# Patient Record
Sex: Female | Born: 1950 | Race: White | Hispanic: No | Marital: Married | Smoking: Former smoker
Health system: Southern US, Community
[De-identification: ages and names within clinical notes are randomized; demographics above are authoritative.]

## PROBLEM LIST (undated history)

## (undated) DIAGNOSIS — Z9889 Other specified postprocedural states: Secondary | ICD-10-CM

## (undated) DIAGNOSIS — M419 Scoliosis, unspecified: Secondary | ICD-10-CM

## (undated) DIAGNOSIS — C50919 Malignant neoplasm of unspecified site of unspecified female breast: Secondary | ICD-10-CM

## (undated) DIAGNOSIS — J449 Chronic obstructive pulmonary disease, unspecified: Secondary | ICD-10-CM

## (undated) DIAGNOSIS — I10 Essential (primary) hypertension: Secondary | ICD-10-CM

## (undated) DIAGNOSIS — J189 Pneumonia, unspecified organism: Secondary | ICD-10-CM

## (undated) DIAGNOSIS — J069 Acute upper respiratory infection, unspecified: Secondary | ICD-10-CM

## (undated) DIAGNOSIS — C801 Malignant (primary) neoplasm, unspecified: Secondary | ICD-10-CM

## (undated) DIAGNOSIS — R05 Cough: Secondary | ICD-10-CM

## (undated) DIAGNOSIS — R112 Nausea with vomiting, unspecified: Secondary | ICD-10-CM

## (undated) HISTORY — PX: BRONCHOSCOPY: SUR163

## (undated) HISTORY — PX: EYE SURGERY: SHX253

## (undated) HISTORY — PX: URETHRA SURGERY: SHX824

---

## 1978-09-16 HISTORY — PX: THYROID SURGERY: SHX805

## 1998-12-21 ENCOUNTER — Other Ambulatory Visit: Admission: RE | Admit: 1998-12-21 | Discharge: 1998-12-21 | Payer: Self-pay | Admitting: Unknown Physician Specialty

## 1999-12-20 ENCOUNTER — Encounter: Payer: Self-pay | Admitting: Obstetrics and Gynecology

## 1999-12-20 ENCOUNTER — Encounter: Admission: RE | Admit: 1999-12-20 | Discharge: 1999-12-20 | Payer: Self-pay | Admitting: Obstetrics and Gynecology

## 1999-12-21 ENCOUNTER — Ambulatory Visit (HOSPITAL_COMMUNITY): Admission: RE | Admit: 1999-12-21 | Discharge: 1999-12-21 | Payer: Self-pay | Admitting: Obstetrics and Gynecology

## 1999-12-21 ENCOUNTER — Encounter: Payer: Self-pay | Admitting: Obstetrics and Gynecology

## 2001-01-06 ENCOUNTER — Other Ambulatory Visit: Admission: RE | Admit: 2001-01-06 | Discharge: 2001-01-06 | Payer: Self-pay | Admitting: Obstetrics and Gynecology

## 2001-01-22 ENCOUNTER — Encounter: Admission: RE | Admit: 2001-01-22 | Discharge: 2001-01-22 | Payer: Self-pay | Admitting: Obstetrics and Gynecology

## 2001-01-22 ENCOUNTER — Encounter: Payer: Self-pay | Admitting: Obstetrics and Gynecology

## 2001-01-28 ENCOUNTER — Encounter: Payer: Self-pay | Admitting: Obstetrics and Gynecology

## 2001-01-28 ENCOUNTER — Encounter: Admission: RE | Admit: 2001-01-28 | Discharge: 2001-01-28 | Payer: Self-pay | Admitting: Obstetrics and Gynecology

## 2002-01-21 ENCOUNTER — Other Ambulatory Visit: Admission: RE | Admit: 2002-01-21 | Discharge: 2002-01-21 | Payer: Self-pay | Admitting: Obstetrics and Gynecology

## 2002-01-29 ENCOUNTER — Encounter: Admission: RE | Admit: 2002-01-29 | Discharge: 2002-01-29 | Payer: Self-pay | Admitting: Obstetrics and Gynecology

## 2002-01-29 ENCOUNTER — Encounter: Payer: Self-pay | Admitting: Obstetrics and Gynecology

## 2002-02-03 ENCOUNTER — Encounter: Payer: Self-pay | Admitting: Obstetrics and Gynecology

## 2002-02-03 ENCOUNTER — Encounter: Admission: RE | Admit: 2002-02-03 | Discharge: 2002-02-03 | Payer: Self-pay | Admitting: Obstetrics and Gynecology

## 2002-04-06 ENCOUNTER — Encounter: Admission: RE | Admit: 2002-04-06 | Discharge: 2002-04-06 | Payer: Self-pay | Admitting: Family Medicine

## 2002-04-06 ENCOUNTER — Encounter: Payer: Self-pay | Admitting: Family Medicine

## 2002-04-19 ENCOUNTER — Encounter: Payer: Self-pay | Admitting: Urology

## 2002-04-19 ENCOUNTER — Ambulatory Visit (HOSPITAL_COMMUNITY): Admission: RE | Admit: 2002-04-19 | Discharge: 2002-04-19 | Payer: Self-pay | Admitting: Urology

## 2002-04-19 ENCOUNTER — Encounter (INDEPENDENT_AMBULATORY_CARE_PROVIDER_SITE_OTHER): Payer: Self-pay | Admitting: *Deleted

## 2002-04-26 ENCOUNTER — Encounter (INDEPENDENT_AMBULATORY_CARE_PROVIDER_SITE_OTHER): Payer: Self-pay | Admitting: *Deleted

## 2002-04-26 ENCOUNTER — Ambulatory Visit (HOSPITAL_COMMUNITY): Admission: RE | Admit: 2002-04-26 | Discharge: 2002-04-26 | Payer: Self-pay | Admitting: Internal Medicine

## 2002-05-05 ENCOUNTER — Ambulatory Visit (HOSPITAL_COMMUNITY): Admission: RE | Admit: 2002-05-05 | Discharge: 2002-05-05 | Payer: Self-pay | Admitting: Urology

## 2002-05-05 ENCOUNTER — Encounter: Payer: Self-pay | Admitting: Urology

## 2002-05-24 ENCOUNTER — Encounter (INDEPENDENT_AMBULATORY_CARE_PROVIDER_SITE_OTHER): Payer: Self-pay

## 2002-05-24 ENCOUNTER — Ambulatory Visit (HOSPITAL_BASED_OUTPATIENT_CLINIC_OR_DEPARTMENT_OTHER): Admission: RE | Admit: 2002-05-24 | Discharge: 2002-05-24 | Payer: Self-pay | Admitting: Urology

## 2002-05-24 ENCOUNTER — Encounter (INDEPENDENT_AMBULATORY_CARE_PROVIDER_SITE_OTHER): Payer: Self-pay | Admitting: Specialist

## 2002-06-14 ENCOUNTER — Encounter (INDEPENDENT_AMBULATORY_CARE_PROVIDER_SITE_OTHER): Payer: Self-pay | Admitting: Specialist

## 2002-06-14 ENCOUNTER — Inpatient Hospital Stay (HOSPITAL_COMMUNITY): Admission: RE | Admit: 2002-06-14 | Discharge: 2002-06-16 | Payer: Self-pay | Admitting: Urology

## 2003-01-17 ENCOUNTER — Other Ambulatory Visit: Admission: RE | Admit: 2003-01-17 | Discharge: 2003-01-17 | Payer: Self-pay | Admitting: Obstetrics and Gynecology

## 2003-01-20 ENCOUNTER — Encounter: Payer: Self-pay | Admitting: Obstetrics and Gynecology

## 2003-01-20 ENCOUNTER — Encounter: Admission: RE | Admit: 2003-01-20 | Discharge: 2003-01-20 | Payer: Self-pay | Admitting: Obstetrics and Gynecology

## 2004-02-18 ENCOUNTER — Emergency Department (HOSPITAL_COMMUNITY): Admission: EM | Admit: 2004-02-18 | Discharge: 2004-02-18 | Payer: Self-pay | Admitting: Emergency Medicine

## 2004-08-17 ENCOUNTER — Ambulatory Visit (HOSPITAL_COMMUNITY): Admission: RE | Admit: 2004-08-17 | Discharge: 2004-08-17 | Payer: Self-pay | Admitting: Gastroenterology

## 2004-08-17 ENCOUNTER — Encounter (INDEPENDENT_AMBULATORY_CARE_PROVIDER_SITE_OTHER): Payer: Self-pay | Admitting: *Deleted

## 2005-01-23 ENCOUNTER — Encounter: Admission: RE | Admit: 2005-01-23 | Discharge: 2005-01-23 | Payer: Self-pay | Admitting: Family Medicine

## 2005-04-05 ENCOUNTER — Other Ambulatory Visit: Admission: RE | Admit: 2005-04-05 | Discharge: 2005-04-05 | Payer: Self-pay | Admitting: Obstetrics and Gynecology

## 2005-05-09 ENCOUNTER — Encounter: Admission: RE | Admit: 2005-05-09 | Discharge: 2005-05-09 | Payer: Self-pay | Admitting: Obstetrics and Gynecology

## 2006-05-12 ENCOUNTER — Encounter: Admission: RE | Admit: 2006-05-12 | Discharge: 2006-05-12 | Payer: Self-pay | Admitting: Obstetrics and Gynecology

## 2006-05-26 ENCOUNTER — Other Ambulatory Visit: Admission: RE | Admit: 2006-05-26 | Discharge: 2006-05-26 | Payer: Self-pay | Admitting: Obstetrics and Gynecology

## 2007-05-14 ENCOUNTER — Encounter: Admission: RE | Admit: 2007-05-14 | Discharge: 2007-05-14 | Payer: Self-pay | Admitting: Obstetrics and Gynecology

## 2007-06-22 ENCOUNTER — Ambulatory Visit (HOSPITAL_COMMUNITY): Admission: RE | Admit: 2007-06-22 | Discharge: 2007-06-22 | Payer: Self-pay | Admitting: Obstetrics and Gynecology

## 2008-01-22 ENCOUNTER — Encounter: Admission: RE | Admit: 2008-01-22 | Discharge: 2008-01-22 | Payer: Self-pay | Admitting: Family Medicine

## 2008-06-02 ENCOUNTER — Encounter: Admission: RE | Admit: 2008-06-02 | Discharge: 2008-06-02 | Payer: Self-pay | Admitting: Obstetrics and Gynecology

## 2009-07-13 ENCOUNTER — Encounter: Admission: RE | Admit: 2009-07-13 | Discharge: 2009-07-13 | Payer: Self-pay | Admitting: Family Medicine

## 2010-10-04 ENCOUNTER — Encounter
Admission: RE | Admit: 2010-10-04 | Discharge: 2010-10-04 | Payer: Self-pay | Source: Home / Self Care | Attending: Obstetrics and Gynecology | Admitting: Obstetrics and Gynecology

## 2010-10-16 ENCOUNTER — Ambulatory Visit: Payer: Self-pay | Admitting: Cardiovascular Disease

## 2011-02-01 NOTE — Op Note (Signed)
NAME:  Valerie Mosley, Valerie Mosley                       ACCOUNT NO.:  [id number]   MEDICAL RECORD NO.:  [id number]                   PATIENT TYPE:  INP   LOCATION:  X005                                 FACILITY:  [facility]   PHYSICIAN:  Mark C. Vernie Ammons, M.D.               DATE OF BIRTH:  March 20, 1951   DATE OF PROCEDURE:  06/14/2002  DATE OF DISCHARGE:                                 OPERATIVE REPORT   PREOPERATIVE DIAGNOSIS:  Left ureteral obstruction.   POSTOPERATIVE DIAGNOSES:  1. Left ureteral obstruction.  2. Pelvic mass.   PROCEDURES:  1. Left distal ureterectomy.  2. Biopsy of pelvic mass.  3. Left ureteral reimplantation.   SURGEON:  Mark C. Vernie Ammons, M.D.   ASSISTANT:  Crecencio Mc, MD   ANESTHESIA:  General.   ESTIMATED BLOOD LOSS:  700 cc.   INTRAVENOUS FLUIDS:  3500 cc.   INTRAOPERATIVE FINDINGS:  Left distal ureteral margin was negative for any  malignancy.  Frozen section biopsies of pelvic mass revealed benign  endometrial tissue.   COMPLICATIONS:  None.   INDICATION:  Ms. Dolliver is a 60 year old white female, who was initially  evaluated after an incidental finding of asymptomatic left-sided  hydronephrosis.  The patient subsequently had a CAT scan which revealed left  hydronephrosis and a dilated ureter down to the distal ureter.  However,  there were no masses identified that would suggest extrinsic compression.  The patient therefore underwent left ureteroscopy and multiple biopsies and  brushings demonstrated no evidence of malignancy.  In addition, a renogram  was performed to assess function of the kidney, and it revealed  approximately 20%. of relative renal function on the left.  After discussing  options, it was decided to proceed with left distal ureterectomy and  reimplantation.  Potential risks and benefits of this procedure were  explained to the patient, and she consented.   DESCRIPTION OF PROCEDURE:  The patient was taken to the operating room, and  a general anesthetic was administered.  The patient was given preoperative  antibiotics, placed in the supine position but slightly frog-legged.  Her  lower abdomen and genitalia were then prepped and draped in the usual  sterile fashion.  Next, a 24 French Foley catheter was inserted into the  bladder, and the bladder was drained.  A lower abdominal midline incision  was made from a point just below the umbilicus down to the pubic symphysis.  This was then carried down through the subcutaneous tissues with Bovie  electrocautery.  The anterior rectus fascia was identified and divided in  the midline.  The rectus muscles were then separated, and the underlying  transversalis fascia was entered thereby exposing the space of Retzius which  was developed bluntly on the left side of the bladder.  The large dilated  left ureter was then identified as it coursed over the iliac vessels.  A  right-angle was able to be used to  isolate the ureter, and a Vesi-loop was  placed underneath it.  The ureter was then freed up down to the  ureterovesical junction.  It was noted to be involved with a hard mass down  at the level of the UVJ.  The mass was palpated intraoperatively appeared  hard, irregular, and measuring approximately 4 x 3 cm.  After dissection of  the ureter using blunt and sharp dissection as necessary, it was seen that  the ureter was partly involved in this mass, although most of this mass  appeared to be posterior to the ureter.  Although the mass was firm, it did  appear to be mobile.  Previous pelvic examinations had been negative for any  masses.  However, due to this intraoperative finding, it was decided to  perform another bimanual exam.  Intravaginal exam did reveal evidence of an  irregular mass in the left fornix of the vagina just adjacent to the cervix.  This mass did appear to involve the vaginal mucosa.  A rectal exam was also  performed, and there did not appear to be any  rectal masses noted.  It was  therefore decided to perform a distal ureteral resection at this point as  well as a biopsy of this pelvic mass.  Therefore, the distal ureter was  transected at a point proximal to the aforementioned mass.  A distal  ureteral margin was sent for frozen section pathology.  An anterior  cystotomy incision was then made in the bladder, and the left ureteral  orifice was identified.  A 6 French end-hole catheter was passed up the left  ureter over a Glidewire, and a 4-0 chromic holding stitch was placed through  the ureter and the stent.  The mucosa surrounding the ureteral orifice was  then scored with Bovie electrocautery.  The ureter was then dissected free  from the bladder until it was completely freed from the bladder.  The  aforementioned mass could be seen to be involving the posterior ureter at a  point just proximal to the UVJ.  It was decided to remove the ureter as well  as the top portion of this mass which was sent for permanent section.  A  separate wedge biopsy was then taken of the underlying pelvic mass and sent  for frozen section.  The frozen section of the distal ureter returned  negative for malignancy.  In addition, the frozen section biopsy of the  pelvic mass revealed benign endometrial tissue.  At this point, it was  decided to obtain a telephone consultation from gynecology regarding the  appropriate next step.  It was decided that if this did appear to be benign  endometrial tissue, possibly suggesting endometriosis, no further surgical  resection was necessary at this time.  Therefore, it was decided to proceed  with left ureteral reimplantation.  The left ureteral orifice hiatus was  closed in two layers with a running 2-0 chromic and then a running 3-0  chromic suture.  A site superior to the native ureteral orifice was then  selected for reimplantation.  The bladder mucosa was scored, and a tonsil was passed through the detrusor  muscle.  The ureter was then brought through  this neoureteral hiatus, and ureteral reimplantation was performed.  Due to  the dilation of the ureter, it was not necessary to spatulate the distal  ureter.  A full-thickness 4-0 chromic suture was placed at the inferior  portion of the ureter to properly align it in its  new site in the bladder.  A second full-thickness 4-0 chromic suture was also placed just next to the  initial stitch.  Then 4-0 chromic interrupted sutures were then used to  perform the ureteral reimplantation intravesically.  There appeared to be  good urine effluxing from the ureter following completion of the  anastomosis.  In addition, urine was seen from the right ureteral orifice as  well.  The cystotomy incision was then closed in two layers with two running  2-0 chromic sutures.  The bladder was then irrigated, and the bladder  appeared watertight.  There also did not appear to be any clots in the  bladder.  A #10 Blake drain was then placed in the pelvis and brought out  through a separate stab incision in the left lower quadrant and secured with  a silk suture.  The fascia was then closed with a running #1 PDS suture.  Staples were used to reapproximate the skin, and a dressing was placed.  There were no complications, and the patient appeared to tolerate the  procedure well.  She was able to be extubated and transferred to the  recovery unit is satisfactory condition.  Please note that Dr. Ihor Gully  was present and participated in the entire procedure.     Crecencio Mc, M.D.                          Veverly Fells. Vernie Ammons, M.D.   LB/MEDQ  D:  06/14/2002  T:  06/14/2002  Job:  [id number]   cc:   Talmadge Coventry, M.D.

## 2011-02-01 NOTE — Discharge Summary (Signed)
NAME:  Valerie Mosley, Valerie Mosley                       ACCOUNT NO.:  [id number]   MEDICAL RECORD NO.:  [id number]                   PATIENT TYPE:  INP   LOCATION:  0358                                 FACILITY:  [facility]   PHYSICIAN:  Mark C. Vernie Ammons, M.D.               DATE OF BIRTH:  1951-08-25   DATE OF ADMISSION:  06/14/2002  DATE OF DISCHARGE:  06/16/2002                                 DISCHARGE SUMMARY   PRINCIPAL DIAGNOSIS:  Left hydronephrosis and ureteral obstruction secondary  to endometriosis.   OTHER DIAGNOSES:  1. Hypothyroidism.  2. Mild stress incontinence.   MAJOR OPERATION:  Left distal ureterectomy, biopsy of pelvic mass, and  ureteral reimplantation.   DISPOSITION:  The patient is discharged home in stable, satisfactory, and  improved condition.   FOLLOW-UP:  Her follow-up will be in my office approximately one week from  her date of surgery for skin staple and Foley catheter removal after  cystogram.   ACTIVITY:  Will be limited to no heavy lifting, straining, vigorous  activity, or up and down stairs, or driving.   DIET:  Unrestricted.   DISCHARGE MEDICATIONS:  1. Tylox for pain #36.  2. Ditropan XL 15 mg for bladder spasms q.d. as needed.  3. Colace 100 mg b.i.d. p.r.n.  4. Cipro XR 500 mg q.d.   BRIEF HISTORY:  The patient is a 60 year old white female who was seen in  office consultation after hydronephrosis was found on a CT scan that was  scanned through the tops of the kidneys checking for source of hemoptysis.  This was followed up by a CT which revealed hydronephrosis and hydroureter  down to the UVJ, but no mass could be identified in that region.  She had  retrograde pyelograms revealing what appeared to be a filling defect and  obstruction, followed by two sets of biopsies of the lesion, both which were  negative, __________ urine which was essentially negative, as were brush  biopsies.  She had dilatation of the stricture of the area and,  despite a  large 14-French stent, once it was removed she redeveloped hydronephrosis  quickly.  She, therefore, is admitted for distal left ureterectomy to help  determine the etiology of her obstruction and reimplantation.  She did have  a renal scan to assess function, which only revealed 20% of function on the  left-hand side; however, there appeared to be excellent renal parenchyma  present indicating possible further recovery once obstruction was resolved.   Her complete history and physical are previously dictated and will not be  repeated at this time.   HOSPITAL COURSE:  On June 14, 2002, she was taken to the OR and  underwent a left distal ureterectomy.  At the time of surgery a pelvic mass  was identified, and a biopsy of this mass revealed benign and endometrial  tissue.  Intraoperative consultation was obtained by telephone with Dr.  Elana Alm  since the patient's gynecologist was not known.  The feeling was  that if this represented endometriosis, which it most likely did, during  reimplantation in this postmenopausal patient should be curetted and no  further resection of the lesion indicated.  She tolerated the procedure well  with no need for transfusion and the night of surgery was found to be doing  well.  Her urine was slightly pink but had no clots.  The following day she  remained afebrile, with good urine output.  The pelvic drain was left in  place and had minimal output.  Her diet was advanced, and by her second  postoperative day, her drain output had decreased, and the drain was  removed.  Her urine was partially clear and had no clots.  Her wound was  healing well, with no signs of infection.  Her pathology revealed no  abnormality of the distal ureter.  The distal ureteral margin and the  biopsies of the pelvic mass revealed no malignancy but endometriosis only.  The segment of left distal ureter was found to contain endometriosis as the  cause of her  obstruction.  She was felt ready for discharge at that time  and, after discussing her pathology report with her, she was discharged  home.                                               Mark C. Vernie Ammons, M.D.    MCO/MEDQ  D:  06/25/2002  T:  06/25/2002  Job:  [id number]

## 2011-02-01 NOTE — Op Note (Signed)
NAME:  Valerie Mosley, Valerie Mosley NO.:  [id number]   MEDICAL RECORD NO.:  [id number]          PATIENT TYPE:  AMB   LOCATION:  ENDO                         FACILITY:  [facility]   PHYSICIAN:  Anselmo Rod, M.D.  DATE OF BIRTH:  Jan 27, 1951   DATE OF PROCEDURE:  08/17/2004  DATE OF DISCHARGE:                                 OPERATIVE REPORT   PROCEDURE:  Colonoscopy with snare polypectomy x 3 and cold biopsies x 6.   ENDOSCOPIST:  Charna Elizabeth, M.D.   INSTRUMENT USED:  Olympus video colonoscope.   INDICATIONS FOR PROCEDURE:  60 year old white female undergoing screening  colonoscopy to rule out colonic polyps, masses, etc.  The 10% missed rate  including all the risks and benefits of the procedure were discussed with  the patient.   PREPROCEDURE PREPARATION:  Informed consent was obtained from the patient.  The patient was fasted for eight hours prior to the procedure and prepped  with a bottle of magnesium citrate and a gallon of GoLYTELY the night prior  to the procedure.   PREPROCEDURE PHYSICAL:  Patient with stable vital signs.  Neck supple.  Chest clear to auscultation.  S1 and S2 regular.  Abdomen soft with normal  bowel sounds.   DESCRIPTION OF PROCEDURE:  The patient was placed in the left lateral  decubitus position, sedated with 80 mg of Demerol and 10 mg Versed in slow  incremental doses.  Once the patient was adequately sedated, maintained on  low flow oxygen and continuous cardiac monitoring, the Olympus video  colonoscope was advanced from the rectum to the cecum.  The appendiceal  orifice and ileocecal valve were clearly visualized and photographed.  There  was a significant amount of residual stool in the colon and the prep was  suboptimal.  Multiple washes were done.  The terminal ileum appeared healthy  and without lesions.  Two small sessile polyps were snared from the  rectosigmoid colon, one polyp was biopsied from the rectosigmoid colon.  A  small  flat polyp was snared from the rectum, five small sessile polyps were  biopsied from the rectum, as well.  Small internal hemorrhoids were seen on  retroflexion.  There was evidence of diverticula throughout the colon with  more prominent changes in the left colon.  Inspissated stool was seen in  several of the diverticula in the left colon.  The patient tolerated the  procedure well without complications.   IMPRESSION:  1.  Pandiverticulosis, left more than right.  2.  Multiple polyps snared and biopsied from the rectosigmoid colon (see      description above).  3.  Small internal hemorrhoids.  4.  Normal terminal ileum.   RECOMMENDATIONS:  1.  Await pathology results.  2.  Avoid nonsteroidals including aspirin for the next four weeks.  3.  Outpatient follow in the next two weeks for further recommendations.      Brochures on diverticulosis have been given to the patient for      education.  Further recommendations will be made in follow up.      Jyot   JNM/MEDQ  D:  08/17/2004  T:  08/17/2004  Job:  [id number]   cc:   Talmadge Coventry, M.D.  [address]  [place]  [place] [address]  Fax: [phone]   Huel Cote, M.D.  [address] [place], [address]  [place], [place] [address]  Fax: [phone]

## 2011-02-01 NOTE — H&P (Signed)
NAME:  Valerie Mosley, SCHNURR                       ACCOUNT NO.:  [id number]   MEDICAL RECORD NO.:  [id number]                   PATIENT TYPE:  INP   LOCATION:  NA                                   FACILITY:  [facility]   PHYSICIAN:  Mark C. Vernie Ammons, M.D.               DATE OF BIRTH:  September 02, 1951   DATE OF ADMISSION:  06/14/2002  DATE OF DISCHARGE:                                HISTORY & PHYSICAL   HISTORY OF PRESENT ILLNESS:  The patient is a 60 year old white female  patient who was seen in office consultation for further evaluation of an  abnormality of her left ureter found incidentally during workup for  hemoptysis.  A CT was performed for that, and included the kidneys which  revealed left hydronephrosis.  A followup ultrasound and CT scan confirmed a  completely asymptomatic left hydronephrosis.  The patient has not had any  hematuria.  She had one urinary tract infection in her 20's, and denies any  irritative or obstructive voiding symptoms.  She has mild stress  incontinence.  On review of her CT and ultrasound I noted dilatation of the  renal pelvis which was noted to be extra-renal, with what I thought was  thinning of the cortex initially.  In order to evaluate this, she was  brought to the OR and had a retrograde pyelogram and ureteroscopy.  I did  biopsies and washings, but the biopsies showed no evidence of urothelial  malignancy, and the washings showed some atypia with numerous papillary  groups with some cytologic atypia, suspicious, but not diagnostic for a  papillary urothelial process.  I did find an area in the distal ureter that  appeared to be heaped up, but was covered with what appeared to be normal  mucosa.  I had left an ureteral stent in place, and performed a renal  ultrasound that showed resolution of her hydronephrosis and good parenchyma  measuring 9.2 mm in width.  With the stent in place, I performed a MAG-3  renogram which revealed 20% of function on the  left, however, by ultrasound  she appeared to have a much better appearing kidney.  I therefore offered  her excision of the distal ureter and reimplantation as I thought she would  likely re-obstruct, however, she wanted to be absolutely sure that was  necessary, so I took her back to the OR, repeated biopsies, and did much  more extensive brushing and barbotraging of the ureter, but this again did  not turn up any definitely malignancy.  I dilated the distal ureter, and I  put in a 14 French endopyelotomy stent.  When I removed her endopyelotomy  stent I obtained an ultrasound which showed no hydronephrosis, and then when  she returned her hydronephrosis had recurred.  Because she continues to  obstruct due to some distal ureteral process as yet to be determined as far  as the etiology, we discussed  distal ureterectomy and reimplantation of the  ureter since I do feel that she has enough renal function worthy of salvage.  She is admitted today for that procedure.   PAST MEDICAL HISTORY:  Partial thyroidectomy, and as above.  The partial  thyroidectomy resulted in some mild hypothyroidism for which she takes  Synthroid at 0.05 mg, and she is on Estrogen replacement using Prempro 2.5  mg q.d.  She is otherwise in good health, and has some mild stress  incontinence.   ALLERGIES:  No known drug allergies.   SOCIAL HISTORY:  She smoked 1 pack of cigarettes a day for 20 years, and  continues to do so, and occasionally drinks beer.   FAMILY HISTORY:  Father died of cancer at age 41, mother of an aneurysm at  67, and there is prostate cancer in her family.   REVIEW OF SYMPTOMS:  Positive for some vaginal bleeding, incontinence,  cough, tired, and sluggish, and otherwise per health history section in her  chart which was reviewed and signed.   PHYSICAL EXAMINATION:  GENERAL:  The patient is a well-developed, well-  nourished white female in no acute distress.  VITAL SIGNS:  Temperature is  99.4, pulse 74, respirations 16, blood pressure  104/72.  HEENT:  Normocephalic, atraumatic.  Oropharynx is clear.  NECK:  Supple without JVD or adenopathy.  CHEST:  Clear to auscultation.  CARDIOVASCULAR:  Regular rate and rhythm.  ABDOMEN:  Soft and nontender without masses or hepatosplenomegaly.  She had  no inguinal hernias or adenopathy.  No flank tenderness.  GENITALIA:  Normal with normally placed urethral meatus and mild grade I  cystocele, no rectocele or masses, no introital lesions.  The bladder base  had no palpable abnormalities or tenderness.  No periurethral lesions.  Cervix is palpably normal with a normal anus and perineum.  EXTREMITIES:  Without cyanosis, clubbing, or edema.  SKIN:  Warm and dry.  NEUROLOGIC:  She is alert and oriented with appropriate mood and affect with  no gross focal neurologic deficits.   LABORATORY DATA:  Hemoglobin is 11.6, hematocrit 40.   Chest x-ray revealed atelectasis at the left lung base with some chronic  interstitial lung disease, otherwise negative.   IMPRESSION:  1. Distal left ureteral obstruction suspicious, but she has not had any     diagnosis of transitional cell carcinoma.  She is at risk because of her     history of smoking.  Regardless of the fact that I have not been able to     diagnose malignancy in causing the obstruction, she has a salvageable     left renal unit, as far as function goes, and needs reimplantation, as     she does not want to have a chronic indwelling stent requiring stent     changes.  We have discussed this at length, and she understands and would     like to proceed with this.  I have gone over the risks and complications     with her, and she understands these as well.  2. She has mild stress urinary incontinence and a mild cystocele of no real     significance at this time.   PLAN: 1. Routine deep vein thrombosis and pulmonary embolism prophylaxis.  2. Perioperative antibiotics.  3. She is  going to undergo distal left ureterectomy with excision of the     distal ureteral segment and reimplantation.  Mark C. Vernie Ammons, M.D.    MCO/MEDQ  D:  06/14/2002  T:  06/14/2002  Job:  [id number]   cc:   Talmadge Coventry, M.D.

## 2011-02-01 NOTE — Op Note (Signed)
NAME:  Valerie Mosley, Valerie Mosley                       ACCOUNT NO.:  [id number]   MEDICAL RECORD NO.:  [id number]                   PATIENT TYPE:  AMB   LOCATION:  ENDO                                 FACILITY:  [facility]   PHYSICIAN:  Clinton D. Maple Hudson, M.D.              DATE OF BIRTH:  [birth year]   DATE OF PROCEDURE:  04/26/2002  DATE OF DISCHARGE:  04/26/2002                                 OPERATIVE REPORT   PROCEDURE:  Bronchoscopy.   INDICATIONS:  A one pack per day smoker with new-onset hemoptysis.  Chest x-  ray and CT scan show hazy infiltrate left upper lobe and left base without  effusion, mass, or other significant pathology.  Possible mild atelectasis,  left base.  Medical history:  Hypertension, hypothyroidism.  Question of  partial obstruction, left kidney.  Preoperative physical examination:  BP  110/80, weight 129 pounds.  Oxygen saturation on room air 96%.  Moderate  obstructive airway disease indicated by pulmonary function tests.  There  were mild rhonchi, especially in the left midlung zone.  Heart sounds were  normal.  No adenopathy, obvious hemorrhage, or other abnormalities  recognized.  Medication:  Advil, Synthroid, Prempro.  No medication allergy.  She is considered stable for the procedure, intending to look for cause of  hemoptysis.   DESCRIPTION OF PROCEDURE:  After fully informed consent, bronchoscopy was  performed in the endoscopy suite on an outpatient basis.  Premedication was  with Demerol and atropine.  The upper airway was anesthetized topically with  Hurricaine spray and 1%  Xylocaine.  Oxygen was provided at 8 L/min. by  nasal prongs, holding saturation over 90%.  Cardiac monitor showed normal  sinus rhythm.  An Olympus fiberoptic bronchoscope was advanced via the right  nostril to the level of the vocal cords without difficulty.  The cords moved  normally.  The arytenoid cartilages were distinctly red, raising question of  reflux.  Otherwise, the  nasopharynx and larynx were unremarkable.  The  trachea and main carina were normal.  Sequentially the examination of each  lobar and segmental airway bilaterally to the fourth division level revealed  no endobronchial lesions.  There were scattered areas of retained clear  mucoid secretion but the bronchial mucosa was only  minimal erythematous,  suggesting some areas of mild bronchitis.  No blood was seen and no  potential bleeding source recognized.  The left apex and left lower lobe  were lavaged with saline and brushed for cytology.  No biopsies were  attempted.  The bronchoscope was then removed.  There were no apparent  complications, and she seemed to be doing well.  She will be held until  stable for recovery and then return home per the family to office follow-up.   FINAL IMPRESSION:  Bronchitis with hemoptysis.  Clinton D. Maple Hudson, M.D.    CDY/MEDQ  D:  04/26/2002  T:  04/28/2002  Job:  [id number]   cc:   Talmadge Coventry, M.D.

## 2011-02-01 NOTE — Op Note (Signed)
TNAMEFREDRICA, Valerie Mosley                      ACCOUNT NO.:  [id number]   MEDICAL RECORD NO.:  [id number]                   PATIENT TYPE:  AMB   LOCATION:  DAY                                  FACILITY:  [facility]   PHYSICIAN:  Mark C. Vernie Ammons, M.D.               DATE OF BIRTH:  [birth year]   DATE OF PROCEDURE:  04/19/2002  DATE OF DISCHARGE:                                 OPERATIVE REPORT   PREOPERATIVE DIAGNOSIS:  Left hydronephrosis.   POSTOPERATIVE DIAGNOSES:  1. Left hydronephrosis.  2. Left ureteral stricture.   PROCEDURE:  Cystoscopy, left retrograde pyelogram with interpretation, left  ureteroscopy with dilation of ureteral stricture and ureteral biopsy  including double J stent placement.   SURGEON:  Mark C. Vernie Ammons, M.D.   ANESTHESIA:  General.   DRAINS:  6 French 26 cm double J stent in the left ureter.   SPECIMENS:  Urine from left ureter sent for cytology and ureteral biopsy.   ESTIMATED BLOOD LOSS:  Less than 1 cc.   COMPLICATIONS:  None.   INDICATIONS FOR PROCEDURE:  The patient is a 60 year old white female who,  during her workup for hemoptysis, was found to have left hydronephrosis.  This was followed up and found to be associated with significant  hydronephrosis the presence of functioning renal parenchyma on that side  with dilation of the ureter down to the level of the UVJ. No extrinsic mass  could be identified. She was brought to the OR today for further  investigation of the cause of her obstruction and treatment. She understands  the risks, complications, alternatives and limitations.   DESCRIPTION OF PROCEDURE:  After informed consent, the patient was brought  to the major OR, placed on the table, administered general anesthesia and  then moved to the dorsal lithotomy position. Her genitalia was sterilely  prepped and draped and I initially inserted the 22 French rigid cystoscope  with the 12 degree lens and fully inspected the bladder and noted  the  bladder to be lined with normal pink mucosa that was free of any tumor,  stones or inflammatory lesions. Specifically the ureteral orifices appeared  symmetric in placement and configuration.   An 8 [place] cone-tip ureteral catheter was then passed through the  cystoscope and a retrograde pyelogram was performed under direct  fluoroscopic control. It revealed what appeared to be a segment of normal  ureter for about 2-3 cm followed by an area that appeared to almost have a  filling defect type of configuration, some slight dilatation above that  followed by marked hydronephrosis above that region. I then removed the cone-  tip ureteral catheter and passed a guidewire easily up through the area into  the area of the renal pelvis. Over the guidewire, a 10 cm length 15 French  ureteral dilating balloon was placed across the area and inflated to 14  atmospheres and remained inflated for five minutes. I then  deflated the  balloon, removed the ballon and guidewire and then inserted the 7 French  rigid short ureteroscope. What I noted was irritated mucosa as to be  expected with the dilatation and what appeared to be a nodule that  corresponded to the area of greatest stricture during balloon inflation. As  I inflated the balloon, I could see one area that opened up at the very  highest pressure and this area corresponded to what I saw ureteroscopically  as a small nodular appearing lesion that had no papillary component and  appeared to be covered with normal appearing mucosa. I therefore obtained  urine from the ureter and sent that for cytology. I then attempted to biopsy  the area with a 3 French biopsy forceps but I was unsuccessful so I removed  the 7 French ureteroscope and inserted a 9.5 French rigid ureteroscope. I  was able to easily identify the area and biopsied this. No bleeding was  noted after performing this biopsy and I then passed a guidewire up the  ureter into the pelvis  under fluoroscopic control and then reloaded the  cystoscope over the guidewire and passed the stent over the guidewire with  good curl being noted in the renal pelvis and bladder. The bladder was then  drained, the patient was awakened and taken to the recovery room in stable  satisfactory condition. She received a B&O suppository before being awakened  and tolerated the procedure well with no intraoperative complications.                                               Mark C. Vernie Ammons, M.D.    MCO/MEDQ  D:  04/19/2002  T:  04/23/2002  Job:  [id number]

## 2011-02-01 NOTE — Op Note (Signed)
Valerie Mosley, Valerie Mosley                      ACCOUNT NO.:  [id number]   MEDICAL RECORD NO.:  [id number]                   PATIENT TYPE:  AMB   LOCATION:  [facility]                                 FACILITY:  [facility]   PHYSICIAN:  Mark C. Vernie Ammons, M.D.               DATE OF BIRTH:  [birth year]   DATE OF PROCEDURE:  05/24/2002  DATE OF DISCHARGE:                                 OPERATIVE REPORT   PREOPERATIVE DIAGNOSIS:  Left ureteral obstruction with hydronephrosis and  stricture versus filling defect.   POSTOPERATIVE DIAGNOSES:  Left ureteral obstruction with hydronephrosis and  stricture versus filling defect.   PROCEDURE:  Cystoscopy, left retrograde pyelogram with interpretation, left  ureteroscopy, ureteral dilatation and biopsy of the ureteral lesion with  double J stent placement.   SURGEON:  Mark C. Vernie Ammons, M.D.   ANESTHESIA:  General.   DRAINS:  A 24 cm 7/14 endopyelotomy stent with string.   ESTIMATED BLOOD LOSS:  Approximately 1 cc.   SPECIMENS:  1. Left ureteral cold cup biopsies.  2. Left ureteral brush biopsy.  3. Left ureteral barbotage for cytology.   COMPLICATIONS:  None.   INDICATIONS FOR PROCEDURE:  The patient is a 60 year old white female found  to have incidental left hydronephrosis and evaluated previously with  ureteroscopy and biopsy of what appeared to be a filling defect in the  distal left ureter. I left the stent in after that procedure and all of the  pathologic studies revealed no malignancy. There was a question of some  urothelial atypia on cytology. She is brought back to the OR for  reevaluation, further biopsy with a larger scope now that she has had a  stent in and placement of a larger stent.   DESCRIPTION OF PROCEDURE:  After informed consent, the patient brought to  the major OR, placed on the table and administered general endotracheal  anesthesia and moved to the dorsal lithotomy position. Her genitalia was  sterilely prepped and  draped and initially the cystoscope was introduced in  the bladder, the bladder was noted to be free of any tumor, stones or  inflammatory lesions. The stent was noted to be exiting the left ureteral  orifice and was grasped with alligator forceps and brought out through the  urethral meatus. Next, a 0.038 inch floppy tip guidewire was then passed  through the stent up the left ureter under direct fluoroscopic control. Once  in the area of the renal pelvis, the stent was removed and with the  guidewire in place, the 9 French short rigid ureteroscope was then  introduced and easily passed into the left ureteral orifice but was held up  somewhat by a narrowing that would not allow the scope easy passage. I  therefore performed a retrograde pyelogram with the scope.   Retrograde pyelogram was performed in the fashion under direct fluoroscopic  control and I noted proximal hydronephrosis down to a filling defect  in the  distal ureter. This persisted throughout the studies. I therefore  photographed the area and then passed the cold cup biopsy forceps through  the ureteroscope and took four good biopsies of the papillary type lesion.  Of note, it appeared to be covered with normal appearing mucosa and did not  have a papillary transitional cell carcinoma type appearance. After several  biopsies were obtained and I felt that I had biopsied both the mucosa and  the underlying tissue, I then passed a brush biopsy through the ureteroscope  and brushed the deeper area to try to get some of those cells for  evaluation. Finally I used a 30 cc syringe attached to the ureteroscope and  barbotaged that area directing the flow of sterile saline over the lesion up  into the more proximal ureter draining and then reinjecting several times.  This urine was then sent for cytology.   I next chose an endopyelotomy stent and I had previously dilated the ureter  to 26 [place] with the dilating balloon in order to  get the 9 French  ureteroscope up to the area in question. With the ureter now dilated, I was  able to pass the endopyelotomy stent over the guidewire into the area of the  renal pelvis with the 14 French portion transversing the area of stricture  and removed the guidewire. Prior to passing the stent, I tied a 3-0 nylon  suture to the distal aspect of the stent to aid in its removal in my office.   The patient tolerated the procedure well. There were no intraoperative  complications and she was given a B&O suppository at the end of the  operation. She will be given a prescription for 30 Pyridium plus and 28  Vicodin ES and return to my office in one week for stent removal.                                                Mark C. Vernie Ammons, M.D.    MCO/MEDQ  D:  05/24/2002  T:  05/24/2002  Job:  [id number]

## 2011-07-29 ENCOUNTER — Emergency Department (HOSPITAL_COMMUNITY): Payer: BC Managed Care – PPO

## 2011-07-29 ENCOUNTER — Other Ambulatory Visit (HOSPITAL_COMMUNITY): Payer: BC Managed Care – PPO

## 2011-07-29 ENCOUNTER — Encounter: Payer: Self-pay | Admitting: *Deleted

## 2011-07-29 ENCOUNTER — Inpatient Hospital Stay (HOSPITAL_COMMUNITY)
Admission: EM | Admit: 2011-07-29 | Discharge: 2011-08-05 | DRG: 089 | Disposition: A | Discharge status: Home / Self Care | Payer: BC Managed Care – PPO | Attending: Pulmonary Disease | Admitting: Pulmonary Disease

## 2011-07-29 DIAGNOSIS — Z720 Tobacco use: Secondary | ICD-10-CM | POA: Diagnosis present

## 2011-07-29 DIAGNOSIS — J4489 Other specified chronic obstructive pulmonary disease: Secondary | ICD-10-CM | POA: Diagnosis present

## 2011-07-29 DIAGNOSIS — R0902 Hypoxemia: Secondary | ICD-10-CM

## 2011-07-29 DIAGNOSIS — J449 Chronic obstructive pulmonary disease, unspecified: Secondary | ICD-10-CM

## 2011-07-29 DIAGNOSIS — R059 Cough, unspecified: Secondary | ICD-10-CM

## 2011-07-29 DIAGNOSIS — F17200 Nicotine dependence, unspecified, uncomplicated: Secondary | ICD-10-CM | POA: Diagnosis present

## 2011-07-29 DIAGNOSIS — J984 Other disorders of lung: Secondary | ICD-10-CM | POA: Diagnosis present

## 2011-07-29 DIAGNOSIS — J189 Pneumonia, unspecified organism: Principal | ICD-10-CM | POA: Diagnosis present

## 2011-07-29 DIAGNOSIS — E876 Hypokalemia: Secondary | ICD-10-CM | POA: Diagnosis present

## 2011-07-29 DIAGNOSIS — M419 Scoliosis, unspecified: Secondary | ICD-10-CM | POA: Diagnosis present

## 2011-07-29 DIAGNOSIS — M4120 Other idiopathic scoliosis, site unspecified: Secondary | ICD-10-CM | POA: Diagnosis present

## 2011-07-29 DIAGNOSIS — R042 Hemoptysis: Secondary | ICD-10-CM

## 2011-07-29 DIAGNOSIS — E0789 Other specified disorders of thyroid: Secondary | ICD-10-CM | POA: Diagnosis present

## 2011-07-29 DIAGNOSIS — R918 Other nonspecific abnormal finding of lung field: Secondary | ICD-10-CM

## 2011-07-29 HISTORY — DX: Cough: R05

## 2011-07-29 HISTORY — DX: Cough, unspecified: R05.9

## 2011-07-29 HISTORY — DX: Essential (primary) hypertension: I10

## 2011-07-29 HISTORY — DX: Scoliosis, unspecified: M41.9

## 2011-07-29 HISTORY — DX: Chronic obstructive pulmonary disease, unspecified: J44.9

## 2011-07-29 LAB — CBC
HCT: 47.3 % — ABNORMAL HIGH (ref 36.0–46.0)
Hemoglobin: 16.5 g/dL — ABNORMAL HIGH (ref 12.0–15.0)
MCH: 31.6 pg (ref 26.0–34.0)
MCHC: 34.9 g/dL (ref 30.0–36.0)
MCV: 90.6 fL (ref 78.0–100.0)
Platelets: 329 10*3/uL (ref 150–400)
RBC: 5.22 MIL/uL — ABNORMAL HIGH (ref 3.87–5.11)
RDW: 13.1 % (ref 11.5–15.5)
WBC: 11.9 10*3/uL — ABNORMAL HIGH (ref 4.0–10.5)

## 2011-07-29 LAB — MAGNESIUM: Magnesium: 1.9 mg/dL (ref 1.5–2.5)

## 2011-07-29 LAB — COMPREHENSIVE METABOLIC PANEL
ALT: 16 U/L (ref 0–35)
AST: 20 U/L (ref 0–37)
Albumin: 4.3 g/dL (ref 3.5–5.2)
Alkaline Phosphatase: 156 U/L — ABNORMAL HIGH (ref 39–117)
BUN: 11 mg/dL (ref 6–23)
CO2: 23 mEq/L (ref 19–32)
Calcium: 9.5 mg/dL (ref 8.4–10.5)
Chloride: 105 mEq/L (ref 96–112)
Creatinine, Ser: 0.61 mg/dL (ref 0.50–1.10)
GFR calc Af Amer: >90 mL/min (ref >90)
GFR calc non Af Amer: >90 mL/min (ref >90)
Glucose, Bld: 88 mg/dL (ref 70–99)
Potassium: 3.5 mEq/L (ref 3.5–5.1)
Sodium: 141 mEq/L (ref 135–145)
Total Bilirubin: 0.3 mg/dL (ref 0.3–1.2)
Total Protein: 8.1 g/dL (ref 6.0–8.3)

## 2011-07-29 LAB — PROTIME-INR
INR: 1.02 (ref 0.00–1.49)
Prothrombin Time: 13.6 seconds (ref 11.6–15.2)

## 2011-07-29 LAB — DIFFERENTIAL
Basophils Absolute: 0 10*3/uL (ref 0.0–0.1)
Basophils Relative: 0 % (ref 0–1)
Eosinophils Absolute: 0.2 10*3/uL (ref 0.0–0.7)
Eosinophils Relative: 1 % (ref 0–5)
Lymphocytes Relative: 28 % (ref 12–46)
Lymphs Abs: 3.4 10*3/uL (ref 0.7–4.0)
Monocytes Absolute: 0.7 10*3/uL (ref 0.1–1.0)
Monocytes Relative: 6 % (ref 3–12)
Neutro Abs: 7.7 10*3/uL (ref 1.7–7.7)
Neutrophils Relative %: 64 % (ref 43–77)

## 2011-07-29 LAB — PHOSPHORUS: Phosphorus: 2.5 mg/dL (ref 2.3–4.6)

## 2011-07-29 LAB — APTT: aPTT: 37 seconds (ref 24–37)

## 2011-07-29 LAB — OCCULT BLOOD, POC DEVICE
Fecal Occult Bld: POSITIVE
Fecal Occult Bld: POSITIVE

## 2011-07-29 MED ORDER — ALBUTEROL SULFATE (5 MG/ML) 0.5% IN NEBU: 2.5000 mg | INHALATION_SOLUTION | RESPIRATORY_TRACT | Status: DC | PRN

## 2011-07-29 MED ORDER — DEXTROSE 5 % IV SOLN
1.0000 g | INTRAVENOUS | Status: DC
  Administered 2011-07-29: 1 g via INTRAVENOUS
  Filled 2011-07-29: qty 10

## 2011-07-29 MED ORDER — PANTOPRAZOLE SODIUM 40 MG PO TBEC: 40.0000 mg | DELAYED_RELEASE_TABLET | Freq: Every day | ORAL | Status: DC

## 2011-07-29 MED ORDER — DEXTROSE 5 % IV SOLN
500.0000 mg | INTRAVENOUS | Status: DC
  Filled 2011-07-29: qty 500

## 2011-07-29 MED ORDER — ACETAMINOPHEN 325 MG PO TABS: 650.0000 mg | ORAL_TABLET | Freq: Four times a day (QID) | ORAL | Status: DC | PRN

## 2011-07-29 MED ORDER — SODIUM CHLORIDE 0.9 % IV SOLN
INTRAVENOUS | Status: DC
  Administered 2011-07-29: 19:00:00 via INTRAVENOUS

## 2011-07-29 MED ORDER — SODIUM CHLORIDE 0.9 % IV SOLN: 250.0000 mL | INTRAVENOUS | Status: DC | PRN

## 2011-07-29 MED ORDER — AZITHROMYCIN 250 MG PO TABS: ORAL_TABLET | ORAL | Status: AC

## 2011-07-29 NOTE — ED Notes (Signed)
Pt states "my doctor sent me here, I was there this morning"

## 2011-07-29 NOTE — ED Notes (Signed)
Pt states "this happened at least 7-8 yrs ago when I was coughing up blood, woke up this a.m. Around 0200 and coughed up blood, was bright red blood, also had diarrhea this morning and it looked like tar"

## 2011-07-29 NOTE — ED Provider Notes (Addendum)
History     CSN: [id number] Arrival date & time: 07/29/2011  1:54 PM   First MD Initiated Contact with Patient 07/29/11 1516      Chief Complaint  Patient presents with  . Hemoptysis    (Consider location/radiation/quality/duration/timing/severity/associated sxs/prior treatment) HPI Pt woke up this am coughing up blood at 2am.  She also had a dark tarry stool.  Pt had another episode this am.  She went to see her doctor this am and was sent here for further evaluation.  Pt had an episode 7-8 years ago.  Pt denies vomiting of blood.  Pt does smoke cigarettes daily.  No pain.  Nothing makes it better or worse.  No nosebleeds.  No history of liver problems.  Pt drinks 1 or 2 beers every other day. Patient states since this morning around 9 AM she has not had any further episodes. Past Medical History  Diagnosis Date  . Hypertension     Past Surgical History  Procedure Date  . Urethra surgery   . Thyroid surgery     History reviewed. No pertinent family history.  History  Substance Use Topics  . Smoking status: Current Everyday Smoker -- 0.5 packs/day  . Smokeless tobacco: Not on file  . Alcohol Use: Yes     every other day    OB History    Grav Para Term Preterm Abortions TAB SAB Ect Mult Living                  Review of Systems  HENT: Negative for nosebleeds.   Respiratory: Negative for chest tightness and shortness of breath.   Gastrointestinal: Negative for blood in stool.  All other systems reviewed and are negative.    Allergies  Review of patient's allergies indicates no known allergies.  Home Medications   Current Outpatient Rx  Name Route Sig Dispense Refill  . AMLODIPINE BESYLATE 10 MG PO TABS Oral Take 10 mg by mouth daily.      Marland Kitchen DIAZEPAM 5 MG PO TABS Oral Take 5 mg by mouth daily as needed. Anxiety        BP 146/79  Pulse 101  Temp(Src) 98.9 F (37.2 C) (Oral)  Resp 19  Wt 131 lb (59.421 kg)  SpO2 95%  Physical Exam  Nursing note and  vitals reviewed. Constitutional: She appears well-developed and well-nourished. No distress.  HENT:  Head: Normocephalic and atraumatic.  Right Ear: External ear normal.  Left Ear: External ear normal.  Eyes: Conjunctivae are normal. Right eye exhibits no discharge. Left eye exhibits no discharge. No scleral icterus.  Neck: Neck supple. No tracheal deviation present.  Cardiovascular: Normal rate, regular rhythm and intact distal pulses.   Pulmonary/Chest: Effort normal and breath sounds normal. No stridor. No respiratory distress. She has no wheezes. She has no rales.  Abdominal: Soft. Bowel sounds are normal. She exhibits no distension. There is no tenderness. There is no rebound and no guarding.  Genitourinary: Rectum normal.  Musculoskeletal: She exhibits no edema and no tenderness.  Neurological: She is alert. She has normal strength. No sensory deficit. Cranial nerve deficit:  no gross defecits noted. She exhibits normal muscle tone. She displays no seizure activity. Coordination normal.  Skin: Skin is warm and dry. No rash noted.  Psychiatric: She has a normal mood and affect.    ED Course  Procedures (including critical care time)  Labs Reviewed  CBC - Abnormal; Notable for the following:    WBC 11.9 (*)  RBC 5.22 (*)    Hemoglobin 16.5 (*)    HCT 47.3 (*)    All other components within normal limits  COMPREHENSIVE METABOLIC PANEL - Abnormal; Notable for the following:    Alkaline Phosphatase 156 (*)    All other components within normal limits  OCCULT BLOOD, POC DEVICE  DIFFERENTIAL  PROTIME-INR  APTT  OCCULT BLOOD, POC DEVICE  POCT OCCULT BLOOD STOOL, DEVICE   Dg Chest 2 View  07/29/2011  *RADIOLOGY REPORT*  Clinical Data: Coughing up blood since this morning, slight fever  CHEST - 2 VIEW  Comparison: 01/22/2008; 06/22/2007  Findings:  Unchanged cardiac silhouette and mediastinal contours.  The lungs remain hyperinflated with persistent prominence of the pulmonary  interstitium.  No new focal airspace opacities.  There is persistent blunting of the bilateral costophrenic angles without definite pleural effusion.  Unchanged bones including accentuated kyphotic curvature of the upper/mid thoracic spine.  IMPRESSION:  Grossly stable emphysematous change without definite superimposed acute cardiopulmonary process.  Original Report Authenticated By: Waynard Reeds, M.D.   Ct Chest Wo Contrast  07/29/2011  *RADIOLOGY REPORT*  Clinical Data: Hemoptysis.  Smoker.  Previous thyroid surgery.  CT CHEST WITHOUT CONTRAST  Technique:  Multidetector CT imaging of the chest was performed following the standard protocol without IV contrast.  Comparison: Chest radiographs obtained earlier today.  Findings: The left lobe of the thyroid gland appears to be surgically absent.  There is a 1.0 cm poorly defined oval area of low density in an enlarged thyroid isthmus.  The right lobe also appears mildly enlarged and mildly heterogeneous.  Diffuse peribronchial thickening and bullous changes are demonstrated in both lungs.  There is also patchy interstitial prominence in the anterior right upper lobe.  There is minimal similar change in the medial right lower lobe.  There is also a 1.1 x 0.6 cm irregular density in the left upper lobe on image number 11.  This has some central lucency, on the axial images.  However, on the sagittal and coronal reconstructed images, this appears more homogeneously soft tissue density centrally and more mass-like.  There is also a 6 mm oval nodular density in the left lower lobe on image number 48.  No enlarged lymph nodes are seen.  The adrenal glands are unremarkable.  The included portion of the left kidney is somewhat small.  The right kidney is not included.  Multiple liver cysts are noted.  Accentuation of the dorsal kyphosis is noted as well as thoracic and cervical spine degenerative changes.  IMPRESSION:  1.  1.1 cm left upper lobe mass.  This is  concerning for the possibility of a small lung neoplasm. 2.  Patchy density in the right upper lobe and minimal patchy density in the right lower lobe, suspicious for active infection/inflammation. 3.  Centrilobular emphysema and chronic bronchitic changes. 4.  Status post left thyroidectomy with multinodular goiter involving the remainder of the thyroid gland, including a 1.0 cm probable nodule in the thyroid isthmus.  This could be better delineated with elective thyroid ultrasound. 5.  6 mm nonspecific left lower lobe nodule.  Original Report Authenticated By: Darrol Angel, M.D.     MDM  Patient presents with symptoms suggestive of hemoptysis. She did notice some dark stools however she specifically denies that she was vomiting this morning. Patient states she was coughing. Patient is hemodynamically stable. She has not had any further episodes since this morning. At this point I think she is stable for further  workup as an outpatient. I discussed the case with Dr. Cathi Roan from pulmonary and he will assist in arranging outpatient followup for this.   Diagnosis: Hemoptysis     Celene Kras, MD 07/29/11 1653  5:27 PM patient was getting ready for discharge when she started having a large amount of hemoptysis again. Patient's had several coughing episodes where she has coughed up large bright red blood as well as blood clots. He appears to be at least 40 cc of blood. I consulted Dr. Cathi Roan again. Someone from pulmonary who will come down to evaluate the patient. He did request a CT scan of the chest.  Celene Kras, MD 07/29/11 1728  Celene Kras, MD 07/29/11 [phone]

## 2011-07-29 NOTE — ED Notes (Signed)
Pt sats in low 90's and placed on 2 L O2

## 2011-07-29 NOTE — ED Notes (Signed)
Pt to xray

## 2011-07-29 NOTE — Consult Note (Addendum)
Chief Complaint  Patient presents with  . Hemoptysis    HISTORY of PRESENT ILLNESS:  Valerie Mosley is a 60 y.o. female admitted on 07/29/2011 with Hemoptysis.   Hemoptysis started at 2 AM on the day of presentation, started with clots then progressed to frank blood.  Patient attempted to clear her secretion and manage at home til 11 AM where she could not tolerate additional coughing and had a near syncopal episode and decided to come to the ED.  In the ED, symptoms improved after a 6 hours observation period and patient was going to be discharged but had another episode of approximately 75-100 ml of frank hemoptysis.  At that point, PCCM was called on consultation.  CT of the chest was ordered that showed a LUL nodule and decision was made to admit patient.  No cardiac history, no sick contact, no wt loss, no recent travel and no personal history of or contact with TB.  No fever or recent episodes of infection/bronchitis.  No activity limitation.  Past Medical History  Diagnosis Date  . Hypertension   . COPD (chronic obstructive pulmonary disease)   . Kyphoscoliosis    Past Surgical History  Procedure Date  . Urethra surgery   . Thyroid surgery    History reviewed. Father died of met melanoma at age 66 and mother from an intracranial aneurysm.  SH: Smokes 1/2 - 2 packs of cigarettes per day x40 years.  Drinks 2 beers per night after work.  No Known Allergies  Medications Prior to Admission  Medication Dose Route Frequency Provider Last Rate Last Dose  . 0.9 %  sodium chloride infusion   Intravenous Continuous Celene Kras, MD       No current outpatient prescriptions on file as of 07/29/2011.    ROS: Constitutional:   No  weight loss, night sweats,  Fevers, chills, fatigue, lassitude. HEENT:   No headaches,  Difficulty swallowing,  Tooth/dental problems,  Sore throat, no sneezing, itching, ear ache, nasal congestion, post nasal drip,   CV:  No chest pain,  Orthopnea, PND,  swelling in lower extremities, anasarca, dizziness, palpitations  GI  No heartburn, indigestion, abdominal pain, nausea, vomiting, diarrhea, change in bowel habits, loss of appetite  Resp: Pt indicates cough with frank blood as above.   Skin: no rash or lesions.  GU: no dysuria, change in color of urine, no urgency or frequency.  No flank pain.  MS:  No joint pain or swelling.  No decreased range of motion.  No back pain.  Psych:  No change in mood or affect. No depression or anxiety.  No memory loss.   PHYICAL EXAM:  Blood pressure 129/89, pulse 94, temperature 98.3 F (36.8 C), temperature source Oral, resp. rate 18, weight 131 lb (59.421 kg), SpO2 97.00%.  General:  Well appearing female in no acute distress unless is coughing. Neuro: A&O x3, moving all extremities. CV: RRR, Nl S1/S2, -M/R/G. PULM: Diffuse end exp wheezes. GI: Soft, NT, ND and +BS. Extremities: -edema and -tenderness.  Lab Results  Component Value Date   WBC 11.9* 07/29/2011   HGB 16.5* 07/29/2011   HCT 47.3* 07/29/2011   MCV 90.6 07/29/2011   PLT 329 07/29/2011  ,  Lab Results  Component Value Date   CREATININE 0.61 07/29/2011   BUN 11 07/29/2011   NA 141 07/29/2011   K 3.5 07/29/2011   CL 105 07/29/2011   CO2 23 07/29/2011  ,  Lab Results  Component Value Date  ALT 16 07/29/2011   AST 20 07/29/2011   ALKPHOS 156* 07/29/2011   BILITOT 0.3 07/29/2011    Dg Chest 2 View  07/29/2011  *RADIOLOGY REPORT*  Clinical Data: Coughing up blood since this morning, slight fever  CHEST - 2 VIEW  Comparison: 01/22/2008; 06/22/2007  Findings:  Unchanged cardiac silhouette and mediastinal contours.  The lungs remain hyperinflated with persistent prominence of the pulmonary interstitium.  No new focal airspace opacities.  There is persistent blunting of the bilateral costophrenic angles without definite pleural effusion.  Unchanged bones including accentuated kyphotic curvature of the upper/mid thoracic  spine.  IMPRESSION:  Grossly stable emphysematous change without definite superimposed acute cardiopulmonary process.  Original Report Authenticated By: Waynard Reeds, M.D.   Chest CT with 1.1 cm LUL nodules, severe emphysema.  ASSESSMENT/PLAN:  Hemoptysis-likely related to nodule and primary concern is lung cancer given smoking history, very unlikely to be TB. PLAN: - follow h/h, stable at present - NPO after breakfast for likely bronch 11/13 in the afternoon. - Check coags. - 2D echo for ?mitral valve disease. - Defer on PPD for now as the patient has negligible pre-test probability.  COPD (chronic obstructive pulmonary disease) PLAN: -BD's as PRN -O2 to keep sats > 92% -IS per RT protocol and flutter valve per RT protocol. -Pan culture -Rocephin and zithromax -Sputum cytology. -No need for steroids.  Tobacco abuse PLAN: -smoking cessation counseling   Kyphoscoliosis-noted on exam & xray PLAN: -no acute interventions needed at this time.   S/p thyroidectomy PLAN: -Check TSH and free T4.  FOB positive.  H/H stable.  Not gross, likely from swallowing coughed up blood.  This is clear hemoptysis. PLAN: -Monitor Heidi Dach, M.D.  This is an H&P not a consult note, please edit.  Patient seen and examined, agree with above note.  I dictated the care and orders written for this patient under my direction.  Koren Bound, M.D.

## 2011-07-29 NOTE — ED Notes (Signed)
Patient is resting comfortably. 

## 2011-07-29 NOTE — ED Notes (Signed)
Critical Care Dr at bedside.

## 2011-07-29 NOTE — ED Notes (Signed)
Pt states she woke up with dark, loose stools and coughing up blood this morning around 2am.

## 2011-07-30 ENCOUNTER — Inpatient Hospital Stay (HOSPITAL_COMMUNITY): Payer: BC Managed Care – PPO

## 2011-07-30 ENCOUNTER — Encounter (HOSPITAL_COMMUNITY)
Admission: EM | Disposition: A | Discharge status: Home / Self Care | Payer: Self-pay | Source: Home / Self Care | Attending: Pulmonary Disease

## 2011-07-30 ENCOUNTER — Other Ambulatory Visit: Payer: Self-pay | Admitting: Pulmonary Disease

## 2011-07-30 DIAGNOSIS — R042 Hemoptysis: Secondary | ICD-10-CM

## 2011-07-30 DIAGNOSIS — R918 Other nonspecific abnormal finding of lung field: Secondary | ICD-10-CM

## 2011-07-30 DIAGNOSIS — J449 Chronic obstructive pulmonary disease, unspecified: Secondary | ICD-10-CM

## 2011-07-30 LAB — URINE CULTURE
Culture  Setup Time: 201211130159
Special Requests: NORMAL

## 2011-07-30 LAB — BASIC METABOLIC PANEL
BUN: 12 mg/dL (ref 6–23)
CO2: 21 mEq/L (ref 19–32)
Calcium: 8.1 mg/dL — ABNORMAL LOW (ref 8.4–10.5)
Chloride: 109 mEq/L (ref 96–112)
Creatinine, Ser: 0.6 mg/dL (ref 0.50–1.10)
GFR calc Af Amer: >90 mL/min (ref >90)
GFR calc non Af Amer: >90 mL/min (ref >90)
Glucose, Bld: 94 mg/dL (ref 70–99)
Potassium: 3.3 mEq/L — ABNORMAL LOW (ref 3.5–5.1)
Sodium: 140 mEq/L (ref 135–145)

## 2011-07-30 LAB — MRSA PCR SCREENING: MRSA by PCR: NEGATIVE

## 2011-07-30 LAB — CBC
HCT: 38.9 % (ref 36.0–46.0)
Hemoglobin: 13.3 g/dL (ref 12.0–15.0)
MCH: 31.2 pg (ref 26.0–34.0)
MCHC: 34.2 g/dL (ref 30.0–36.0)
MCV: 91.3 fL (ref 78.0–100.0)
Platelets: 264 10*3/uL (ref 150–400)
RBC: 4.26 MIL/uL (ref 3.87–5.11)
RDW: 13.3 % (ref 11.5–15.5)
WBC: 9.2 10*3/uL (ref 4.0–10.5)

## 2011-07-30 LAB — MAGNESIUM: Magnesium: 2.1 mg/dL (ref 1.5–2.5)

## 2011-07-30 LAB — TSH: TSH: 2.88 u[IU]/mL (ref 0.350–4.500)

## 2011-07-30 LAB — T4, FREE: Free T4: 1.13 ng/dL (ref 0.80–1.80)

## 2011-07-30 LAB — PHOSPHORUS: Phosphorus: 3 mg/dL (ref 2.3–4.6)

## 2011-07-30 SURGERY — BRONCHOSCOPY, WITH FLUOROSCOPY
Anesthesia: Moderate Sedation | Laterality: Bilateral | Wound class: Clean Contaminated

## 2011-07-30 SURGERY — BRONCHOSCOPY, WITH FLUOROSCOPY
Anesthesia: Moderate Sedation | Laterality: Bilateral

## 2011-07-30 MED ORDER — LIDOCAINE HCL 2 % EX GEL: Freq: Once | CUTANEOUS | Status: DC

## 2011-07-30 MED ORDER — BUTAMBEN-TETRACAINE-BENZOCAINE 2-2-14 % EX AERO
1.0000 | INHALATION_SPRAY | Freq: Once | CUTANEOUS | Status: DC
  Filled 2011-07-30: qty 56

## 2011-07-30 MED ORDER — SODIUM CHLORIDE 0.9 % IV SOLN
Freq: Once | INTRAVENOUS | Status: AC
  Administered 2011-07-31: 10 mL/h via INTRAVENOUS

## 2011-07-30 MED ORDER — PHENYLEPHRINE HCL 0.25 % NA SOLN
1.0000 | Freq: Four times a day (QID) | NASAL | Status: DC | PRN
  Filled 2011-07-30: qty 15

## 2011-07-30 MED ORDER — POTASSIUM CHLORIDE 10 MEQ/100ML IV SOLN
10.0000 meq | Freq: Once | INTRAVENOUS | Status: AC
  Administered 2011-07-30: 10 meq via INTRAVENOUS

## 2011-07-30 MED ORDER — FENTANYL CITRATE 0.05 MG/ML IJ SOLN: 25.0000 ug | INTRAMUSCULAR | Status: DC | PRN

## 2011-07-30 MED ORDER — PANTOPRAZOLE SODIUM 40 MG PO TBEC
40.0000 mg | DELAYED_RELEASE_TABLET | Freq: Every day | ORAL | Status: DC
  Administered 2011-07-30 – 2011-08-05 (×7): 40 mg via ORAL
  Filled 2011-07-30 (×6): qty 1

## 2011-07-30 MED ORDER — DEXTROSE 5 % IV SOLN
500.0000 mg | INTRAVENOUS | Status: DC
  Administered 2011-07-30 – 2011-08-03 (×5): 500 mg via INTRAVENOUS
  Filled 2011-07-30 (×6): qty 500

## 2011-07-30 MED ORDER — POTASSIUM CHLORIDE 10 MEQ/100ML IV SOLN
INTRAVENOUS | Status: AC
  Administered 2011-07-30: 10 meq via INTRAVENOUS
  Filled 2011-07-30: qty 100

## 2011-07-30 MED ORDER — MIDAZOLAM HCL 5 MG/ML IJ SOLN: 1.0000 mg | INTRAMUSCULAR | Status: DC | PRN

## 2011-07-30 MED ORDER — DEXTROSE 5 % IV SOLN
1.0000 g | INTRAVENOUS | Status: DC
  Administered 2011-07-30 – 2011-08-03 (×5): 1 g via INTRAVENOUS
  Filled 2011-07-30 (×6): qty 10

## 2011-07-30 MED ORDER — MIDAZOLAM HCL 5 MG/ML IJ SOLN
INTRAMUSCULAR | Status: AC
  Administered 2011-07-30: 2 mg via INTRAVENOUS
  Filled 2011-07-30: qty 1

## 2011-07-30 MED ORDER — MIDAZOLAM HCL 5 MG/ML IJ SOLN
INTRAMUSCULAR | Status: AC
  Administered 2011-07-30: 5 mg via INTRAVENOUS
  Filled 2011-07-30: qty 1

## 2011-07-30 MED ORDER — ACETAMINOPHEN 325 MG PO TABS: 650.0000 mg | ORAL_TABLET | Freq: Four times a day (QID) | ORAL | Status: DC | PRN

## 2011-07-30 MED ORDER — FENTANYL CITRATE 0.05 MG/ML IJ SOLN
INTRAMUSCULAR | Status: AC
  Administered 2011-07-30: 14:00:00 via INTRAVENOUS
  Filled 2011-07-30: qty 2

## 2011-07-30 MED ORDER — FENTANYL CITRATE 0.05 MG/ML IJ SOLN
INTRAMUSCULAR | Status: AC
  Administered 2011-07-30: 25 ug via INTRAVENOUS
  Filled 2011-07-30: qty 2

## 2011-07-30 NOTE — Progress Notes (Signed)
.  Bronchoscopy Procedure Note  Date of Operation: 07/30/2011  Pre-op Diagnosis: hemoptysis  Post-op Diagnosis: same  Surgeon: Leslye Peer.  Assistants: none  Anesthesia: Conscious sedation: versed 7mg  and fentanyl in divided doses   Operation: Flexible fiberoptic bronchoscopy, diagnostic with endobronchial bx's and BAL  Findings: apparent clot (vs endobronchial lesion) eminating from RML and RLL airways  Specimen: 1. EBBx from RML clot/lesion 2. EBBx from RLL clot/lesion 3. BAL from apical segment RUL  Estimated Blood Loss: none  Drains: none  Complications: none  Indications and History: The patient is a 60 y.o. female with significant tobacco hx. Admitted 11/12 with hemoptysis. CT scan chest with RUL infiltrate and also a small L apical nodule (1cm).  The risks, benefits, complications, treatment options and expected outcomes were discussed with the patient.  The possibilities of reaction to medication, pulmonary aspiration, perforation of a viscus, bleeding, failure to diagnose a condition and creating a complication requiring transfusion or operation were discussed with the patient who freely signed the consent.    Description of Procedure: The patient was seen in the ICU and the site of surgery properly noted/marked.  The patient identified as Valerie Mosley and the procedure verified as Flexible Fiberoptic Bronchoscopy.  A Time Out was held and the above information confirmed.   After the induction of topical nasopharyngeal anesthesia, the patient was positioned in supine p[osition and the bronchoscope was passed through the L nare. The vocal cords were visualized and  1% plain lidocaine 10 ml was topically placed onto the cords. The cords were normal. The scope was then passed into the trachea.  1% plain lidocaine approx 15 ml was used topically on the carina and B mainstem bronchi.  Careful inspection of the tracheal lumen was accomplished. The scope was  sequentially passed into the left main and then left upper and lower bronchi and segmental bronchi. The L sided exam was normal. There was no active bleeding seen, although there may have been some old blood eminating from the RUL posterior segment. There were large, old clots in both the RML and RLL airways. These could not be fully suctioned. Endobronchial biopsies were performed on the fibrinous portions of these clots to insure that these were not actually endobronchial lesions. These will be sent for pathology. Finally a BAL was performed in the apical segment of the RUL, 60cc NS instilled and approx 16cc returned. To be sent for cytology and micro.    Trachea: Normal mucosa Carina: Normal mucosa Right main bronchus: Normal mucosa Right upper lobe bronchus: some possible old blood noted, no clots Right ML lobe bronchus: clot noted, EBBx taken Right LL lobe bronchus: clot noted, EBBx taken Left main bronchus: Normal mucosa Left upper lobe bronchus: Normal mucosa Left lower lobe bronchus: Normal mucosa  The Patient was taken to the Endoscopy Recovery area in satisfactory condition.  Attestation: I performed the procedure.  Majel Giel S.

## 2011-07-30 NOTE — Progress Notes (Signed)
Bronchoscopy procedure started.

## 2011-07-30 NOTE — Consult Note (Signed)
Chief Complaint  Patient presents with  . Hemoptysis    HISTORY of PRESENT ILLNESS:  Valerie Mosley is a 60 y.o. female admitted on 07/29/2011 with Hemoptysis which began at 2AM, started with clots then progressed to frank blood.  Patient attempted to clear her secretion and manage at home til 11 AM where she could not tolerate additional coughing and had a near syncopal episode and decided to come to the ED.  In the ED, symptoms improved after a 6 hours observation period and patient was going to be discharged but had another episode of approximately 75-100 ml of frank hemoptysis.  At that point, PCCM was called on consultation.  CT of the chest was ordered that showed a LUL nodule and decision was made to admit patient.  No cardiac history, no sick contact, no wt loss, no recent travel and no personal history of or contact with TB.  No fever or recent episodes of infection/bronchitis.  No activity limitation. Results for orders placed during the hospital encounter of 07/29/11  CULTURE, BLOOD (ROUTINE X 2)     Status: Normal (Preliminary result)   Collection Time   07/29/11  7:20 PM      Component Value Range Status Comment   Specimen Description BLOOD LEFT ARM   Final    Special Requests BOTTLES DRAWN AEROBIC AND ANAEROBIC 3CC   Final    Setup Time [id number]   Final    Culture     Final    Value:        BLOOD CULTURE RECEIVED NO GROWTH TO DATE CULTURE WILL BE HELD FOR 5 DAYS BEFORE ISSUING A FINAL NEGATIVE REPORT   Report Status PENDING   Incomplete   CULTURE, BLOOD (ROUTINE X 2)     Status: Normal (Preliminary result)   Collection Time   07/29/11  7:25 PM      Component Value Range Status Comment   Specimen Description BLOOD LEFT HAND   Final    Special Requests BOTTLES DRAWN AEROBIC AND ANAEROBIC 3CC   Final    Setup Time [id number]   Final    Culture     Final    Value:        BLOOD CULTURE RECEIVED NO GROWTH TO DATE CULTURE WILL BE HELD FOR 5 DAYS BEFORE ISSUING A FINAL  NEGATIVE REPORT   Report Status PENDING   Incomplete   MRSA PCR SCREENING     Status: Normal   Collection Time   07/30/11  2:39 AM      Component Value Range Status Comment   MRSA by PCR NEGATIVE  NEGATIVE  Final   antibiotics 11/12 roc>> 11/12 zithro>>  S: CC: Hemoptysis. Denies pain, SOB, CP, fever, chills.    PHYICAL EXAM:  Blood pressure 113/78, pulse 96, temperature 98.6 F (37 C), temperature source Oral, resp. rate 19, height 5\' 5"  (1.651 m), weight 125 lb 14.1 oz (57.1 kg), SpO2 94.00%.  General:  Well appearing female in no acute distress unless is coughing. Neuro: A&O x3, moving all extremities. CV: RRR, Nl S1/S2, -M/R/G. PULM: Decreased lung sounds RUL, otherwise mild exp wheezing noted. Bloody sputum GI: Soft, NT, ND and +BS. Extremities: -edema and -tenderness.  Lab Results  Component Value Date   WBC 9.2 07/30/2011   HGB 13.3 07/30/2011   HCT 38.9 07/30/2011   MCV 91.3 07/30/2011   PLT 264 07/30/2011  ,  Lab Results  Component Value Date   CREATININE 0.60 07/30/2011   BUN 12  07/30/2011   NA 140 07/30/2011   K 3.3* 07/30/2011   CL 109 07/30/2011   CO2 21 07/30/2011  ,  Lab Results  Component Value Date   ALT 16 07/29/2011   AST 20 07/29/2011   ALKPHOS 156* 07/29/2011   BILITOT 0.3 07/29/2011    Dg Chest 2 View  07/29/2011  *RADIOLOGY REPORT*  Clinical Data: Coughing up blood since this morning, slight fever  CHEST - 2 VIEW  Comparison: 01/22/2008; 06/22/2007  Findings:  Unchanged cardiac silhouette and mediastinal contours.  The lungs remain hyperinflated with persistent prominence of the pulmonary interstitium.  No new focal airspace opacities.  There is persistent blunting of the bilateral costophrenic angles without definite pleural effusion.  Unchanged bones including accentuated kyphotic curvature of the upper/mid thoracic spine.  IMPRESSION:  Grossly stable emphysematous change without definite superimposed acute cardiopulmonary process.   Original Report Authenticated By: Waynard Reeds, M.D.   Ct Chest Wo Contrast  07/29/2011  *RADIOLOGY REPORT*  Clinical Data: Hemoptysis.  Smoker.  Previous thyroid surgery.  CT CHEST WITHOUT CONTRAST  Technique:  Multidetector CT imaging of the chest was performed following the standard protocol without IV contrast.  Comparison: Chest radiographs obtained earlier today.  Findings: The left lobe of the thyroid gland appears to be surgically absent.  There is a 1.0 cm poorly defined oval area of low density in an enlarged thyroid isthmus.  The right lobe also appears mildly enlarged and mildly heterogeneous.  Diffuse peribronchial thickening and bullous changes are demonstrated in both lungs.  There is also patchy interstitial prominence in the anterior right upper lobe.  There is minimal similar change in the medial right lower lobe.  There is also a 1.1 x 0.6 cm irregular density in the left upper lobe on image number 11.  This has some central lucency, on the axial images.  However, on the sagittal and coronal reconstructed images, this appears more homogeneously soft tissue density centrally and more mass-like.  There is also a 6 mm oval nodular density in the left lower lobe on image number 48.  No enlarged lymph nodes are seen.  The adrenal glands are unremarkable.  The included portion of the left kidney is somewhat small.  The right kidney is not included.  Multiple liver cysts are noted.  Accentuation of the dorsal kyphosis is noted as well as thoracic and cervical spine degenerative changes.  IMPRESSION:  1.  1.1 cm left upper lobe mass.  This is concerning for the possibility of a small lung neoplasm. 2.  Patchy density in the right upper lobe and minimal patchy density in the right lower lobe, suspicious for active infection/inflammation. 3.  Centrilobular emphysema and chronic bronchitic changes. 4.  Status post left thyroidectomy with multinodular goiter involving the remainder of the thyroid  gland, including a 1.0 cm probable nodule in the thyroid isthmus.  This could be better delineated with elective thyroid ultrasound. 5.  6 mm nonspecific left lower lobe nodule.  Original Report Authenticated By: Darrol Angel, M.D.   Chest CT with 1.1 cm LUL nodules, severe emphysema.  ASSESSMENT/PLAN:  Hemoptysis-likely related to nodule and primary concern is lung cancer given smoking history, very unlikely to be TB. Hemoptysis persists.H&H/Coags stable. . 2D echo pending.  PLAN: - Defer on PPD for now as the patient has negligible pre-test probability. - Monitor  -Bronch 11/13 @ 2PM (Sydney Azure)  COPD (chronic obstructive pulmonary disease) PLAN:   -BD's as PRN -O2 to keep sats >  92% -IS per RT protocol and flutter valve per RT protocol. -Pan culture -Rocephin and zithromax -Sputum cytology. -No need for steroids.  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   S/p thyroidectomy. TSH, free T 4 stable.  PLAN - monitor  .  H/H stable.  Not gross, likely from swallowing coughed up blood.  This is clear hemoptysis. PLAN: -Monitor H/H.   Candie Chroman, ACNP.    Attending Addendum:  I have seen the patient, discussed the issues, test results and plans with S. Minor. I agree with the Assessment and Plans as outlined above.  Freya Zobrist S. 07/30/2011 3:11 PM

## 2011-07-30 NOTE — Procedures (Signed)
Bronchoscopy Procedure Note Valerie Mosley [id number] 1951/08/05  Procedure: Bronchoscopy Indications: Obtain specimens for culture and/or other diagnostic studies  Procedure Details Consent: Risks of procedure as well as the alternatives and risks of each were explained to the (patient/caregiver).  Consent for procedure obtained. Time Out: Verified patient identification, verified procedure, site/side was marked, verified correct patient position, special equipment/implants available, medications/allergies/relevent history reviewed, required imaging and test results available. Timeout:   Performed 14:35  In preparation for procedure, bronchoscope lubricated and 1% lidocaine in HHN given, 2 sprays of .25 neosynephine given up each nostril, 2% lidocaine jelly put up both nostile. Sedation: Versed 2.5mg , Fenyal 14:28, Versed2.5 mg 14:33, Fentanyl 25 mcg 14:35, Fentanyl 25 mcg 14:40, Versed 2mg , Fentanyl 14:54  Airway entered and the following bronchi were examined: RUL, RML and RLL.   Procedures performed: Brushings performed Bronchoscope removed.    Evaluation Hemodynamic Status: BP stable throughout; O2 sats: stable throughout Patient's Current Condition: stable Specimens:  RML biopsy, RLL biopsy, RUL bal washing Complications: No apparent complications Patient did tolerate procedure well.   Sharif Rendell, Antionette Char 07/30/2011

## 2011-07-30 NOTE — Progress Notes (Signed)
2D Echo Completed.  Valerie Mosley 07/30/11. 5:14pm

## 2011-07-30 NOTE — Progress Notes (Signed)
Ur review completed  

## 2011-07-31 ENCOUNTER — Inpatient Hospital Stay (HOSPITAL_COMMUNITY): Payer: BC Managed Care – PPO

## 2011-07-31 ENCOUNTER — Encounter (HOSPITAL_COMMUNITY): Payer: Self-pay

## 2011-07-31 LAB — BASIC METABOLIC PANEL
BUN: 11 mg/dL (ref 6–23)
CO2: 25 mEq/L (ref 19–32)
Calcium: 8.3 mg/dL — ABNORMAL LOW (ref 8.4–10.5)
Chloride: 106 mEq/L (ref 96–112)
Creatinine, Ser: 0.62 mg/dL (ref 0.50–1.10)
GFR calc Af Amer: >90 mL/min (ref >90)
GFR calc non Af Amer: >90 mL/min (ref >90)
Glucose, Bld: 116 mg/dL — ABNORMAL HIGH (ref 70–99)
Potassium: 3.4 mEq/L — ABNORMAL LOW (ref 3.5–5.1)
Sodium: 136 mEq/L (ref 135–145)

## 2011-07-31 LAB — CBC
HCT: 36.1 % (ref 36.0–46.0)
Hemoglobin: 11.9 g/dL — ABNORMAL LOW (ref 12.0–15.0)
MCH: 30.7 pg (ref 26.0–34.0)
MCHC: 33 g/dL (ref 30.0–36.0)
MCV: 93 fL (ref 78.0–100.0)
Platelets: 239 10*3/uL (ref 150–400)
RBC: 3.88 MIL/uL (ref 3.87–5.11)
RDW: 13.2 % (ref 11.5–15.5)
WBC: 8.2 10*3/uL (ref 4.0–10.5)

## 2011-07-31 MED ORDER — POTASSIUM CHLORIDE CRYS ER 20 MEQ PO TBCR
EXTENDED_RELEASE_TABLET | ORAL | Status: AC
  Administered 2011-07-31: 40 meq via ORAL
  Filled 2011-07-31: qty 2

## 2011-07-31 MED ORDER — POTASSIUM CHLORIDE CRYS ER 20 MEQ PO TBCR: 40.0000 meq | EXTENDED_RELEASE_TABLET | Freq: Once | ORAL | Status: AC

## 2011-07-31 NOTE — Progress Notes (Addendum)
Chief Complaint  Patient presents with  . Hemoptysis    HISTORY of PRESENT ILLNESS:  Valerie Mosley is a 60 y.o. female admitted on 07/29/2011 with Hemoptysis which began at 2AM, started with clots then progressed to frank blood.  Patient attempted to clear her secretion and manage at home til 11 AM where she could not tolerate additional coughing and had a near syncopal episode and decided to come to the ED.  In the ED, symptoms improved after a 6 hours observation period and patient was going to be discharged but had another episode of approximately 75-100 ml of frank hemoptysis.  At that point, PCCM was called on consultation.  CT of the chest was ordered that showed a LUL nodule and decision was made to admit patient.  No cardiac history, no sick contact, no wt loss, no recent travel and no personal history of or contact with TB.  No fever or recent episodes of infection/bronchitis.  No activity limitation.  Results for orders placed during the hospital encounter of 07/29/11  CULTURE, BLOOD (ROUTINE X 2)     Status: Normal (Preliminary result)   Collection Time   07/29/11  7:20 PM      Component Value Range Status Comment   Specimen Description BLOOD LEFT ARM   Final    Special Requests BOTTLES DRAWN AEROBIC AND ANAEROBIC 3CC   Final    Setup Time [id number]   Final    Culture     Final    Value:        BLOOD CULTURE RECEIVED NO GROWTH TO DATE CULTURE WILL BE HELD FOR 5 DAYS BEFORE ISSUING A FINAL NEGATIVE REPORT   Report Status PENDING   Incomplete   CULTURE, BLOOD (ROUTINE X 2)     Status: Normal (Preliminary result)   Collection Time   07/29/11  7:25 PM      Component Value Range Status Comment   Specimen Description BLOOD LEFT HAND   Final    Special Requests BOTTLES DRAWN AEROBIC AND ANAEROBIC 3CC   Final    Setup Time [id number]   Final    Culture     Final    Value:        BLOOD CULTURE RECEIVED NO GROWTH TO DATE CULTURE WILL BE HELD FOR 5 DAYS BEFORE ISSUING A FINAL  NEGATIVE REPORT   Report Status PENDING   Incomplete   URINE CULTURE     Status: Normal   Collection Time   07/29/11  8:21 PM      Component Value Range Status Comment   Specimen Description URINE, CLEAN CATCH   Final    Special Requests Normal   Final    Setup Time [id number]   Final    Colony Count 20,OOO COLONIES/ML   Final    Culture     Final    Value: Multiple bacterial morphotypes present, none predominant. Suggest appropriate recollection if clinically indicated.   Report Status 07/30/2011 FINAL   Final   MRSA PCR SCREENING     Status: Normal   Collection Time   07/30/11  2:39 AM      Component Value Range Status Comment   MRSA by PCR NEGATIVE  NEGATIVE  Final   CULTURE, RESPIRATORY     Status: Normal (Preliminary result)   Collection Time   07/30/11  8:15 AM      Component Value Range Status Comment   Specimen Description SPUTUM   Final    Special Requests Normal  Final    Gram Stain     Final    Value: RARE WBC PRESENT,BOTH PMN AND MONONUCLEAR     FEW SQUAMOUS EPITHELIAL CELLS PRESENT     RARE GRAM POSITIVE COCCI     IN PAIRS RARE GRAM POSITIVE RODS     RARE GRAM NEGATIVE RODS   Culture NORMAL OROPHARYNGEAL FLORA   Final    Report Status PENDING   Incomplete   CULTURE, BAL-QUANTITATIVE     Status: Normal (Preliminary result)   Collection Time   07/30/11  3:31 PM      Component Value Range Status Comment   Specimen Description BRONCHIAL ALVEOLAR LAVAGE   Final    Special Requests Normal   Final    Gram Stain     Final    Value: NO WBC SEEN     NO ORGANISMS SEEN   Colony Count PENDING   Incomplete    Culture NO GROWTH   Final    Report Status PENDING   Incomplete   antibiotics 11/12 roc>> 11/12 zithro>>  S: CC: Hemoptysis. Denies pain, SOB, CP, fever, chills.    PHYICAL EXAM:  Blood pressure 116/71, pulse 95, temperature 98.2 F (36.8 C), temperature source Oral, resp. rate 26, height 5\' 5"  (1.651 m), weight 129 lb 6.6 oz (58.7 kg), SpO2  94.00%.  General:  Well appearing female in no acute distress unless is coughing. Decrease in hemoptysis. Neuro: A&O x3, moving all extremities. CV: RRR, Nl S1/S2, -M/R/G. PULM: Decreased lung sounds RUL, otherwise mild exp wheezing noted. Bloody sputum decreased GI: Soft, NT, ND and +BS. Extremities: -edema and -tenderness.  Lab Results  Component Value Date   WBC 8.2 07/31/2011   HGB 11.9* 07/31/2011   HCT 36.1 07/31/2011   MCV 93.0 07/31/2011   PLT 239 07/31/2011  ,  Lab Results  Component Value Date   CREATININE 0.62 07/31/2011   BUN 11 07/31/2011   NA 136 07/31/2011   K 3.4* 07/31/2011   CL 106 07/31/2011   CO2 25 07/31/2011  ,  Lab Results  Component Value Date   ALT 16 07/29/2011   AST 20 07/29/2011   ALKPHOS 156* 07/29/2011   BILITOT 0.3 07/29/2011    Dg Chest 2 View  07/29/2011  *RADIOLOGY REPORT*  Clinical Data: Coughing up blood since this morning, slight fever  CHEST - 2 VIEW  Comparison: 01/22/2008; 06/22/2007  Findings:  Unchanged cardiac silhouette and mediastinal contours.  The lungs remain hyperinflated with persistent prominence of the pulmonary interstitium.  No new focal airspace opacities.  There is persistent blunting of the bilateral costophrenic angles without definite pleural effusion.  Unchanged bones including accentuated kyphotic curvature of the upper/mid thoracic spine.  IMPRESSION:  Grossly stable emphysematous change without definite superimposed acute cardiopulmonary process.  Original Report Authenticated By: Waynard Reeds, M.D.   Ct Chest Wo Contrast  07/29/2011  *RADIOLOGY REPORT*  Clinical Data: Hemoptysis.  Smoker.  Previous thyroid surgery.  CT CHEST WITHOUT CONTRAST  Technique:  Multidetector CT imaging of the chest was performed following the standard protocol without IV contrast.  Comparison: Chest radiographs obtained earlier today.  Findings: The left lobe of the thyroid gland appears to be surgically absent.  There is a 1.0 cm  poorly defined oval area of low density in an enlarged thyroid isthmus.  The right lobe also appears mildly enlarged and mildly heterogeneous.  Diffuse peribronchial thickening and bullous changes are demonstrated in both lungs.  There is also patchy interstitial  prominence in the anterior right upper lobe.  There is minimal similar change in the medial right lower lobe.  There is also a 1.1 x 0.6 cm irregular density in the left upper lobe on image number 11.  This has some central lucency, on the axial images.  However, on the sagittal and coronal reconstructed images, this appears more homogeneously soft tissue density centrally and more mass-like.  There is also a 6 mm oval nodular density in the left lower lobe on image number 48.  No enlarged lymph nodes are seen.  The adrenal glands are unremarkable.  The included portion of the left kidney is somewhat small.  The right kidney is not included.  Multiple liver cysts are noted.  Accentuation of the dorsal kyphosis is noted as well as thoracic and cervical spine degenerative changes.  IMPRESSION:  1.  1.1 cm left upper lobe mass.  This is concerning for the possibility of a small lung neoplasm. 2.  Patchy density in the right upper lobe and minimal patchy density in the right lower lobe, suspicious for active infection/inflammation. 3.  Centrilobular emphysema and chronic bronchitic changes. 4.  Status post left thyroidectomy with multinodular goiter involving the remainder of the thyroid gland, including a 1.0 cm probable nodule in the thyroid isthmus.  This could be better delineated with elective thyroid ultrasound. 5.  6 mm nonspecific left lower lobe nodule.  Original Report Authenticated By: Darrol Angel, M.D.   Dg Chest Port 1 View  07/31/2011  *RADIOLOGY REPORT*  Clinical Data: Hemoptysis  PORTABLE CHEST - 1 VIEW  Comparison: CT chest of 07/29/2011 and chest x-ray of the same date  Findings: The lungs are not as well aerated with basilar  atelectasis.  There may also be mild pulmonary vascular congestion present.  The heart is within upper limits of normal.  No bony abnormality is seen.  IMPRESSION: Diminished aeration with basilar atelectasis and probable mild pulmonary vascular congestion.  Original Report Authenticated By: Juline Patch, M.D.   Dg Bronchi Uni  07/30/2011  CLINICAL DATA: bronch procedure   BRONCHO UNI FLUORO  Fluoroscopy was utilized by the requesting physician.  No radiographic  interpretation.     Chest CT with 1.1 cm LUL nodules, severe emphysema.  ASSESSMENT/PLAN:  Hemoptysis-likely related to nodule and primary concern is lung cancer given smoking history, very unlikely to be TB. Hemoptysis persists.H&H/Coags stable. . 2D echo read pending.  PLAN: - Defer on PPD for now as the patient has negligible pre-test probability. - Monitor  -Bronched 11/13 @ 2PM (Valerie Mosley) see report COPD (chronic obstructive pulmonary disease) PLAN:   -BD's  PRN -O2 to keep sats > 92% -IS per RT protocol and flutter valve per RT protocol. -Pan culture -Rocephin and zithromax -Sputum cytology. -No need for steroids. -will consider repeat FOB next 1 -2 days  - LUL nodule will need separate   Hypokalemia PLAN -replenish  Tobacco abuse PLAN: -smoking cessation counseling   Kyphoscoliosis-noted on exam & xray PLAN: -no acute interventions needed at this time.   S/p thyroidectomy. TSH, free T 4 stable.  PLAN - monitor  .  H/H stable.  Not gross, likely from swallowing coughed up blood.  This is clear hemoptysis. PLAN: -Monitor H/H.   Valerie Mosley, ACNP.    Attending Addendum:  I have seen the patient, discussed the issues, test results and plans with Valerie Mosley. I agree with the Assessment and Plans as outlined above.  Valerie Cuthrell S. 07/31/2011 5:08 PM

## 2011-07-31 NOTE — Progress Notes (Signed)
eLink Physician-Brief Progress Note Patient Name: Valerie Mosley DOB: 05-14-1951 MRN: [id number]  Date of Service  07/31/2011   HPI/Events of Note  hypokalemia  eICU Interventions  Potassium replaced      Caspar Favila 07/31/2011, 5:05 AM

## 2011-08-01 ENCOUNTER — Inpatient Hospital Stay (HOSPITAL_COMMUNITY): Payer: BC Managed Care – PPO

## 2011-08-01 LAB — CULTURE, RESPIRATORY W GRAM STAIN
Culture: NORMAL
Special Requests: NORMAL

## 2011-08-01 LAB — BASIC METABOLIC PANEL
BUN: 7 mg/dL (ref 6–23)
CO2: 25 mEq/L (ref 19–32)
Calcium: 8.2 mg/dL — ABNORMAL LOW (ref 8.4–10.5)
Chloride: 106 mEq/L (ref 96–112)
Creatinine, Ser: 0.61 mg/dL (ref 0.50–1.10)
GFR calc Af Amer: >90 mL/min (ref >90)
GFR calc non Af Amer: >90 mL/min (ref >90)
Glucose, Bld: 115 mg/dL — ABNORMAL HIGH (ref 70–99)
Potassium: 3.5 mEq/L (ref 3.5–5.1)
Sodium: 138 mEq/L (ref 135–145)

## 2011-08-01 LAB — CBC
HCT: 36.2 % (ref 36.0–46.0)
Hemoglobin: 11.8 g/dL — ABNORMAL LOW (ref 12.0–15.0)
MCH: 30.4 pg (ref 26.0–34.0)
MCHC: 32.6 g/dL (ref 30.0–36.0)
MCV: 93.3 fL (ref 78.0–100.0)
Platelets: 242 10*3/uL (ref 150–400)
RBC: 3.88 MIL/uL (ref 3.87–5.11)
RDW: 13 % (ref 11.5–15.5)
WBC: 8.6 10*3/uL (ref 4.0–10.5)

## 2011-08-01 LAB — PROTIME-INR
INR: 1.09 (ref 0.00–1.49)
Prothrombin Time: 14.3 seconds (ref 11.6–15.2)

## 2011-08-01 LAB — APTT: aPTT: 35 seconds (ref 24–37)

## 2011-08-01 NOTE — Progress Notes (Signed)
HISTORY of PRESENT ILLNESS:  Valerie Mosley is a 60 y.o. female admitted on 07/29/2011 with Hemoptysis which began at 2AM, started with clots then progressed to frank blood.  Patient attempted to clear her secretion and manage at home til 11 AM where she could not tolerate additional coughing and had a near syncopal episode and decided to come to the ED.  In the ED, symptoms improved after a 6 hours observation period and patient was going to be discharged but had another episode of approximately 75-100 ml of frank hemoptysis.  At that point, PCCM was called on consultation.  CT of the chest was ordered that showed a LUL nodule and decision was made to admit patient.  No cardiac history, no sick contact, no wt loss, no recent travel and no personal history of or contact with TB.  No fever or recent episodes of infection/bronchitis.  No activity limitation.  Results for orders placed during the hospital encounter of 07/29/11  CULTURE, BLOOD (ROUTINE X 2)     Status: Normal (Preliminary result)   Collection Time   07/29/11  7:20 PM      Component Value Range Status Comment   Specimen Description BLOOD LEFT ARM   Final    Special Requests BOTTLES DRAWN AEROBIC AND ANAEROBIC 3CC   Final    Setup Time [id number]   Final    Culture     Final    Value:        BLOOD CULTURE RECEIVED NO GROWTH TO DATE CULTURE WILL BE HELD FOR 5 DAYS BEFORE ISSUING A FINAL NEGATIVE REPORT   Report Status PENDING   Incomplete   CULTURE, BLOOD (ROUTINE X 2)     Status: Normal (Preliminary result)   Collection Time   07/29/11  7:25 PM      Component Value Range Status Comment   Specimen Description BLOOD LEFT HAND   Final    Special Requests BOTTLES DRAWN AEROBIC AND ANAEROBIC 3CC   Final    Setup Time [id number]   Final    Culture     Final    Value:        BLOOD CULTURE RECEIVED NO GROWTH TO DATE CULTURE WILL BE HELD FOR 5 DAYS BEFORE ISSUING A FINAL NEGATIVE REPORT   Report Status PENDING   Incomplete     URINE CULTURE     Status: Normal   Collection Time   07/29/11  8:21 PM      Component Value Range Status Comment   Specimen Description URINE, CLEAN CATCH   Final    Special Requests Normal   Final    Setup Time [id number]   Final    Colony Count 20,OOO COLONIES/ML   Final    Culture     Final    Value: Multiple bacterial morphotypes present, none predominant. Suggest appropriate recollection if clinically indicated.   Report Status 07/30/2011 FINAL   Final   MRSA PCR SCREENING     Status: Normal   Collection Time   07/30/11  2:39 AM      Component Value Range Status Comment   MRSA by PCR NEGATIVE  NEGATIVE  Final   CULTURE, RESPIRATORY     Status: Normal   Collection Time   07/30/11  8:15 AM      Component Value Range Status Comment   Specimen Description SPUTUM   Final    Special Requests Normal   Final    Gram Stain  Final    Value: RARE WBC PRESENT,BOTH PMN AND MONONUCLEAR     FEW SQUAMOUS EPITHELIAL CELLS PRESENT     RARE GRAM POSITIVE COCCI     IN PAIRS RARE GRAM POSITIVE RODS     RARE GRAM NEGATIVE RODS   Culture NORMAL OROPHARYNGEAL FLORA   Final    Report Status 08/01/2011 FINAL   Final   AFB CULTURE WITH SMEAR     Status: Normal (Preliminary result)   Collection Time   07/30/11  3:31 PM      Component Value Range Status Comment   Specimen Description BRONCHIAL ALVEOLAR LAVAGE   Final    Special Requests Normal   Final    ACID FAST SMEAR NO ACID FAST BACILLI SEEN   Final    Culture     Final    Value: CULTURE WILL BE EXAMINED FOR 6 WEEKS BEFORE ISSUING A FINAL REPORT   Report Status PENDING   Incomplete   CULTURE, BAL-QUANTITATIVE     Status: Normal (Preliminary result)   Collection Time   07/30/11  3:31 PM      Component Value Range Status Comment   Specimen Description BRONCHIAL ALVEOLAR LAVAGE   Final    Special Requests Normal   Final    Gram Stain     Final    Value: NO WBC SEEN     NO ORGANISMS SEEN   Colony Count PENDING   Incomplete    Culture  Non-Pathogenic Oropharyngeal-type Flora Isolated.   Final    Report Status PENDING   Incomplete   FUNGUS CULTURE W SMEAR     Status: Normal (Preliminary result)   Collection Time   07/30/11  3:31 PM      Component Value Range Status Comment   Specimen Description BRONCHIAL ALVEOLAR LAVAGE   Final    Special Requests Normal   Final    Fungal Smear NO YEAST OR FUNGAL ELEMENTS SEEN   Final    Culture CULTURE IN PROGRESS FOR FOUR WEEKS   Final    Report Status PENDING   Incomplete   antibiotics 11/12 Roc>> 11/12 zithro>>  S: CC: Hemoptysis. Denies pain, SOB, CP, fever, chills.  Scant hempoysis.   PHYICAL EXAM:  Blood pressure 111/75, pulse 78, temperature 98.1 F (36.7 C), temperature source Oral, resp. rate 19, height 5\' 5"  (1.651 m), weight 128 lb 1.4 oz (58.1 kg), SpO2 95.00%.  General:  Well appearing female in no acute distress. Talkative. Decrease in hemoptysis. Neuro: A&O x3, moving all extremities. CV: RRR, Nl S1/S2, -M/R/G. PULM:  Lung sounds improved /c symmetrical excursion.  Bloody sputum decreased. GI: Soft, NT, ND and +BS. Extremities: -edema and -tenderness.  Lab Results  Component Value Date   WBC 8.6 08/01/2011   HGB 11.8* 08/01/2011   HCT 36.2 08/01/2011   MCV 93.3 08/01/2011   PLT 242 08/01/2011  ,  Lab Results  Component Value Date   CREATININE 0.61 08/01/2011   BUN 7 08/01/2011   NA 138 08/01/2011   K 3.5 08/01/2011   CL 106 08/01/2011   CO2 25 08/01/2011  ,  Lab Results  Component Value Date   ALT 16 07/29/2011   AST 20 07/29/2011   ALKPHOS 156* 07/29/2011   BILITOT 0.3 07/29/2011    Dg Chest Port 1 View  08/01/2011  *RADIOLOGY REPORT*  Clinical Data: Hemoptysis  PORTABLE CHEST - 1 VIEW  Comparison: Chest radiograph 07/31/2011  Findings: Normal mediastinum and cardiac silhouette.  There is slight increase  in the right pleural effusion. The right heart border is poorly visualized secondary to right middle lobe air space disease.  Chronic  bronchitic markings.  IMPRESSION: 1. Right middle lobe air space disease.  2. Increased right pleural effusion.  Original Report Authenticated By: Genevive Bi, M.D.   Dg Chest Port 1 View  07/31/2011  *RADIOLOGY REPORT*  Clinical Data: Hemoptysis  PORTABLE CHEST - 1 VIEW  Comparison: CT chest of 07/29/2011 and chest x-ray of the same date  Findings: The lungs are not as well aerated with basilar atelectasis.  There may also be mild pulmonary vascular congestion present.  The heart is within upper limits of normal.  No bony abnormality is seen.  IMPRESSION: Diminished aeration with basilar atelectasis and probable mild pulmonary vascular congestion.  Original Report Authenticated By: Juline Patch, M.D.   Dg Bronchi Uni  07/30/2011  CLINICAL DATA: bronch procedure   BRONCHO UNI FLUORO  Fluoroscopy was utilized by the requesting physician.  No radiographic  interpretation.     Chest CT with 1.1 cm LUL nodules, severe emphysema.  ASSESSMENT/PLAN:  1. Hemoptysis- source unclear on FOB from 11/13, all blood was on the R (small apical nodule is on the left). No endobronchial lesion noted but RML and RLL were occluded w clot - path on bx's pending.      PLAN:     - Defer on PPD for now as the patient has negligible pre-test probability.     - Bronched 11/13 @ 2PM (Malakie Balis) see report     - Plan to repeat FOB to get better look at RLL and RML on 11/15   2. COPD (chronic obstructive pulmonary disease). Maintaining Sats on RA.      PLAN:     -BD's  PRN     -O2 to keep sats > 92%     -cultures pending     -Rocephin and zithromax     -Sputum cytology.     -LUL nodule will need separate evaluation, possibly ENB  3. Hypokalemia. Resolved  4. Tobacco abuse      PLAN:      -smoking cessation counseling   5. Kyphoscoliosis-noted on exam & xray     PLAN:     -no acute interventions needed at this time.   6. S/p thyroidectomy. TSH, free T 4 stable.       PLAN      -Stable. No further  required  Will leave her on stepdown until we visualize her airways and insure no source of bleeding.   Candie Chroman, ACNP.    Attending Addendum:  I have seen the patient, discussed the issues, test results and plans with S. Minor. I agree with the Assessment and Plans as outlined above.  Khaliel Morey S. 08/01/2011 2:41 PM

## 2011-08-02 ENCOUNTER — Encounter (HOSPITAL_COMMUNITY)
Admission: EM | Disposition: A | Discharge status: Home / Self Care | Payer: Self-pay | Source: Home / Self Care | Attending: Pulmonary Disease

## 2011-08-02 ENCOUNTER — Inpatient Hospital Stay (HOSPITAL_COMMUNITY): Payer: BC Managed Care – PPO

## 2011-08-02 LAB — CULTURE, BAL-QUANTITATIVE W GRAM STAIN
Gram Stain: NONE SEEN
Special Requests: NORMAL

## 2011-08-02 SURGERY — BRONCHOSCOPY, WITH FLUOROSCOPY
Anesthesia: Moderate Sedation | Laterality: Bilateral | Wound class: Clean Contaminated

## 2011-08-02 SURGERY — BRONCHOSCOPY, WITH FLUOROSCOPY
Anesthesia: Moderate Sedation

## 2011-08-02 MED ORDER — SODIUM CHLORIDE 0.9 % IV SOLN
Freq: Once | INTRAVENOUS | Status: AC
  Administered 2011-08-02: 20 mL/h via INTRAVENOUS

## 2011-08-02 MED ORDER — BUTAMBEN-TETRACAINE-BENZOCAINE 2-2-14 % EX AERO: 1.0000 | INHALATION_SPRAY | Freq: Once | CUTANEOUS | Status: DC

## 2011-08-02 MED ORDER — LIDOCAINE HCL 1 % IJ SOLN
6.0000 mL | Freq: Once | INTRAMUSCULAR | Status: AC
  Administered 2011-08-02: 6 mL via RESPIRATORY_TRACT

## 2011-08-02 MED ORDER — MIDAZOLAM HCL 5 MG/ML IJ SOLN
1.0000 mg | INTRAMUSCULAR | Status: DC | PRN
  Administered 2011-08-02: 1 mg via INTRAVENOUS
  Administered 2011-08-02: 3 mg via INTRAVENOUS
  Administered 2011-08-02: 2 mg via INTRAVENOUS

## 2011-08-02 MED ORDER — LIDOCAINE HCL 2 % EX GEL
Freq: Once | CUTANEOUS | Status: AC
  Administered 2011-08-02: 13:00:00 via TOPICAL

## 2011-08-02 MED ORDER — MIDAZOLAM HCL 10 MG/2ML IJ SOLN
INTRAMUSCULAR | Status: AC
  Filled 2011-08-02: qty 4

## 2011-08-02 MED ORDER — ACETYLCYSTEINE 20 % IN SOLN
10.0000 mL | Freq: Once | RESPIRATORY_TRACT | Status: AC
  Administered 2011-08-02: 5 mL

## 2011-08-02 MED ORDER — FENTANYL CITRATE 0.05 MG/ML IJ SOLN
25.0000 ug | INTRAMUSCULAR | Status: DC | PRN
  Administered 2011-08-02: 50 ug via INTRAVENOUS
  Administered 2011-08-02 (×2): 25 ug via INTRAVENOUS

## 2011-08-02 MED ORDER — PHENYLEPHRINE HCL 0.25 % NA SOLN
1.0000 | Freq: Four times a day (QID) | NASAL | Status: DC | PRN
  Administered 2011-08-02: 1 via NASAL
  Filled 2011-08-02: qty 15

## 2011-08-02 MED ORDER — FENTANYL CITRATE 0.05 MG/ML IJ SOLN
INTRAMUSCULAR | Status: AC
  Filled 2011-08-02: qty 4

## 2011-08-02 NOTE — Brief Op Note (Signed)
Bronchoscopy Procedure Note BREEONA WAID [id number] 06-05-1951  Procedure: Bronchoscopy Indications: Diagnostic evaluation of the airways, Remove secretions and removal of blood clots from the RMl and RLL airways.   Procedure Details Consent: Risks of procedure as well as the alternatives and risks of each were explained to the (patient/caregiver).  Consent for procedure obtained. Time Out: Verified patient identification, verified procedure, site/side was marked, verified correct patient position, special equipment/implants available, medications/allergies/relevent history reviewed, required imaging and test results available.  Performed  In preparation for procedure, cetacaine spray, nebulized lidocaine, viscous lidocaine. Sedation: Versed 6mg , fentanyl in divided doses  Airway entered and the following bronchi were examined: RUL, RML, RLL, LUL and LLL.  The L sided airways were normal. Fluoro was briefly used to see if I could identify her LUL nodule, but none was apparent. This will have to be worked up later. The RUL had no clot or evidence of bleeding. There was blood-tinged mucous and clot in the RML and RLL. Mucomyst 5cc was diluted in NS and introduced, which enabled me to suction much of the mucous. The clots could not be dislodged. I was able to get a better inspection of the RML and RLL but it was an incomplete exam. There were no obvious endobronchial lesions seen. The scope was removed. The patient was stable throughout. There were no obvious complications.   Patient to be transferred back to room after recovered from sedation   Evaluation Hemodynamic Status: BP stable throughout; O2 sats: stable throughout Patient's Current Condition: stable Specimens:  None Complications: No apparent complications Patient did tolerate procedure well.   Brittanyann Wittner S. 08/02/2011

## 2011-08-02 NOTE — Progress Notes (Signed)
HISTORY of PRESENT ILLNESS:  Valerie Mosley is a 60 y.o. female admitted on 07/29/2011 with Hemoptysis which began at 2AM, started with clots then progressed to frank blood.  Patient attempted to clear her secretion and manage at home til 11 AM where she could not tolerate additional coughing and had a near syncopal episode and decided to come to the ED.  In the ED, symptoms improved after a 6 hours observation period and patient was going to be discharged but had another episode of approximately 75-100 ml of frank hemoptysis.  At that point, PCCM was called on consultation.  CT of the chest was ordered that showed a LUL nodule and decision was made to admit patient.  No cardiac history, no sick contact, no wt loss, no recent travel and no personal history of or contact with TB.  No fever or recent episodes of infection/bronchitis.  No activity limitation.  Results for orders placed during the hospital encounter of 07/29/11  CULTURE, BLOOD (ROUTINE X 2)     Status: Normal (Preliminary result)   Collection Time   07/29/11  7:20 PM      Component Value Range Status Comment   Specimen Description BLOOD LEFT ARM   Final    Special Requests BOTTLES DRAWN AEROBIC AND ANAEROBIC 3CC   Final    Setup Time [id number]   Final    Culture     Final    Value:        BLOOD CULTURE RECEIVED NO GROWTH TO DATE CULTURE WILL BE HELD FOR 5 DAYS BEFORE ISSUING A FINAL NEGATIVE REPORT   Report Status PENDING   Incomplete   CULTURE, BLOOD (ROUTINE X 2)     Status: Normal (Preliminary result)   Collection Time   07/29/11  7:25 PM      Component Value Range Status Comment   Specimen Description BLOOD LEFT HAND   Final    Special Requests BOTTLES DRAWN AEROBIC AND ANAEROBIC 3CC   Final    Setup Time [id number]   Final    Culture     Final    Value:        BLOOD CULTURE RECEIVED NO GROWTH TO DATE CULTURE WILL BE HELD FOR 5 DAYS BEFORE ISSUING A FINAL NEGATIVE REPORT   Report Status PENDING   Incomplete   URINE  CULTURE     Status: Normal   Collection Time   07/29/11  8:21 PM      Component Value Range Status Comment   Specimen Description URINE, CLEAN CATCH   Final    Special Requests Normal   Final    Setup Time [id number]   Final    Colony Count 20,OOO COLONIES/ML   Final    Culture     Final    Value: Multiple bacterial morphotypes present, none predominant. Suggest appropriate recollection if clinically indicated.   Report Status 07/30/2011 FINAL   Final   MRSA PCR SCREENING     Status: Normal   Collection Time   07/30/11  2:39 AM      Component Value Range Status Comment   MRSA by PCR NEGATIVE  NEGATIVE  Final   CULTURE, RESPIRATORY     Status: Normal   Collection Time   07/30/11  8:15 AM      Component Value Range Status Comment   Specimen Description SPUTUM   Final    Special Requests Normal   Final    Gram Stain     Final  Value: RARE WBC PRESENT,BOTH PMN AND MONONUCLEAR     FEW SQUAMOUS EPITHELIAL CELLS PRESENT     RARE GRAM POSITIVE COCCI     IN PAIRS RARE GRAM POSITIVE RODS     RARE GRAM NEGATIVE RODS   Culture NORMAL OROPHARYNGEAL FLORA   Final    Report Status 08/01/2011 FINAL   Final   AFB CULTURE WITH SMEAR     Status: Normal (Preliminary result)   Collection Time   07/30/11  3:31 PM      Component Value Range Status Comment   Specimen Description BRONCHIAL ALVEOLAR LAVAGE   Final    Special Requests Normal   Final    ACID FAST SMEAR NO ACID FAST BACILLI SEEN   Final    Culture     Final    Value: CULTURE WILL BE EXAMINED FOR 6 WEEKS BEFORE ISSUING A FINAL REPORT   Report Status PENDING   Incomplete   CULTURE, BAL-QUANTITATIVE     Status: Normal   Collection Time   07/30/11  3:31 PM      Component Value Range Status Comment   Specimen Description BRONCHIAL ALVEOLAR LAVAGE   Final    Special Requests Normal   Final    Gram Stain     Final    Value: NO WBC SEEN     NO ORGANISMS SEEN   Colony Count 20,OOO COLONIES/ML   Final    Culture Non-Pathogenic  Oropharyngeal-type Flora Isolated.   Final    Report Status 08/02/2011 FINAL   Final   FUNGUS CULTURE W SMEAR     Status: Normal (Preliminary result)   Collection Time   07/30/11  3:31 PM      Component Value Range Status Comment   Specimen Description BRONCHIAL ALVEOLAR LAVAGE   Final    Special Requests Normal   Final    Fungal Smear NO YEAST OR FUNGAL ELEMENTS SEEN   Final    Culture CULTURE IN PROGRESS FOR FOUR WEEKS   Final    Report Status PENDING   Incomplete   antibiotics 11/12 Roc>> 11/12 zithro>>  S:  Reports significant decrease in hemoptysis and old clots.  Path from FOB 11/13 shows fibrin clot, no malignancy Has had some burning in L arm when she gets her abx   PHYICAL EXAM:  Blood pressure 108/79, pulse 92, temperature 98.1 F (36.7 C), temperature source Oral, resp. rate 30, height 5\' 5"  (1.651 m), weight 57.6 kg (126 lb 15.8 oz), SpO2 93.00%.  General:  Well appearing female in no acute distress. Talkative. Decrease in hemoptysis. Neuro: A&O x3, moving all extremities. CV: RRR, Nl S1/S2, -M/R/G. PULM:  Lung sounds improved /c symmetrical excursion.  Bloody sputum decreased. GI: Soft, NT, ND and +BS. Extremities: -edema and -tenderness.  Lab Results  Component Value Date   WBC 8.6 08/01/2011   HGB 11.8* 08/01/2011   HCT 36.2 08/01/2011   MCV 93.3 08/01/2011   PLT 242 08/01/2011  ,  Lab Results  Component Value Date   CREATININE 0.61 08/01/2011   BUN 7 08/01/2011   NA 138 08/01/2011   K 3.5 08/01/2011   CL 106 08/01/2011   CO2 25 08/01/2011  ,  Lab Results  Component Value Date   ALT 16 07/29/2011   AST 20 07/29/2011   ALKPHOS 156* 07/29/2011   BILITOT 0.3 07/29/2011    Dg Chest Port 1 View  08/02/2011  *RADIOLOGY REPORT*  Clinical Data: Hemoptysis.  COPD.  PORTABLE CHEST - 1  VIEW  Comparison: 08/01/2011.  Findings: Right lower lobe airspace opacity remains present. Probable positional shift in the right pleural effusion. Underlying emphysema  is present.  Cardiopericardial silhouette is unchanged. Monitoring leads are projected over the chest.  IMPRESSION: No interval change and right middle lobe airspace disease and right pleural effusion.  Underlying emphysema.  Original Report Authenticated By: Andreas Newport, M.D.   Dg Chest Port 1 View  08/01/2011  *RADIOLOGY REPORT*  Clinical Data: Hemoptysis  PORTABLE CHEST - 1 VIEW  Comparison: Chest radiograph 07/31/2011  Findings: Normal mediastinum and cardiac silhouette.  There is slight increase in the right pleural effusion. The right heart border is poorly visualized secondary to right middle lobe air space disease.  Chronic bronchitic markings.  IMPRESSION: 1. Right middle lobe air space disease.  2. Increased right pleural effusion.  Original Report Authenticated By: Genevive Bi, M.D.   Chest CT with 1.1 cm LUL nodule, severe emphysema.  ASSESSMENT/PLAN:  1. Hemoptysis- source unclear on FOB from 11/13, all blood was on the R (small apical nodule is on the left). No endobronchial lesion noted but RML and RLL were occluded w clot - path, bx's confirm no malignancy.      PLAN:     - Bronched 11/13 @ 2PM (Annjeanette Sarwar) see report     - Plan to repeat FOB to get better look at RLL and RML on 11/15   2. COPD (chronic obstructive pulmonary disease). Maintaining Sats on RA.      PLAN:     -BD's  PRN     -O2 to keep sats > 92%     -cultures pending     -Rocephin and zithromax >> plan change to PO abx on 11/16 after FOB  3. LUL nodule     - will need separate evaluation, possibly ENB +/- PET scan  4. Tobacco abuse      PLAN:      -smoking cessation counseling   5. Kyphoscoliosis-noted on exam & xray     PLAN:     -no acute interventions needed at this time.   6. S/p thyroidectomy. TSH, free T 4 stable.       PLAN      -Stable. No further required  Will leave her on stepdown until we visualize her airways and insure no source of bleeding.   Tammy Wickliffe S. 08/02/2011 9:00  AM

## 2011-08-02 NOTE — Progress Notes (Signed)
08/02/11 1438  Vitals  Pulse Rate Source Monitor  ECG Heart Rate ! 110   Resp ! 26   BP ! 117/93 mmHg  SpO2 91 % (readjusted pox site, sats 94%)  Oxygen Therapy  O2 Device Nasal cannula  O2 Flow Rate (L/min) 6 L/min  Pulse Oximetry Type Continuous  SpO2 Alarm Limit Low 92   Pain Assessment  Pain Assessment No/denies pain  Pain Score Zero  LOC  Level of Consciousness Alert   start of recovery

## 2011-08-02 NOTE — Progress Notes (Addendum)
Respiratory Therapy -Bronchoscopy procedural vitals and recovery vitals are in the Shadow Chart. Vitals strip placed in progress note section of the physical chart.  No specimens were obtained during bronch procedure for lab testing.

## 2011-08-02 NOTE — Progress Notes (Signed)
08/02/11 1401  Vitals  Pulse Rate 92   Pulse Rate Source Monitor  ECG Heart Rate 92   Resp 16   BP 113/80 mmHg  SpO2 95 %  Oxygen Therapy  O2 Device Nasal cannula  O2 Flow Rate (L/min) 4 L/min  Pulse Oximetry Type Continuous  SpO2 Alarm Limit Low 92   Pain Assessment  Pain Assessment No/denies pain  LOC  Level of Consciousness Alert  Orientation Level Oriented X4   start of procdure

## 2011-08-02 NOTE — Interval H&P Note (Signed)
No changes since this am. No changes in exam. Will proceed with FOB  Anav Lammert S.

## 2011-08-02 NOTE — H&P (View-Only) (Signed)
HISTORY of PRESENT ILLNESS:  Valerie Mosley is a 60 y.o. female admitted on 07/29/2011 with Hemoptysis which began at 2AM, started with clots then progressed to frank blood.  Patient attempted to clear her secretion and manage at home til 11 AM where she could not tolerate additional coughing and had a near syncopal episode and decided to come to the ED.  In the ED, symptoms improved after a 6 hours observation period and patient was going to be discharged but had another episode of approximately 75-100 ml of frank hemoptysis.  At that point, PCCM was called on consultation.  CT of the chest was ordered that showed a LUL nodule and decision was made to admit patient.  No cardiac history, no sick contact, no wt loss, no recent travel and no personal history of or contact with TB.  No fever or recent episodes of infection/bronchitis.  No activity limitation.  Results for orders placed during the hospital encounter of 07/29/11  CULTURE, BLOOD (ROUTINE X 2)     Status: Normal (Preliminary result)   Collection Time   07/29/11  7:20 PM      Component Value Range Status Comment   Specimen Description BLOOD LEFT ARM   Final    Special Requests BOTTLES DRAWN AEROBIC AND ANAEROBIC 3CC   Final    Setup Time [id number]   Final    Culture     Final    Value:        BLOOD CULTURE RECEIVED NO GROWTH TO DATE CULTURE WILL BE HELD FOR 5 DAYS BEFORE ISSUING A FINAL NEGATIVE REPORT   Report Status PENDING   Incomplete   CULTURE, BLOOD (ROUTINE X 2)     Status: Normal (Preliminary result)   Collection Time   07/29/11  7:25 PM      Component Value Range Status Comment   Specimen Description BLOOD LEFT HAND   Final    Special Requests BOTTLES DRAWN AEROBIC AND ANAEROBIC 3CC   Final    Setup Time [id number]   Final    Culture     Final    Value:        BLOOD CULTURE RECEIVED NO GROWTH TO DATE CULTURE WILL BE HELD FOR 5 DAYS BEFORE ISSUING A FINAL NEGATIVE REPORT   Report Status PENDING   Incomplete   URINE  CULTURE     Status: Normal   Collection Time   07/29/11  8:21 PM      Component Value Range Status Comment   Specimen Description URINE, CLEAN CATCH   Final    Special Requests Normal   Final    Setup Time [id number]   Final    Colony Count 20,OOO COLONIES/ML   Final    Culture     Final    Value: Multiple bacterial morphotypes present, none predominant. Suggest appropriate recollection if clinically indicated.   Report Status 07/30/2011 FINAL   Final   MRSA PCR SCREENING     Status: Normal   Collection Time   07/30/11  2:39 AM      Component Value Range Status Comment   MRSA by PCR NEGATIVE  NEGATIVE  Final   CULTURE, RESPIRATORY     Status: Normal   Collection Time   07/30/11  8:15 AM      Component Value Range Status Comment   Specimen Description SPUTUM   Final    Special Requests Normal   Final    Gram Stain     Final      Value: RARE WBC PRESENT,BOTH PMN AND MONONUCLEAR     FEW SQUAMOUS EPITHELIAL CELLS PRESENT     RARE GRAM POSITIVE COCCI     IN PAIRS RARE GRAM POSITIVE RODS     RARE GRAM NEGATIVE RODS   Culture NORMAL OROPHARYNGEAL FLORA   Final    Report Status 08/01/2011 FINAL   Final   AFB CULTURE WITH SMEAR     Status: Normal (Preliminary result)   Collection Time   07/30/11  3:31 PM      Component Value Range Status Comment   Specimen Description BRONCHIAL ALVEOLAR LAVAGE   Final    Special Requests Normal   Final    ACID FAST SMEAR NO ACID FAST BACILLI SEEN   Final    Culture     Final    Value: CULTURE WILL BE EXAMINED FOR 6 WEEKS BEFORE ISSUING A FINAL REPORT   Report Status PENDING   Incomplete   CULTURE, BAL-QUANTITATIVE     Status: Normal   Collection Time   07/30/11  3:31 PM      Component Value Range Status Comment   Specimen Description BRONCHIAL ALVEOLAR LAVAGE   Final    Special Requests Normal   Final    Gram Stain     Final    Value: NO WBC SEEN     NO ORGANISMS SEEN   Colony Count 20,OOO COLONIES/ML   Final    Culture Non-Pathogenic  Oropharyngeal-type Flora Isolated.   Final    Report Status 08/02/2011 FINAL   Final   FUNGUS CULTURE W SMEAR     Status: Normal (Preliminary result)   Collection Time   07/30/11  3:31 PM      Component Value Range Status Comment   Specimen Description BRONCHIAL ALVEOLAR LAVAGE   Final    Special Requests Normal   Final    Fungal Smear NO YEAST OR FUNGAL ELEMENTS SEEN   Final    Culture CULTURE IN PROGRESS FOR FOUR WEEKS   Final    Report Status PENDING   Incomplete   antibiotics 11/12 Roc>> 11/12 zithro>>  S:  Reports significant decrease in hemoptysis and old clots.  Path from FOB 11/13 shows fibrin clot, no malignancy Has had some burning in L arm when she gets her abx   PHYICAL EXAM:  Blood pressure 108/79, pulse 92, temperature 98.1 F (36.7 C), temperature source Oral, resp. rate 30, height 5' 5" (1.651 m), weight 57.6 kg (126 lb 15.8 oz), SpO2 93.00%.  General:  Well appearing female in no acute distress. Talkative. Decrease in hemoptysis. Neuro: A&O x3, moving all extremities. CV: RRR, Nl S1/S2, -M/R/G. PULM:  Lung sounds improved /c symmetrical excursion.  Bloody sputum decreased. GI: Soft, NT, ND and +BS. Extremities: -edema and -tenderness.  Lab Results  Component Value Date   WBC 8.6 08/01/2011   HGB 11.8* 08/01/2011   HCT 36.2 08/01/2011   MCV 93.3 08/01/2011   PLT 242 08/01/2011  ,  Lab Results  Component Value Date   CREATININE 0.61 08/01/2011   BUN 7 08/01/2011   NA 138 08/01/2011   K 3.5 08/01/2011   CL 106 08/01/2011   CO2 25 08/01/2011  ,  Lab Results  Component Value Date   ALT 16 07/29/2011   AST 20 07/29/2011   ALKPHOS 156* 07/29/2011   BILITOT 0.3 07/29/2011    Dg Chest Port 1 View  08/02/2011  *RADIOLOGY REPORT*  Clinical Data: Hemoptysis.  COPD.  PORTABLE CHEST - 1   VIEW  Comparison: 08/01/2011.  Findings: Right lower lobe airspace opacity remains present. Probable positional shift in the right pleural effusion. Underlying emphysema  is present.  Cardiopericardial silhouette is unchanged. Monitoring leads are projected over the chest.  IMPRESSION: No interval change and right middle lobe airspace disease and right pleural effusion.  Underlying emphysema.  Original Report Authenticated By: GEOFFREY LAMKE, M.D.   Dg Chest Port 1 View  08/01/2011  *RADIOLOGY REPORT*  Clinical Data: Hemoptysis  PORTABLE CHEST - 1 VIEW  Comparison: Chest radiograph 07/31/2011  Findings: Normal mediastinum and cardiac silhouette.  There is slight increase in the right pleural effusion. The right heart border is poorly visualized secondary to right middle lobe air space disease.  Chronic bronchitic markings.  IMPRESSION: 1. Right middle lobe air space disease.  2. Increased right pleural effusion.  Original Report Authenticated By: STEWART EDMUNDS, M.D.   Chest CT with 1.1 cm LUL nodule, severe emphysema.  ASSESSMENT/PLAN:  1. Hemoptysis- source unclear on FOB from 11/13, all blood was on the R (small apical nodule is on the left). No endobronchial lesion noted but RML and RLL were occluded w clot - path, bx's confirm no malignancy.      PLAN:     - Bronched 11/13 @ 2PM (Byrum) see report     - Plan to repeat FOB to get better look at RLL and RML on 11/15   2. COPD (chronic obstructive pulmonary disease). Maintaining Sats on RA.      PLAN:     -BD's  PRN     -O2 to keep sats > 92%     -cultures pending     -Rocephin and zithromax >> plan change to PO abx on 11/16 after FOB  3. LUL nodule     - will need separate evaluation, possibly ENB +/- PET scan  4. Tobacco abuse      PLAN:      -smoking cessation counseling   5. Kyphoscoliosis-noted on exam & xray     PLAN:     -no acute interventions needed at this time.   6. S/p thyroidectomy. TSH, free T 4 stable.       PLAN      -Stable. No further required  Will leave her on stepdown until we visualize her airways and insure no source of bleeding.   BYRUM,ROBERT S. 08/02/2011 9:00  AM  

## 2011-08-03 LAB — OCCULT BLOOD X 1 CARD TO LAB, STOOL: Fecal Occult Bld: POSITIVE

## 2011-08-03 MED ORDER — AMLODIPINE BESYLATE 10 MG PO TABS
10.0000 mg | ORAL_TABLET | Freq: Every day | ORAL | Status: DC
  Administered 2011-08-03 – 2011-08-05 (×3): 10 mg via ORAL
  Filled 2011-08-03 (×4): qty 1

## 2011-08-03 MED ORDER — MOXIFLOXACIN HCL 400 MG PO TABS
400.0000 mg | ORAL_TABLET | Freq: Every day | ORAL | Status: DC
  Administered 2011-08-03 – 2011-08-04 (×2): 400 mg via ORAL
  Filled 2011-08-03 (×3): qty 1

## 2011-08-03 NOTE — Progress Notes (Signed)
HISTORY of PRESENT ILLNESS:  Valerie Mosley is a 60 y.o. female admitted on 07/29/2011 with Hemoptysis which began at 2AM, started with clots then progressed to frank blood.  Patient attempted to clear her secretion and manage at home til 11 AM where she could not tolerate additional coughing and had a near syncopal episode and decided to come to the ED.  In the ED, symptoms improved after a 6 hours observation period and patient was going to be discharged but had another episode of approximately 75-100 ml of frank hemoptysis.  At that point, PCCM was called on consultation.  CT of the chest was ordered that showed a LUL nodule and decision was made to admit patient.  No cardiac history, no sick contact, no wt loss, no recent travel and no personal history of or contact with TB.  No fever or recent episodes of infection/bronchitis.  No activity limitation.  Results for orders placed during the hospital encounter of 07/29/11  CULTURE, BLOOD (ROUTINE X 2)     Status: Normal (Preliminary result)   Collection Time   07/29/11  7:20 PM      Component Value Range Status Comment   Specimen Description BLOOD LEFT ARM   Final    Special Requests BOTTLES DRAWN AEROBIC AND ANAEROBIC 3CC   Final    Setup Time [id number]   Final    Culture     Final    Value:        BLOOD CULTURE RECEIVED NO GROWTH TO DATE CULTURE WILL BE HELD FOR 5 DAYS BEFORE ISSUING A FINAL NEGATIVE REPORT   Report Status PENDING   Incomplete   CULTURE, BLOOD (ROUTINE X 2)     Status: Normal (Preliminary result)   Collection Time   07/29/11  7:25 PM      Component Value Range Status Comment   Specimen Description BLOOD LEFT HAND   Final    Special Requests BOTTLES DRAWN AEROBIC AND ANAEROBIC 3CC   Final    Setup Time [id number]   Final    Culture     Final    Value:        BLOOD CULTURE RECEIVED NO GROWTH TO DATE CULTURE WILL BE HELD FOR 5 DAYS BEFORE ISSUING A FINAL NEGATIVE REPORT   Report Status PENDING   Incomplete   URINE  CULTURE     Status: Normal   Collection Time   07/29/11  8:21 PM      Component Value Range Status Comment   Specimen Description URINE, CLEAN CATCH   Final    Special Requests Normal   Final    Setup Time [id number]   Final    Colony Count 20,OOO COLONIES/ML   Final    Culture     Final    Value: Multiple bacterial morphotypes present, none predominant. Suggest appropriate recollection if clinically indicated.   Report Status 07/30/2011 FINAL   Final   MRSA PCR SCREENING     Status: Normal   Collection Time   07/30/11  2:39 AM      Component Value Range Status Comment   MRSA by PCR NEGATIVE  NEGATIVE  Final   CULTURE, RESPIRATORY     Status: Normal   Collection Time   07/30/11  8:15 AM      Component Value Range Status Comment   Specimen Description SPUTUM   Final    Special Requests Normal   Final    Gram Stain     Final  Value: RARE WBC PRESENT,BOTH PMN AND MONONUCLEAR     FEW SQUAMOUS EPITHELIAL CELLS PRESENT     RARE GRAM POSITIVE COCCI     IN PAIRS RARE GRAM POSITIVE RODS     RARE GRAM NEGATIVE RODS   Culture NORMAL OROPHARYNGEAL FLORA   Final    Report Status 08/01/2011 FINAL   Final   AFB CULTURE WITH SMEAR     Status: Normal (Preliminary result)   Collection Time   07/30/11  3:31 PM      Component Value Range Status Comment   Specimen Description BRONCHIAL ALVEOLAR LAVAGE   Final    Special Requests Normal   Final    ACID FAST SMEAR NO ACID FAST BACILLI SEEN   Final    Culture     Final    Value: CULTURE WILL BE EXAMINED FOR 6 WEEKS BEFORE ISSUING A FINAL REPORT   Report Status PENDING   Incomplete   CULTURE, BAL-QUANTITATIVE     Status: Normal   Collection Time   07/30/11  3:31 PM      Component Value Range Status Comment   Specimen Description BRONCHIAL ALVEOLAR LAVAGE   Final    Special Requests Normal   Final    Gram Stain     Final    Value: NO WBC SEEN     NO ORGANISMS SEEN   Colony Count 20,OOO COLONIES/ML   Final    Culture Non-Pathogenic  Oropharyngeal-type Flora Isolated.   Final    Report Status 08/02/2011 FINAL   Final   FUNGUS CULTURE W SMEAR     Status: Normal (Preliminary result)   Collection Time   07/30/11  3:31 PM      Component Value Range Status Comment   Specimen Description BRONCHIAL ALVEOLAR LAVAGE   Final    Special Requests Normal   Final    Fungal Smear NO YEAST OR FUNGAL ELEMENTS SEEN   Final    Culture CULTURE IN PROGRESS FOR FOUR WEEKS   Final    Report Status PENDING   Incomplete   antibiotics 11/12 Roc>>11/7 11/12 zithro>>11/17 11/17 avelox po>>  S:  No issues over night, Path from FOB 11/13 shows fibrin clot, no malignancy Has had some burning in L arm when she gets her abx  Fob done 11/16: blood in RML and RLL, no endobronch lesions  PHYICAL EXAM:  Blood pressure 106/72, pulse 68, temperature 98.9 F (37.2 C), temperature source Oral, resp. rate 19, height 5\' 5"  (1.651 m), weight 58.4 kg (128 lb 12 oz), SpO2 95.00%.  General:  Well appearing female in no acute distress. Talkative. Decrease in hemoptysis. Neuro: A&O x3, moving all extremities. CV: RRR, Nl S1/S2, -M/R/G. PULM:  Lung sounds improved /c symmetrical excursion.  Bloody sputum decreased. GI: Soft, NT, ND and +BS. Extremities: -edema and -tenderness.  Lab Results  Component Value Date   WBC 8.6 08/01/2011   HGB 11.8* 08/01/2011   HCT 36.2 08/01/2011   MCV 93.3 08/01/2011   PLT 242 08/01/2011  ,  Lab Results  Component Value Date   CREATININE 0.61 08/01/2011   BUN 7 08/01/2011   NA 138 08/01/2011   K 3.5 08/01/2011   CL 106 08/01/2011   CO2 25 08/01/2011  ,  Lab Results  Component Value Date   ALT 16 07/29/2011   AST 20 07/29/2011   ALKPHOS 156* 07/29/2011   BILITOT 0.3 07/29/2011    Dg Chest Port 1 View  08/02/2011  *RADIOLOGY REPORT*  Clinical  Data: Hemoptysis.  COPD.  PORTABLE CHEST - 1 VIEW  Comparison: 08/01/2011.  Findings: Right lower lobe airspace opacity remains present. Probable positional shift in  the right pleural effusion. Underlying emphysema is present.  Cardiopericardial silhouette is unchanged. Monitoring leads are projected over the chest.  IMPRESSION: No interval change and right middle lobe airspace disease and right pleural effusion.  Underlying emphysema.  Original Report Authenticated By: Andreas Newport, M.D.   Dg Bronchi Uni  08/02/2011  CLINICAL DATA: bronch with biopsy   BRONCHO UNI FLUORO  Fluoroscopy was utilized by the requesting physician.  No radiographic  interpretation.     Chest CT with 1.1 cm LUL nodule, severe emphysema.  ASSESSMENT/PLAN:  1. Hemoptysis- source unclear on FOB from 11/13, all blood was on the R (small apical nodule is on the left). No endobronchial lesion noted but RML and RLL were occluded w clot - path, bx's confirm no malignancy.      PLAN:     - Bronched 11/13 @ 2PM (Byrum) see report  -repeat fob 11/16 no endobronch lesions       2. COPD (chronic obstructive pulmonary disease). Maintaining Sats on RA.      PLAN:     -BD's  PRN     -O2 to keep sats > 92%     -cultures pending     -Rocephin and zithromax >> plan change to PO abx on 11/17 Avelox  3. LUL nodule     - will need separate evaluation, possibly ENB +/- PET scan  4. Tobacco abuse      PLAN:      -smoking cessation counseling   5. Kyphoscoliosis-noted on exam & xray     PLAN:     -no acute interventions needed at this time.   6. S/p thyroidectomy. TSH, free T 4 stable.       PLAN      -Stable. No further required  Tfr to floor. 11/17 Shan Levans 08/03/2011 8:36 AM

## 2011-08-04 ENCOUNTER — Inpatient Hospital Stay (HOSPITAL_COMMUNITY): Payer: BC Managed Care – PPO

## 2011-08-04 DIAGNOSIS — R042 Hemoptysis: Secondary | ICD-10-CM

## 2011-08-04 DIAGNOSIS — J449 Chronic obstructive pulmonary disease, unspecified: Secondary | ICD-10-CM

## 2011-08-04 DIAGNOSIS — R0902 Hypoxemia: Secondary | ICD-10-CM

## 2011-08-04 DIAGNOSIS — R918 Other nonspecific abnormal finding of lung field: Secondary | ICD-10-CM

## 2011-08-04 LAB — CULTURE, BLOOD (ROUTINE X 2)
Culture  Setup Time: 201211122337
Culture  Setup Time: 201211122337
Culture: NO GROWTH
Culture: NO GROWTH

## 2011-08-04 LAB — CBC
HCT: 35.8 % — ABNORMAL LOW (ref 36.0–46.0)
Hemoglobin: 12.1 g/dL (ref 12.0–15.0)
MCH: 30.9 pg (ref 26.0–34.0)
MCHC: 33.8 g/dL (ref 30.0–36.0)
MCV: 91.3 fL (ref 78.0–100.0)
Platelets: 270 10*3/uL (ref 150–400)
RBC: 3.92 MIL/uL (ref 3.87–5.11)
RDW: 12.8 % (ref 11.5–15.5)
WBC: 6.4 10*3/uL (ref 4.0–10.5)

## 2011-08-04 LAB — BASIC METABOLIC PANEL
BUN: 17 mg/dL (ref 6–23)
CO2: 26 mEq/L (ref 19–32)
Calcium: 8.9 mg/dL (ref 8.4–10.5)
Chloride: 106 mEq/L (ref 96–112)
Creatinine, Ser: 0.76 mg/dL (ref 0.50–1.10)
GFR calc Af Amer: >90 mL/min (ref >90)
GFR calc non Af Amer: 89 mL/min — ABNORMAL LOW (ref >90)
Glucose, Bld: 97 mg/dL (ref 70–99)
Potassium: 3.9 mEq/L (ref 3.5–5.1)
Sodium: 139 mEq/L (ref 135–145)

## 2011-08-04 MED ORDER — SODIUM CHLORIDE 0.9 % IV SOLN: 250.0000 mL | INTRAVENOUS | Status: DC

## 2011-08-04 MED ORDER — SODIUM CHLORIDE 0.9 % IJ SOLN: 3.0000 mL | INTRAMUSCULAR | Status: DC | PRN

## 2011-08-04 MED ORDER — SODIUM CHLORIDE 0.9 % IJ SOLN
3.0000 mL | Freq: Two times a day (BID) | INTRAMUSCULAR | Status: DC
  Administered 2011-08-03 – 2011-08-05 (×4): 3 mL via INTRAVENOUS

## 2011-08-04 NOTE — Plan of Care (Signed)
Problem: Phase III Progression Outcomes Goal: Activity at appropriate level-compared to baseline (UP IN CHAIR FOR HEMODIALYSIS)  Outcome: Completed/Met Date Met:  08/04/11 Pt up as tol in room and in hallway, gait steady

## 2011-08-04 NOTE — Progress Notes (Signed)
HISTORY of PRESENT ILLNESS:  Valerie Mosley is a 60 y.o. female admitted on 07/29/2011 with Hemoptysis which began at 2AM, started with clots then progressed to frank blood.  Patient attempted to clear her secretion and manage at home til 11 AM where she could not tolerate additional coughing and had a near syncopal episode and decided to come to the ED.  In the ED, symptoms improved after a 6 hours observation period and patient was going to be discharged but had another episode of approximately 75-100 ml of frank hemoptysis.  At that point, PCCM was called on consultation.  CT of the chest was ordered that showed a LUL nodule and decision was made to admit patient.  No cardiac history, no sick contact, no wt loss, no recent travel and no personal history of or contact with TB.  No fever or recent episodes of infection/bronchitis.  No activity limitation.  Results for orders placed during the hospital encounter of 07/29/11  CULTURE, BLOOD (ROUTINE X 2)     Status: Normal   Collection Time   07/29/11  7:20 PM      Component Value Range Status Comment   Specimen Description BLOOD LEFT ARM   Final    Special Requests BOTTLES DRAWN AEROBIC AND ANAEROBIC 3CC   Final    Setup Time [id number]   Final    Culture NO GROWTH 5 DAYS   Final    Report Status 08/04/2011 FINAL   Final   CULTURE, BLOOD (ROUTINE X 2)     Status: Normal   Collection Time   07/29/11  7:25 PM      Component Value Range Status Comment   Specimen Description BLOOD LEFT HAND   Final    Special Requests BOTTLES DRAWN AEROBIC AND ANAEROBIC 3CC   Final    Setup Time [id number]   Final    Culture NO GROWTH 5 DAYS   Final    Report Status 08/04/2011 FINAL   Final   URINE CULTURE     Status: Normal   Collection Time   07/29/11  8:21 PM      Component Value Range Status Comment   Specimen Description URINE, CLEAN CATCH   Final    Special Requests Normal   Final    Setup Time [id number]   Final    Colony Count 20,OOO  COLONIES/ML   Final    Culture     Final    Value: Multiple bacterial morphotypes present, none predominant. Suggest appropriate recollection if clinically indicated.   Report Status 07/30/2011 FINAL   Final   MRSA PCR SCREENING     Status: Normal   Collection Time   07/30/11  2:39 AM      Component Value Range Status Comment   MRSA by PCR NEGATIVE  NEGATIVE  Final   CULTURE, RESPIRATORY     Status: Normal   Collection Time   07/30/11  8:15 AM      Component Value Range Status Comment   Specimen Description SPUTUM   Final    Special Requests Normal   Final    Gram Stain     Final    Value: RARE WBC PRESENT,BOTH PMN AND MONONUCLEAR     FEW SQUAMOUS EPITHELIAL CELLS PRESENT     RARE GRAM POSITIVE COCCI     IN PAIRS RARE GRAM POSITIVE RODS     RARE GRAM NEGATIVE RODS   Culture NORMAL OROPHARYNGEAL FLORA   Final    Report Status  08/01/2011 FINAL   Final   AFB CULTURE WITH SMEAR     Status: Normal (Preliminary result)   Collection Time   07/30/11  3:31 PM      Component Value Range Status Comment   Specimen Description BRONCHIAL ALVEOLAR LAVAGE   Final    Special Requests Normal   Final    ACID FAST SMEAR NO ACID FAST BACILLI SEEN   Final    Culture     Final    Value: CULTURE WILL BE EXAMINED FOR 6 WEEKS BEFORE ISSUING A FINAL REPORT   Report Status PENDING   Incomplete   CULTURE, BAL-QUANTITATIVE     Status: Normal   Collection Time   07/30/11  3:31 PM      Component Value Range Status Comment   Specimen Description BRONCHIAL ALVEOLAR LAVAGE   Final    Special Requests Normal   Final    Gram Stain     Final    Value: NO WBC SEEN     NO ORGANISMS SEEN   Colony Count 20,OOO COLONIES/ML   Final    Culture Non-Pathogenic Oropharyngeal-type Flora Isolated.   Final    Report Status 08/02/2011 FINAL   Final   FUNGUS CULTURE W SMEAR     Status: Normal (Preliminary result)   Collection Time   07/30/11  3:31 PM      Component Value Range Status Comment   Specimen Description  BRONCHIAL ALVEOLAR LAVAGE   Final    Special Requests Normal   Final    Fungal Smear NO YEAST OR FUNGAL ELEMENTS SEEN   Final    Culture CULTURE IN PROGRESS FOR FOUR WEEKS   Final    Report Status PENDING   Incomplete   antibiotics 11/12 Roc>>11/7 11/12 zithro>>11/17 11/17 avelox po>>  Subjective: No issues over night, Path from FOB 11/13 shows fibrin clot, no malignancy Has had some burning in L arm when she gets her abx  Fob done 11/16: blood in RML and RLL, no endobronch lesions  PHYICAL EXAM:  Blood pressure 119/78, pulse 72, temperature 98 F (36.7 C), temperature source Oral, resp. rate 16, height 5\' 5"  (1.651 m), weight 58.4 kg (128 lb 12 oz), SpO2 92.00%.  General:  Well appearing female in no acute distress. Talkative. Decrease in hemoptysis. Neuro: A&O x3, moving all extremities. CV: RRR, Nl S1/S2, -M/R/G. PULM:  Lung sounds improved /c symmetrical excursion.  Bloody sputum decreased. GI: Soft, NT, ND and +BS. Extremities: -edema and -tenderness.  Lab Results  Component Value Date   WBC 6.4 08/04/2011   HGB 12.1 08/04/2011   HCT 35.8* 08/04/2011   MCV 91.3 08/04/2011   PLT 270 08/04/2011  ,  Lab Results  Component Value Date   CREATININE 0.76 08/04/2011   BUN 17 08/04/2011   NA 139 08/04/2011   K 3.9 08/04/2011   CL 106 08/04/2011   CO2 26 08/04/2011  ,  Lab Results  Component Value Date   ALT 16 07/29/2011   AST 20 07/29/2011   ALKPHOS 156* 07/29/2011   BILITOT 0.3 07/29/2011    Dg Chest 2 View  08/04/2011  *RADIOLOGY REPORT*  Clinical Data: Follow up pneumonia  CHEST - 2 VIEW  Comparison: 08/02/2011  Findings: Right middle lobe pneumonia, improved.  Right pleural effusion has resolved.  Underlying emphysematous changes.  No pneumothorax.  Cardiomediastinal silhouette is within normal limits.  Mild degenerative changes of the visualized thoracolumbar spine.  IMPRESSION: Right middle lobe pneumonia, improved.  Right pleural  effusion has resolved.   Original Report Authenticated By: Charline Bills, M.D.   Dg Bronchi Uni  08/02/2011  CLINICAL DATA: bronch with biopsy   BRONCHO UNI FLUORO  Fluoroscopy was utilized by the requesting physician.  No radiographic  interpretation.     Chest CT with 1.1 cm LUL nodule, severe emphysema.  ASSESSMENT/PLAN:  1. Hemoptysis- source unclear on FOB from 11/13, all blood was on the R (small apical nodule is on the left). No endobronchial lesion noted but RML and RLL were occluded w clot - path, bx's confirm no malignancy.  NOTE: Hg stable ~12     PLAN:     - Bronched 11/13 @ 2PM (Byrum) see report  -repeat fob 11/16 no endobronch lesions       2. COPD (chronic obstructive pulmonary disease). Maintaining Sats on RA.      PLAN:     -BD's  PRN     -O2 to keep sats > 92%     -cultures pending     -Rocephin and zithromax >> plan change to PO abx on 11/17 Avelox  3. LUL nodule     - will need separate evaluation, possibly ENB +/- PET scan  4. Tobacco abuse      PLAN:      -smoking cessation counseling   5. Kyphoscoliosis-noted on exam & xray     PLAN:     -no acute interventions needed at this time.   6. S/p thyroidectomy. TSH, free T 4 stable.       PLAN      -Stable. No further required  Tfr to floor. 11/17  Almarie Kurdziel M 08/04/2011 2:23 PM

## 2011-08-05 ENCOUNTER — Encounter (HOSPITAL_COMMUNITY): Payer: Self-pay

## 2011-08-05 ENCOUNTER — Inpatient Hospital Stay: Payer: BC Managed Care – PPO | Admitting: Internal Medicine

## 2011-08-05 MED ORDER — ALBUTEROL SULFATE (5 MG/ML) 0.5% IN NEBU: 2.5000 mg | INHALATION_SOLUTION | RESPIRATORY_TRACT | Status: DC | PRN

## 2011-08-05 MED ORDER — MOXIFLOXACIN HCL 400 MG PO TABS: 400.0000 mg | ORAL_TABLET | Freq: Every day | ORAL | Status: DC

## 2011-08-05 MED ORDER — ACYCLOVIR 5 % EX OINT: TOPICAL_OINTMENT | CUTANEOUS | Status: DC

## 2011-08-05 NOTE — Discharge Summary (Signed)
Physician Discharge Summary  Patient ID: Valerie Mosley MRN: [id number] DOB/AGE: 62/25/1952 62 y.o.  Admit date: 07/29/2011 Discharge date: 08/05/2011  Admission Diagnoses: Hemoptysis  Discharge Diagnoses:  Principal Problem:  *Hemoptysis Active Problems:  COPD (chronic obstructive pulmonary disease)  Tobacco abuse  Kyphoscoliosis   Valerie Mosley is a 60 y.o. female admitted on 07/29/2011 with Hemoptysis which began at 2AM, started with clots then progressed to frank blood. Patient attempted to clear her secretion and manage at home til 11 AM where she could not tolerate additional coughing and had a near syncopal episode and decided to come to the ED. In the ED, symptoms improved after a 6 hours observation period and patient was going to be discharged but had another episode of approximately 75-100 ml of frank hemoptysis. At that point, PCCM was called on consultation. CT of the chest was ordered that showed a LUL nodule and decision was made to admit patient. No cardiac history, no sick contact, no wt loss, no recent travel and no personal history of or contact with TB. No fever or recent episodes of infection/bronchitis. No activity limitation.    Hospital Course:  Principal Problem:  1) Hemoptysis in setting of COPD (chronic obstructive pulmonary disease) and probable CAP (NOS).  Hemoptysis- source unclear on FOB from 11/13, all blood was on the R (small apical nodule is on the left). No endobronchial lesion noted but RML and RLL were occluded w clot - path, bx's confirm no malignancy. NOTE: Hg stable ~12. Ultimately this was treated as a pneumonia (no organism specified). She clinically improved with empiric antibiotics. She'll be discharged to home with 5 more days of Avelox. She's already completed empirical therapy with azithromycin and Rocephin which was started on admit date 11/12. She has a known left upper lobe nodule, in the past bronchoscopy this was not felt to be  etiology of bleeding. This will need to be reevaluated in the future. She'll be seen in hospital followup with our nurse practitioner, and further followup with Dr. Delton Coombes in an outpatient setting for upper lobe nodule.  2)Tobacco abuse  3) Kyphoscoliosis   Discharge Exam:  Temp:  [98.4 F (36.9 C)-98.5 F (36.9 C)] 98.4 F (36.9 C) (11/19 0535) Pulse Rate:  [74-76] 74  (11/19 0535) Resp:  [16-18] 18  (11/19 0535) BP: (102-104)/(65-68) 102/65 mmHg (11/19 0535) SpO2:  [94 %-96 %] 96 % (11/19 0535) Weight:  [58.242 kg (128 lb 6.4 oz)] 128 lb 6.4 oz (58.242 kg) (11/19 0535) Gen.: Currently no acute distress HEENT: Normocephalic no JVD: Pulmonary: No wheezing rhonchi or rales  equal breath sounds. Cardiac: Regular rate and rhythm. Abdomen: Soft nontender extremities: No edema.  Labs at discharge Lab Results  Component Value Date   CREATININE 0.76 08/04/2011   BUN 17 08/04/2011   NA 139 08/04/2011   K 3.9 08/04/2011   CL 106 08/04/2011   CO2 26 08/04/2011   Lab Results  Component Value Date   WBC 6.4 08/04/2011   HGB 12.1 08/04/2011   HCT 35.8* 08/04/2011   MCV 91.3 08/04/2011   PLT 270 08/04/2011   Lab Results  Component Value Date   ALT 16 07/29/2011   AST 20 07/29/2011   ALKPHOS 156* 07/29/2011   BILITOT 0.3 07/29/2011   Lab Results  Component Value Date   INR 1.09 08/01/2011   INR 1.02 07/29/2011    Current radiology studies @[id number] @  Disposition:    Discharge Orders    Future Appointments: Provider: Department: Dept  Phone: Center:   08/13/2011 9:45 AM Rubye Oaks, NP [facility] [phone] None   08/30/2011 3:00 PM Leslye Peer, MD [facility] [phone] None     Future Orders Please Complete By Expires   Diet - low sodium heart healthy      Increase activity slowly      Call MD for:      Call MD for:  temperature >100.4      Call MD for:  redness, tenderness, or signs of infection (pain, swelling, redness, odor or green/yellow  discharge around incision site)      Call MD for:  difficulty breathing, headache or visual disturbances      Call MD for:  extreme fatigue        Current Discharge Medication List    START taking these medications   Details  acyclovir ointment (ZOVIRAX) 5 % Apply topically every 3 (three) hours. Apply to lips for 4 days. Qty: 15 g, Refills: 0    albuterol (PROVENTIL) (5 MG/ML) 0.5% nebulizer solution Take 0.5 mLs (2.5 mg total) by nebulization every 3 (three) hours as needed for wheezing or shortness of breath. Qty: 20 mL, Refills: 6    moxifloxacin (AVELOX) 400 MG tablet Take 1 tablet (400 mg total) by mouth daily at 6 PM. Qty: 5 tablet, Refills: 0      CONTINUE these medications which have NOT CHANGED   Details  amLODipine (NORVASC) 10 MG tablet Take 10 mg by mouth daily.      diazepam (VALIUM) 5 MG tablet Take 5 mg by mouth daily as needed. Anxiety         Follow-up Information    Follow up with PARRETT,TAMMY, NP on 08/13/2011. (0945, then with Dr Delton Coombes dec 14th at 3pm)    Contact information:   [facility] [address] [place] [address] [phone]          Discharged Condition: good  Signed: BABCOCK,PETE 08/05/2011, 4:02 PM

## 2011-08-05 NOTE — Progress Notes (Signed)
Pt discharged home with spouse in stable condition. Discharge instructions and scripts given. Pt and spouse verbalized understanding. 

## 2011-08-13 ENCOUNTER — Ambulatory Visit (INDEPENDENT_AMBULATORY_CARE_PROVIDER_SITE_OTHER)
Admission: RE | Admit: 2011-08-13 | Discharge: 2011-08-13 | Disposition: A | Discharge status: Home / Self Care | Payer: BC Managed Care – PPO | Source: Ambulatory Visit | Attending: Adult Health | Admitting: Adult Health

## 2011-08-13 ENCOUNTER — Ambulatory Visit (INDEPENDENT_AMBULATORY_CARE_PROVIDER_SITE_OTHER): Payer: BC Managed Care – PPO | Admitting: Adult Health

## 2011-08-13 ENCOUNTER — Encounter: Payer: Self-pay | Admitting: Adult Health

## 2011-08-13 ENCOUNTER — Telehealth: Payer: Self-pay | Admitting: Adult Health

## 2011-08-13 DIAGNOSIS — J984 Other disorders of lung: Secondary | ICD-10-CM

## 2011-08-13 DIAGNOSIS — J449 Chronic obstructive pulmonary disease, unspecified: Secondary | ICD-10-CM

## 2011-08-13 DIAGNOSIS — R911 Solitary pulmonary nodule: Secondary | ICD-10-CM | POA: Insufficient documentation

## 2011-08-13 DIAGNOSIS — R042 Hemoptysis: Secondary | ICD-10-CM

## 2011-08-13 NOTE — Patient Instructions (Signed)
Most important goal is to quit smoking.  I will call with xray results.  follow up Dr. Delton Coombes as planned in 2 weeks and As needed   Please contact office for sooner follow up if symptoms do not improve or worsen or seek emergency care

## 2011-08-13 NOTE — Progress Notes (Signed)
  Subjective:    Patient ID: Valerie Mosley, female    DOB: Jul 30, 1951, 60 y.o.   MRN: [id number]  HPI 60 yo WF , active smoker,  seen for initial pulmonary consult during hospital stay for hemoptysis 62/12/12 >tx for presumed CAP   08/13/2011 Post Hospital  Admitted 11/12-11/19/12 for hemoptysis with underling presumed COPD and supsected CAP. CT chest showed 1. 1.1 cm left upper lobe mass, Patchy density in the right upper lobe and minimal patchy density in the right lower lobe  She underwent  FOB from 11/13, all blood was on the R (small apical nodule is on the left). No endobronchial lesion noted but RML and RLL were occluded w clot - path, bx's confirm no malignancy. Tx for  pneumonia (no organism specified) with clinical improvement with abx, discharged on Avelox .   She has a known hx of  left upper lobe nodule with prev. FOB  that was neg for per pt.    Since discharge she is feeling much better with decreased cough and congesiton . No further hemopytsis. She is trying to cut back on smoking.     Review of Systems Constitutional:   No  weight loss, night sweats,  Fevers, chills,  +fatigue, or  lassitude.  HEENT:   No headaches,  Difficulty swallowing,  Tooth/dental problems, or  Sore throat,                No sneezing, itching, ear ache, nasal congestion, post nasal drip,   CV:  No chest pain,  Orthopnea, PND, swelling in lower extremities, anasarca, dizziness, palpitations, syncope.   GI  No heartburn, indigestion, abdominal pain, nausea, vomiting, diarrhea, change in bowel habits, loss of appetite, bloody stools.   Resp:   No coughing up of blood.    No chest wall deformity  Skin: no rash or lesions.  GU: no dysuria, change in color of urine, no urgency or frequency.  No flank pain, no hematuria   MS:  No joint pain or swelling.  No decreased range of motion.  No back pain.  Psych:  No change in mood or affect. No depression or anxiety.  No memory loss.           Objective:   Physical Exam GEN: A/Ox3; pleasant , NAD, well nourished   HEENT:  [place]/AT,  EACs-clear, TMs-wnl, NOSE-clear, THROAT-clear, no lesions, no postnasal drip or exudate noted.   NECK:  Supple w/ fair ROM; no JVD; normal carotid impulses w/o bruits; no thyromegaly or nodules palpated; no lymphadenopathy.  RESP  Clear  P & A; w/o, wheezes/ rales/ or rhonchi.no accessory muscle use, no dullness to percussion  CARD:  RRR, no m/r/g  , no peripheral edema, pulses intact, no cyanosis or clubbing.  GI:   Soft & nt; nml bowel sounds; no organomegaly or masses detected.  Musco: Warm bil, no deformities or joint swelling noted.   Neuro: alert, no focal deficits noted.    Skin: Warm, no lesions or rashes         Assessment & Plan:

## 2011-08-13 NOTE — Assessment & Plan Note (Signed)
Presumed COPD in active smoker , will need PFT in future  No change in therapy

## 2011-08-13 NOTE — Telephone Encounter (Signed)
Pt seen by TP today for HFU for follow up hemoptysis / PNA.  Called spoke with patient who is requesting to know what she can take for a HA.  Advised patient to avoid NSAIDs d/t her recent history of hemoptysis and advised that she may use tylenol as needed for HA.  Pt okay with this and verbalized her understanding.

## 2011-08-13 NOTE — Assessment & Plan Note (Signed)
Presumed Right sided CAP w/ NOS found on FOB- path was neg       Clinically improved , check xray today.  follow up Dr. Delton Coombes in 2 weeks.  Encouraged on smoking cesstation

## 2011-08-26 LAB — FUNGUS CULTURE W SMEAR
Fungal Smear: NONE SEEN
Special Requests: NORMAL

## 2011-08-30 ENCOUNTER — Ambulatory Visit (INDEPENDENT_AMBULATORY_CARE_PROVIDER_SITE_OTHER): Payer: BC Managed Care – PPO | Admitting: Emergency Medicine

## 2011-08-30 ENCOUNTER — Encounter: Payer: Self-pay | Admitting: Emergency Medicine

## 2011-08-30 DIAGNOSIS — J984 Other disorders of lung: Secondary | ICD-10-CM

## 2011-08-30 DIAGNOSIS — J449 Chronic obstructive pulmonary disease, unspecified: Secondary | ICD-10-CM

## 2011-08-30 DIAGNOSIS — R911 Solitary pulmonary nodule: Secondary | ICD-10-CM

## 2011-08-30 NOTE — Assessment & Plan Note (Signed)
SABA prn for now Full PFT ROV next available after Ct

## 2011-08-30 NOTE — Progress Notes (Signed)
  Subjective:    Patient ID: Valerie Mosley, female    DOB: 12-01-1950, 60 y.o.   MRN: [id number]  HPI 60 yo WF , active smoker,  seen for initial pulmonary consult during hospital stay for hemoptysis 07/29/11 >tx for presumed CAP   08/13/2011 Post Hospital  Admitted 11/12-11/19/12 for hemoptysis with underling presumed COPD and supsected CAP. CT chest showed 1. 1.1 cm left upper lobe mass, Patchy density in the right upper lobe and minimal patchy density in the right lower lobe  She underwent  FOB from 11/13, all blood was on the R (small apical nodule is on the left). No endobronchial lesion noted but RML and RLL were occluded w clot - path, bx's confirm no malignancy. Tx for  pneumonia (no organism specified) with clinical improvement with abx, discharged on Avelox .   She has a known hx of  left upper lobe nodule with prev. FOB  that was neg for per pt.    Since discharge she is feeling much better with decreased cough and congesiton . No further hemopytsis. She is trying to cut back on smoking.   Hosp F/U visit 08/30/11 -- 60 yo smoker with probable COPD, admitted 07/29/11 with hemoptysis and AE-COPD vs PNA. At CT scan showed a LUL 1.1 cm spiculated nodule. She was rx w abx and for AE COPD. No longer having any hemoptysis. CXR 11/29 showed improvement in RLL and RML PNA. Returns now for f/u.  She is down to 3 -4 cig a day. She has some exertional SOB, but not severe. She uses SABA 2x a week.     Objective:   Physical Exam GEN: A/Ox3; pleasant , NAD, well nourished   HEENT:  [place]/AT,  EACs-clear, TMs-wnl, NOSE-clear, THROAT-clear, no lesions, no postnasal drip or exudate noted.   NECK:  Supple w/ fair ROM; no JVD; normal carotid impulses w/o bruits; no thyromegaly or nodules palpated; no lymphadenopathy.  RESP  Clear  P & A; w/o, wheezes/ rales/ or rhonchi.no accessory muscle use, no dullness to percussion  CARD:  RRR, no m/r/g  , no peripheral edema, pulses intact, no cyanosis or  clubbing.  GI:   Soft & nt; nml bowel sounds; no organomegaly or masses detected.  Musco: Warm bil, no deformities or joint swelling noted.   Neuro: alert, no focal deficits noted.    Skin: Warm, no lesions or rashes     Assessment & Plan:  Lung nodule Will repeat Ct scan now, get superD cuts. Will move to bx vs refer for primary resection by Dr Edwyna Shell  COPD (chronic obstructive pulmonary disease) SABA prn for now Full PFT ROV next available after Ct

## 2011-08-30 NOTE — Assessment & Plan Note (Signed)
Will repeat Ct scan now, get superD cuts. Will move to bx vs refer for primary resection by Dr Edwyna Shell

## 2011-08-30 NOTE — Patient Instructions (Signed)
We will perform a repeat CT scan of your chest  Continue to use your albuterol inhaler 2 puffs if needed for shortness of breath We will perform full pulmonary function testing at your next office visit Follow up next available opening after th CT scan is completed

## 2011-08-30 NOTE — Progress Notes (Signed)
Addended by: Michel Bickers A on: 08/30/2011 03:48 PM   Modules accepted: Orders

## 2011-09-02 ENCOUNTER — Ambulatory Visit (INDEPENDENT_AMBULATORY_CARE_PROVIDER_SITE_OTHER)
Admission: RE | Admit: 2011-09-02 | Discharge: 2011-09-02 | Disposition: A | Discharge status: Home / Self Care | Payer: BC Managed Care – PPO | Source: Ambulatory Visit | Attending: Emergency Medicine | Admitting: Emergency Medicine

## 2011-09-02 DIAGNOSIS — J984 Other disorders of lung: Secondary | ICD-10-CM

## 2011-09-02 DIAGNOSIS — R911 Solitary pulmonary nodule: Secondary | ICD-10-CM

## 2011-09-06 ENCOUNTER — Telehealth: Payer: Self-pay | Admitting: Emergency Medicine

## 2011-09-06 NOTE — Telephone Encounter (Signed)
Per Dr. Delton Coombes chest CT showed pulmonary nodules are stable and he will discuss results in detail at her follow-up on 09/19/11. Pt is aware and PFT is 09/19/11 @ 12 noon and OV at 2pm.

## 2011-09-11 LAB — AFB CULTURE WITH SMEAR (NOT AT ARMC)
Acid Fast Smear: NONE SEEN
Special Requests: NORMAL

## 2011-09-19 ENCOUNTER — Ambulatory Visit (INDEPENDENT_AMBULATORY_CARE_PROVIDER_SITE_OTHER): Payer: BC Managed Care – PPO | Admitting: Emergency Medicine

## 2011-09-19 ENCOUNTER — Encounter: Payer: Self-pay | Admitting: Emergency Medicine

## 2011-09-19 DIAGNOSIS — J449 Chronic obstructive pulmonary disease, unspecified: Secondary | ICD-10-CM

## 2011-09-19 DIAGNOSIS — R911 Solitary pulmonary nodule: Secondary | ICD-10-CM

## 2011-09-19 DIAGNOSIS — J984 Other disorders of lung: Secondary | ICD-10-CM

## 2011-09-19 LAB — PULMONARY FUNCTION TEST

## 2011-09-19 NOTE — Progress Notes (Signed)
PFT done today. 

## 2011-09-19 NOTE — Assessment & Plan Note (Signed)
Will schedule either ENB or surgical resection depending on how easily we can navigate to the lesion - will check this and then call her to schedule

## 2011-09-19 NOTE — Patient Instructions (Signed)
We will start Spiriva 1 inhalation daily Use your Ventolin 2 puffs as needed for shortness of breath We will schedule a repeat bronchoscopy in the OR to biopsy your pulmonary nodule. We will contact you to schedule.  Follow with Dr Delton Coombes in 1 month

## 2011-09-19 NOTE — Assessment & Plan Note (Signed)
-   start spiriva - ventolin prn

## 2011-09-19 NOTE — Progress Notes (Signed)
Subjective:    Patient ID: Valerie Mosley, female    DOB: Dec 20, 1950, 61 y.o.   MRN: [id number]  HPI 61 y.o. WF , active smoker,  seen for initial pulmonary consult during hospital stay for hemoptysis 07/29/11 >tx for presumed CAP   08/13/2011 Post Hospital  Admitted 11/12-11/19/12 for hemoptysis with underling presumed COPD and supsected CAP. CT chest showed 1. 1.1 cm left upper lobe mass, Patchy density in the right upper lobe and minimal patchy density in the right lower lobe  She underwent  FOB from 11/13, all blood was on the R (small apical nodule is on the left). No endobronchial lesion noted but RML and RLL were occluded w clot - path, bx's confirm no malignancy. Tx for  pneumonia (no organism specified) with clinical improvement with abx, discharged on Avelox .   She has a known hx of  left upper lobe nodule with prev. FOB  that was neg for per pt.    Since discharge she is feeling much better with decreased cough and congesiton . No further hemopytsis. She is trying to cut back on smoking.   Hosp F/U visit 08/30/11 -- 61 yo smoker with probable COPD, admitted 07/29/11 with hemoptysis and AE-COPD vs PNA. At CT scan showed a LUL 1.1 cm spiculated nodule. She was rx w abx and for AE COPD. No longer having any hemoptysis. CXR 11/29 showed improvement in RLL and RML PNA. Returns now for f/u.  She is down to 3 -4 cig a day. She has some exertional SOB, but not severe. She uses SABA 2x a week.   ROV 09/19/11 -- COPD/tobacco use, recent hosp for hemoptysis as above, LUL nodule on CT scan. Returns today after PFT, had repeat CT on 12/17 that shows stable spiculated LUL nodule (? Smaller). Cigarettes at 4 cig a day. Rare SABA use.   PULMONARY FUNCTON TEST 09/19/2011  Peak Flow 130  FVC 2.87  FEV1 1.94  FEV1/FVC 67.6  FVC  % Predicted 88  FEV % Predicted 81  FeF 25-75 1.04  FeF 25-75 % Predicted 2.65    Objective:   Physical Exam GEN: A/Ox3; pleasant , NAD, well nourished   HEENT:  [place]/AT,   EACs-clear, TMs-wnl, NOSE-clear, THROAT-clear, no lesions, no postnasal drip or exudate noted.   NECK:  Supple w/ fair ROM; no JVD; normal carotid impulses w/o bruits; no thyromegaly or nodules palpated; no lymphadenopathy.  RESP  Clear  P & A; w/o, wheezes/ rales/ or rhonchi.no accessory muscle use, no dullness to percussion  CARD:  RRR, no m/r/g  , no peripheral edema, pulses intact, no cyanosis or clubbing.  GI:   Soft & nt; nml bowel sounds; no organomegaly or masses detected.  Musco: Warm bil, no deformities or joint swelling noted.   Neuro: alert, no focal deficits noted.    Skin: Warm, no lesions or rashes   CT scan 09/02/11:  IMPRESSION:  1. Interval clearing of right upper lobe pneumonia.  2. Left upper lobe nodule is unchanged in the short interval from  07/29/2011. Follow-up could be performed in 3 months to ensure  continued stability. This recommendation follows the consensus  statement: Guidelines for Management of Small Pulmonary Nodules  Detected on CT Scans: A Statement from the Fleischner Society as  published in Radiology 2005; 237:395-400. Available online at:  DietDisorder.cz.  3. Additional scattered pulmonary nodules can be reevaluated on  future imaging as well.      Assessment & Plan:   Lung nodule  Will schedule either ENB or surgical resection depending on how easily we can navigate to the lesion - will check this and then call her to schedule  COPD (chronic obstructive pulmonary disease) - start spiriva - ventolin prn

## 2011-10-29 ENCOUNTER — Ambulatory Visit (INDEPENDENT_AMBULATORY_CARE_PROVIDER_SITE_OTHER): Payer: BC Managed Care – PPO | Admitting: Emergency Medicine

## 2011-10-29 ENCOUNTER — Other Ambulatory Visit: Payer: Self-pay | Admitting: *Deleted

## 2011-10-29 ENCOUNTER — Encounter: Payer: Self-pay | Admitting: Emergency Medicine

## 2011-10-29 DIAGNOSIS — J449 Chronic obstructive pulmonary disease, unspecified: Secondary | ICD-10-CM

## 2011-10-29 DIAGNOSIS — R911 Solitary pulmonary nodule: Secondary | ICD-10-CM

## 2011-10-29 MED ORDER — TIOTROPIUM BROMIDE MONOHYDRATE 18 MCG IN CAPS: 18.0000 ug | ORAL_CAPSULE | Freq: Every day | RESPIRATORY_TRACT | Status: DC

## 2011-10-29 NOTE — Assessment & Plan Note (Signed)
She has 1.1cm nodule LUL, appears to be isolated disease. Discussed the pros and cons of following w serial CTs vs getting PET and considering referral to [facility] for possible primary resection. I don't think it would be easily reached by ENB.  - she wants to get PET and then discuss w [facility] posible resection. I will order today and then discuss w them at thoracic meeting

## 2011-10-29 NOTE — Assessment & Plan Note (Signed)
Started Spiriva last time. She seems to be better.

## 2011-10-29 NOTE — Patient Instructions (Signed)
Continue your Spiriva We will perform a PET scan and then consider referral to Thoracic Surgery based on the results.  Follow with Dr Delton Coombes in 2 weeks.

## 2011-10-29 NOTE — Progress Notes (Signed)
Subjective:    Patient ID: Valerie Mosley, female    DOB: 1950-12-10, 61 y.o.   MRN: [id number]  HPI 61 yo WF , active smoker,  seen for initial pulmonary consult during hospital stay for hemoptysis 07/29/11 >tx for presumed CAP   08/13/2011 Post Hospital  Admitted 11/12-11/19/12 for hemoptysis with underling presumed COPD and supsected CAP. CT chest showed 1. 1.1 cm left upper lobe mass, Patchy density in the right upper lobe and minimal patchy density in the right lower lobe  She underwent  FOB from 11/13, all blood was on the R (small apical nodule is on the left). No endobronchial lesion noted but RML and RLL were occluded w clot - path, bx's confirm no malignancy. Tx for  pneumonia (no organism specified) with clinical improvement with abx, discharged on Avelox .   She has a known hx of  left upper lobe nodule with prev. FOB  that was neg for per pt.    Since discharge she is feeling much better with decreased cough and congesiton . No further hemopytsis. She is trying to cut back on smoking.   Hosp F/U visit 08/30/11 -- 61 yo smoker with probable COPD, admitted 07/29/11 with hemoptysis and AE-COPD vs PNA. At CT scan showed a LUL 1.1 cm spiculated nodule. She was rx w abx and for AE COPD. No longer having any hemoptysis. CXR 11/29 showed improvement in RLL and RML PNA. Returns now for f/u.  She is down to 3 -4 cig a day. She has some exertional SOB, but not severe. She uses SABA 2x a week.   ROV 09/19/11 -- COPD/tobacco use, recent hosp for hemoptysis as above, LUL nodule on CT scan. Returns today after PFT, had repeat CT on 12/17 that shows stable spiculated LUL nodule (? Smaller). Cigarettes at 4 cig a day. Rare SABA use.   ROV 10/29/11 -- COPD, continued tobacco, LUL nodule (last CT 09/02/11). Breathing better since Spiriva started last time. Still smoking. Need to discuss CT and options for f/u - serial scans vs possible primary resection.   PULMONARY FUNCTON TEST 09/19/2011  Peak Flow 130   FVC 2.87  FEV1 1.94  FEV1/FVC 67.6  FVC  % Predicted 88  FEV % Predicted 81  FeF 25-75 1.04  FeF 25-75 % Predicted 2.65    Objective:   Physical Exam GEN: A/Ox3; pleasant , NAD, well nourished   HEENT:  [place]/AT,  EACs-clear, TMs-wnl, NOSE-clear, THROAT-clear, no lesions, no postnasal drip or exudate noted.   NECK:  Supple w/ fair ROM; no JVD; normal carotid impulses w/o bruits; no thyromegaly or nodules palpated; no lymphadenopathy.  RESP  Clear  P & A; w/o, wheezes/ rales/ or rhonchi.no accessory muscle use, no dullness to percussion  CARD:  RRR, no m/r/g  , no peripheral edema, pulses intact, no cyanosis or clubbing.  GI:   Soft & nt; nml bowel sounds; no organomegaly or masses detected.  Musco: Warm bil, no deformities or joint swelling noted.   Neuro: alert, no focal deficits noted.    Skin: Warm, no lesions or rashes   CT scan 09/02/11:  IMPRESSION:  1. Interval clearing of right upper lobe pneumonia.  2. Left upper lobe nodule is unchanged in the short interval from  07/29/2011. Follow-up could be performed in 3 months to ensure  continued stability. This recommendation follows the consensus  statement: Guidelines for Management of Small Pulmonary Nodules  Detected on CT Scans: A Statement from the Fleischner Society as  published in Radiology 2005; 237:395-400. Available online at:  DietDisorder.cz.  3. Additional scattered pulmonary nodules can be reevaluated on  future imaging as well.      Assessment & Plan:   COPD (chronic obstructive pulmonary disease) Started Spiriva last time. She seems to be better.   Lung nodule She has 1.1cm nodule LUL, appears to be isolated disease. Discussed the pros and cons of following w serial CTs vs getting PET and considering referral to [facility] for possible primary resection. I don't think it would be easily reached by ENB.  - she wants to get PET and then discuss w [facility] posible  resection. I will order today and then discuss w them at thoracic meeting

## 2011-11-06 ENCOUNTER — Encounter (HOSPITAL_COMMUNITY)
Admission: RE | Admit: 2011-11-06 | Discharge: 2011-11-06 | Disposition: A | Discharge status: Home / Self Care | Payer: BC Managed Care – PPO | Source: Ambulatory Visit | Attending: Emergency Medicine | Admitting: Emergency Medicine

## 2011-11-06 DIAGNOSIS — N269 Renal sclerosis, unspecified: Secondary | ICD-10-CM | POA: Insufficient documentation

## 2011-11-06 DIAGNOSIS — K7689 Other specified diseases of liver: Secondary | ICD-10-CM | POA: Insufficient documentation

## 2011-11-06 DIAGNOSIS — R911 Solitary pulmonary nodule: Secondary | ICD-10-CM | POA: Insufficient documentation

## 2011-11-06 DIAGNOSIS — N200 Calculus of kidney: Secondary | ICD-10-CM | POA: Insufficient documentation

## 2011-11-06 LAB — GLUCOSE, CAPILLARY: Glucose-Capillary: 100 mg/dL — ABNORMAL HIGH (ref 70–99)

## 2011-11-06 MED ORDER — FLUDEOXYGLUCOSE F - 18 (FDG) INJECTION
18.6000 | Freq: Once | INTRAVENOUS | Status: AC | PRN
  Administered 2011-11-06: 18.6 via INTRAVENOUS

## 2011-11-11 ENCOUNTER — Telehealth: Payer: Self-pay | Admitting: Emergency Medicine

## 2011-11-11 NOTE — Telephone Encounter (Signed)
Please advise on PET scan results. Carron Curie, CMA

## 2011-11-11 NOTE — Telephone Encounter (Signed)
Pt can be reached at [phone].  Pt would at least like to know if RB has rec'd the results.  And, of course, if we have she would like those results.  Please advise.  Antionette Fairy

## 2011-11-12 NOTE — Telephone Encounter (Signed)
Reviewed results with the patient. Please get her on the list for her films and case to be reviewed at the Thoracic conference on next Thursday 11/21/11. Thanks.

## 2011-11-13 NOTE — Telephone Encounter (Signed)
Will put pt on thoracic conference 11/21/11

## 2011-11-15 ENCOUNTER — Telehealth: Payer: Self-pay | Admitting: Emergency Medicine

## 2011-11-15 DIAGNOSIS — R222 Localized swelling, mass and lump, trunk: Secondary | ICD-10-CM

## 2011-11-15 NOTE — Telephone Encounter (Signed)
Spoke with pt. She cancelled her ov with RB for Monday 3/4 b/c she states would like to see him after he meets with the thoracic conference on 3/7. RB's first opening after this is not until 4/7 and she does not want to wait this long. Please advise if okay to overbook you for sooner appt. Thanks!

## 2011-11-18 ENCOUNTER — Ambulatory Visit: Payer: BC Managed Care – PPO | Admitting: Emergency Medicine

## 2011-11-20 NOTE — Telephone Encounter (Signed)
Yes thank you - ok to overbook

## 2011-11-20 NOTE — Telephone Encounter (Signed)
Pt is scheduled to follow-up with RB on Tues., 11/26/11 @ 1:30pm. Will forward to RB so he is aware and as reminder to discuss pt at thoracic meeting.

## 2011-11-21 NOTE — Telephone Encounter (Signed)
Called the patient and [facility] regarding thoracic conference. Will attempt to call her again 3/8

## 2011-11-22 NOTE — Telephone Encounter (Signed)
Addended by: Leslye Peer on: 11/22/2011 01:04 PM   Modules accepted: Orders

## 2011-11-22 NOTE — Telephone Encounter (Signed)
Discussed options w patient - Thoracic group has recommended wedge resection. She understands and agrees to go to meet surgeons, I will refer to [facility]. She can cancel her appointment here for next Tuesday.

## 2011-11-26 ENCOUNTER — Ambulatory Visit: Payer: BC Managed Care – PPO | Admitting: Emergency Medicine

## 2011-11-28 ENCOUNTER — Institutional Professional Consult (permissible substitution) (INDEPENDENT_AMBULATORY_CARE_PROVIDER_SITE_OTHER): Payer: BC Managed Care – PPO | Admitting: Thoracic Surgery (Cardiothoracic Vascular Surgery)

## 2011-11-28 ENCOUNTER — Encounter: Payer: Self-pay | Admitting: Thoracic Surgery (Cardiothoracic Vascular Surgery)

## 2011-11-28 VITALS — BP 104/76 | HR 100 | Resp 20 | Ht 65.5 in | Wt 132.0 lb

## 2011-11-28 DIAGNOSIS — R911 Solitary pulmonary nodule: Secondary | ICD-10-CM

## 2011-11-28 NOTE — Progress Notes (Signed)
PCP is Allean Found, MD, MD Referring Provider is Leslye Peer, MD  Chief Complaint  Patient presents with  . Lung Mass    Referral from Dr Delton Coombes for Lung  Mass, PET Scan 11/06/11    HPI: Valerie Mosley is a 61 year old woman sent for consultation by Dr. Delton Coombes regarding a left upper lobe nodule. Her chief complaint is a lung nodule. She is a smoker, with an 80 pack year history. She was in her usual state of good health until November of 2012 when she developed hemoptysis. She was diagnosed with pneumonia and was hospitalized and in the ICU for several days. CT scan at that time showed a right upper lobe pneumonia, but also a left upper lobe nodule. She had a repeat CT in December which showed clearing of the right lung process but persistence of the lung nodule. A PET CT was done in February which showed possible slight interval enlargement of the nodule, there was mild hypermetabolic activity noted in the vicinity of the nodule.   Past Medical History  Diagnosis Date  . Hypertension   . Kyphoscoliosis   . COPD (chronic obstructive pulmonary disease)   . Cough 07/29/11    started coughing up blood this am    Past Surgical History  Procedure Date  . Urethra surgery   . Thyroid surgery 1980    1/2 thyroid removed - benign  . Bronchoscopy     No family history on file.negative for CAD, lung cancer  Social History History  Substance Use Topics  . Smoking status: Current Everyday Smoker -- 0.5 packs/day for 40 years    Types: Cigarettes  . Smokeless tobacco: Not on file   Comment: per pt, smoking 4 cigarettes daily  . Alcohol Use: Yes     1-2 beers daily    Current Outpatient Prescriptions  Medication Sig Dispense Refill  . acetaminophen (TYLENOL) 500 MG tablet Take 500 mg by mouth every 6 (six) hours as needed.      Marland Kitchen albuterol (VENTOLIN HFA) 108 (90 BASE) MCG/ACT inhaler Inhale 2 puffs into the lungs every 6 (six) hours as needed.        Marland Kitchen amLODipine (NORVASC) 10  MG tablet Take 10 mg by mouth daily.        Marland Kitchen HYDROcodone-acetaminophen (VICODIN) 5-500 MG per tablet Take 1 tablet by mouth every 6 (six) hours as needed. PRN/ Dental      . loratadine (CLARITIN) 10 MG tablet Take 10 mg by mouth daily as needed.        . penicillin v potassium (VEETID) 500 MG tablet Take as directed when needed for dental infection      . tiotropium (SPIRIVA HANDIHALER) 18 MCG inhalation capsule Place 1 capsule (18 mcg total) into inhaler and inhale daily.  30 capsule  11    No Known Allergies  Review of Systems  Constitutional: Negative for fever, appetite change, fatigue and unexpected weight change.  Eyes: Negative.   Respiratory: Positive for cough (hemoptysis in november with pneumonia) and wheezing.        Pneumonia 11/12  Genitourinary: Negative.   Musculoskeletal: Positive for back pain and joint swelling.  Neurological: Negative.   Hematological: Negative.  Does not bruise/bleed easily.  All other systems reviewed and are negative.    BP 104/76  Pulse 100  Resp 20  Ht 5' 5.5" (1.664 m)  Wt 132 lb (59.875 kg)  BMI 21.63 kg/m2  SpO2 97% Physical Exam  Constitutional: She is oriented  to person, place, and time. She appears well-developed and well-nourished. No distress.  HENT:  Head: Normocephalic and atraumatic.  Eyes: EOM are normal.  Neck: Neck supple. No tracheal deviation present. No thyromegaly present.  Cardiovascular: Normal rate, regular rhythm, normal heart sounds and intact distal pulses.   Pulmonary/Chest: Effort normal. No respiratory distress. She has no wheezes. She has no rales.  Abdominal: Soft. There is no tenderness.  Musculoskeletal: She exhibits no edema.  Lymphadenopathy:    She has no cervical adenopathy.  Neurological: She is alert and oriented to person, place, and time.  Skin: Skin is warm and dry.     Diagnostic Tests: CT is from November and December 2012 reviewed, findings as previously noted PET/CT from 11/06/2011  reviewed findings as previously noted  Impression: 61 year old woman with a history of tobacco abuse who presents with a left upper lobe nodule which is mildly the metabolic by PET, this lesion is highly suspicious for primary bronchogenic carcinoma. She did have bronchoscopic biopsy which was unrevealing however this is relatively small and peripheral nodule very susceptible to a false-negative find by bronchoscopic biopsy.  I recommended to Valerie Mosley that she have a left VATS, wedge resection for definitive diagnostic purposes. I also recommended was that if frozen section examination this nodule revealed this to be a lung cancer that we would proceed with definitive treatment in the form of a left upper lobectomy and lymph node dissection. She had any questions regarding the need for biopsy, as well as basing of lobectomy on the frozen section results.  I discussed with her the general nature of the procedure, need for general anesthesia, incision is to be used, an intraoperative decision making. I discussed with her the indications, risks, benefits, and alternative approaches. In this case the alternative would be to continue to observe this with serial CT scans, the obvious downside to grower spread in the interim if it is in fact malignant. She understands the risk of surgery include but are not limited to death, MI, DVT, PE, infection, bleeding, possible need for transfusion, prolonged air leak.  She is leaning towards proceeding with a wedge resection and definitive lobectomy if necessary based on frozen section, however she wishes to discuss these issues with her husband before making a commitment.  Plan: Left VATS, wedge resection, possible lobectomy Will schedule as the patient had a chance to discuss with her husband and decide when she wants to proceed.

## 2011-12-03 ENCOUNTER — Other Ambulatory Visit: Payer: Self-pay

## 2011-12-03 DIAGNOSIS — D381 Neoplasm of uncertain behavior of trachea, bronchus and lung: Secondary | ICD-10-CM

## 2011-12-12 ENCOUNTER — Encounter (HOSPITAL_COMMUNITY): Payer: Self-pay | Admitting: Pharmacy Technician

## 2011-12-12 NOTE — Pre-Procedure Instructions (Signed)
[address] LIN H[address]MANN  12/12/2011   Your procedure is scheduled on:  Tuesday December 17, 2011  Report to [facility] at 830 am  Call this number if you have problems the morning of surgery: [phone]   Remember:   Do not eat food:After Midnight.  May have clear liquids: up to 4 Hours before arrival.   Clear liquids include soda, tea, black coffee, apple or grape juice, broth.  Take these medicines the morning of surgery with A SIP OF WATER: albuterol, amlodipine, vicodine, claritin   Do not wear jewelry, make-up or nail polish.  Do not wear lotions, powders, or perfumes. You may wear deodorant.  Do not shave 48 hours prior to surgery.  Do not bring valuables to the hospital.  Contacts, dentures or bridgework may not be worn into surgery.  Leave suitcase in the car. After surgery it may be brought to your room.  For patients admitted to the hospital, checkout time is 11:00 AM the day of discharge.   Patients discharged the day of surgery will not be allowed to drive home.  Name and phone number of your driver: family  Special Instructions: CHG Shower Use Special Wash: 1/2 bottle night before surgery and 1/2 bottle morning of surgery.   Please read over the following fact sheets that you were given: Pain Booklet, Coughing and Deep Breathing, Blood Transfusion Information, MRSA Information and Surgical Site Infection Prevention

## 2011-12-16 ENCOUNTER — Encounter (HOSPITAL_COMMUNITY)
Admission: RE | Admit: 2011-12-16 | Discharge: 2011-12-16 | Disposition: A | Discharge status: Home / Self Care | Payer: BC Managed Care – PPO | Source: Ambulatory Visit | Attending: Thoracic Surgery (Cardiothoracic Vascular Surgery) | Admitting: Thoracic Surgery (Cardiothoracic Vascular Surgery)

## 2011-12-16 ENCOUNTER — Other Ambulatory Visit: Payer: Self-pay

## 2011-12-16 ENCOUNTER — Encounter (HOSPITAL_COMMUNITY): Payer: Self-pay

## 2011-12-16 VITALS — BP 121/56 | HR 95 | Temp 97.0°F | Resp 20 | Ht 65.0 in | Wt 128.2 lb

## 2011-12-16 DIAGNOSIS — D381 Neoplasm of uncertain behavior of trachea, bronchus and lung: Secondary | ICD-10-CM

## 2011-12-16 HISTORY — DX: Acute upper respiratory infection, unspecified: J06.9

## 2011-12-16 HISTORY — DX: Other specified postprocedural states: R11.2

## 2011-12-16 HISTORY — DX: Malignant (primary) neoplasm, unspecified: C80.1

## 2011-12-16 HISTORY — DX: Other specified postprocedural states: Z98.890

## 2011-12-16 LAB — ABO/RH: ABO/RH(D): O POS

## 2011-12-16 LAB — URINE MICROSCOPIC-ADD ON

## 2011-12-16 LAB — PROTIME-INR
INR: 1.01 (ref 0.00–1.49)
Prothrombin Time: 13.5 seconds (ref 11.6–15.2)

## 2011-12-16 LAB — TYPE AND SCREEN
ABO/RH(D): O POS
Antibody Screen: NEGATIVE

## 2011-12-16 LAB — COMPREHENSIVE METABOLIC PANEL
ALT: 16 U/L (ref 0–35)
AST: 17 U/L (ref 0–37)
Albumin: 3.9 g/dL (ref 3.5–5.2)
Alkaline Phosphatase: 120 U/L — ABNORMAL HIGH (ref 39–117)
BUN: 11 mg/dL (ref 6–23)
CO2: 21 mEq/L (ref 19–32)
Calcium: 8.9 mg/dL (ref 8.4–10.5)
Chloride: 105 mEq/L (ref 96–112)
Creatinine, Ser: 0.56 mg/dL (ref 0.50–1.10)
GFR calc Af Amer: >90 mL/min (ref >90)
GFR calc non Af Amer: >90 mL/min (ref >90)
Glucose, Bld: 92 mg/dL (ref 70–99)
Potassium: 3.7 mEq/L (ref 3.5–5.1)
Sodium: 137 mEq/L (ref 135–145)
Total Bilirubin: 0.4 mg/dL (ref 0.3–1.2)
Total Protein: 7.1 g/dL (ref 6.0–8.3)

## 2011-12-16 LAB — URINALYSIS, ROUTINE W REFLEX MICROSCOPIC
Bilirubin Urine: NEGATIVE
Glucose, UA: NEGATIVE mg/dL
Ketones, ur: NEGATIVE mg/dL
Nitrite: NEGATIVE
Protein, ur: NEGATIVE mg/dL
Specific Gravity, Urine: 1.01 (ref 1.005–1.030)
Urobilinogen, UA: 0.2 mg/dL (ref 0.0–1.0)
pH: 6 (ref 5.0–8.0)

## 2011-12-16 LAB — SURGICAL PCR SCREEN
MRSA, PCR: NEGATIVE
Staphylococcus aureus: NEGATIVE

## 2011-12-16 LAB — BLOOD GAS, ARTERIAL
Acid-base deficit: 1.8 mmol/L (ref 0.0–2.0)
Bicarbonate: 21.9 mEq/L (ref 20.0–24.0)
Drawn by: 206361
FIO2: 0.21 %
O2 Saturation: 96.2 %
Patient temperature: 98.6
TCO2: 22.9 mmol/L (ref 0–100)
pCO2 arterial: 33.6 mmHg — ABNORMAL LOW (ref 35.0–45.0)
pH, Arterial: 7.429 — ABNORMAL HIGH (ref 7.350–7.400)
pO2, Arterial: 76 mmHg — ABNORMAL LOW (ref 80.0–100.0)

## 2011-12-16 LAB — CBC
HCT: 44.4 % (ref 36.0–46.0)
Hemoglobin: 15.8 g/dL — ABNORMAL HIGH (ref 12.0–15.0)
MCH: 31.4 pg (ref 26.0–34.0)
MCHC: 35.6 g/dL (ref 30.0–36.0)
MCV: 88.3 fL (ref 78.0–100.0)
Platelets: 285 10*3/uL (ref 150–400)
RBC: 5.03 MIL/uL (ref 3.87–5.11)
RDW: 13.5 % (ref 11.5–15.5)
WBC: 8.6 10*3/uL (ref 4.0–10.5)

## 2011-12-16 LAB — APTT: aPTT: 38 seconds — ABNORMAL HIGH (ref 24–37)

## 2011-12-16 MED ORDER — DEXTROSE 5 % IV SOLN
1.5000 g | INTRAVENOUS | Status: AC
  Administered 2011-12-17: 1.5 g via INTRAVENOUS
  Filled 2011-12-16: qty 1.5

## 2011-12-16 NOTE — Consult Note (Signed)
Anesthesia:  Patient is a 61 year old female scheduled for a left VATS, LUL wedge resection versus lobectomy on 12/17/11.  Her PAT appointment was on 4/1/3.  History includes smoking, post-operative N/V, COPD, RUL PNA 08/2011, HTN, kyphoscoliosis, previous partial thyroidectomy for "benign" lesion.  PCP is Dr. Merri Brunette.  Labs noted.  CXR on 12/16/11 showed: Mild hyperinflation again noted. Poorly visualized nodule in the left upper lobe measures about 7 mm. No acute infiltrate or pulmonary edema. Mild degenerative changes mid thoracic spine.  Her Pulmonologist is Dr. Delton Coombes.  PULMONARY FUNCTON TEST on 09/19/2011 showed:  Peak Flow 130  FVC 2.87  FEV1 1.94  FEV1/FVC 67.6  FVC % Predicted 88  FEV % Predicted 81  FeF 25-75 1.04  FeF 25-75 % Predicted 2.65   EKG today showed NSR, possible LAE, incomplete right BBB, septal infarct (age undetermined). 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  Overall, her EKG was not felt significantly changed, however, by the interpreting Cardiologist.  (She is not followed by a Cardiologist.)  No chest pain symptoms were reported at her PAT visit.  Echo on 07/30/11 showed: - Left ventricle: The cavity size was normal. There was mild concentric hypertrophy. Systolic function was normal. The estimated ejection fraction was in the range of 60% to 65%. Although no diagnostic regional wall motion abnormality was identified, this possibility cannot be completely excluded on the basis of this study. Doppler parameters are consistent with abnormal left ventricular relaxation (grade 1 diastolic dysfunction). - Right atrium: The atrium was mildly dilated.  Anticipate she can proceed if no acute changes in her status.

## 2011-12-17 ENCOUNTER — Encounter (HOSPITAL_COMMUNITY): Payer: Self-pay | Admitting: *Deleted

## 2011-12-17 ENCOUNTER — Encounter (HOSPITAL_COMMUNITY): Payer: Self-pay | Admitting: Vascular Surgery

## 2011-12-17 ENCOUNTER — Ambulatory Visit (HOSPITAL_COMMUNITY): Payer: BC Managed Care – PPO

## 2011-12-17 ENCOUNTER — Ambulatory Visit (HOSPITAL_COMMUNITY): Payer: BC Managed Care – PPO | Admitting: Vascular Surgery

## 2011-12-17 ENCOUNTER — Inpatient Hospital Stay (HOSPITAL_COMMUNITY)
Admission: RE | Admit: 2011-12-17 | Discharge: 2011-12-25 | DRG: 538 | Disposition: A | Discharge status: Home / Self Care | Payer: BC Managed Care – PPO | Source: Ambulatory Visit | Attending: Thoracic Surgery (Cardiothoracic Vascular Surgery) | Admitting: Thoracic Surgery (Cardiothoracic Vascular Surgery)

## 2011-12-17 ENCOUNTER — Encounter (HOSPITAL_COMMUNITY)
Admission: RE | Disposition: A | Discharge status: Home / Self Care | Payer: Self-pay | Source: Ambulatory Visit | Attending: Thoracic Surgery (Cardiothoracic Vascular Surgery)

## 2011-12-17 DIAGNOSIS — F17200 Nicotine dependence, unspecified, uncomplicated: Secondary | ICD-10-CM | POA: Diagnosis present

## 2011-12-17 DIAGNOSIS — M4120 Other idiopathic scoliosis, site unspecified: Secondary | ICD-10-CM | POA: Diagnosis present

## 2011-12-17 DIAGNOSIS — Z01812 Encounter for preprocedural laboratory examination: Secondary | ICD-10-CM

## 2011-12-17 DIAGNOSIS — Z01818 Encounter for other preprocedural examination: Secondary | ICD-10-CM

## 2011-12-17 DIAGNOSIS — J9383 Other pneumothorax: Secondary | ICD-10-CM | POA: Diagnosis present

## 2011-12-17 DIAGNOSIS — E876 Hypokalemia: Secondary | ICD-10-CM | POA: Diagnosis not present

## 2011-12-17 DIAGNOSIS — D381 Neoplasm of uncertain behavior of trachea, bronchus and lung: Secondary | ICD-10-CM

## 2011-12-17 DIAGNOSIS — C3410 Malignant neoplasm of upper lobe, unspecified bronchus or lung: Principal | ICD-10-CM | POA: Diagnosis present

## 2011-12-17 DIAGNOSIS — J449 Chronic obstructive pulmonary disease, unspecified: Secondary | ICD-10-CM | POA: Diagnosis present

## 2011-12-17 DIAGNOSIS — I10 Essential (primary) hypertension: Secondary | ICD-10-CM | POA: Diagnosis present

## 2011-12-17 DIAGNOSIS — J4489 Other specified chronic obstructive pulmonary disease: Secondary | ICD-10-CM | POA: Diagnosis present

## 2011-12-17 HISTORY — PX: LOBECTOMY: SHX5089

## 2011-12-17 LAB — BASIC METABOLIC PANEL
BUN: 8 mg/dL (ref 6–23)
BUN: 11 mg/dL (ref 6–23)
CO2: 15 mEq/L — ABNORMAL LOW (ref 19–32)
CO2: 21 mEq/L (ref 19–32)
Calcium: 4.9 mg/dL — CL (ref 8.4–10.5)
Calcium: 7.9 mg/dL — ABNORMAL LOW (ref 8.4–10.5)
Chloride: 120 mEq/L — ABNORMAL HIGH (ref 96–112)
Chloride: 106 mEq/L (ref 96–112)
Creatinine, Ser: 0.35 mg/dL — ABNORMAL LOW (ref 0.50–1.10)
Creatinine, Ser: 0.53 mg/dL (ref 0.50–1.10)
GFR calc Af Amer: >90 mL/min (ref >90)
GFR calc Af Amer: >90 mL/min (ref >90)
GFR calc non Af Amer: >90 mL/min (ref >90)
GFR calc non Af Amer: >90 mL/min (ref >90)
Glucose, Bld: 99 mg/dL (ref 70–99)
Glucose, Bld: 118 mg/dL — ABNORMAL HIGH (ref 70–99)
Potassium: 2.2 mEq/L — CL (ref 3.5–5.1)
Potassium: 3.8 mEq/L (ref 3.5–5.1)
Sodium: 144 mEq/L (ref 135–145)
Sodium: 139 mEq/L (ref 135–145)

## 2011-12-17 LAB — CBC
HCT: 25.6 % — ABNORMAL LOW (ref 36.0–46.0)
Hemoglobin: 8.6 g/dL — ABNORMAL LOW (ref 12.0–15.0)
MCH: 30.9 pg (ref 26.0–34.0)
MCHC: 33.6 g/dL (ref 30.0–36.0)
MCV: 92.1 fL (ref 78.0–100.0)
Platelets: 166 10*3/uL (ref 150–400)
RBC: 2.78 MIL/uL — ABNORMAL LOW (ref 3.87–5.11)
RDW: 13.7 % (ref 11.5–15.5)
WBC: 12.4 10*3/uL — ABNORMAL HIGH (ref 4.0–10.5)

## 2011-12-17 SURGERY — VIDEO ASSISTED THORACOSCOPY (VATS)/WEDGE RESECTION
Anesthesia: General | Site: Chest | Laterality: Left | Wound class: Clean Contaminated

## 2011-12-17 MED ORDER — TIOTROPIUM BROMIDE MONOHYDRATE 18 MCG IN CAPS
18.0000 ug | ORAL_CAPSULE | Freq: Every day | RESPIRATORY_TRACT | Status: DC
  Administered 2011-12-18 – 2011-12-25 (×8): 18 ug via RESPIRATORY_TRACT
  Filled 2011-12-17 (×2): qty 5

## 2011-12-17 MED ORDER — AMLODIPINE BESYLATE 10 MG PO TABS
10.0000 mg | ORAL_TABLET | Freq: Every day | ORAL | Status: DC
  Administered 2011-12-22 – 2011-12-24 (×3): 10 mg via ORAL
  Filled 2011-12-17 (×8): qty 1

## 2011-12-17 MED ORDER — ALBUMIN HUMAN 5 % IV SOLN
12.5000 g | Freq: Once | INTRAVENOUS | Status: AC
  Administered 2011-12-17: 12.5 g via INTRAVENOUS

## 2011-12-17 MED ORDER — PHENYLEPHRINE HCL 10 MG/ML IJ SOLN
30.0000 ug/min | INTRAVENOUS | Status: DC
  Administered 2011-12-17: 13.333 ug/min via INTRAVENOUS
  Filled 2011-12-17: qty 1

## 2011-12-17 MED ORDER — BUPIVACAINE 0.5 % ON-Q PUMP SINGLE CATH 400 ML
400.0000 mL | INJECTION | Status: DC
  Filled 2011-12-17: qty 400

## 2011-12-17 MED ORDER — BUPIVACAINE ON-Q PAIN PUMP (FOR ORDER SET NO CHG): INJECTION | Status: DC

## 2011-12-17 MED ORDER — ALBUTEROL SULFATE HFA 108 (90 BASE) MCG/ACT IN AERS
2.0000 | INHALATION_SPRAY | Freq: Four times a day (QID) | RESPIRATORY_TRACT | Status: DC | PRN
  Administered 2011-12-22: 2 via RESPIRATORY_TRACT
  Filled 2011-12-17: qty 6.7

## 2011-12-17 MED ORDER — TRAMADOL HCL 50 MG PO TABS
50.0000 mg | ORAL_TABLET | Freq: Four times a day (QID) | ORAL | Status: DC | PRN
  Administered 2011-12-24 – 2011-12-25 (×2): 50 mg via ORAL
  Filled 2011-12-17 (×2): qty 1

## 2011-12-17 MED ORDER — ONDANSETRON HCL 4 MG/2ML IJ SOLN: 4.0000 mg | Freq: Four times a day (QID) | INTRAMUSCULAR | Status: DC | PRN

## 2011-12-17 MED ORDER — OXYCODONE-ACETAMINOPHEN 5-325 MG PO TABS
1.0000 | ORAL_TABLET | ORAL | Status: DC | PRN
  Administered 2011-12-19 (×2): 1 via ORAL
  Administered 2011-12-19: 2 via ORAL
  Administered 2011-12-20 (×2): 1 via ORAL
  Administered 2011-12-20: 2 via ORAL
  Administered 2011-12-20: 1 via ORAL
  Administered 2011-12-21: 2 via ORAL
  Administered 2011-12-21: 1 via ORAL
  Administered 2011-12-21: 2 via ORAL
  Administered 2011-12-21: 1 via ORAL
  Administered 2011-12-21 – 2011-12-22 (×2): 2 via ORAL
  Administered 2011-12-22: 1 via ORAL
  Administered 2011-12-22 – 2011-12-23 (×2): 2 via ORAL
  Administered 2011-12-23 (×3): 1 via ORAL
  Administered 2011-12-24: 2 via ORAL
  Administered 2011-12-24 – 2011-12-25 (×5): 1 via ORAL
  Filled 2011-12-17: qty 2
  Filled 2011-12-17 (×2): qty 1
  Filled 2011-12-17: qty 2
  Filled 2011-12-17 (×2): qty 1
  Filled 2011-12-17: qty 2
  Filled 2011-12-17 (×4): qty 1
  Filled 2011-12-17 (×4): qty 2
  Filled 2011-12-17: qty 1
  Filled 2011-12-17: qty 2
  Filled 2011-12-17 (×2): qty 1
  Filled 2011-12-17 (×2): qty 2
  Filled 2011-12-17 (×4): qty 1

## 2011-12-17 MED ORDER — FENTANYL 10 MCG/ML IV SOLN
INTRAVENOUS | Status: DC
  Administered 2011-12-17: 20:00:00 via INTRAVENOUS
  Administered 2011-12-18: 176 ug via INTRAVENOUS
  Administered 2011-12-18: 135 ug via INTRAVENOUS
  Administered 2011-12-18: via INTRAVENOUS
  Administered 2011-12-18: 120 ug via INTRAVENOUS
  Administered 2011-12-18: 10 ug/h via INTRAVENOUS
  Administered 2011-12-18: 150 ug via INTRAVENOUS
  Administered 2011-12-18: 165 ug via INTRAVENOUS
  Administered 2011-12-18: 75 ug via INTRAVENOUS
  Administered 2011-12-19: 210 ug via INTRAVENOUS
  Administered 2011-12-19: 60 ug via INTRAVENOUS
  Administered 2011-12-19: 330 ug via INTRAVENOUS
  Administered 2011-12-19: 180 ug via INTRAVENOUS
  Administered 2011-12-19: 195 ug via INTRAVENOUS
  Administered 2011-12-19: 11:00:00 via INTRAVENOUS
  Administered 2011-12-19: 150 ug via INTRAVENOUS
  Administered 2011-12-20: 60 ug via INTRAVENOUS
  Administered 2011-12-20 – 2011-12-21 (×2): 30 ug via INTRAVENOUS
  Administered 2011-12-21 (×2): 15 ug via INTRAVENOUS
  Filled 2011-12-17 (×7): qty 50

## 2011-12-17 MED ORDER — PROPOFOL 10 MG/ML IV EMUL
INTRAVENOUS | Status: DC | PRN
  Administered 2011-12-17: 90 mg via INTRAVENOUS

## 2011-12-17 MED ORDER — DIPHENHYDRAMINE HCL 50 MG/ML IJ SOLN: 12.5000 mg | Freq: Four times a day (QID) | INTRAMUSCULAR | Status: DC | PRN

## 2011-12-17 MED ORDER — OXYCODONE-ACETAMINOPHEN 5-325 MG PO TABS: 1.0000 | ORAL_TABLET | ORAL | Status: DC | PRN

## 2011-12-17 MED ORDER — LORATADINE 10 MG PO TABS: 10.0000 mg | ORAL_TABLET | Freq: Every day | ORAL | Status: DC | PRN

## 2011-12-17 MED ORDER — VECURONIUM BROMIDE 10 MG IV SOLR
INTRAVENOUS | Status: DC | PRN
  Administered 2011-12-17 (×2): 2 mg via INTRAVENOUS

## 2011-12-17 MED ORDER — OXYCODONE HCL 5 MG PO TABS: 5.0000 mg | ORAL_TABLET | ORAL | Status: AC | PRN

## 2011-12-17 MED ORDER — LACTATED RINGERS IV SOLN
INTRAVENOUS | Status: DC | PRN
  Administered 2011-12-17 (×2): via INTRAVENOUS

## 2011-12-17 MED ORDER — ROCURONIUM BROMIDE 100 MG/10ML IV SOLN
INTRAVENOUS | Status: DC | PRN
  Administered 2011-12-17: 20 mg via INTRAVENOUS
  Administered 2011-12-17: 30 mg via INTRAVENOUS

## 2011-12-17 MED ORDER — BUPIVACAINE 0.5 % ON-Q PUMP SINGLE CATH 400 ML
INJECTION | Status: DC | PRN
  Administered 2011-12-17: 400 mL

## 2011-12-17 MED ORDER — ACETAMINOPHEN 10 MG/ML IV SOLN
1000.0000 mg | Freq: Four times a day (QID) | INTRAVENOUS | Status: AC
  Administered 2011-12-17 – 2011-12-18 (×4): 1000 mg via INTRAVENOUS
  Filled 2011-12-17 (×6): qty 100

## 2011-12-17 MED ORDER — ONDANSETRON HCL 4 MG/2ML IJ SOLN: 4.0000 mg | Freq: Once | INTRAMUSCULAR | Status: DC | PRN

## 2011-12-17 MED ORDER — SODIUM CHLORIDE 0.9 % IV SOLN
0.4000 ug/kg/h | INTRAVENOUS | Status: DC
  Filled 2011-12-17: qty 2

## 2011-12-17 MED ORDER — POTASSIUM CHLORIDE 10 MEQ/50ML IV SOLN
10.0000 meq | Freq: Every day | INTRAVENOUS | Status: DC | PRN
  Administered 2011-12-19: 10 meq via INTRAVENOUS
  Filled 2011-12-17: qty 50

## 2011-12-17 MED ORDER — DEXTROSE-NACL 5-0.45 % IV SOLN
INTRAVENOUS | Status: DC
  Administered 2011-12-17: 20:00:00 via INTRAVENOUS
  Administered 2011-12-19: 1000 mL via INTRAVENOUS

## 2011-12-17 MED ORDER — BISACODYL 5 MG PO TBEC
10.0000 mg | DELAYED_RELEASE_TABLET | Freq: Every day | ORAL | Status: DC
  Administered 2011-12-18 – 2011-12-24 (×7): 10 mg via ORAL
  Filled 2011-12-17 (×7): qty 2

## 2011-12-17 MED ORDER — ONDANSETRON HCL 4 MG/2ML IJ SOLN
INTRAMUSCULAR | Status: DC | PRN
  Administered 2011-12-17: 4 mg via INTRAVENOUS

## 2011-12-17 MED ORDER — DEXTROSE 5 % IV SOLN
1.5000 g | Freq: Two times a day (BID) | INTRAVENOUS | Status: AC
  Administered 2011-12-18 (×2): 1.5 g via INTRAVENOUS
  Filled 2011-12-17 (×3): qty 1.5

## 2011-12-17 MED ORDER — SENNOSIDES-DOCUSATE SODIUM 8.6-50 MG PO TABS: 1.0000 | ORAL_TABLET | Freq: Every evening | ORAL | Status: DC | PRN

## 2011-12-17 MED ORDER — GLYCOPYRROLATE 0.2 MG/ML IJ SOLN
INTRAMUSCULAR | Status: DC | PRN
  Administered 2011-12-17: .6 mg via INTRAVENOUS

## 2011-12-17 MED ORDER — HEMOSTATIC AGENTS (NO CHARGE) OPTIME
TOPICAL | Status: DC | PRN
  Administered 2011-12-17: 1

## 2011-12-17 MED ORDER — HYDROMORPHONE HCL PF 1 MG/ML IJ SOLN
0.2500 mg | INTRAMUSCULAR | Status: DC | PRN
  Administered 2011-12-17: 0.5 mg via INTRAVENOUS

## 2011-12-17 MED ORDER — LIDOCAINE HCL (CARDIAC) 20 MG/ML IV SOLN
INTRAVENOUS | Status: DC | PRN
  Administered 2011-12-17: 40 mg via INTRAVENOUS

## 2011-12-17 MED ORDER — NALOXONE HCL 0.4 MG/ML IJ SOLN: 0.4000 mg | INTRAMUSCULAR | Status: DC | PRN

## 2011-12-17 MED ORDER — FENTANYL CITRATE 0.05 MG/ML IJ SOLN
INTRAMUSCULAR | Status: DC | PRN
  Administered 2011-12-17 (×2): 100 ug via INTRAVENOUS
  Administered 2011-12-17: 50 ug via INTRAVENOUS
  Administered 2011-12-17: 100 ug via INTRAVENOUS
  Administered 2011-12-17: 50 ug via INTRAVENOUS

## 2011-12-17 MED ORDER — MIDAZOLAM HCL 5 MG/5ML IJ SOLN
INTRAMUSCULAR | Status: DC | PRN
  Administered 2011-12-17: 2 mg via INTRAVENOUS

## 2011-12-17 MED ORDER — PHENYLEPHRINE HCL 10 MG/ML IJ SOLN
INTRAMUSCULAR | Status: DC | PRN
  Administered 2011-12-17: 40 ug via INTRAVENOUS
  Administered 2011-12-17 (×2): 80 ug via INTRAVENOUS
  Administered 2011-12-17: 40 ug via INTRAVENOUS
  Administered 2011-12-17: 80 ug via INTRAVENOUS

## 2011-12-17 MED ORDER — 0.9 % SODIUM CHLORIDE (POUR BTL) OPTIME
TOPICAL | Status: DC | PRN
  Administered 2011-12-17: 2000 mL

## 2011-12-17 MED ORDER — DIPHENHYDRAMINE HCL 12.5 MG/5ML PO ELIX
12.5000 mg | ORAL_SOLUTION | Freq: Four times a day (QID) | ORAL | Status: DC | PRN
  Filled 2011-12-17: qty 5

## 2011-12-17 MED ORDER — SODIUM CHLORIDE 0.9 % IJ SOLN: 9.0000 mL | INTRAMUSCULAR | Status: DC | PRN

## 2011-12-17 MED ORDER — NEOSTIGMINE METHYLSULFATE 1 MG/ML IJ SOLN
INTRAMUSCULAR | Status: DC | PRN
  Administered 2011-12-17: 4 mg via INTRAVENOUS

## 2011-12-17 MED ORDER — ONDANSETRON HCL 4 MG/2ML IJ SOLN
4.0000 mg | Freq: Four times a day (QID) | INTRAMUSCULAR | Status: DC | PRN
  Administered 2011-12-21: 4 mg via INTRAVENOUS
  Filled 2011-12-17: qty 2

## 2011-12-17 SURGICAL SUPPLY — 89 items
APPLIER CLIP 5 13 M/L LIGAMAX5 (MISCELLANEOUS) ×2
APR CLP MED LRG 5 ANG JAW (MISCELLANEOUS) ×1
CANISTER SUCTION 2500CC (MISCELLANEOUS) ×4 IMPLANT
CATH KIT ON Q 5IN SLV (PAIN MANAGEMENT) ×1 IMPLANT
CATH THORACIC 28FR (CATHETERS) ×1 IMPLANT
CATH THORACIC 36FR (CATHETERS) IMPLANT
CATH THORACIC 36FR RT ANG (CATHETERS) ×1 IMPLANT
CLIP APPLIE 5 13 M/L LIGAMAX5 (MISCELLANEOUS) IMPLANT
CLIP TI MEDIUM 24 (CLIP) ×1 IMPLANT
CLIP TI MEDIUM 6 (CLIP) ×2 IMPLANT
CLOTH BEACON ORANGE TIMEOUT ST (SAFETY) ×2 IMPLANT
CONN ST 1/4X3/8  BEN (MISCELLANEOUS) ×1
CONN ST 1/4X3/8 BEN (MISCELLANEOUS) IMPLANT
CONN Y 3/8X3/8X3/8  BEN (MISCELLANEOUS) ×1
CONN Y 3/8X3/8X3/8 BEN (MISCELLANEOUS) IMPLANT
CONT SPEC 4OZ CLIKSEAL STRL BL (MISCELLANEOUS) ×10 IMPLANT
COVER SURGICAL LIGHT HANDLE (MISCELLANEOUS) ×2 IMPLANT
DRAIN CHANNEL 28F RND 3/8 FF (WOUND CARE) IMPLANT
DRAIN CHANNEL 32F RND 10.7 FF (WOUND CARE) IMPLANT
DRAPE LAPAROSCOPIC ABDOMINAL (DRAPES) ×2 IMPLANT
DRAPE PROXIMA HALF (DRAPES) ×1 IMPLANT
DRAPE SLUSH MACHINE 52X66 (DRAPES) IMPLANT
DRAPE SLUSH/WARMER DISC (DRAPES) ×1 IMPLANT
DRESSING OPSITE X SMALL 2X3 (GAUZE/BANDAGES/DRESSINGS) ×1 IMPLANT
ELECT BLADE 6.5 EXT (BLADE) ×1 IMPLANT
ELECT REM PT RETURN 9FT ADLT (ELECTROSURGICAL) ×2
ELECTRODE REM PT RTRN 9FT ADLT (ELECTROSURGICAL) ×1 IMPLANT
GLOVE BIO SURGEON STRL SZ 6 (GLOVE) ×1 IMPLANT
GLOVE BIOGEL PI IND STRL 6 (GLOVE) IMPLANT
GLOVE BIOGEL PI IND STRL 6.5 (GLOVE) IMPLANT
GLOVE BIOGEL PI IND STRL 7.0 (GLOVE) IMPLANT
GLOVE BIOGEL PI INDICATOR 6 (GLOVE) ×1
GLOVE BIOGEL PI INDICATOR 6.5 (GLOVE) ×1
GLOVE BIOGEL PI INDICATOR 7.0 (GLOVE) ×1
GLOVE EUDERMIC 7 POWDERFREE (GLOVE) ×4 IMPLANT
GOWN PREVENTION PLUS XLARGE (GOWN DISPOSABLE) ×3 IMPLANT
GOWN STRL NON-REIN LRG LVL3 (GOWN DISPOSABLE) ×5 IMPLANT
HANDLE STAPLE ENDO GIA SHORT (STAPLE) ×1
KIT BASIN OR (CUSTOM PROCEDURE TRAY) ×2 IMPLANT
KIT ROOM TURNOVER OR (KITS) ×2 IMPLANT
KIT SUCTION CATH 14FR (SUCTIONS) ×2 IMPLANT
NS IRRIG 1000ML POUR BTL (IV SOLUTION) ×4 IMPLANT
PACK CHEST (CUSTOM PROCEDURE TRAY) ×2 IMPLANT
PAD ARMBOARD 7.5X6 YLW CONV (MISCELLANEOUS) ×4 IMPLANT
PENCIL BUTTON HOLSTER BLD 10FT (ELECTRODE) ×1 IMPLANT
POUCH ENDO CATCH II 15MM (MISCELLANEOUS) IMPLANT
RELOAD EGIA 45 MED/THCK PURPLE (STAPLE) ×1 IMPLANT
RELOAD EGIA 45 TAN VASC (STAPLE) ×2 IMPLANT
RELOAD EGIA 60 MED/THCK PURPLE (STAPLE) ×8 IMPLANT
RELOAD GIA 30 2.0 ROTI (ENDOMECHANICALS) ×3 IMPLANT
RELOAD STAPLE 60 MED/THCK ART (STAPLE) IMPLANT
SEALANT PROGEL (MISCELLANEOUS) ×1 IMPLANT
SEALANT SURG COSEAL 4ML (VASCULAR PRODUCTS) IMPLANT
SEALANT SURG COSEAL 8ML (VASCULAR PRODUCTS) IMPLANT
SOLUTION ANTI FOG 6CC (MISCELLANEOUS) ×4 IMPLANT
SPECIMEN JAR MEDIUM (MISCELLANEOUS) ×2 IMPLANT
SPONGE GAUZE 4X4 12PLY (GAUZE/BANDAGES/DRESSINGS) ×3 IMPLANT
STAPLER ENDO GIA 12 SHRT THIN (STAPLE) IMPLANT
STAPLER ENDO GIA 12MM SHORT (STAPLE) ×1 IMPLANT
SUT PROLENE 4 0 RB 1 (SUTURE) ×2
SUT PROLENE 4-0 RB1 .5 CRCL 36 (SUTURE) IMPLANT
SUT SILK  1 MH (SUTURE) ×2
SUT SILK 1 MH (SUTURE) ×1 IMPLANT
SUT SILK 2 0 SH CR/8 (SUTURE) ×1 IMPLANT
SUT SILK 2 0SH CR/8 30 (SUTURE) IMPLANT
SUT SILK 3 0SH CR/8 30 (SUTURE) IMPLANT
SUT VIC AB 0 CTX 27 (SUTURE) IMPLANT
SUT VIC AB 1 CTX 18 (SUTURE) ×1 IMPLANT
SUT VIC AB 1 CTX 27 (SUTURE) ×1 IMPLANT
SUT VIC AB 2-0 CT1 27 (SUTURE)
SUT VIC AB 2-0 CT1 TAPERPNT 27 (SUTURE) IMPLANT
SUT VIC AB 2-0 CTX 36 (SUTURE) ×1 IMPLANT
SUT VIC AB 3-0 MH 27 (SUTURE) IMPLANT
SUT VIC AB 3-0 SH 27 (SUTURE)
SUT VIC AB 3-0 SH 27X BRD (SUTURE) IMPLANT
SUT VIC AB 3-0 X1 27 (SUTURE) ×4 IMPLANT
SUT VICRYL 0 UR6 27IN ABS (SUTURE) ×4 IMPLANT
SUT VICRYL 2 TP 1 (SUTURE) ×1 IMPLANT
SWAB COLLECTION DEVICE MRSA (MISCELLANEOUS) IMPLANT
SYSTEM SAHARA CHEST DRAIN ATS (WOUND CARE) ×2 IMPLANT
TAPE CLOTH SURG 4X10 WHT LF (GAUZE/BANDAGES/DRESSINGS) ×2 IMPLANT
TIP APPLICATOR SPRAY EXTEND 16 (VASCULAR PRODUCTS) ×1 IMPLANT
TOWEL OR 17X24 6PK STRL BLUE (TOWEL DISPOSABLE) ×2 IMPLANT
TOWEL OR 17X26 10 PK STRL BLUE (TOWEL DISPOSABLE) ×4 IMPLANT
TRAP SPECIMEN MUCOUS 40CC (MISCELLANEOUS) ×1 IMPLANT
TRAY FOLEY CATH 14FR (SET/KITS/TRAYS/PACK) ×2 IMPLANT
TUBE ANAEROBIC SPECIMEN COL (MISCELLANEOUS) IMPLANT
TUNNELER SHEATH ON-Q 11GX8 (MISCELLANEOUS) ×1 IMPLANT
WATER STERILE IRR 1000ML POUR (IV SOLUTION) ×4 IMPLANT

## 2011-12-17 NOTE — Progress Notes (Signed)
SBP in 70-80's. Dr Noreene Larsson here. IV albumin X 2 given as ord.  Dr Noreene Larsson giving some IV NEO bolus and drip begun at 20cc per his order. Will titrate to keep SBP > 90.

## 2011-12-17 NOTE — Progress Notes (Signed)
Dr Dorris Fetch here to see pt. Aware recheck potassium 3.8 and CA 7.9  No new orders. OK to tx to [id number]

## 2011-12-17 NOTE — Progress Notes (Signed)
Dr Dorris Fetch here to see pt.  Weaning NEO drip. Recheck bmet has been sent.

## 2011-12-17 NOTE — H&P (Signed)
Related encounter: Surgical Consult from 11/28/2011 in [facility]  PCP is Allean Found, MD, MD Referring Provider is Leslye Peer, MD    Chief Complaint   Patient presents with   .  Lung Mass       Referral from Dr Delton Coombes for Lung  Mass, PET Scan 11/06/11      HPI: Valerie Mosley is a 61 year old woman sent for consultation by Dr. Delton Coombes regarding a left upper lobe nodule. Her chief complaint is a lung nodule. She is a smoker, with an 80 pack year history. She was in her usual state of good health until November of 2012 when she developed hemoptysis. She was diagnosed with pneumonia and was hospitalized and in the ICU for several days. CT scan at that time showed a right upper lobe pneumonia, but also a left upper lobe nodule. She had a repeat CT in December which showed clearing of the right lung process but persistence of the lung nodule. A PET CT was done in February which showed possible slight interval enlargement of the nodule, there was mild hypermetabolic activity noted in the vicinity of the nodule.      Past Medical History   Diagnosis  Date   .  Hypertension     .  Kyphoscoliosis     .  COPD (chronic obstructive pulmonary disease)     .  Cough  07/29/11       started coughing up blood this am       Past Surgical History   Procedure  Date   .  Urethra surgery     .  Thyroid surgery  1980       1/2 thyroid removed - benign   .  Bronchoscopy        No family history on file.negative for CAD, lung cancer   Social History History   Substance Use Topics   .  Smoking status:  Current Everyday Smoker -- 0.5 packs/day for 40 years       Types:  Cigarettes   .  Smokeless tobacco:  Not on file     Comment: per pt, smoking 4 cigarettes daily   .  Alcohol Use:  Yes         1-2 beers daily       Current Outpatient Prescriptions   Medication  Sig  Dispense  Refill   .  acetaminophen (TYLENOL) 500 MG tablet  Take 500 mg by  mouth every 6 (six) hours as needed.         Marland Kitchen  albuterol (VENTOLIN HFA) 108 (90 BASE) MCG/ACT inhaler  Inhale 2 puffs into the lungs every 6 (six) hours as needed.           Marland Kitchen  amLODipine (NORVASC) 10 MG tablet  Take 10 mg by mouth daily.           Marland Kitchen  HYDROcodone-acetaminophen (VICODIN) 5-500 MG per tablet  Take 1 tablet by mouth every 6 (six) hours as needed. PRN/ Dental         .  loratadine (CLARITIN) 10 MG tablet  Take 10 mg by mouth daily as needed.           .  penicillin v potassium (VEETID) 500 MG tablet  Take as directed when needed for dental infection         .  tiotropium (SPIRIVA HANDIHALER) 18 MCG inhalation capsule  Place 1 capsule (18 mcg total) into inhaler and  inhale daily.   30 capsule   11      No Known Allergies   Review of Systems  Constitutional: Negative for fever, appetite change, fatigue and unexpected weight change.  Eyes: Negative.   Respiratory: Positive for cough (hemoptysis in november with pneumonia) and wheezing.         Pneumonia 11/12  Genitourinary: Negative.   Musculoskeletal: Positive for back pain and joint swelling.  Neurological: Negative.   Hematological: Negative.  Does not bruise/bleed easily.  All other systems reviewed and are negative.   BP 104/76  Pulse 100  Resp 20  Ht 5' 5.5" (1.664 m)  Wt 132 lb (59.875 kg)  BMI 21.63 kg/m2  SpO2 97% Physical Exam  Constitutional: She is oriented to person, place, and time. She appears well-developed and well-nourished. No distress.  HENT:   Head: Normocephalic and atraumatic.  Eyes: EOM are normal.  Neck: Neck supple. No tracheal deviation present. No thyromegaly present.  Cardiovascular: Normal rate, regular rhythm, normal heart sounds and intact distal pulses.   Pulmonary/Chest: Effort normal. No respiratory distress. She has no wheezes. She has no rales.  Abdominal: Soft. There is no tenderness.  Musculoskeletal: She exhibits no edema.  Lymphadenopathy:    She has no cervical  adenopathy.  Neurological: She is alert and oriented to person, place, and time.  Skin: Skin is warm and dry.      Diagnostic Tests: CT is from November and December 2012 reviewed, findings as previously noted PET/CT from 11/06/2011 reviewed findings as previously noted   Impression: 61 year old woman with a history of tobacco abuse who presents with a left upper lobe nodule which is mildly the metabolic by PET, this lesion is highly suspicious for primary bronchogenic carcinoma. She did have bronchoscopic biopsy which was unrevealing however this is relatively small and peripheral nodule very susceptible to a false-negative find by bronchoscopic biopsy.   I recommended to Mrs. Coplen that she have a left VATS, wedge resection for definitive diagnostic purposes. I also recommended was that if frozen section examination this nodule revealed this to be a lung cancer that we would proceed with definitive treatment in the form of a left upper lobectomy and lymph node dissection. She had any questions regarding the need for biopsy, as well as basing of lobectomy on the frozen section results.   I discussed with her the general nature of the procedure, need for general anesthesia, incision is to be used, an intraoperative decision making. I discussed with her the indications, risks, benefits, and alternative approaches. In this case the alternative would be to continue to observe this with serial CT scans, the obvious downside to grower spread in the interim if it is in fact malignant. She understands the risk of surgery include but are not limited to death, MI, DVT, PE, infection, bleeding, possible need for transfusion, prolonged air leak.   She is leaning towards proceeding with a wedge resection and definitive lobectomy if necessary based on frozen section, however she wishes to discuss these issues with her husband before making a commitment.   Plan: Left VATS, wedge resection, possible  lobectomy Will schedule as the patient had a chance to discuss with her husband and decide when she wants to proceed.

## 2011-12-17 NOTE — Transfer of Care (Signed)
Immediate Anesthesia Transfer of Care Note  Patient: Valerie Mosley  Procedure(s) Performed: Procedure(s) (LRB): VIDEO ASSISTED THORACOSCOPY (VATS)/WEDGE RESECTION (Left) LOBECTOMY (Left)  Patient Location: PACU  Anesthesia Type: General  Level of Consciousness: awake and alert   Airway & Oxygen Therapy: Patient Spontanous Breathing and Patient connected to face mask oxygen  Post-op Assessment: Report given to PACU RN and Post -op Vital signs reviewed and stable  Post vital signs: Reviewed and stable  Complications: No apparent anesthesia complications

## 2011-12-17 NOTE — Anesthesia Procedure Notes (Signed)
Procedure Name: Intubation Date/Time: 12/17/2011 1:39 PM Performed by: Jefm Miles E Pre-anesthesia Checklist: Patient identified, Emergency Drugs available, Suction available, Patient being monitored and Timeout performed Patient Re-evaluated:Patient Re-evaluated prior to inductionOxygen Delivery Method: Circle system utilized Preoxygenation: Pre-oxygenation with 100% oxygen Intubation Type: IV induction Ventilation: Mask ventilation without difficulty and Oral airway inserted - appropriate to patient size Laryngoscope Size: Mac and 3 Grade View: Grade I Tube type: Oral Endobronchial tube: Left, Double lumen EBT and EBT position confirmed by fiberoptic bronchoscope and 35 Fr Number of attempts: 2 Airway Equipment and Method: Stylet Placement Confirmation: ETT inserted through vocal cords under direct vision,  positive ETCO2 and breath sounds checked- equal and bilateral Secured at: 28 cm Tube secured with: Tape Dental Injury: Teeth and Oropharynx as per pre-operative assessment and Injury to lip

## 2011-12-17 NOTE — Anesthesia Postprocedure Evaluation (Signed)
  Anesthesia Post-op Note  Patient: Valerie Mosley  Procedure(s) Performed: Procedure(s) (LRB): VIDEO ASSISTED THORACOSCOPY (VATS)/WEDGE RESECTION (Left) LOBECTOMY (Left)  Patient Location: PACU  Anesthesia Type: General  Level of Consciousness: awake  Airway and Oxygen Therapy: Patient Spontanous Breathing  Post-op Pain: mild  Post-op Assessment: Post-op Vital signs reviewed  Post-op Vital Signs: Reviewed  Complications: No apparent anesthesia complications

## 2011-12-17 NOTE — Brief Op Note (Addendum)
                   [address]            [place] [address]          [phone]    12/17/2011  4:40 PM  PATIENT:  [name]  61 y.o. female  PRE-OPERATIVE DIAGNOSIS:  LEFT UPPER LOBE NODULE  POST-OPERATIVE DIAGNOSIS:  LEFT UPPER LOBE NODULE  PROCEDURE:  Procedure(s): VIDEO ASSISTED THORACOSCOPY (VATS)/WEDGE RESECTION/COMPLETION LEFT UPPER LOBECTOMY , LND, PLACEMENT of On-Q LOCAL ANESTHETIC CATHETER  SURGEON:  Surgeon(s): Loreli Slot, MD  PHYSICIAN ASSISTANT: WAYNE GOLD PA-C  ANESTHESIA:   general  SPECIMEN:  Source of Specimen:  LEFT UPPER LOBE AND MULTIPLE LYMPH NODE SAMPLES  DISPOSITION OF SPECIMEN:  Pathology  DRAINS: 2 Chest Tube(s) in the L HEMITHORAX   PATIENT CONDITION:  PACU - hemodynamically stable.  Frozen section + adenocarcinoma Multiple enlarged nodes, particularly level 5

## 2011-12-17 NOTE — Preoperative (Signed)
Beta Blockers   Reason not to administer Beta Blockers:Not Applicable 

## 2011-12-17 NOTE — Interval H&P Note (Signed)
History and Physical Interval Note:  12/17/2011 12:55 PM  Valerie Mosley  has presented today for surgery, with the diagnosis of LEFT UPPER LOBE NODULE  The various methods of treatment have been discussed with the patient and family. After consideration of risks, benefits and other options for treatment, the patient has consented to  Procedure(s) (LRB): VIDEO ASSISTED THORACOSCOPY (VATS)/WEDGE RESECTION (Left) LOBECTOMY (Left) as a surgical intervention .  The patients' history has been reviewed, patient examined, no change in status, stable for surgery.  I have reviewed the patients' chart and labs.  Questions were answered to the patient's satisfaction.     Nethra Mehlberg C

## 2011-12-17 NOTE — Anesthesia Preprocedure Evaluation (Addendum)
Anesthesia Evaluation  Patient identified by MRN, date of birth, ID band Patient awake    Reviewed: Allergy & Precautions, NPO status , Patient's Chart, lab work & pertinent test results  History of Anesthesia Complications (+) PONV  Airway Mallampati: I TM Distance: >3 FB Neck ROM: Full    Dental  (+) Teeth Intact and Dental Advisory Given   Pulmonary COPD COPD inhaler, Recent URI , Residual Cough,          Cardiovascular hypertension,     Neuro/Psych    GI/Hepatic   Endo/Other    Renal/GU      Musculoskeletal   Abdominal   Peds  Hematology   Anesthesia Other Findings   Reproductive/Obstetrics                          Anesthesia Physical Anesthesia Plan  ASA: III  Anesthesia Plan: General   Post-op Pain Management:    Induction: Intravenous  Airway Management Planned: Double Lumen EBT  Additional Equipment: CVP and Arterial line  Intra-op Plan:   Post-operative Plan: Extubation in OR  Informed Consent:   Plan Discussed with:   Anesthesia Plan Comments:        Anesthesia Quick Evaluation

## 2011-12-17 NOTE — Progress Notes (Signed)
Critical BMET lab values called to dr Dorris Fetch. He wants it rechecked.  Weaning NEO drip.Will cont to monitor closely

## 2011-12-18 ENCOUNTER — Inpatient Hospital Stay (HOSPITAL_COMMUNITY): Payer: BC Managed Care – PPO

## 2011-12-18 LAB — CBC
HCT: 36.1 % (ref 36.0–46.0)
Hemoglobin: 12.2 g/dL (ref 12.0–15.0)
MCH: 31 pg (ref 26.0–34.0)
MCHC: 33.8 g/dL (ref 30.0–36.0)
MCV: 91.6 fL (ref 78.0–100.0)
Platelets: 238 10*3/uL (ref 150–400)
RBC: 3.94 MIL/uL (ref 3.87–5.11)
RDW: 13.7 % (ref 11.5–15.5)
WBC: 11.4 10*3/uL — ABNORMAL HIGH (ref 4.0–10.5)

## 2011-12-18 LAB — BASIC METABOLIC PANEL
BUN: 9 mg/dL (ref 6–23)
CO2: 22 mEq/L (ref 19–32)
Calcium: 7.7 mg/dL — ABNORMAL LOW (ref 8.4–10.5)
Chloride: 105 mEq/L (ref 96–112)
Creatinine, Ser: 0.52 mg/dL (ref 0.50–1.10)
GFR calc Af Amer: >90 mL/min (ref >90)
GFR calc non Af Amer: >90 mL/min (ref >90)
Glucose, Bld: 130 mg/dL — ABNORMAL HIGH (ref 70–99)
Potassium: 3.9 mEq/L (ref 3.5–5.1)
Sodium: 134 mEq/L — ABNORMAL LOW (ref 135–145)

## 2011-12-18 LAB — BLOOD GAS, ARTERIAL
Acid-base deficit: 1.2 mmol/L (ref 0.0–2.0)
Bicarbonate: 23.3 mEq/L (ref 20.0–24.0)
Drawn by: 290241
O2 Content: 3 L/min
O2 Saturation: 92.8 %
Patient temperature: 98.4
TCO2: 24.5 mmol/L (ref 0–100)
pCO2 arterial: 40.4 mmHg (ref 35.0–45.0)
pH, Arterial: 7.378 (ref 7.350–7.400)
pO2, Arterial: 62.8 mmHg — ABNORMAL LOW (ref 80.0–100.0)

## 2011-12-18 NOTE — Progress Notes (Signed)
1 Day Post-Op Procedure(s) (LRB): VIDEO ASSISTED THORACOSCOPY (VATS)/WEDGE RESECTION (Left) LOBECTOMY (Left) Subjective: C/o pain from IJ line Mild incisional pain  Objective: Vital signs in last 24 hours: Temp:  [97.9 F (36.6 C)-98.4 F (36.9 C)] 98.1 F (36.7 C) (04/03 0800) Pulse Rate:  [71-114] 77  (04/03 0800) Cardiac Rhythm:  [-] Normal sinus rhythm (04/03 0800) Resp:  [13-35] 16  (04/03 0804) BP: (85-110)/(50-80) 110/75 mmHg (04/03 0800) SpO2:  [93 %-98 %] 94 % (04/03 0804) Arterial Line BP: (62-119)/(48-83) 105/63 mmHg (04/03 0700) FiO2 (%):  [3 %] 3 % (04/03 0804) Weight:  [128 lb 4.9 oz (58.2 kg)-138 lb 7.2 oz (62.8 kg)] 138 lb 7.2 oz (62.8 kg) (04/02 2100)  Hemodynamic parameters for last 24 hours:    Intake/Output from previous day: 04/02 0701 - 04/03 0700 In: 4000 [I.V.:4000] Out: 1740 [Urine:1000; Blood:150; Chest Tube:590] Intake/Output this shift: Total I/O In: 465 [P.O.:240; I.V.:125; IV Piggyback:100] Out: 350 [Urine:200; Chest Tube:150]  General appearance: alert and no distress Heart: regular rate and rhythm Lungs: diminished breath sounds base - left Abdomen: normal findings: soft, non-tender  Lab Results:  Basename 12/18/11 0415 12/17/11 1746  WBC 11.4* 12.4*  HGB 12.2 8.6*  HCT 36.1 25.6*  PLT 238 166   BMET:  Basename 12/18/11 0415 12/17/11 1856  NA 134* 139  K 3.9 3.8  CL 105 106  CO2 22 21  GLUCOSE 130* 118*  BUN 9 11  CREATININE 0.52 0.53  CALCIUM 7.7* 7.9*    PT/INR:  Basename 12/16/11 0939  LABPROT 13.5  INR 1.01   ABG    Component Value Date/Time   PHART 7.378 12/18/2011 0523   HCO3 23.3 12/18/2011 0523   TCO2 24.5 12/18/2011 0523   ACIDBASEDEF 1.2 12/18/2011 0523   O2SAT 92.8 12/18/2011 0523   CBG (last 3)  No results found for this basename: GLUCAP:3 in the last 72 hours  Assessment/Plan: S/P Procedure(s) (LRB): VIDEO ASSISTED THORACOSCOPY (VATS)/WEDGE RESECTION (Left) LOBECTOMY (Left) POD # 1 CV- stable RESP -  s/p LUL, pulmonary toilet  Keep both CT today,has a small air leak PCA for pain control Tolerating PO- IV to KVO PAS, lovenox for DVT prophylaxis   LOS: 1 day    Alleya Demeter C 12/18/2011

## 2011-12-18 NOTE — Progress Notes (Signed)
UR Completed.  Saraiya Kozma Jane [phone] 12/18/2011  

## 2011-12-18 NOTE — Op Note (Signed)
NAME:  Valerie Mosley, Valerie Mosley NO.:  [id number]  MEDICAL RECORD NO.:  [id number]  LOCATION:  3309                         FACILITY:  [facility]  PHYSICIAN:  Salvatore Decent. Dorris Fetch, M.D.DATE OF BIRTH:  07-19-1951  DATE OF PROCEDURE:  12/17/2011 DATE OF DISCHARGE:                              OPERATIVE REPORT   PREOPERATIVE DIAGNOSIS:  Left upper lobe nodule.  POSTOPERATIVE DIAGNOSIS:  Adenocarcinoma of the left upper lobe.  PROCEDURE:  Left video-assisted thoracoscopy, wedge resection, left upper lobectomy, lymph node dissection, On-Q local anesthetic catheter placement.  SURGEON:  Salvatore Decent. Dorris Fetch, MD  ASSISTANT:  Rowe Clack, PA-C  ANESTHESIA:  General.  FINDINGS:  Left upper lobe nodule positive for adenocarcinoma on frozen section.  Multiple large lymph nodes particularly at the level 5 nodes, but otherwise benign appearance other than size.  CLINICAL NOTE:  Valerie Mosley is a 61 year old woman with a significant smoking history, who has had pneumonia and hemoptysis in November 2012. CT at that time showed a right upper lobe pneumonia and a left upper lobe nodule.  Repeat CT in December showed clearing of the right lung process, but persistence of the lung nodule.  A PET-CT subsequently showed slight interval enlargement of the nodule and mild metabolic activity noted in the nodule, which is suspicious for bronchogenic cancer.  She was advised to undergo left VATS, wedge resection for definitive diagnosis and then proceed with the lobectomy if the frozen section was positive for cancer.  She understood and accepted the risks of the procedure, and she understood the intraoperative decision-making and agreed to proceed.  OPERATIVE NOTE:  Valerie Mosley was brought to the preop holding area on December 17, 2011, there, the Anesthesia Service placed a central line and arterial blood pressure monitoring line.  PAS hose were in place for DVT prophylaxis.  Intravenous  antibiotics were administered.  She was taken to the operating room, anesthetized, and intubated with a double-lumen endotracheal tube.  Foley catheter was placed.  She was placed into a right lateral decubitus position and the left chest was prepped and draped in the usual fashion.  Single lung ventilation of the right lung was carried out and was tolerated well throughout the procedure.  An incision was made in the seventh intercostal space and the midaxillary line was carried through the skin and subcutaneous tissue.  The chest was entered bluntly using a hemostat.  The scope was inserted.  There was no effusion.  The visceral pleural surface appeared normal as did the parietal pleural surface.  There was persistent aeration of the lung.  Suction was applied and there was good deflation of the lung. There was excellent exposure.  Additional incisions were made with a port-type incision posteriorly and then a small utility incision anteriorly.  The muscles were divided, the ribs were not spread at any time during the procedure.  The initial incision was approximately 4 cm in length.  The nodule was palpated in the anterior superior aspect of the left upper lobe.  Wedge resection was performed with sequential firings of an Endo-GIA stapler.  The specimen was subpleural and the specimen was removed directly through the incision and sent for frozen section.  While awaiting frozen section, the inferior ligament was divided, two level 9 nodes were identified and sent for pathology.  The hilum was divided medially exposing the pulmonary veins.  Work was begun completing the major fissure, which was nearly complete.  By this point, the frozen section had returned positive for adenocarcinoma.  The hilar reflection then was divided more superiorly.  At this point, a large level 5 node was encountered.  This was approximately 3 cm in greatest dimension.  Overall, dimension was approximately 3 x 2  x 1 cm.  Some bleeding was encountered while dissecting out this node.  This area was then packed and left for later examination.  The superior pulmonary vein then was dissected out and carefully encircled and then divided with an Endo-GIA vascular stapler with a 2 mm stapled up.  There was good hemostasis at the staple line.  Next, the dissection was carried along the pulmonary artery and anterior apical branches of the left upper lobe were dissected out and divided with the vascular stapler.  Then, attention was turned back to the fissure.  A small amount of confluent lung tissue was present overlying the pulmonary artery and fissure. This was divided with an Endo-GIA stapler.  This allowed dissection and encirclement of the lingular pulmonary arterial branches.  These were divided with an Endo-GIA stapler.  The posterior upper lobe segmental pulmonary arterial branch then was identified and divided with the Endo- GIA stapler.  At this point, the major fissure was completely separated and although was left was to divide the bronchus.  The stapler was placed across the left upper lobe bronchus.  A test inflation showed good aeration of all segments of the lower lobe.  The stapler then was fired and the specimen was completely freed at this point.  However, the specimen was too large to remove through the original incision.  The anterior utility incision was then lengthened to allow the left upper lobe to be removed as our previous margins were clear.  Additional frozen sections were not necessary on this specimen, it will be sent for permanent section only.  It should be noted that during the dissection of the vessels, multiple level 11 lymph nodes were taken as separate specimens.  A level 10 node was identified along the bronchus and taken the level 5 node that had been identified earlier, now was taken without difficulty, without any additional bleeding.  Further inspection revealed  no additional suspicious nodes.  ProGel was applied to the staple line.  An On-Q local anesthetic catheter was placed through separate stab posteriorly and tunneled into a subpleural location.  A 36- French right-angle chest tube was placed posteriorly through the original port incision, the second port incision was made and a 28- French chest tube was tunneled anteriorly.  The lower lobe was reinflated the posterior port incision, which had been used for traction, was closed with a #1 Vicryl fascial suture and a 3-0 Vicryl subcuticular suture and the small utility incision was closed with 2 layers of running #1 Vicryl suture to close the muscular layers and then a 2-0 Vicryl subcutaneous suture and a 3-0 Vicryl subcuticular suture. All sponge, needle, and instrument counts were correct at the end of the procedure.  The patient was extubated in the operating room and taken to the postanesthetic care unit in good condition.     Salvatore Decent Dorris Fetch, M.D.     SCH/MEDQ  D:  12/17/2011  T:  12/18/2011  Job:  [id number]

## 2011-12-19 ENCOUNTER — Encounter (HOSPITAL_COMMUNITY): Payer: Self-pay | Admitting: Thoracic Surgery (Cardiothoracic Vascular Surgery)

## 2011-12-19 ENCOUNTER — Inpatient Hospital Stay (HOSPITAL_COMMUNITY): Payer: BC Managed Care – PPO

## 2011-12-19 LAB — COMPREHENSIVE METABOLIC PANEL
ALT: 8 U/L (ref 0–35)
AST: 16 U/L (ref 0–37)
Albumin: 2.7 g/dL — ABNORMAL LOW (ref 3.5–5.2)
Alkaline Phosphatase: 100 U/L (ref 39–117)
BUN: 5 mg/dL — ABNORMAL LOW (ref 6–23)
CO2: 27 mEq/L (ref 19–32)
Calcium: 7.9 mg/dL — ABNORMAL LOW (ref 8.4–10.5)
Chloride: 100 mEq/L (ref 96–112)
Creatinine, Ser: 0.58 mg/dL (ref 0.50–1.10)
GFR calc Af Amer: >90 mL/min (ref >90)
GFR calc non Af Amer: >90 mL/min (ref >90)
Glucose, Bld: 113 mg/dL — ABNORMAL HIGH (ref 70–99)
Potassium: 3.3 mEq/L — ABNORMAL LOW (ref 3.5–5.1)
Sodium: 133 mEq/L — ABNORMAL LOW (ref 135–145)
Total Bilirubin: 0.2 mg/dL — ABNORMAL LOW (ref 0.3–1.2)
Total Protein: 5.4 g/dL — ABNORMAL LOW (ref 6.0–8.3)

## 2011-12-19 LAB — CBC
HCT: 35.7 % — ABNORMAL LOW (ref 36.0–46.0)
Hemoglobin: 11.9 g/dL — ABNORMAL LOW (ref 12.0–15.0)
MCH: 30.4 pg (ref 26.0–34.0)
MCHC: 33.3 g/dL (ref 30.0–36.0)
MCV: 91.3 fL (ref 78.0–100.0)
Platelets: 215 10*3/uL (ref 150–400)
RBC: 3.91 MIL/uL (ref 3.87–5.11)
RDW: 13.9 % (ref 11.5–15.5)
WBC: 12.3 10*3/uL — ABNORMAL HIGH (ref 4.0–10.5)

## 2011-12-19 MED ORDER — POTASSIUM CHLORIDE CRYS ER 20 MEQ PO TBCR
40.0000 meq | EXTENDED_RELEASE_TABLET | Freq: Two times a day (BID) | ORAL | Status: AC
  Administered 2011-12-19 (×2): 40 meq via ORAL
  Filled 2011-12-19 (×2): qty 2

## 2011-12-19 MED ORDER — SODIUM CHLORIDE 0.9 % IJ SOLN
INTRAMUSCULAR | Status: AC
  Filled 2011-12-19: qty 20

## 2011-12-19 MED ORDER — ENOXAPARIN SODIUM 40 MG/0.4ML ~~LOC~~ SOLN
40.0000 mg | SUBCUTANEOUS | Status: DC
  Administered 2011-12-19 – 2011-12-24 (×6): 40 mg via SUBCUTANEOUS
  Filled 2011-12-19 (×7): qty 0.4

## 2011-12-19 NOTE — Progress Notes (Signed)
2 Days Post-Op Procedure(s) (LRB): VIDEO ASSISTED THORACOSCOPY (VATS)/WEDGE RESECTION (Left) LOBECTOMY (Left) Subjective: Woke up in pain in middle of night. Feels better this AM  Objective: Vital signs in last 24 hours: Temp:  [98.2 F (36.8 C)-99.4 F (37.4 C)] 98.2 F (36.8 C) (04/04 0428) Pulse Rate:  [79-96] 79  (04/04 0428) Cardiac Rhythm:  [-] Normal sinus rhythm (04/04 0800) Resp:  [12-27] 12  (04/04 0428) BP: (99-120)/(62-73) 103/68 mmHg (04/04 0428) SpO2:  [90 %-97 %] 94 % (04/04 0428)  Hemodynamic parameters for last 24 hours:    Intake/Output from previous day: 04/03 0701 - 04/04 0700 In: 1978 [P.O.:1320; I.V.:458; IV Piggyback:200] Out: 2520 [Urine:1900; Chest Tube:620] Intake/Output this shift: Total I/O In: -  Out: 50 [Chest Tube:50]  General appearance: alert and no distress Heart: regular rate and rhythm Lungs: diminished breath sounds left side Abdomen: normal findings: soft, non-tender  Lab Results:  Basename 12/19/11 0440 12/18/11 0415  WBC 12.3* 11.4*  HGB 11.9* 12.2  HCT 35.7* 36.1  PLT 215 238   BMET:  Basename 12/19/11 0440 12/18/11 0415  NA 133* 134*  K 3.3* 3.9  CL 100 105  CO2 27 22  GLUCOSE 113* 130*  BUN 5* 9  CREATININE 0.58 0.52  CALCIUM 7.9* 7.7*    PT/INR:  Basename 12/16/11 0939  LABPROT 13.5  INR 1.01   ABG    Component Value Date/Time   PHART 7.378 12/18/2011 0523   HCO3 23.3 12/18/2011 0523   TCO2 24.5 12/18/2011 0523   ACIDBASEDEF 1.2 12/18/2011 0523   O2SAT 92.8 12/18/2011 0523   CBG (last 3)  No results found for this basename: GLUCAP:3 in the last 72 hours  Assessment/Plan: S/P Procedure(s) (LRB): VIDEO ASSISTED THORACOSCOPY (VATS)/WEDGE RESECTION (Left) LOBECTOMY (Left) POD #2 LUL CV- stable RESP- continue IS, cough, MDI RENAL - hypokalemia, supplement Mobilize PAS/ lovenox for DVT prophylaxis   LOS: 2 days    Zarina Pe C 12/19/2011

## 2011-12-20 ENCOUNTER — Inpatient Hospital Stay (HOSPITAL_COMMUNITY): Payer: BC Managed Care – PPO

## 2011-12-20 LAB — CBC
HCT: 35.1 % — ABNORMAL LOW (ref 36.0–46.0)
Hemoglobin: 11.6 g/dL — ABNORMAL LOW (ref 12.0–15.0)
MCH: 30.9 pg (ref 26.0–34.0)
MCHC: 33 g/dL (ref 30.0–36.0)
MCV: 93.4 fL (ref 78.0–100.0)
Platelets: 214 10*3/uL (ref 150–400)
RBC: 3.76 MIL/uL — ABNORMAL LOW (ref 3.87–5.11)
RDW: 13.8 % (ref 11.5–15.5)
WBC: 9.5 10*3/uL (ref 4.0–10.5)

## 2011-12-20 LAB — BASIC METABOLIC PANEL
BUN: 4 mg/dL — ABNORMAL LOW (ref 6–23)
CO2: 26 mEq/L (ref 19–32)
Calcium: 8.4 mg/dL (ref 8.4–10.5)
Chloride: 103 mEq/L (ref 96–112)
Creatinine, Ser: 0.48 mg/dL — ABNORMAL LOW (ref 0.50–1.10)
GFR calc Af Amer: >90 mL/min (ref >90)
GFR calc non Af Amer: >90 mL/min (ref >90)
Glucose, Bld: 96 mg/dL (ref 70–99)
Potassium: 4.4 mEq/L (ref 3.5–5.1)
Sodium: 135 mEq/L (ref 135–145)

## 2011-12-20 MED ORDER — MOXIFLOXACIN HCL IN NACL 400 MG/250ML IV SOLN
400.0000 mg | INTRAVENOUS | Status: DC
  Administered 2011-12-20 – 2011-12-21 (×2): 400 mg via INTRAVENOUS
  Filled 2011-12-20 (×3): qty 250

## 2011-12-20 NOTE — Progress Notes (Addendum)
                    [address]            [place] [address]          [phone]     3 Days Post-Op Procedure(s) (LRB): VIDEO ASSISTED THORACOSCOPY (VATS)/WEDGE RESECTION (Left) LOBECTOMY (Left)  Subjective: Still gets SOB with activity.  On 4L O2. Very little cough.  Became nauseated when ambulating last night, but feels better this am.  Objective: Vital signs in last 24 hours: Patient Vitals for the past 24 hrs:  BP Temp Temp src Pulse Resp SpO2  12/20/11 0810 113/74 mmHg 98.2 F (36.8 C) Oral - - -  12/20/11 0402 111/75 mmHg 98.5 F (36.9 C) Oral 77  11  94 %  12/19/11 2336 122/72 mmHg 98.6 F (37 C) Oral 84  19  92 %  12/19/11 2004 118/73 mmHg 98.8 F (37.1 C) Oral 120  20  96 %  12/19/11 1600 111/62 mmHg 98.4 F (36.9 C) Oral - 16  98 %  12/19/11 1545 - 98.5 F (36.9 C) Oral - - -  12/19/11 1200 108/75 mmHg 98 F (36.7 C) Oral - - -  12/19/11 1025 101/68 mmHg - - - - -   Current Weight  12/17/11 62.8 kg (138 lb 7.2 oz)     Intake/Output from previous day: 04/04 0701 - 04/05 0700 In: -  Out: 2920 [Urine:2550; Chest Tube:370]    PHYSICAL EXAM:  Heart: RRR Lungs: decreased BS in bases Wound: clean and dry Chest tube: 1/7 air leak with cough  Lab Results: CBC: Basename 12/20/11 0426 12/19/11 0440  WBC 9.5 12.3*  HGB 11.6* 11.9*  HCT 35.1* 35.7*  PLT 214 215   BMET:  Basename 12/20/11 0426 12/19/11 0440  NA 135 133*  K 4.4 3.3*  CL 103 100  CO2 26 27  GLUCOSE 96 113*  BUN 4* 5*  CREATININE 0.48* 0.58  CALCIUM 8.4 7.9*    PT/INR: No results found for this basename: LABPROT,INR in the last 72 hours  CXR: IMPRESSION:  1. Support apparatus unchanged. No pneumothorax.  2. Persistent bibasilar atelectasis.  3. Similar interstitial and patchy air space disease in the right  upper lobe concerning for possible infection   Assessment/Plan: S/P Procedure(s) (LRB): VIDEO ASSISTED THORACOSCOPY (VATS)/WEDGE RESECTION  (Left) LOBECTOMY (Left) Small air leak, CXR stable.  ?d/c 1 CT today. Continue aggressive pulm toilet, ambulate today.   LOS: 3 days    COLLINS,GINA H 12/20/2011  Currently there is a lot of to and fro motion in the tube but no definite air leak. Will d/c anterior CT Ambulated around unit earlier today

## 2011-12-20 NOTE — Progress Notes (Signed)
Ant CT D/c per MD order, pt tol well, pt educated to N/RN for SOB, CP, or any changes, pt verbalized understanding of CT removal

## 2011-12-20 NOTE — Progress Notes (Signed)
Pt smokes 2 ppd and has cut down to 4 cigarettes per day prior to coming to the hospital. She states she plans to quit cold [place] after d/c. Congratulated pt on cutting down and encouraged her to quit completely. Discussed different pharmacotherapy options available also if she can't quit on her own after d/c. Referred to 1-800 quit now for f/u and support. Discussed oral fixation substitutes, second hand smoke and in home smoking policy. Reviewed and gave pt Written education/contact information.

## 2011-12-21 ENCOUNTER — Inpatient Hospital Stay (HOSPITAL_COMMUNITY): Payer: BC Managed Care – PPO

## 2011-12-21 MED ORDER — GUAIFENESIN ER 600 MG PO TB12
600.0000 mg | ORAL_TABLET | Freq: Two times a day (BID) | ORAL | Status: DC
  Administered 2011-12-21 – 2011-12-24 (×8): 600 mg via ORAL
  Filled 2011-12-21 (×10): qty 1

## 2011-12-21 MED ORDER — ACETAMINOPHEN 325 MG PO TABS: 650.0000 mg | ORAL_TABLET | Freq: Four times a day (QID) | ORAL | Status: DC | PRN

## 2011-12-21 NOTE — Progress Notes (Addendum)
                    [address]            [place] [address]          [phone]     4 Days Post-Op Procedure(s) (LRB): VIDEO ASSISTED THORACOSCOPY (VATS)/WEDGE RESECTION (Left) LOBECTOMY (Left)  Subjective: OOB in chair.  SOB with exertion.  Coughed up some thick mucus this am. No more nausea.  Objective: Vital signs in last 24 hours: Patient Vitals for the past 24 hrs:  BP Temp Temp src Pulse Resp SpO2  12/21/11 0500 - - - 92  23  94 %  12/21/11 0400 - - - 81  18  95 %  12/21/11 0306 115/73 mmHg 98.4 F (36.9 C) Oral 92  17  96 %  12/21/11 0000 112/81 mmHg 98.1 F (36.7 C) Oral 80  18  96 %  12/20/11 2000 107/68 mmHg 98.1 F (36.7 C) Oral 109  23  92 %  12/20/11 1640 103/72 mmHg 98.6 F (37 C) Oral 102  20  94 %  12/20/11 1600 - - - - 20  94 %  12/20/11 1200 - - - - 22  93 %  12/20/11 1156 109/76 mmHg 99 F (37.2 C) Oral 114  22  93 %  12/20/11 1036 106/76 mmHg - - - - -  12/20/11 1008 - - - - - 94 %  12/20/11 0810 113/74 mmHg 98.2 F (36.8 C) Oral 85  21  96 %  12/20/11 0800 - - - - 21  96 %   Current Weight  12/17/11 62.8 kg (138 lb 7.2 oz)     Intake/Output from previous day: 04/05 0701 - 04/06 0700 In: 400 [I.V.:400] Out: 3140 [Urine:2850; Chest Tube:290]    PHYSICAL EXAM:  Heart: RRR  Lungs: generally clear on R, diminished on L Wound: clean and dry Chest tube: no air leak  Lab Results: CBC: Basename 12/20/11 0426 12/19/11 0440  WBC 9.5 12.3*  HGB 11.6* 11.9*  HCT 35.1* 35.7*  PLT 214 215   BMET:  Basename 12/20/11 0426 12/19/11 0440  NA 135 133*  K 4.4 3.3*  CL 103 100  CO2 26 27  GLUCOSE 96 113*  BUN 4* 5*  CREATININE 0.48* 0.58  CALCIUM 8.4 7.9*    CXR: No pneumothorax status post removal of one of the right chest  tubes. Stable aeration and bilateral airspace opacities.    Assessment/Plan: S/P Procedure(s) (LRB): VIDEO ASSISTED THORACOSCOPY (VATS)/WEDGE RESECTION (Left) LOBECTOMY (Left) CT no air leak  this am, CXR stable.  Possible d/c remaining CT.   Avelox D#2 for ?RUL infiltrate. No fever.  Will add Mucinex for cough. Still on 4L O2.  Continue pulm toilet, wean O2 as tolerated. D/c Foley, mobilize.     LOS: 4 days    COLLINS,GINA H 12/21/2011  Patient seen and examined. Agree with above. CT drainage down overnight- will d/c CT D/C PCA

## 2011-12-22 ENCOUNTER — Inpatient Hospital Stay (HOSPITAL_COMMUNITY): Payer: BC Managed Care – PPO

## 2011-12-22 MED ORDER — POLYETHYLENE GLYCOL 3350 17 G PO PACK
17.0000 g | PACK | Freq: Every day | ORAL | Status: DC | PRN
  Administered 2011-12-22: 17 g via ORAL
  Filled 2011-12-22: qty 1

## 2011-12-22 MED ORDER — MAGNESIUM HYDROXIDE 400 MG/5ML PO SUSP
30.0000 mL | Freq: Every day | ORAL | Status: DC | PRN
  Filled 2011-12-22: qty 30

## 2011-12-22 MED ORDER — LACTULOSE 10 GM/15ML PO SOLN
30.0000 g | Freq: Every day | ORAL | Status: DC | PRN
  Administered 2011-12-22: 30 g via ORAL
  Filled 2011-12-22 (×2): qty 45

## 2011-12-22 MED ORDER — FLEET ENEMA 7-19 GM/118ML RE ENEM: 1.0000 | ENEMA | Freq: Every day | RECTAL | Status: DC | PRN

## 2011-12-22 MED ORDER — MOXIFLOXACIN HCL 400 MG PO TABS
400.0000 mg | ORAL_TABLET | Freq: Every day | ORAL | Status: DC
  Administered 2011-12-22 – 2011-12-24 (×3): 400 mg via ORAL
  Filled 2011-12-22 (×4): qty 1

## 2011-12-22 NOTE — Progress Notes (Addendum)
                    [address]            [place] [address]          [phone]     5 Days Post-Op Procedure(s) (LRB): VIDEO ASSISTED THORACOSCOPY (VATS)/WEDGE RESECTION (Left) LOBECTOMY (Left)  Subjective: Walked in hall earlier and sats dropped. Mild SOB, but not as bad as it has been.  Very little cough. C/O constipation.  Objective: Vital signs in last 24 hours: Patient Vitals for the past 24 hrs:  BP Temp Temp src Pulse Resp SpO2  12/22/11 0728 111/75 mmHg 98.2 F (36.8 C) Oral 88  20  93 %  12/22/11 0510 - - - 119  38  80 %  12/22/11 0400 - - - 94  18  94 %  12/22/11 0300 95/69 mmHg 98 F (36.7 C) Oral 92  15  93 %  12/22/11 0000 - - - 82  15  96 %  12/21/11 2300 128/82 mmHg 98.8 F (37.1 C) Oral 88  21  92 %  12/21/11 2200 - - - 86  11  94 %  12/21/11 2000 129/71 mmHg 98.5 F (36.9 C) Oral 92  23  92 %  12/21/11 1600 111/81 mmHg 97.8 F (36.6 C) Oral 80  - 92 %  12/21/11 1200 115/71 mmHg 98 F (36.7 C) Oral - - -  12/21/11 0935 111/73 mmHg - - - - -  12/21/11 0847 - - - - - 90 %   Current Weight  12/17/11 62.8 kg (138 lb 7.2 oz)     Intake/Output from previous day: 04/06 0701 - 04/07 0700 In: 1550 [P.O.:1490; I.V.:60] Out: 1500 [Urine:1450; Chest Tube:50]    PHYSICAL EXAM:  Heart: RRR Lungs: slightly decreased BS in bases, L>R, no rales or rhonchi Wound: clean and dry   Lab Results: CBC: Basename 12/20/11 0426  WBC 9.5  HGB 11.6*  HCT 35.1*  PLT 214   BMET:  Basename 12/20/11 0426  NA 135  K 4.4  CL 103  CO2 26  GLUCOSE 96  BUN 4*  CREATININE 0.48*  CALCIUM 8.4    PT/INR: No results found for this basename: LABPROT,INR in the last 72 hours  CXR: stable R infiltrate, no obvious L ptx  Assessment/Plan: S/P Procedure(s) (LRB): VIDEO ASSISTED THORACOSCOPY (VATS)/WEDGE RESECTION (Left) LOBECTOMY (Left) Avelox D#3 - ?change from IV to po and d/c central line.  Still with increased O2 requirements.  May  ultimately need home O2- will order. LOC today. Possibly home ?Tuesday/Wednesday.   LOS: 5 days    COLLINS,GINA H 12/22/2011  Patient seen and examined. She doesn't feel as well today. She is a little volume overloaded, but orthostatic so may be intravascularly on the dry side. Not comfortable trying to diurese I do think it's reasonable to change to PO Avelox

## 2011-12-23 ENCOUNTER — Inpatient Hospital Stay (HOSPITAL_COMMUNITY): Payer: BC Managed Care – PPO

## 2011-12-23 MED ORDER — SODIUM CHLORIDE 0.9 % IJ SOLN
INTRAMUSCULAR | Status: AC
  Administered 2011-12-23: 20:00:00
  Filled 2011-12-23: qty 20

## 2011-12-23 MED ORDER — SODIUM CHLORIDE 0.9 % IJ SOLN
10.0000 mL | INTRAMUSCULAR | Status: DC | PRN
  Filled 2011-12-23: qty 20
  Filled 2011-12-23: qty 10

## 2011-12-23 MED ORDER — SODIUM CHLORIDE 0.9 % IJ SOLN
10.0000 mL | Freq: Two times a day (BID) | INTRAMUSCULAR | Status: DC
  Administered 2011-12-24: 20 mL
  Administered 2011-12-24: 10 mL
  Filled 2011-12-23: qty 20

## 2011-12-23 NOTE — Progress Notes (Addendum)
[address]            [place] [address]          [phone]     6 Days Post-Op  Procedure(s) (LRB): VIDEO ASSISTED THORACOSCOPY (VATS)/WEDGE RESECTION (Left) LOBECTOMY (Left) Subjective: She feels more SOB and weaker today  Objective  Telemetry sinus tachy  Temp:  [97.6 F (36.4 C)-98.4 F (36.9 C)] 98.4 F (36.9 C) (04/08 0800) Pulse Rate:  [79-99] 79  (04/08 0300) Resp:  [13-26] 13  (04/08 0000) BP: (108-134)/(73-87) 117/77 mmHg (04/08 0800) SpO2:  [92 %-96 %] 94 % (04/08 0805)   Intake/Output Summary (Last 24 hours) at 12/23/11 0926 Last data filed at 12/23/11 0800  Gross per 24 hour  Intake    720 ml  Output    400 ml  Net    320 ml       General appearance: alert, cooperative and no distress Heart: regular rate and rhythm and tachy Lungs: diminished in bases Abdomen: soft nontender, mild distension Extremities: extremities normal, atraumatic, no cyanosis or edema Wound: incisions healing well  Lab Results: No results found for this basename: NA:2,K:2,CL:2,CO2:2,GLUCOSE:2,BUN:2,CREATININE:2,CALCIUM:2,MG:2,PHOS:2 in the last 72 hours No results found for this basename: AST:2,ALT:2,ALKPHOS:2,BILITOT:2,PROT:2,ALBUMIN:2 in the last 72 hours No results found for this basename: LIPASE:2,AMYLASE:2 in the last 72 hours No results found for this basename: WBC:2,NEUTROABS:2,HGB:2,HCT:2,MCV:2,PLT:2 in the last 72 hours No results found for this basename: CKTOTAL:4,CKMB:4,TROPONINI:4 in the last 72 hours No components found with this basename: POCBNP:3 No results found for this basename: DDIMER in the last 72 hours No results found for this basename: HGBA1C in the last 72 hours No results found for this basename: CHOL,HDL,LDLCALC,TRIG,CHOLHDL in the last 72 hours No results found for this basename: TSH,T4TOTAL,FREET3,T3FREE,THYROIDAB in the last 72 hours No results found for this basename:  VITAMINB12,FOLATE,FERRITIN,TIBC,IRON,RETICCTPCT in the last 72 hours  Medications: Scheduled    . amLODipine  10 mg Oral Daily  . bisacodyl  10 mg Oral Daily  . enoxaparin (LOVENOX) injection  40 mg Subcutaneous Q24H  . guaiFENesin  600 mg Oral BID  . moxifloxacin  400 mg Oral q1800  . tiotropium  18 mcg Inhalation Daily  . DISCONTD: moxifloxacin  400 mg Intravenous Q24H     Radiology/Studies:  Dg Chest 2 View  12/23/2011  *RADIOLOGY REPORT*  Clinical Data: Right-sided pneumonia, left-sided lung cancer  CHEST - 2 VIEW  Comparison: 12/22/2011; 12/21/2011; 12/20/2011  Findings: Grossly unchanged cardiac silhouette and mediastinal contours with postsurgical change of the left hilum.  Stable positioning of support apparatus.  Stable position of support apparatus.  Improved aeration of the right upper and mid lung with persistent minimal increased interstitial opacities.  There is unchanged mild elevation of the left hemidiaphragm.  Grossly unchanged small bilateral effusions.  The lungs remain hyperinflated.  Peculiar lucency overlying the left upper lobe is seen on both the PA and lateral radiographs.  Accentuated thoracic kyphosis without osseous abnormality.  IMPRESSION: 1.  Peculiar lucency overlying the left upper lung seen on both the PA and lateral radiographs may represent a loculated hydropneumothorax.  Continued attention on follow-up is recommended.  2.  Unchanged small bilateral effusions and basilar opacities, possibly atelectasis. 3.  Improved aeration of the right mid and upper lung with minimal residual interstitial opacities. Continued attention on follow-up is recommended.  This was made a call report.  Original Report Authenticated By:  Waynard Reeds, M.D.   Dg Chest Port 1 View  12/22/2011  *RADIOLOGY REPORT*  Clinical Data: Status post left upper lobectomy for lung cancer.  PORTABLE CHEST - 1 VIEW  Comparison: Two separate films on 12/21/2011.  Findings: No evidence of  pneumothorax.  Stable central line positioning.  Patchy areas of atelectasis and airspace disease remains bilaterally.  No significant pleural effusions.  IMPRESSION: No pneumothorax.  Stable patchy areas of bilateral atelectasis and airspace disease.  Original Report Authenticated By: Reola Calkins, M.D.   Dg Chest Port 1 View  12/21/2011  *RADIOLOGY REPORT*  Clinical Data: 61 year old female status post chest tube removal.  PORTABLE CHEST - 1 VIEW  Comparison: 0612 hours the same day and earlier.  Findings: AP portable upright view 1202 hours.  Left chest tube has been removed.  Decreased subcutaneous emphysema along the chest wall.  No pneumothorax.  Continued low lung volumes.  Patchy right upper lobe and lower lobe opacity has not significantly changed. Stable cardiac size and mediastinal contours.  Stable right IJ central line.  IMPRESSION: 1.  Left chest tube removed.  No pneumothorax. 2.  Continued low lung volumes with multilobar right lung airspace opacity.  Original Report Authenticated By: Harley Hallmark, M.D.    INR: Will add last result for INR, ABG once components are confirmed Will add last 4 CBG results once components are confirmed  Assessment/Plan: S/P Procedure(s) (LRB): VIDEO ASSISTED THORACOSCOPY (VATS)/WEDGE RESECTION (Left) LOBECTOMY (Left)  1. Push rehab and pulmonary toilet as able, may require home O2 at least short term 2. ? Small hydropntx on cxr, follow clinically 3. Recheck labs in am  LOS: 6 days    GOLD,WAYNE E 4/8/20139:26 AM     Chart reviewed, patient examined, agree with above.  She says she feels a little more short of breath than she did a few days ago.  Still on oxygen.  750 on IS. CXR shows a new loculated hydropneumothorax in antero-superior left chest.  This could be a residual space but will followup tomorrow with cxr.

## 2011-12-23 NOTE — Progress Notes (Signed)
   CARE MANAGEMENT NOTE 12/23/2011  Patient:  Valerie Mosley, Valerie Mosley   Account Number:  [id number]  Date Initiated:  12/18/2011  Documentation initiated by:  [facility]  Subjective/Objective Assessment:   Thoracotomy -  spouse     Action/Plan:   PTA, PT INDEPENDENT, LIVES WITH SPOUSE.   Anticipated DC Date:  12/24/2011   Anticipated DC Plan:  HOME W HOME HEALTH SERVICES      DC Planning Services  CM consult      Choice offered to / List presented to:     DME arranged  [facility]      DME agency  [facility]        Status of service:  Completed, signed off Medicare Important Message given?   (If response is "NO", the following Medicare IM given date fields will be blank) Date Medicare IM given:   Date Additional Medicare IM given:    Discharge Disposition:  HOME W HOME HEALTH SERVICES  Per UR Regulation:  Reviewed for med. necessity/level of care/duration of stay  If discussed at Long Length of Stay Meetings, dates discussed:    Comments:  12/23/11 Valerie Wortmann,RN,BSN 1630 PT FOR DISCHARGE TOMORROW.  WILL NEED HOME O2 @4L /[place]. SPOKE WITH PT REGARDING HOME O2 SET UP.  REFERRAL TO [facility] FOR DME NEEDS.  PORTABLE O2 TANK TO BE DELIVERED TO PT'S ROOM PRIOR TO DC HOME. Phone #[phone]

## 2011-12-23 NOTE — Progress Notes (Signed)
Radiology called in regards to patients CXR from this am, called and spoke to [facility], [place] about results, no knew orders at this time.ill continue to monitor patient.

## 2011-12-24 ENCOUNTER — Inpatient Hospital Stay (HOSPITAL_COMMUNITY): Payer: BC Managed Care – PPO

## 2011-12-24 LAB — BASIC METABOLIC PANEL
BUN: 11 mg/dL (ref 6–23)
CO2: 26 mEq/L (ref 19–32)
Calcium: 8.7 mg/dL (ref 8.4–10.5)
Chloride: 102 mEq/L (ref 96–112)
Creatinine, Ser: 0.61 mg/dL (ref 0.50–1.10)
GFR calc Af Amer: >90 mL/min (ref >90)
GFR calc non Af Amer: >90 mL/min (ref >90)
Glucose, Bld: 89 mg/dL (ref 70–99)
Potassium: 3.8 mEq/L (ref 3.5–5.1)
Sodium: 138 mEq/L (ref 135–145)

## 2011-12-24 LAB — CBC
HCT: 37.4 % (ref 36.0–46.0)
Hemoglobin: 12.4 g/dL (ref 12.0–15.0)
MCH: 30.2 pg (ref 26.0–34.0)
MCHC: 33.2 g/dL (ref 30.0–36.0)
MCV: 91 fL (ref 78.0–100.0)
Platelets: 308 10*3/uL (ref 150–400)
RBC: 4.11 MIL/uL (ref 3.87–5.11)
RDW: 13.5 % (ref 11.5–15.5)
WBC: 5.3 10*3/uL (ref 4.0–10.5)

## 2011-12-24 MED ORDER — LUNG SURGERY BOOK
Freq: Once | Status: AC
  Administered 2011-12-24: 09:00:00
  Filled 2011-12-24: qty 1

## 2011-12-24 MED ORDER — GUAIFENESIN ER 600 MG PO TB12: 600.0000 mg | ORAL_TABLET | Freq: Two times a day (BID) | ORAL | Status: DC

## 2011-12-24 MED ORDER — OXYCODONE-ACETAMINOPHEN 5-325 MG PO TABS: 1.0000 | ORAL_TABLET | ORAL | Status: AC | PRN

## 2011-12-24 MED ORDER — MOXIFLOXACIN HCL 400 MG PO TABS: 400.0000 mg | ORAL_TABLET | Freq: Every day | ORAL | Status: AC

## 2011-12-24 NOTE — Progress Notes (Addendum)
                    [address]            [place] [address]          [phone]     7 Days Post-Op Procedure(s) (LRB): VIDEO ASSISTED THORACOSCOPY (VATS)/WEDGE RESECTION (Left) LOBECTOMY (Left)  Subjective: Still requiring 3L O2, but feels less SOB with exertion this am. Nausea improved, but appetite still poor.  Objective: Vital signs in last 24 hours: Patient Vitals for the past 24 hrs:  BP Temp Temp src Pulse Resp SpO2  12/24/11 0749 113/79 mmHg 97.6 F (36.4 C) Oral - - -  12/24/11 0345 110/71 mmHg 98.4 F (36.9 C) Oral 89  23  99 %  12/24/11 0000 96/65 mmHg 97.5 F (36.4 C) Oral 86  18  99 %  12/23/11 1915 107/75 mmHg 98.6 F (37 C) Oral 95  27  96 %  12/23/11 1700 - - - - - 85 %  12/23/11 1600 - 98.7 F (37.1 C) Oral - - -  12/23/11 1200 107/79 mmHg 98.8 F (37.1 C) Oral - - -  12/23/11 1040 117/77 mmHg - - - - -   Current Weight  12/17/11 62.8 kg (138 lb 7.2 oz)     Intake/Output from previous day: 04/08 0701 - 04/09 0700 In: 860 [P.O.:840; I.V.:20] Out: 250 [Urine:250]    PHYSICAL EXAM:  Heart: RRR Lungs: decreased BS on L Wound: clean and dry   Lab Results: CBC: Basename 12/24/11 0355  WBC 5.3  HGB 12.4  HCT 37.4  PLT 308   BMET:  Basename 12/24/11 0355  NA 138  K 3.8  CL 102  CO2 26  GLUCOSE 89  BUN 11  CREATININE 0.61  CALCIUM 8.7    CXR: stable L apical space   Assessment/Plan: S/P Procedure(s) (LRB): VIDEO ASSISTED THORACOSCOPY (VATS)/WEDGE RESECTION (Left) LOBECTOMY (Left) Pulm- O2 being weaned, now on 3L.  Continue pulm toilet.  Will need home O2 (ordered).  CXR stable-reviewed with Dr. Donata Clay. Possibly home 1-2 days with O2 if she continues to improve and can get down to 2L.   LOS: 7 days    COLLINS,GINA H 12/24/2011   I have seen and examined the patient and agree with the assessment and plan as outlined.  CXR looks good.  Hazel Wrinkle H 12/24/2011 8:46 PM

## 2011-12-24 NOTE — Discharge Summary (Signed)
[address]            [place] [address]          [phone]         Discharge Summary  Name: Valerie Mosley DOB: [birth year] 61 y.o. MRN: [id number]  Admission Date: 12/17/2011 Discharge Date:    Admitting Diagnosis: Left upper lobe nodule   Discharge Diagnosis:  Invasive well differentiated adenocarcinoma (T1a, N0) Hypertension COPD Kyphoscoliosis Tobacco abuse   Procedures: Procedure(s): LEFT VIDEO ASSISTED THORACOSCOPY (VATS)/LEFT UPPER LOBECTOMY, LYMPH NODE DISSECTION on 12/17/2011   HPI:  The patient is a 61 y.o. female referred to Dr. Dorris Fetch by Dr. Delton Coombes regarding a left upper lobe nodule. She is a smoker, with an 80 pack year history. She was in her usual state of good health until November of 2012 when she developed hemoptysis. She was diagnosed with pneumonia and was hospitalized and in the ICU for several days. CT scan at that time showed a right upper lobe pneumonia, but also a left upper lobe nodule. She had a repeat CT in December which showed clearing of the right lung process but persistence of the lung nodule. A PET CT was done in February which showed possible slight interval enlargement of the nodule, there was mild hypermetabolic activity noted in the vicinity of the nodule. Dr. Dorris Fetch recommended surgical resection at this time. All risks, benefits and alternatives of surgery were explained in detail, and the patient agreed to proceed.   Hospital Course:  The patient was admitted to [facility] on 12/17/2011. The patient was taken to the operating room and underwent the above procedure.    The postoperative course has been complicated by pulmonary issues. She has remained oxygen dependent, although at present she has been weaned down to 3 L. She developed a productive cough with a questionable right lung infiltrate on chest x-ray. She was started empirically on Avelox and has currently completed a 5 day course of  antibiotics. Her cough has improved and she is less short of breath. She is being treated with aggressive pulmonary toilet measures and incentive spirometry.  Her surgical standpoint, all her chest tubes have been discontinued. Her followup chest x-rays had initially remained stable with no obvious pneumothorax. However, on 12/23/2011 she developedwhat appeared to be a left hydropneumothorax. This remained stable on followup films and is felt to be likely a post- surgical space. We will repeat a PA and lateral chest x-ray on 12/25/2011 for comparison.   She has remained afebrile and her vital signs are stable. She is tolerating a regular diet. She's ambulating the halls without difficulty. Home oxygen has been arranged. We anticipate discharge home within the next 24-48 hours provided the patient remained stable. Her final pathology was positive for invasive well-differentiated adenocarcinoma.  Recent vital signs:  Filed Vitals:   12/24/11 0749  BP: 113/79  Pulse:   Temp: 97.6 F (36.4 C)  Resp:     Recent laboratory studies:  CBC: Basename 12/24/11 0355  WBC 5.3  HGB 12.4  HCT 37.4  PLT 308   BMET:  Basename 12/24/11 0355  NA 138  K 3.8  CL 102  CO2 26  GLUCOSE 89  BUN 11  CREATININE 0.61  CALCIUM 8.7    PT/INR: No results found for this basename: LABPROT,INR in the last 72 hours  Discharge Medications:   Medication List  As  of 12/24/2011  9:19 AM   STOP taking these medications         HYDROcodone-acetaminophen 5-500 MG per tablet         TAKE these medications         acetaminophen 500 MG tablet   Commonly known as: TYLENOL   Take 500 mg by mouth every 6 (six) hours as needed. For pain or fever      amLODipine 10 MG tablet   Commonly known as: NORVASC   Take 10 mg by mouth daily.      guaiFENesin 600 MG 12 hr tablet   Commonly known as: MUCINEX   Take 1 tablet (600 mg total) by mouth 2 (two) times daily.      loratadine 10 MG tablet   Commonly known as:  CLARITIN   Take 10 mg by mouth daily as needed. For allergies      moxifloxacin 400 MG tablet   Commonly known as: AVELOX   Take 1 tablet (400 mg total) by mouth daily at 6 PM.      oxyCODONE-acetaminophen 5-325 MG per tablet   Commonly known as: PERCOCET   Take 1-2 tablets by mouth every 4 (four) hours as needed for pain.      penicillin v potassium 500 MG tablet   Commonly known as: VEETID   Take 500 mg by mouth 3 (three) times daily as needed. For when she has a dental procedure done.      tiotropium 18 MCG inhalation capsule   Commonly known as: SPIRIVA   Place 1 capsule (18 mcg total) into inhaler and inhale daily.      VENTOLIN HFA 108 (90 BASE) MCG/ACT inhaler   Generic drug: albuterol   Inhale 2 puffs into the lungs every 6 (six) hours as needed. For shortness of breath            Discharge Instructions:  The patient is to refrain from driving, heavy lifting or strenuous activity.  May shower daily and clean incisions with soap and water.  May resume regular diet.  Discharge Orders    Future Appointments: Provider: Department: Dept Phone: Center:   01/08/2012 1:45 PM Loreli Slot, MD [facility] [phone] [facility]      Follow-up Information    Follow up with Loreli Slot, MD on 01/08/2012. (Have a chest x-ray at 12:45, then see MD at 1:45)    Contact information:   [address] [place] [address] [phone]       Follow up with Leslye Peer., MD. (As directed)    Contact information:   [address] [employer] [place] [address] [phone]           COLLINS,GINA H 12/24/2011, 9:19 AM

## 2011-12-24 NOTE — Progress Notes (Signed)
SATURATION QUALIFICATIONS:  Patient Saturations on Room Air at Rest = 85%  Patient Saturations on [employer] while Ambulating = 82%  Patient Saturations on 4 Liters of oxygen while Ambulating = 93%  Valerie Mosley, [address]

## 2011-12-24 NOTE — Progress Notes (Signed)
Utilization review completed. Ashden Sonnenberg, RN, BSN. 12/24/11  

## 2011-12-25 ENCOUNTER — Inpatient Hospital Stay (HOSPITAL_COMMUNITY): Payer: BC Managed Care – PPO

## 2011-12-25 NOTE — Progress Notes (Addendum)
                    [address]            [place] [address]          [phone]     8 Days Post-Op Procedure(s) (LRB): VIDEO ASSISTED THORACOSCOPY (VATS)/WEDGE RESECTION (Left) LOBECTOMY (Left)  Subjective: "Feel like I've turned the corner."  Less SOB, appetite better, no nausea.  Down to 2L at rest.  Objective: Vital signs in last 24 hours: Patient Vitals for the past 24 hrs:  BP Temp Temp src Pulse Resp SpO2  12/25/11 0416 104/71 mmHg 97.9 F (36.6 C) Oral 77  - 98 %  12/25/11 0007 128/83 mmHg 98.1 F (36.7 C) Oral 72  15  98 %  12/24/11 1856 95/69 mmHg 99.9 F (37.7 C) Oral 87  22  96 %  12/24/11 1200 99/66 mmHg 98.9 F (37.2 C) Oral - - -  12/24/11 1034 113/79 mmHg - - - - -  12/24/11 0825 - - - - - 96 %  12/24/11 0749 113/79 mmHg 97.6 F (36.4 C) Oral - - -   Current Weight  12/17/11 62.8 kg (138 lb 7.2 oz)     Intake/Output from previous day: 04/09 0701 - 04/10 0700 In: 730 [P.O.:720; I.V.:10] Out: -     PHYSICAL EXAM:  Heart: RRR Lungs: clear, slightly decreased BS on L Wound: clean and dry   Lab Results: CBC: Basename 12/24/11 0355  WBC 5.3  HGB 12.4  HCT 37.4  PLT 308   BMET:  Basename 12/24/11 0355  NA 138  K 3.8  CL 102  CO2 26  GLUCOSE 89  BUN 11  CREATININE 0.61  CALCIUM 8.7    CXR: stable L apical ptx/space  Assessment/Plan: S/P Procedure(s) (LRB): VIDEO ASSISTED THORACOSCOPY (VATS)/WEDGE RESECTION (Left) LOBECTOMY (Left) Pulm status improved, O2 weaned to 2L. Plan d/c home today with home O2.  Instructions reviewed with patient.   LOS: 8 days    COLLINS,GINA H 12/25/2011  Patient seen and examined, agree with above

## 2011-12-25 NOTE — Plan of Care (Signed)
Problem: Phase II Progression Outcomes Goal: Weaning O2 for sats > or equal to 88% Outcome: Not Applicable Date Met:  12/25/11 Pt set up with home oxygen.

## 2011-12-25 NOTE — Progress Notes (Signed)
Pt discharged home with family per MD order. All discharge instructions were reviewed and all questions answered.

## 2012-01-02 ENCOUNTER — Other Ambulatory Visit: Payer: Self-pay | Admitting: Thoracic Surgery (Cardiothoracic Vascular Surgery)

## 2012-01-02 DIAGNOSIS — D381 Neoplasm of uncertain behavior of trachea, bronchus and lung: Secondary | ICD-10-CM

## 2012-01-08 ENCOUNTER — Ambulatory Visit (INDEPENDENT_AMBULATORY_CARE_PROVIDER_SITE_OTHER): Payer: Self-pay | Admitting: Thoracic Surgery (Cardiothoracic Vascular Surgery)

## 2012-01-08 ENCOUNTER — Encounter: Payer: Self-pay | Admitting: Thoracic Surgery (Cardiothoracic Vascular Surgery)

## 2012-01-08 ENCOUNTER — Ambulatory Visit
Admission: RE | Admit: 2012-01-08 | Discharge: 2012-01-08 | Disposition: A | Discharge status: Home / Self Care | Payer: BC Managed Care – PPO | Source: Ambulatory Visit | Attending: Thoracic Surgery (Cardiothoracic Vascular Surgery) | Admitting: Thoracic Surgery (Cardiothoracic Vascular Surgery)

## 2012-01-08 VITALS — BP 114/83 | HR 98 | Resp 20 | Ht 65.5 in | Wt 128.0 lb

## 2012-01-08 DIAGNOSIS — D381 Neoplasm of uncertain behavior of trachea, bronchus and lung: Secondary | ICD-10-CM

## 2012-01-08 DIAGNOSIS — Z9889 Other specified postprocedural states: Secondary | ICD-10-CM

## 2012-01-08 DIAGNOSIS — C3490 Malignant neoplasm of unspecified part of unspecified bronchus or lung: Secondary | ICD-10-CM | POA: Insufficient documentation

## 2012-01-08 DIAGNOSIS — Z902 Acquired absence of lung [part of]: Secondary | ICD-10-CM

## 2012-01-08 NOTE — Progress Notes (Signed)
HPI:  Mrs. Vidovich returns for a scheduled followup visit. She underwent a wedge resection followed by a left upper lobectomy on 12/17/11 for what turned out to be a stage IA non-small cell carcinoma. Several missing have any major calculated to go home on 3 L nasal cannula oxygen because of persistent that the saturations. She says that since she's been on having trouble with her breathing she is down to 2 L nasal cannula. She's not had any shortness of breath or severe wheezing episodes. Overall she feels well. She does complain of some numbness in the left anterior chest wall.  Past Medical History  Diagnosis Date  . Hypertension   . Kyphoscoliosis   . COPD (chronic obstructive pulmonary disease)   . Cough 07/29/11    started coughing up blood this am  . Cancer     lung nodule  . PONV (postoperative nausea and vomiting)   . Recurrent upper respiratory infection (URI)   . Cough       Current Outpatient Prescriptions  Medication Sig Dispense Refill  . acetaminophen (TYLENOL) 500 MG tablet Take 500 mg by mouth every 6 (six) hours as needed. For pain or fever      . albuterol (VENTOLIN HFA) 108 (90 BASE) MCG/ACT inhaler Inhale 2 puffs into the lungs every 6 (six) hours as needed. For shortness of breath      . amLODipine (NORVASC) 10 MG tablet Take 10 mg by mouth daily.        Marland Kitchen guaiFENesin (MUCINEX) 600 MG 12 hr tablet Take 1 tablet (600 mg total) by mouth 2 (two) times daily.      Marland Kitchen loratadine (CLARITIN) 10 MG tablet Take 10 mg by mouth daily as needed. For allergies      . penicillin v potassium (VEETID) 500 MG tablet Take 500 mg by mouth 3 (three) times daily as needed. For when she has a dental procedure done.      . tiotropium (SPIRIVA HANDIHALER) 18 MCG inhalation capsule Place 1 capsule (18 mcg total) into inhaler and inhale daily.  30 capsule  11  . oxyCODONE-acetaminophen (PERCOCET) 5-325 MG per tablet 1 tablet every 4 (four) hours as needed.         Physical Exam BP 114/83   Pulse 98  Resp 20  Ht 5' 5.5" (1.664 m)  Wt 128 lb (58.06 kg)  BMI 20.98 kg/m2  SpO2 95% Gen. well-appearing 61 year old woman in no acute distress Lungs diminished at left base, mild rhonchi bilaterally Incisions well-healed Cardiac regular rate and rhythm  Diagnostic Tests: Chest x-ray shows postoperative changes with no evidence recurrent disease  Impression: Doing well at this point in time. She's about 3 weeks postop. She's having minimal discomfort and is only taking a pain pill night before she goes to sleep. Her respiratory status has been stable. She is anxious to go back to work and resume full activities.  I advised her to wait until May 2 to return to work, and to start with half days for the first 2 weeks prior to returning full time. She's not to lift anything weighing more than 10 pounds until May 2 as well. She may begin driving, appropriate precautions were discussed.  I did advise her to make an appointment to see Dr. Delton Coombes sometime in the near future to evaluate her respiratory status.  As she had stage IA disease there is no indication for any adjuvant chemotherapy or radiation at this point in time. She did have a staging  PET scan. She has no neurologic symptoms or findings, so we will not do a MRI of the head at this time per the STS guidelines.  Plan: Return in 2 months with a chest x-ray for her three-month postoperative visit

## 2012-01-09 ENCOUNTER — Ambulatory Visit (INDEPENDENT_AMBULATORY_CARE_PROVIDER_SITE_OTHER): Payer: BC Managed Care – PPO | Admitting: Emergency Medicine

## 2012-01-09 ENCOUNTER — Encounter: Payer: Self-pay | Admitting: Emergency Medicine

## 2012-01-09 VITALS — BP 98/78 | HR 99 | Temp 98.3°F | Ht 66.0 in | Wt 127.4 lb

## 2012-01-09 DIAGNOSIS — J449 Chronic obstructive pulmonary disease, unspecified: Secondary | ICD-10-CM

## 2012-01-09 DIAGNOSIS — F17200 Nicotine dependence, unspecified, uncomplicated: Secondary | ICD-10-CM

## 2012-01-09 DIAGNOSIS — Z720 Tobacco use: Secondary | ICD-10-CM

## 2012-01-09 NOTE — Progress Notes (Signed)
Subjective:    Patient ID: Valerie Mosley, female    DOB: March 11, 1951, 61 y.o.   MRN: [id number]  HPI 61 yo WF , active smoker,  seen for initial pulmonary consult during hospital stay for hemoptysis 07/29/11 >tx for presumed CAP   08/13/2011 Post Hospital  Admitted 11/12-11/19/12 for hemoptysis with underling presumed COPD and supsected CAP. CT chest showed 1. 1.1 cm left upper lobe mass, Patchy density in the right upper lobe and minimal patchy density in the right lower lobe  She underwent  FOB from 11/13, all blood was on the R (small apical nodule is on the left). No endobronchial lesion noted but RML and RLL were occluded w clot - path, bx's confirm no malignancy. Tx for  pneumonia (no organism specified) with clinical improvement with abx, discharged on Avelox .   She has a known hx of  left upper lobe nodule with prev. FOB  that was neg for per pt.    Since discharge she is feeling much better with decreased cough and congesiton . No further hemopytsis. She is trying to cut back on smoking.   Hosp F/U visit 08/30/11 -- 61 yo smoker with probable COPD, admitted 07/29/11 with hemoptysis and AE-COPD vs PNA. At CT scan showed a LUL 1.1 cm spiculated nodule. She was rx w abx and for AE COPD. No longer having any hemoptysis. CXR 11/29 showed improvement in RLL and RML PNA. Returns now for f/u.  She is down to 3 -4 cig a day. She has some exertional SOB, but not severe. She uses SABA 2x a week.   ROV 63/3/13 -- COPD/tobacco use, recent hosp for hemoptysis as above, LUL nodule on CT scan. Returns today after PFT, had repeat CT on 12/17 that shows stable spiculated LUL nodule (? Smaller). Cigarettes at 4 cig a day. Rare SABA use.   ROV 63/12/13 -- COPD, continued tobacco, LUL nodule (last CT 09/02/11). Breathing better since Spiriva started last time. Still smoking. Need to discuss CT and options for f/u - serial scans vs possible primary resection.   PULMONARY FUNCTON TEST 09/19/2011  Peak Flow 130   FVC 2.87  FEV1 1.94  FEV1/FVC 67.6  FVC  % Predicted 88  FEV % Predicted 81  FeF 25-75 1.04  FeF 25-75 % Predicted 2.65    ROV 63/25/13 --  COPD, just underwent LUL lobectomy for adeno CA, negative nodes. QUIT SMOKING!  Wearing O2, need to qualify. Has been tolerating Spiriva. Has some DOE when she hurries. Has not needed Ventolin.    Objective:   Physical Exam GEN: A/Ox3; pleasant , NAD, well nourished   HEENT:  [place]/AT,  EACs-clear, TMs-wnl, NOSE-clear, THROAT-clear, no lesions, no postnasal drip or exudate noted.   NECK:  Supple w/ fair ROM; no JVD; normal carotid impulses w/o bruits; no thyromegaly or nodules palpated; no lymphadenopathy.  RESP  Clear  P & A; w/o, wheezes/ rales/ or rhonchi.no accessory muscle use, no dullness to percussion  CARD:  RRR, no m/r/g  , no peripheral edema, pulses intact, no cyanosis or clubbing.  GI:   Soft & nt; nml bowel sounds; no organomegaly or masses detected.  Musco: Warm bil, no deformities or joint swelling noted.   Neuro: alert, no focal deficits noted.    Skin: Warm, no lesions or rashes   CT scan 09/02/11:  IMPRESSION:  1. Interval clearing of right upper lobe pneumonia.  2. Left upper lobe nodule is unchanged in the short interval from  07/29/2011.  Follow-up could be performed in 3 months to ensure  continued stability. This recommendation follows the consensus  statement: Guidelines for Management of Small Pulmonary Nodules  Detected on CT Scans: A Statement from the Fleischner Society as  published in Radiology 2005; 237:395-400. Available online at:  DietDisorder.cz.  3. Additional scattered pulmonary nodules can be reevaluated on  future imaging as well.      Assessment & Plan:   Tobacco abuse Quit in 3/13  COPD (chronic obstructive pulmonary disease) - walking oximetry today - spiriva + SABA

## 2012-01-09 NOTE — Patient Instructions (Addendum)
Please continue your Spiriva Walking oximetry today shows you do not need oxygen any more.  Your CXR is stable.  Follow with Dr Delton Coombes in 4 months or sooner if you have any problems.

## 2012-01-09 NOTE — Assessment & Plan Note (Signed)
-   walking oximetry today - spiriva + SABA

## 2012-01-09 NOTE — Assessment & Plan Note (Signed)
Quit in 3/13

## 2012-01-15 ENCOUNTER — Other Ambulatory Visit: Payer: Self-pay | Admitting: *Deleted

## 2012-01-15 DIAGNOSIS — G8918 Other acute postprocedural pain: Secondary | ICD-10-CM

## 2012-01-15 MED ORDER — HYDROCODONE-ACETAMINOPHEN 7.5-500 MG PO TABS: 1.0000 | ORAL_TABLET | Freq: Four times a day (QID) | ORAL | Status: AC | PRN

## 2012-03-10 ENCOUNTER — Ambulatory Visit: Payer: BC Managed Care – PPO | Admitting: Thoracic Surgery (Cardiothoracic Vascular Surgery)

## 2012-03-13 ENCOUNTER — Other Ambulatory Visit: Payer: Self-pay | Admitting: Thoracic Surgery (Cardiothoracic Vascular Surgery)

## 2012-03-13 DIAGNOSIS — C3490 Malignant neoplasm of unspecified part of unspecified bronchus or lung: Secondary | ICD-10-CM

## 2012-03-24 ENCOUNTER — Encounter: Payer: Self-pay | Admitting: Thoracic Surgery (Cardiothoracic Vascular Surgery)

## 2012-03-24 ENCOUNTER — Ambulatory Visit
Admission: RE | Admit: 2012-03-24 | Discharge: 2012-03-24 | Disposition: A | Discharge status: Home / Self Care | Payer: BC Managed Care – PPO | Source: Ambulatory Visit | Attending: Thoracic Surgery (Cardiothoracic Vascular Surgery) | Admitting: Thoracic Surgery (Cardiothoracic Vascular Surgery)

## 2012-03-24 ENCOUNTER — Ambulatory Visit (INDEPENDENT_AMBULATORY_CARE_PROVIDER_SITE_OTHER): Payer: BC Managed Care – PPO | Admitting: Thoracic Surgery (Cardiothoracic Vascular Surgery)

## 2012-03-24 VITALS — BP 108/76 | HR 96 | Resp 18 | Ht 65.5 in | Wt 127.0 lb

## 2012-03-24 DIAGNOSIS — Z902 Acquired absence of lung [part of]: Secondary | ICD-10-CM

## 2012-03-24 DIAGNOSIS — Z9889 Other specified postprocedural states: Secondary | ICD-10-CM

## 2012-03-24 DIAGNOSIS — C3490 Malignant neoplasm of unspecified part of unspecified bronchus or lung: Secondary | ICD-10-CM

## 2012-03-24 NOTE — Progress Notes (Signed)
  HPI:  Valerie Mosley returns today for a scheduled 3 month followup visit. She had a left upper lobectomy for stage IA non-small cell carcinoma on 12/17/11. She was last in the office in may which time she was doing well. She states that the she is still having some numbness and pain in her left costal margin region. She does get out of breath when she exerts herself, particularly with bending over. She somewhat frustrated, because she wishes she could work in her garden and ride her horses, but does not feel that she is up to that just yet. She has not had any cough or hemoptysis. No shortness of breath at rest. Weight has been stable.  Past Medical History  Diagnosis Date  . Hypertension   . Kyphoscoliosis   . COPD (chronic obstructive pulmonary disease)   . Cough 07/29/11    started coughing up blood this am  . Cancer     lung nodule  . PONV (postoperative nausea and vomiting)   . Recurrent upper respiratory infection (URI)   . Cough       Current Outpatient Prescriptions  Medication Sig Dispense Refill  . acetaminophen (TYLENOL) 500 MG tablet Take 500 mg by mouth every 6 (six) hours as needed. For pain or fever      . albuterol (VENTOLIN HFA) 108 (90 BASE) MCG/ACT inhaler Inhale 2 puffs into the lungs every 6 (six) hours as needed. For shortness of breath      . amLODipine (NORVASC) 10 MG tablet Take 10 mg by mouth daily.        Marland Kitchen loratadine (CLARITIN) 10 MG tablet Take 10 mg by mouth daily as needed. For allergies      . penicillin v potassium (VEETID) 500 MG tablet Take 500 mg by mouth 3 (three) times daily as needed. For when she has a dental procedure done.      . tiotropium (SPIRIVA HANDIHALER) 18 MCG inhalation capsule Place 1 capsule (18 mcg total) into inhaler and inhale daily.  30 capsule  11  . oxyCODONE-acetaminophen (PERCOCET) 5-325 MG per tablet 1 tablet every 4 (four) hours as needed.         Physical Exam BP 108/76  Pulse 96  Resp 18  Ht 5' 5.5" (1.664 m)  Wt 127  lb (57.607 kg)  BMI 20.81 kg/m2  SpO2 96% Gen. well-developed and well-nourished 61 year old woman in no acute distress Lungs diminished breath sounds left base otherwise clear Cardiac regular rate and rhythm with 2/6 systolic murmur left upper sternal border Incisions well-healed No cervical, supraclavicular, or axillary adenopathy  Diagnostic Tests: Chest x-ray shows changes of the left upper lobectomy, no evidence of recurrence, await official radiology opinion.  Impression: 61 year old woman now 3 months out from left upper lobectomy for stage IA non-small cell carcinoma. Overall I think she is doing well at this point in time. She does still have some discomfort it is the case more often than not. She is a little discouraged and the fact that she can do everything she was doing prior surgery. I tried to reassure her that this was normal this early after the procedure, and she should note significant improvements over the next 3-6 months. She is no evidence of recurrent disease at this time.   Plan: I will see her back in 3 months for a 6 month followup visit. We will do a CT of the chest at that time.

## 2012-04-20 ENCOUNTER — Ambulatory Visit (INDEPENDENT_AMBULATORY_CARE_PROVIDER_SITE_OTHER): Payer: BC Managed Care – PPO | Admitting: Emergency Medicine

## 2012-04-20 ENCOUNTER — Encounter: Payer: Self-pay | Admitting: Emergency Medicine

## 2012-04-20 VITALS — BP 102/72 | HR 102 | Temp 98.0°F | Ht 66.0 in | Wt 126.4 lb

## 2012-04-20 DIAGNOSIS — C3490 Malignant neoplasm of unspecified part of unspecified bronchus or lung: Secondary | ICD-10-CM

## 2012-04-20 DIAGNOSIS — J449 Chronic obstructive pulmonary disease, unspecified: Secondary | ICD-10-CM

## 2012-04-20 NOTE — Patient Instructions (Addendum)
Please continue your Spiriva daily Use your Ventolin 2 puffs as needed STOP Smoking altogether. If you are thinking about having a cigarette then find an alternative - sugar free candies are good Start an exercise regimen: start by walking 1 city block 3 times a week. Work your way up to a mile-and-a-half three times a week Follow with Dr Delton Coombes in 4 months or sooner if you have any problems.

## 2012-04-20 NOTE — Progress Notes (Signed)
Subjective:    Patient ID: Valerie Mosley, female    DOB: 13-Feb-1951, 61 y.o.   MRN: [id number] HPI 61 yo WF , active smoker,  seen for initial pulmonary consult during hospital stay for hemoptysis 07/29/11 >tx for presumed CAP   08/13/2011 Post Hospital  Admitted 11/12-11/19/12 for hemoptysis with underling presumed COPD and supsected CAP. CT chest showed 1. 1.1 cm left upper lobe mass, Patchy density in the right upper lobe and minimal patchy density in the right lower lobe  She underwent  FOB from 11/13, all blood was on the R (small apical nodule is on the left). No endobronchial lesion noted but RML and RLL were occluded w clot - path, bx's confirm no malignancy. Tx for  pneumonia (no organism specified) with clinical improvement with abx, discharged on Avelox .   She has a known hx of  left upper lobe nodule with prev. FOB  that was neg for per pt.    Since discharge she is feeling much better with decreased cough and congesiton . No further hemopytsis. She is trying to cut back on smoking.   Hosp F/U visit 08/30/11 -- 61 yo smoker with probable COPD, admitted 07/29/11 with hemoptysis and AE-COPD vs PNA. At CT scan showed a LUL 1.1 cm spiculated nodule. She was rx w abx and for AE COPD. No longer having any hemoptysis. CXR 11/29 showed improvement in RLL and RML PNA. Returns now for f/u.  She is down to 3 -4 cig a day. She has some exertional SOB, but not severe. She uses SABA 2x a week.   ROV 09/19/11 -- COPD/tobacco use, recent hosp for hemoptysis as above, LUL nodule on CT scan. Returns today after PFT, had repeat CT on 12/17 that shows stable spiculated LUL nodule (? Smaller). Cigarettes at 4 cig a day. Rare SABA use.   ROV 10/29/11 -- COPD, continued tobacco, LUL nodule (last CT 09/02/11). Breathing better since Spiriva started last time. Still smoking. Need to discuss CT and options for f/u - serial scans vs possible primary resection.   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 QUIT SMOKING!  Wearing O2, need to qualify. Has been tolerating Spiriva. Has some DOE when she hurries. Has not needed Ventolin.   ROV 04/20/12 -- COPD, s/p LUL lobectomy 12/17/11 (Dr Dorris Fetch) for adeno CA.  She has had a lot of ups and downs emotionally - sounds like depression; has been treated with xanax for some anxiety. She doesn't believe she can do her former activties - breathing and fatigue do influence this. She is on Spiriva, seems to be helping her. Has not needed SABA for over a month. No exacerbations. Her walking oximetry showed that she didn't need O2 anymore (after being d/c post-op). She has had 5 cigarettes since her last visit.    Objective:   Physical Exam Filed Vitals:   04/20/12 1450  BP: 102/72  Pulse: 102  Temp: 98 F (36.7 C)   GEN: A/Ox3; pleasant , NAD, well nourished   HEENT:  [place]/AT,  EACs-clear, TMs-wnl, NOSE-clear, THROAT-clear, no lesions, no postnasal drip or exudate noted.   NECK:  Supple w/ fair ROM; no JVD; normal carotid impulses w/o bruits; no  thyromegaly or nodules palpated; no lymphadenopathy.  RESP  Clear  P & A; w/o, wheezes/ rales/ or rhonchi.no accessory muscle use, no dullness to percussion  CARD:  RRR, no m/r/g  , no peripheral edema, pulses intact, no cyanosis or clubbing.  Musco: Warm bil, no deformities or joint swelling noted.   Neuro: alert, no focal deficits noted.    Skin: Warm, no lesions or rashes   CXR 03/24/12 Clinical Data: Follow-up lung cancer surgery 2 months ago. Ex-  smoker.  CHEST - 2 VIEW  Comparison: 01/08/2012.  Findings: Stable linear scarring in the medial aspect of the left  upper lobe with stable left hilar surgical clips. The left  hemidiaphragm remains elevated. The left heart borders remain   obscured with no gross cardiac enlargement. Diffuse osteopenia and  mild thoracic spine degenerative changes.  IMPRESSION:  1. No acute abnormality.  2. Stable left postsurgical changes and stable changes of COPD.      Assessment & Plan:   Non-small cell lung cancer Following Ct scans per Dr Hendrickson's plans  COPD (chronic obstructive pulmonary disease) Discussed smoking cessation in detail Continue the spiriva + ventolin prn rov 4 months

## 2012-04-20 NOTE — Assessment & Plan Note (Signed)
Following Ct scans per Dr Hendrickson's plans

## 2012-04-20 NOTE — Assessment & Plan Note (Signed)
Discussed smoking cessation in detail Continue the spiriva + ventolin prn rov 4 months

## 2012-05-25 ENCOUNTER — Encounter: Payer: Self-pay | Admitting: Cardiovascular Disease

## 2012-06-03 ENCOUNTER — Other Ambulatory Visit: Payer: Self-pay | Admitting: Thoracic Surgery (Cardiothoracic Vascular Surgery)

## 2012-06-03 DIAGNOSIS — C3490 Malignant neoplasm of unspecified part of unspecified bronchus or lung: Secondary | ICD-10-CM

## 2012-06-23 ENCOUNTER — Encounter: Payer: Self-pay | Admitting: Thoracic Surgery (Cardiothoracic Vascular Surgery)

## 2012-06-23 ENCOUNTER — Ambulatory Visit (INDEPENDENT_AMBULATORY_CARE_PROVIDER_SITE_OTHER): Payer: BC Managed Care – PPO | Admitting: Thoracic Surgery (Cardiothoracic Vascular Surgery)

## 2012-06-23 ENCOUNTER — Ambulatory Visit
Admission: RE | Admit: 2012-06-23 | Discharge: 2012-06-23 | Disposition: A | Discharge status: Home / Self Care | Payer: BC Managed Care – PPO | Source: Ambulatory Visit | Attending: Thoracic Surgery (Cardiothoracic Vascular Surgery) | Admitting: Thoracic Surgery (Cardiothoracic Vascular Surgery)

## 2012-06-23 VITALS — BP 135/90 | HR 93 | Resp 16 | Ht 65.5 in | Wt 128.0 lb

## 2012-06-23 DIAGNOSIS — C3410 Malignant neoplasm of upper lobe, unspecified bronchus or lung: Secondary | ICD-10-CM

## 2012-06-23 DIAGNOSIS — C3490 Malignant neoplasm of unspecified part of unspecified bronchus or lung: Secondary | ICD-10-CM

## 2012-06-23 DIAGNOSIS — Z09 Encounter for follow-up examination after completed treatment for conditions other than malignant neoplasm: Secondary | ICD-10-CM

## 2012-06-23 NOTE — Progress Notes (Signed)
HPI:  Mrs. Hrivnak returns today for a 6 month followup visit. She had a left upper lobectomy for stage IA non-small cell carcinoma on 12/17/11. She did not require any adjuvant therapy. I saw in the office 3 months ago which time she was doing well. She was still smoking actively at that time. She says she's been feeling well. Her only complaint is that she has a pain in the center for chest when she wears her bra. She describes this as a tightness. With extensive questioning she says she never had this on weekend when she does not wear a bra, even if she exerts her self. She still has some occasional pains in her incision for which she is taking Tylenol. She says that her breathing is been stable. She says she has only smoked one cigarette in the past 3 weeks.  Past Medical History  Diagnosis Date  . Hypertension   . Kyphoscoliosis   . COPD (chronic obstructive pulmonary disease)   . Cough 07/29/11    started coughing up blood this am  . Cancer     lung nodule  . PONV (postoperative nausea and vomiting)   . Recurrent upper respiratory infection (URI)   . Cough      Current Outpatient Prescriptions  Medication Sig Dispense Refill  . acetaminophen (TYLENOL) 500 MG tablet Take 500 mg by mouth every 6 (six) hours as needed. For pain or fever      . albuterol (VENTOLIN HFA) 108 (90 BASE) MCG/ACT inhaler Inhale 2 puffs into the lungs every 6 (six) hours as needed. For shortness of breath      . amLODipine (NORVASC) 10 MG tablet Take 10 mg by mouth daily.        Marland Kitchen loratadine (CLARITIN) 10 MG tablet Take 10 mg by mouth daily as needed. For allergies      . oxyCODONE-acetaminophen (PERCOCET/ROXICET) 5-325 MG per tablet Take 1 tablet by mouth every 4 (four) hours as needed.      . penicillin v potassium (VEETID) 500 MG tablet Take 500 mg by mouth 3 (three) times daily as needed. For when she has a dental procedure done.      . tiotropium (SPIRIVA HANDIHALER) 18 MCG inhalation capsule Place 1  capsule (18 mcg total) into inhaler and inhale daily.  30 capsule  11  . ALPRAZolam (XANAX PO) Take by mouth.        Physical Exam BP 135/90  Pulse 93  Resp 16  Ht 5' 5.5" (1.664 m)  Wt 128 lb (58.06 kg)  BMI 20.98 kg/m2  SpO49 52% 61 year old woman in no acute distress Lungs with faint wheezes bilaterally, diminished breath sounds left base No cervical or supraclavicular adenopathy Cardiac regular rate and rhythm S1 and S2 No epitrochlear adenopathy No peripheral edema  Diagnostic Tests: CT of chest 06/23/12 *RADIOLOGY REPORT*  Clinical Data: Lung cancer, follow-up.  CT CHEST WITHOUT CONTRAST  Technique: Multidetector CT imaging of the chest was performed  following the standard protocol without IV contrast.  Comparison: PET CT 11/06/2011  Findings: Moderate COPD changes. Postoperative changes in the left  upper lobe. No residual or recurrent nodule. Scarring or  atelectasis at the left base. Nodular density at the left base is  best seen on coronal imaging on today's study. This has enlarged  since prior chest CT, measuring 9 mm and greatest diameter compared  to 6 mm previously.  Heart is normal size. Aorta is normal caliber. Small scattered  mediastinal lymph nodes, none  pathologically enlarged or  significantly changed since prior CT. No pleural effusions.  Visualized thyroid and chest wall soft tissues unremarkable.  Imaging into the upper abdomen shows no acute findings. Stable low  density lesions within the liver, likely cysts. These are not  hypermetabolic on prior PET CT.  No acute bony abnormality.  IMPRESSION:  Postoperative changes in the left apex.  COPD.  Enlarging nodule in the right lung base, 9 mm compared 6 mm  previously.   Impression: 61 year old woman with a history tobacco abuse now 6 months out from left upper lobectomy for stage IA non small cell carcinoma.   She has no evidence of recurrent disease. Her CT scan does show an increase in the  size of a left lower lobe nodule from 6 to 9 mm. However there is some scarring in this area confluent with the nodule. At this point in time I would recommend followup of that area with a repeat CT of the chest in 3 months to  She says she has only smoked one cigarette in the past 3 weeks. She remains uncertain as to whether she will continue her abstinence.  Plan: CT of chest, no contrast, in 3 months.  I will see her back at that time.

## 2012-09-08 ENCOUNTER — Other Ambulatory Visit: Payer: Self-pay | Admitting: *Deleted

## 2012-09-08 DIAGNOSIS — C3490 Malignant neoplasm of unspecified part of unspecified bronchus or lung: Secondary | ICD-10-CM

## 2012-09-14 ENCOUNTER — Encounter: Payer: Self-pay | Admitting: Emergency Medicine

## 2012-09-14 ENCOUNTER — Ambulatory Visit (INDEPENDENT_AMBULATORY_CARE_PROVIDER_SITE_OTHER): Payer: BC Managed Care – PPO | Admitting: Emergency Medicine

## 2012-09-14 VITALS — BP 122/88 | HR 86 | Temp 98.1°F | Ht 65.5 in | Wt 128.6 lb

## 2012-09-14 DIAGNOSIS — C3490 Malignant neoplasm of unspecified part of unspecified bronchus or lung: Secondary | ICD-10-CM

## 2012-09-14 DIAGNOSIS — J449 Chronic obstructive pulmonary disease, unspecified: Secondary | ICD-10-CM

## 2012-09-14 MED ORDER — ACYCLOVIR 5 % EX OINT: 1.0000 "application " | TOPICAL_OINTMENT | CUTANEOUS | Status: DC | PRN

## 2012-09-14 NOTE — Assessment & Plan Note (Signed)
Enlarging nodule concerning - she is planning for repeat Ct scan in January and f/u w Dr Dorris Fetch

## 2012-09-14 NOTE — Assessment & Plan Note (Signed)
Continue the Spiriva and ventolin She has stopped smoking!! rov 6 months

## 2012-09-14 NOTE — Addendum Note (Signed)
Addended by: Leslye Peer on: 09/14/2012 05:49 PM   Modules accepted: Orders

## 2012-09-14 NOTE — Patient Instructions (Addendum)
Please continue your Spiriva and ventolin Get your CT scan as planned by Dr Dorris Fetch Follow with Dr Delton Coombes in 6 months or sooner if you have any problems

## 2012-09-14 NOTE — Progress Notes (Signed)
Subjective:    Patient ID: Valerie Mosley, female    DOB: [birth year], 61 y.o.   MRN: [id number] HPI 61 yo WF , active smoker,  seen for initial pulmonary consult during hospital stay for hemoptysis 07/29/11 >tx for presumed CAP   08/13/2011 Post Hospital  Admitted 11/12-11/19/12 for hemoptysis with underling presumed COPD and supsected CAP. CT chest showed 1. 1.1 cm left upper lobe mass, Patchy density in the right upper lobe and minimal patchy density in the right lower lobe  She underwent  FOB from 11/13, all blood was on the R (small apical nodule is on the left). No endobronchial lesion noted but RML and RLL were occluded w clot - path, bx's confirm no malignancy. Tx for  pneumonia (no organism specified) with clinical improvement with abx, discharged on Avelox .   She has a known hx of  left upper lobe nodule with prev. FOB  that was neg for per pt.    Since discharge she is feeling much better with decreased cough and congesiton . No further hemopytsis. She is trying to cut back on smoking.   Hosp F/U visit 08/30/11 -- 61 yo smoker with probable COPD, admitted 07/29/11 with hemoptysis and AE-COPD vs PNA. At CT scan showed a LUL 1.1 cm spiculated nodule. She was rx w abx and for AE COPD. No longer having any hemoptysis. CXR 11/29 showed improvement in RLL and RML PNA. Returns now for f/u.  She is down to 3 -4 cig a day. She has some exertional SOB, but not severe. She uses SABA 2x a week.   ROV 09/19/11 -- COPD/tobacco use, recent hosp for hemoptysis as above, LUL nodule on CT scan. Returns today after PFT, had repeat CT on 12/17 that shows stable spiculated LUL nodule (? Smaller). Cigarettes at 4 cig a day. Rare SABA use.   ROV 10/29/11 -- COPD, continued tobacco, LUL nodule (last CT 09/02/11). Breathing better since Spiriva started last time. Still smoking. Need to discuss CT and options for f/u - serial scans vs possible primary resection.   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 QUIT SMOKING!  Wearing O2, need to qualify. Has been tolerating Spiriva. Has some DOE when she hurries. Has not needed Ventolin.   ROV 04/20/12 -- COPD, s/p LUL lobectomy 12/17/11 (Dr Dorris Fetch) for adeno CA.  She has had a lot of ups and downs emotionally - sounds like depression; has been treated with xanax for some anxiety. She doesn't believe she can do her former activties - breathing and fatigue do influence this. She is on Spiriva, seems to be helping her. Has not needed SABA for over a month. No exacerbations. Her walking oximetry showed that she didn't need O2 anymore (after being d/c post-op). She has had 5 cigarettes since her last visit.   ROV 09/14/12 -- COPD, s/p LUL lobectomy 12/17/11 (Dr Dorris Fetch) for adeno CA.  She has a RLL nodule that enlarged from 6mm to 9mm on scan in 10/13, plan is for a repeat in January. She has been stable with regard to breathing. Remains on Spiriva. Has not needed ventolin. She is having some L rib cage occasional catches or pain w a deep breath.  Objective:   Physical Exam Filed Vitals:   09/14/12 1609  BP: 122/88  Pulse: 86  Temp: 98.1 F (36.7 C)   GEN: A/Ox3; pleasant , NAD, well nourished   HEENT:  [place]/AT,  EACs-clear, TMs-wnl, NOSE-clear, THROAT-clear, no lesions, no postnasal drip or exudate noted.   NECK:  Supple w/ fair ROM; no JVD; normal carotid impulses w/o bruits; no thyromegaly or nodules palpated; no lymphadenopathy.  RESP  Clear  P & A; w/o, wheezes/ rales/ or rhonchi.no accessory muscle use, no dullness to percussion  CARD:  RRR, no m/r/g  , no peripheral edema, pulses intact, no cyanosis or clubbing.  Musco: Warm bil, no deformities or joint swelling noted.   Neuro: alert, no focal deficits noted.    Skin: Warm, no  lesions or rashes   CXR 03/24/12 Clinical Data: Follow-up lung cancer surgery 2 months ago. Ex-  smoker.  CHEST - 2 VIEW  Comparison: 01/08/2012.  Findings: Stable linear scarring in the medial aspect of the left  upper lobe with stable left hilar surgical clips. The left  hemidiaphragm remains elevated. The left heart borders remain  obscured with no gross cardiac enlargement. Diffuse osteopenia and  mild thoracic spine degenerative changes.  IMPRESSION:  1. No acute abnormality.  2. Stable left postsurgical changes and stable changes of COPD.      Assessment & Plan:   Non-small cell lung cancer Enlarging nodule concerning - she is planning for repeat Ct scan in January and f/u w Dr Dorris Fetch  COPD (chronic obstructive pulmonary disease) Continue the Spiriva and ventolin She has stopped smoking!! rov 6 months

## 2012-09-29 ENCOUNTER — Encounter: Payer: BC Managed Care – PPO | Admitting: Thoracic Surgery (Cardiothoracic Vascular Surgery)

## 2012-09-29 ENCOUNTER — Other Ambulatory Visit: Payer: BC Managed Care – PPO

## 2012-10-27 ENCOUNTER — Ambulatory Visit
Admission: RE | Admit: 2012-10-27 | Discharge: 2012-10-27 | Disposition: A | Discharge status: Home / Self Care | Payer: BC Managed Care – PPO | Source: Ambulatory Visit | Attending: Thoracic Surgery (Cardiothoracic Vascular Surgery) | Admitting: Thoracic Surgery (Cardiothoracic Vascular Surgery)

## 2012-10-27 ENCOUNTER — Ambulatory Visit (INDEPENDENT_AMBULATORY_CARE_PROVIDER_SITE_OTHER): Payer: BC Managed Care – PPO | Admitting: Thoracic Surgery (Cardiothoracic Vascular Surgery)

## 2012-10-27 ENCOUNTER — Encounter: Payer: Self-pay | Admitting: Thoracic Surgery (Cardiothoracic Vascular Surgery)

## 2012-10-27 VITALS — BP 122/74 | HR 104 | Resp 20 | Ht 65.5 in | Wt 128.0 lb

## 2012-10-27 DIAGNOSIS — C3490 Malignant neoplasm of unspecified part of unspecified bronchus or lung: Secondary | ICD-10-CM

## 2012-10-27 DIAGNOSIS — C3410 Malignant neoplasm of upper lobe, unspecified bronchus or lung: Secondary | ICD-10-CM

## 2012-10-27 DIAGNOSIS — Z9889 Other specified postprocedural states: Secondary | ICD-10-CM

## 2012-10-27 DIAGNOSIS — Z09 Encounter for follow-up examination after completed treatment for conditions other than malignant neoplasm: Secondary | ICD-10-CM

## 2012-10-27 DIAGNOSIS — Z902 Acquired absence of lung [part of]: Secondary | ICD-10-CM

## 2012-10-27 NOTE — Progress Notes (Signed)
HPI:  Valerie Mosley returns for a scheduled 9 month followup visit. She had a left upper lobectomy for stage IA lung cancer in April 2013. She was last in the opposite October at that time a CT scan showed multiple small nodules in the right lung. There was one nodule in the right lower lobe that is increased from 6 mm to 9 mm in size. We elected just follow that up with a repeat CT scan. She says that she's not had any respiratory problems in the interim since her last visit. Says that she doesn't have much energy and just doesn't feel well. She has essentially quit smoking. Her weight is stable. She's not had any neurologic symptoms or new bone or joint pain.  Past Medical History  Diagnosis Date  . Hypertension   . Kyphoscoliosis   . COPD (chronic obstructive pulmonary disease)   . Cough 07/29/11    started coughing up blood this am  . Cancer     lung nodule  . PONV (postoperative nausea and vomiting)   . Recurrent upper respiratory infection (URI)   . Cough         Current Outpatient Prescriptions  Medication Sig Dispense Refill  . acetaminophen (TYLENOL) 500 MG tablet Take 500 mg by mouth every 6 (six) hours as needed. For pain or fever      . acyclovir ointment (ZOVIRAX) 5 % Apply 1 application topically every 3 (three) hours as needed. Cold sores  3 g  2  . albuterol (VENTOLIN HFA) 108 (90 BASE) MCG/ACT inhaler Inhale 2 puffs into the lungs every 6 (six) hours as needed. For shortness of breath      . amLODipine (NORVASC) 10 MG tablet Take 10 mg by mouth daily.        Marland Kitchen loratadine (CLARITIN) 10 MG tablet Take 10 mg by mouth daily as needed. For allergies      . oxyCODONE-acetaminophen (PERCOCET/ROXICET) 5-325 MG per tablet Take 1 tablet by mouth every 4 (four) hours as needed.      . penicillin v potassium (VEETID) 500 MG tablet Take 500 mg by mouth 3 (three) times daily as needed. For when she has a dental procedure done.      . tiotropium (SPIRIVA HANDIHALER) 18 MCG inhalation  capsule Place 1 capsule (18 mcg total) into inhaler and inhale daily.  30 capsule  11   No current facility-administered medications for this visit.   Review of systems See above  Physical Exam BP 122/74  Pulse 104  Resp 20  Ht 5' 5.5" (1.664 m)  Wt 128 lb (58.06 kg)  BMI 20.97 kg/m2  SpO68 11% 62 year old woman in no acute distress Well-developed well-nourished No cervical or supraclavicular adenopathy Lungs diminished breath sounds bilaterally otherwise clear Cardiac regular rate and rhythm normal S1 and S2  Diagnostic Tests: CT chest 10/27/12 *RADIOLOGY REPORT*  Clinical Data: History of left upper lobectomy for adenocarcinoma,  evaluate for recurrence  CT CHEST WITHOUT CONTRAST  Technique: Multidetector CT imaging of the chest was performed  following the standard protocol without IV contrast.  Comparison: CT chest of 06/23/2012  Findings: On the lung window images, postoperative changes in left  hemithorax are noted. Diffuse changes of centrilobular emphysema  are again particularly involving the upper lung fields. However,  there are new nodules present with a noncalcified 4-mm nodule in  the right upper lobe on image number 15, a 4-mm nodule in the left  upper lobe on image of 25, slightly more prominent  nodule adjacent  to the right mediastinum on image number 25, and a slightly larger  nodule anteriorly in the right upper lobe on image number 25 as  well now measuring 6 mm compared to 4 mm on the prior study. These  new nodular lesions primarily in the right upper lobe are worrisome  for metastatic disease. Linear scarring at the left lung base is  stable, and there is mild scarring peripherally in the right middle  lobe. No infiltrate or effusion is seen.  On soft tissue window images, asymmetric enlargement of the right  lobe of thyroid is stable. No enlarging mediastinal adenopathy is  noted. Multiple hepatic cysts are scattered throughout the liver  and appear  stable. There is a thoracic kyphosis present with  diffuse degenerative change.  IMPRESSION:  1. New small lung nodules primarily the right upper lobe worrisome  for metastatic disease. Recommend continued follow-up.  2. Postoperative changes are stable throughout the left  hemithorax.  3. Centrilobular emphysema.   Impression: Valerie Mosley is a 62 year old woman who had a left upper lobectomy for stage IA non-small cell carcinoma back in April of 2013. She now is about 9-10 months out from surgery. Her CT scan shows multiple small nodules bilaterally, but primarily in the right lower lobe. The nodule that was of most concern last time had not increased in size in the last 3 months. She does have multiple other small nodules and severe nodules that were noted. The radiologist felt a couple of nodules of increase in size although the changes are probably within the margin of the error of the scan. So my opinion this point she still has no evidence recurrent disease. However these nodules do need to continue to have close followup and if they were to enlarge sufficiently would need biopsy.  Plan: Return in 3 months for one year followup visit with CT of chest at that time.

## 2012-11-05 ENCOUNTER — Other Ambulatory Visit: Payer: Self-pay | Admitting: Emergency Medicine

## 2013-01-22 ENCOUNTER — Other Ambulatory Visit: Payer: Self-pay

## 2013-01-22 DIAGNOSIS — D381 Neoplasm of uncertain behavior of trachea, bronchus and lung: Secondary | ICD-10-CM

## 2013-02-02 ENCOUNTER — Ambulatory Visit: Payer: BC Managed Care – PPO | Admitting: Thoracic Surgery (Cardiothoracic Vascular Surgery)

## 2013-02-23 ENCOUNTER — Ambulatory Visit
Admission: RE | Admit: 2013-02-23 | Discharge: 2013-02-23 | Disposition: A | Discharge status: Home / Self Care | Payer: BC Managed Care – PPO | Source: Ambulatory Visit | Attending: Thoracic Surgery (Cardiothoracic Vascular Surgery) | Admitting: Thoracic Surgery (Cardiothoracic Vascular Surgery)

## 2013-02-23 ENCOUNTER — Ambulatory Visit (INDEPENDENT_AMBULATORY_CARE_PROVIDER_SITE_OTHER): Payer: BC Managed Care – PPO | Admitting: Thoracic Surgery (Cardiothoracic Vascular Surgery)

## 2013-02-23 ENCOUNTER — Encounter: Payer: Self-pay | Admitting: Thoracic Surgery (Cardiothoracic Vascular Surgery)

## 2013-02-23 VITALS — BP 124/86 | HR 87 | Resp 18 | Ht 65.5 in | Wt 128.0 lb

## 2013-02-23 DIAGNOSIS — C3490 Malignant neoplasm of unspecified part of unspecified bronchus or lung: Secondary | ICD-10-CM

## 2013-02-23 DIAGNOSIS — Z9889 Other specified postprocedural states: Secondary | ICD-10-CM

## 2013-02-23 DIAGNOSIS — D381 Neoplasm of uncertain behavior of trachea, bronchus and lung: Secondary | ICD-10-CM

## 2013-02-23 DIAGNOSIS — C3492 Malignant neoplasm of unspecified part of left bronchus or lung: Secondary | ICD-10-CM

## 2013-02-23 DIAGNOSIS — R918 Other nonspecific abnormal finding of lung field: Secondary | ICD-10-CM

## 2013-02-23 DIAGNOSIS — Z902 Acquired absence of lung [part of]: Secondary | ICD-10-CM

## 2013-02-23 NOTE — Progress Notes (Signed)
HPI:  [name] is a 62 year old woman who had a left upper lobectomy for stage IA non-small cell carcinoma back in April 2013. She was last seen in February of this year at which time she was doing well with no evidence of recurrent disease. She has had multiple small lung nodules on CT scan. They have remained relatively stable over time.  In the interim since her last visit she says that she's been doing well. She's not had any exacerbations of her COPD. She is concerned that as the weather warms up it will affect her breathing. Her weight has been stable. She has occasional cough, but no hemoptysis. She's not noted any unusual headaches or visual changes, or any new or unusual bone or joint pain.  She continues to refrain from smoking.  Past Medical History  Diagnosis Date  . Hypertension   . Kyphoscoliosis   . COPD (chronic obstructive pulmonary disease)   . Cough 07/29/11    started coughing up blood this am  . Cancer     lung nodule  . PONV (postoperative nausea and vomiting)   . Recurrent upper respiratory infection (URI)   . Cough       Current Outpatient Prescriptions  Medication Sig Dispense Refill  . acetaminophen (TYLENOL) 500 MG tablet Take 500 mg by mouth every 6 (six) hours as needed. For pain or fever      . acyclovir ointment (ZOVIRAX) 5 % Apply 1 application topically every 3 (three) hours as needed. Cold sores  3 g  2  . albuterol (VENTOLIN HFA) 108 (90 BASE) MCG/ACT inhaler Inhale 2 puffs into the lungs every 6 (six) hours as needed. For shortness of breath      . amLODipine (NORVASC) 10 MG tablet Take 10 mg by mouth daily.        Marland Kitchen loratadine (CLARITIN) 10 MG tablet Take 10 mg by mouth daily as needed. For allergies      . oxyCODONE-acetaminophen (PERCOCET/ROXICET) 5-325 MG per tablet Take 1 tablet by mouth every 4 (four) hours as needed.      . penicillin v potassium (VEETID) 500 MG tablet Take 500 mg by mouth 3 (three) times daily as needed. For when she  has a dental procedure done.      Marland Kitchen SPIRIVA HANDIHALER 18 MCG inhalation capsule inhale the contents of one capsule in the handihaler once daily  30 capsule  11   No current facility-administered medications for this visit.   Review of systems  See history of present illness   Physical Exam BP 124/86  Pulse 87  Resp 18  Ht 5' 5.5" (1.664 m)  Wt 128 lb (58.06 kg)  BMI 20.97 kg/m2  SpO65 84% 62 year old woman in no acute distress Well-developed well-nourished Neurologic alert and oriented x3 with no focal deficits Cardiac regular rate and rhythm normal S1 and S2 Lungs diminished bilaterally, no wheezes No cervical or supraclavicular adenopathy Skin atrophic with multiple ecchymoses  Diagnostic Tests: *RADIOLOGY REPORT*  Clinical Data: Follow up lung nodules, history of lung cancer  CT CHEST WITHOUT CONTRAST  Technique: Multidetector CT imaging of the chest was performed  following the standard protocol without IV contrast.  Comparison: 10/17/2012 and 06/23/2012  Findings: Again noted hyperinflation and emphysematous changes.  Postoperative changes in the left apex are stable.  Axial image 25 nodule in the right upper lobe anteriorly measures 4  mm stable from prior exam. Second nodule in the right upper lobe  medially measures 6 mm stable  in size and appearance from prior  exam.  A ground-glass nodule in axial image 43 right upper lobe  peripherally is stable in size and appearance from prior exam.  Stable scarring in the left lower lobe laterally. No new pulmonary  nodules are noted. No focal infiltrate or consolidation.  Sagittal images of the spine shows diffuse osteopenia. Stable  degenerative changes. No destructive bony lesions are noted.  Heart size is within normal limits. No pericardial effusion.  Stable hepatic cysts. Slight prominent right thyroid lobe is  stable from prior exam. Central airways are patent.  No hilar or mediastinal adenopathy. Atherosclerotic  calcifications  of thoracic aorta again noted.  IMPRESSION:  1. Again noted hyperinflation and bilateral emphysematous changes  predominantly upper lobes. Stable nodules in the right upper lobe.  No new pulmonary nodules. Stable postsurgical changes left apex.  2. Stable scarring in the left lower lobe laterally. No acute  infiltrate or pulmonary edema.  3. No adenopathy.  4. Stable hepatic cysts.  5. No evidence of bony metastatic disease.  Original Report Authenticated By: Natasha Mead, M.D.    Impression: 62 year old woman now over a year out from the left upper lobectomy for stage IA non-small cell cancer. She has no evidence recurrent disease at this time. She does have multiple small lung nodules visible on CT. These are unchanged in the interim since her last CT scan. They do need to continue to be followed.  Plan: Return in 4 months with CT of chest to followup lung nodules

## 2013-03-17 ENCOUNTER — Encounter: Payer: Self-pay | Admitting: Emergency Medicine

## 2013-03-17 ENCOUNTER — Ambulatory Visit (INDEPENDENT_AMBULATORY_CARE_PROVIDER_SITE_OTHER): Payer: BC Managed Care – PPO | Admitting: Emergency Medicine

## 2013-03-17 VITALS — BP 104/74 | HR 95 | Temp 98.3°F | Ht 65.0 in | Wt 126.6 lb

## 2013-03-17 DIAGNOSIS — C3490 Malignant neoplasm of unspecified part of unspecified bronchus or lung: Secondary | ICD-10-CM

## 2013-03-17 DIAGNOSIS — J449 Chronic obstructive pulmonary disease, unspecified: Secondary | ICD-10-CM

## 2013-03-17 NOTE — Progress Notes (Signed)
Subjective:    Patient ID: Valerie Mosley, female    DOB: 23-Aug-1951, 62 y.o.   MRN: [id number] HPI 62 yo WF , active smoker,  seen for initial pulmonary consult during hospital stay for hemoptysis 07/29/11 >tx for presumed CAP   08/13/2011 [facility]  Admitted 11/12-11/19/12 for hemoptysis with underling presumed COPD and supsected CAP. CT chest showed 1. 1.1 cm left upper lobe mass, Patchy density in the right upper lobe and minimal patchy density in the right lower lobe  She underwent  FOB from 11/13, all blood was on the R (small apical nodule is on the left). No endobronchial lesion noted but RML and RLL were occluded w clot - path, bx's confirm no malignancy. Tx for  pneumonia (no organism specified) with clinical improvement with abx, discharged on Avelox .   She has a known hx of  left upper lobe nodule with prev. FOB  that was neg for per pt.    Since discharge she is feeling much better with decreased cough and congesiton . No further hemopytsis. She is trying to cut back on smoking.   Hosp F/U visit 08/30/11 -- 62 yo smoker with probable COPD, admitted 07/29/11 with hemoptysis and AE-COPD vs PNA. At CT scan showed a LUL 1.1 cm spiculated nodule. She was rx w abx and for AE COPD. No longer having any hemoptysis. CXR 11/29 showed improvement in RLL and RML PNA. Returns now for f/u.  She is down to 3 -4 cig a day. She has some exertional SOB, but not severe. She uses SABA 2x a week.   ROV 09/19/11 -- COPD/tobacco use, recent hosp for hemoptysis as above, LUL nodule on CT scan. Returns today after PFT, had repeat CT on 12/17 that shows stable spiculated LUL nodule (? Smaller). Cigarettes at 4 cig a day. Rare SABA use.   ROV 10/29/11 -- COPD, continued tobacco, LUL nodule (last CT 09/02/11). Breathing better since Spiriva started last time. Still smoking. Need to discuss CT and options for f/u - serial scans vs possible primary resection.   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 QUIT SMOKING!  Wearing O2, need to qualify. Has been tolerating Spiriva. Has some DOE when she hurries. Has not needed Ventolin.   ROV 04/20/12 -- COPD, s/p LUL lobectomy 12/17/11 (Dr Roxan Hockey) for adeno CA.  She has had a lot of ups and downs emotionally - sounds like depression; has been treated with xanax for some anxiety. She doesn't believe she can do her former activties - breathing and fatigue do influence this. She is on Spiriva, seems to be helping her. Has not needed SABA for over a month. No exacerbations. Her walking oximetry showed that she didn't need O2 anymore (after being d/c post-op). She has had 5 cigarettes since her last visit.   ROV 09/14/12 -- COPD, s/p LUL lobectomy 12/17/11 (Dr Roxan Hockey) for adeno CA.  She has a RLL nodule that enlarged from 78mm to 44mm on scan in 10/13, plan is for a repeat in January. She has been stable with regard to breathing. Remains on Spiriva. Has not needed ventolin. She is having some L rib cage occasional catches or pain w a deep breath.  ROV 03/17/13 -- COPD, s/p LUL lobectomy 12/17/11 (Dr Dorris Fetch) for adeno CA. We are following several nodules, stable on latest CT scans 10/27/12 and 02/23/13. She feels that her breathing is stable, although she does worse in the severe heat and humidity. No new cough, wheeze. She has had some HA last 24 hours. She took loratadine and improved. She is using albuterol rarely. No AE since last time.    Objective:   Filed Vitals:   03/17/13 1430  BP: 104/74  Pulse: 95  Temp: 98.3 F (36.8 C)  TempSrc: Oral  Height: 5\' 5"  (1.651 m)  Weight: 126 lb 9.6 oz (57.425 kg)  SpO2: 95%   GEN: A/Ox3; pleasant , NAD, well nourished   HEENT:  [place]/AT,  EACs-clear, TMs-wnl, NOSE-clear, THROAT-clear, no lesions, no  postnasal drip or exudate noted.   NECK:  Supple w/ fair ROM; no JVD; normal carotid impulses w/o bruits; no thyromegaly or nodules palpated; no lymphadenopathy.  RESP  Clear  P & A; w/o, wheezes/ rales/ or rhonchi.no accessory muscle use, no dullness to percussion  CARD:  RRR, no m/r/g  , no peripheral edema, pulses intact, no cyanosis or clubbing.  Musco: Warm bil, no deformities or joint swelling noted.   Neuro: alert, no focal deficits noted.    Skin: Warm, no lesions or rashes   CXR 03/24/12 Clinical Data: Follow-up lung cancer surgery 2 months ago. Ex-  smoker.  CHEST - 2 VIEW  Comparison: 01/08/2012.  Findings: Stable linear scarring in the medial aspect of the left  upper lobe with stable left hilar surgical clips. The left  hemidiaphragm remains elevated. The left heart borders remain  obscured with no gross cardiac enlargement. Diffuse osteopenia and  mild thoracic spine degenerative changes.  IMPRESSION:  1. No acute abnormality.  2. Stable left postsurgical changes and stable changes of COPD.      Assessment & Plan:   COPD (chronic obstructive pulmonary disease) Please continue your Spiriva  Use albuterol as needed Get your flu shot this fall.  You will need your pneumonia shot after age 59  Non-small cell lung cancer Few small pulm nodules largest 6mm. Stable by last 2 CT scans. Being followed by Dr Dorris Fetch

## 2013-03-17 NOTE — Assessment & Plan Note (Signed)
Few small pulm nodules largest 6mm. Stable by last 2 CT scans. Being followed by Dr Dorris Fetch

## 2013-03-17 NOTE — Assessment & Plan Note (Signed)
Please continue your Spiriva  Use albuterol as needed Get your flu shot this fall.  You will need your pneumonia shot after age 64

## 2013-03-17 NOTE — Patient Instructions (Addendum)
Please continue your Spiriva  Use albuterol as needed Get your flu shot this fall.  You will need your pneumonia shot after age 64.  Get CT scan as planned by Dr Dorris Fetch Follow with Dr Delton Coombes in 6 months or sooner if you have any problems

## 2013-06-11 ENCOUNTER — Other Ambulatory Visit: Payer: Self-pay | Admitting: *Deleted

## 2013-06-11 DIAGNOSIS — R918 Other nonspecific abnormal finding of lung field: Secondary | ICD-10-CM

## 2013-06-17 ENCOUNTER — Other Ambulatory Visit: Payer: Self-pay | Admitting: *Deleted

## 2013-06-17 DIAGNOSIS — R918 Other nonspecific abnormal finding of lung field: Secondary | ICD-10-CM

## 2013-07-06 ENCOUNTER — Other Ambulatory Visit: Payer: BC Managed Care – PPO

## 2013-07-06 ENCOUNTER — Ambulatory Visit: Payer: BC Managed Care – PPO | Admitting: Thoracic Surgery (Cardiothoracic Vascular Surgery)

## 2013-07-13 ENCOUNTER — Ambulatory Visit: Payer: BC Managed Care – PPO | Admitting: Thoracic Surgery (Cardiothoracic Vascular Surgery)

## 2013-07-13 ENCOUNTER — Ambulatory Visit (INDEPENDENT_AMBULATORY_CARE_PROVIDER_SITE_OTHER): Payer: BC Managed Care – PPO | Admitting: Thoracic Surgery (Cardiothoracic Vascular Surgery)

## 2013-07-13 ENCOUNTER — Encounter: Payer: Self-pay | Admitting: Thoracic Surgery (Cardiothoracic Vascular Surgery)

## 2013-07-13 ENCOUNTER — Ambulatory Visit
Admission: RE | Admit: 2013-07-13 | Discharge: 2013-07-13 | Disposition: A | Discharge status: Home / Self Care | Payer: BC Managed Care – PPO | Source: Ambulatory Visit | Attending: Thoracic Surgery (Cardiothoracic Vascular Surgery) | Admitting: Thoracic Surgery (Cardiothoracic Vascular Surgery)

## 2013-07-13 DIAGNOSIS — Z9889 Other specified postprocedural states: Secondary | ICD-10-CM

## 2013-07-13 DIAGNOSIS — Z902 Acquired absence of lung [part of]: Secondary | ICD-10-CM

## 2013-07-13 DIAGNOSIS — R918 Other nonspecific abnormal finding of lung field: Secondary | ICD-10-CM

## 2013-07-13 DIAGNOSIS — C3410 Malignant neoplasm of upper lobe, unspecified bronchus or lung: Secondary | ICD-10-CM

## 2013-07-13 NOTE — Progress Notes (Signed)
HPI:  Valerie Mosley to 4 month followup visit. She is a 62 year old woman with a history of tobacco abuse and COPD. We did a thoracoscopic left upper lobectomy for stage IA non-small cell carcinoma back in April 2013. His last in the office in June of this year at which time she was doing recently well and had no evidence recurrent disease. She saw Dr. Delton Coombes a couple of weeks later.  In the interim since her last visit she's been doing well. Her weight is stable. She does complain of some pain and irritation of the left thigh. She's not had any headaches or double vision. She says that her breathing gets worse with changes in the weather. She is using her albuterol occasionally. She is still using her Spiriva on a regular basis.  Past Medical History  Diagnosis Date  . Hypertension   . Kyphoscoliosis   . COPD (chronic obstructive pulmonary disease)   . Cough 07/29/11    started coughing up blood this am  . Cancer     lung nodule  . PONV (postoperative nausea and vomiting)   . Recurrent upper respiratory infection (URI)   . Cough      Current Outpatient Prescriptions  Medication Sig Dispense Refill  . acetaminophen (TYLENOL) 500 MG tablet Take 500 mg by mouth every 6 (six) hours as needed. For pain or fever      . acyclovir ointment (ZOVIRAX) 5 % Apply 1 application topically every 3 (three) hours as needed. Cold sores  3 g  2  . albuterol (VENTOLIN HFA) 108 (90 BASE) MCG/ACT inhaler Inhale 2 puffs into the lungs every 6 (six) hours as needed. For shortness of breath      . amLODipine (NORVASC) 10 MG tablet Take 10 mg by mouth daily.        Marland Kitchen loratadine (CLARITIN) 10 MG tablet Take 10 mg by mouth daily as needed. For allergies      . penicillin v potassium (VEETID) 500 MG tablet Take 500 mg by mouth 3 (three) times daily as needed. For when she has a dental procedure done.      Marland Kitchen SPIRIVA HANDIHALER 18 MCG inhalation capsule inhale the contents of one capsule in the handihaler once daily   30 capsule  11   No current facility-administered medications for this visit.    Physical Exam BP 123/87  Pulse 93  Resp 16  Ht 5\' 5"  (1.651 m)  Wt 128 lb (58.06 kg)  BMI 21.3 kg/m2  SpO59 52% 62 year old woman in no acute distress Neurologic alert and oriented x3 with no focal deficits No cervical or suprapubic or adenopathy Lungs diminished breath sounds bilaterally left greater than right Cardiac regular rate and rhythm normal S1 and S2  Diagnostic Tests: CT chest 07/13/2013 CLINICAL DATA: Followup pulmonary nodules. History of lung cancer.  Left upper lobe adenocarcinoma post resection.  EXAM:  CT CHEST WITHOUT CONTRAST  TECHNIQUE:  Multidetector CT imaging of the chest was performed following the  standard protocol without IV contrast.  COMPARISON: 02/23/2013  FINDINGS:  Again noted are emphysematous changes throughout the lungs. Stable  postoperative changes in the left upper lung.  Right upper lobe nodule again noted, measuring 5 mm on image 26  compared with 4-5 mm previously. This is not felt to be  significantly changed, likely related to slight differences in scan  planes. Medial right upper lobe nodule on the same image also  stable, measuring 6 mm. The previously seen ground-glass nodule  peripherally  and inferiorly in the right upper lobe is again noted  (actually in the right middle lobe) but appears to be related to  bandlike scarring on the coronal image.  Scarring in both lung bases, left greater than right. No new or  enlarging pulmonary nodules. No pleural effusions.  Scattered small mediastinal lymph nodes, not pathologically enlarged  and stable. Heart is normal size. No axillary adenopathy.  Small nodule within the isthmus of the thyroid is again noted,  stable. Chest wall soft tissues are unremarkable.  Imaging in the upper abdomen demonstrates numerous hypodensities  throughout the liver, most likely hepatic cysts. These are stable.  No acute  bony abnormality.  IMPRESSION: .:  IMPRESSION: .  Stable postoperative changes in the left upper lung. COPD.  Small scattered right lung pulmonary nodules, stable. No new or  enlarging pulmonary nodules.  No acute findings.  Electronically Signed  By: Charlett Nose M.D.  On: 07/13/2013 14:57   Impression: 62 year old woman who is now roughly a year and a half out from a thoracoscopic left upper lobectomy for a stage IA non-small cell carcinoma. She has no evidence recurrent disease. She quit smoking at the time of her surgery and has not started back. She does still have multiple tiny bilateral lung nodules that we are continuing to follow.  Plan: Return in 6 months with CT of chest for 2 year followup visit

## 2013-08-05 ENCOUNTER — Telehealth: Payer: Self-pay | Admitting: Emergency Medicine

## 2013-08-05 ENCOUNTER — Encounter: Payer: Self-pay | Admitting: Critical Care Medicine

## 2013-08-05 ENCOUNTER — Ambulatory Visit (INDEPENDENT_AMBULATORY_CARE_PROVIDER_SITE_OTHER): Payer: BC Managed Care – PPO | Admitting: Critical Care Medicine

## 2013-08-05 VITALS — BP 124/86 | HR 93 | Temp 97.8°F | Ht 64.5 in | Wt 126.0 lb

## 2013-08-05 DIAGNOSIS — J449 Chronic obstructive pulmonary disease, unspecified: Secondary | ICD-10-CM

## 2013-08-05 MED ORDER — METHYLPREDNISOLONE ACETATE 80 MG/ML IJ SUSP
120.0000 mg | Freq: Once | INTRAMUSCULAR | Status: AC
  Administered 2013-08-05: 120 mg via INTRAMUSCULAR

## 2013-08-05 MED ORDER — AMOXICILLIN-POT CLAVULANATE 875-125 MG PO TABS: 1.0000 | ORAL_TABLET | Freq: Two times a day (BID) | ORAL | Status: DC

## 2013-08-05 MED ORDER — FLUTICASONE PROPIONATE 50 MCG/ACT NA SUSP: 2.0000 | Freq: Every day | NASAL | Status: DC

## 2013-08-05 NOTE — Patient Instructions (Signed)
Depo medrol 120mg  IM was given Augmentin 875mg  twice daily for 10days Flonase two puff ea nostril daily Saline rinse 2-3 puff ea nostril three times daily Stay on Spiriva Consider nicorette minis 4mg  as needed instead of smoking Keep next scheduled appt with Dr Delton Coombes, call sooner if unimproved

## 2013-08-05 NOTE — Telephone Encounter (Signed)
I called and spoke with pt. appt scheduled nothing further needed

## 2013-08-06 NOTE — Assessment & Plan Note (Signed)
Copd with AB flare  Plan Depo medrol 120mg  IM was given Augmentin 875mg  twice daily for 10days Flonase two puff ea nostril daily Saline rinse 2-3 puff ea nostril three times daily Stay on Spiriva Consider nicorette minis 4mg  as needed instead of smoking Keep next scheduled appt with Dr Delton Coombes, call sooner if unimproved

## 2013-08-06 NOTE — Progress Notes (Signed)
Subjective:    Patient ID: Valerie Mosley, female    DOB: [birth year], 62 y.o.   MRN: [id number]  HPI  Subjective:    Patient ID: Valerie Mosley, female    DOB: 11-Jan-1951, 62 y.o.   MRN: [id number] HPI 62 yo WF , active smoker,  seen for initial pulmonary consult during hospital stay for hemoptysis 07/29/11 >tx for presumed CAP   [facility]  Admitted 11/12-11/19/12 for hemoptysis with underling presumed COPD and supsected CAP. CT chest showed 1. 1.1 cm left upper lobe mass, Patchy density in the right upper lobe and minimal patchy density in the right lower lobe  She underwent  FOB from 11/13, all blood was on the R (small apical nodule is on the left). No endobronchial lesion noted but RML and RLL were occluded w clot - path, bx's confirm no malignancy. Tx for  pneumonia (no organism specified) with clinical improvement with abx, discharged on Avelox .   She has a known hx of  left upper lobe nodule with prev. FOB  that was neg for per pt.    Since discharge she is feeling much better with decreased cough and congesiton . No further hemopytsis. She is trying to cut back on smoking.   Hosp F/U visit 08/30/11 -- 62 yo smoker with probable COPD, admitted 07/29/11 with hemoptysis and AE-COPD vs PNA. At CT scan showed a LUL 1.1 cm spiculated nodule. She was rx w abx and for AE COPD. No longer having any hemoptysis. CXR 11/29 showed improvement in RLL and RML PNA. Returns now for f/u.  She is down to 3 -4 cig a day. She has some exertional SOB, but not severe. She uses SABA 2x a week.   ROV 09/19/11 -- COPD/tobacco use, recent hosp for hemoptysis as above, LUL nodule on CT scan. Returns today after PFT, had repeat CT on 12/17 that shows stable spiculated LUL nodule (? Smaller). Cigarettes at 4 cig a day. Rare SABA use.   ROV 10/29/11 -- COPD, continued tobacco, LUL nodule (last CT 09/02/11). Breathing better since Spiriva started last time. Still smoking. Need to discuss CT and options  for f/u - serial scans vs possible primary resection.   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 QUIT SMOKING!  Wearing O2, need to qualify. Has been tolerating Spiriva. Has some DOE when she hurries. Has not needed Ventolin.   ROV 04/20/12 -- COPD, s/p LUL lobectomy 12/17/11 (Dr Dorris Fetch) for adeno CA.  She has had a lot of ups and downs emotionally - sounds like depression; has been treated with xanax for some anxiety. She doesn't believe she can do her former activties - breathing and fatigue do influence this. She is on Spiriva, seems to be helping her. Has not needed SABA for over a month. No exacerbations. Her walking oximetry showed that she didn't need O2 anymore (after being d/c post-op). She has had 5 cigarettes since her last visit.   ROV 09/14/12 -- COPD, s/p LUL lobectomy 12/17/11 (Dr Dorris Fetch) for adeno CA.  She has a RLL nodule that enlarged from 6mm to 9mm on scan in 10/13, plan is for a repeat in January. She has been stable with regard to breathing.  Remains on Spiriva. Has not needed ventolin. She is having some L rib cage occasional catches or pain w a deep breath.   ROV 03/17/13 -- COPD, s/p LUL lobectomy 12/17/11 (Dr Dorris Fetch) for adeno CA. We are following several nodules, stable on latest CT scans 10/27/12 and 02/23/13. She feels that her breathing is stable, although she does worse in the severe heat and humidity. No new cough, wheeze. She has had some HA last 24 hours. She took loratadine and improved. She is using albuterol rarely. No AE since last time.   08/05/2013 Chief Complaint  Patient presents with  . Acute Visit    RB pt.  Symptoms started on Monday with a sore throat.  Now has nasal congestion with clear mucus, prod cough with clear to tan mucus, and  increased DOE.  Chills last night.    Pt with acute symptoms of thick yellow secretions, cough, dyspnea. No f/c/s.  Symptoms x 3d   Objective:   Filed Vitals:   08/05/13 1129  BP: 124/86  Pulse: 93  Temp: 97.8 F (36.6 C)  TempSrc: Oral  Height: 5' 4.5" (1.638 m)  Weight: 126 lb (57.153 kg)  SpO2: 96%   GEN: A/Ox3; pleasant , NAD, well nourished   HEENT:  [place]/AT,  EACs-clear, TMs-wnl, NOSE-clear, THROAT-clear, no lesions, no postnasal drip or exudate noted.   NECK:  Supple w/ fair ROM; no JVD; normal carotid impulses w/o bruits; no thyromegaly or nodules palpated; no lymphadenopathy.  RESP  Exp wheezes. Poor airflow  CARD:  RRR, no m/r/g  , no peripheral edema, pulses intact, no cyanosis or clubbing.  Musco: Warm bil, no deformities or joint swelling noted.   Neuro: alert, no focal deficits noted.    Skin: Warm, no lesions or rashes       Assessment & Plan:   COPD (chronic obstructive pulmonary disease) Copd with AB flare  Plan Depo medrol 120mg  IM was given Augmentin 875mg  twice daily for 10days Flonase two puff ea nostril daily Saline rinse 2-3 puff ea nostril three times daily Stay on Spiriva Consider nicorette minis 4mg  as needed instead of smoking Keep next scheduled appt with Dr Delton Coombes, call sooner if unimproved      Review of Systems     Objective:   Physical Exam        Assessment & Plan:

## 2013-09-21 ENCOUNTER — Encounter: Payer: Self-pay | Admitting: Emergency Medicine

## 2013-09-21 ENCOUNTER — Ambulatory Visit (INDEPENDENT_AMBULATORY_CARE_PROVIDER_SITE_OTHER): Payer: BC Managed Care – PPO | Admitting: Emergency Medicine

## 2013-09-21 VITALS — BP 130/80 | HR 82 | Ht 65.0 in | Wt 124.4 lb

## 2013-09-21 DIAGNOSIS — C3490 Malignant neoplasm of unspecified part of unspecified bronchus or lung: Secondary | ICD-10-CM

## 2013-09-21 DIAGNOSIS — C3492 Malignant neoplasm of unspecified part of left bronchus or lung: Secondary | ICD-10-CM

## 2013-09-21 DIAGNOSIS — J449 Chronic obstructive pulmonary disease, unspecified: Secondary | ICD-10-CM

## 2013-09-21 MED ORDER — ALBUTEROL SULFATE HFA 108 (90 BASE) MCG/ACT IN AERS: 2.0000 | INHALATION_SPRAY | Freq: Four times a day (QID) | RESPIRATORY_TRACT | Status: DC | PRN

## 2013-09-21 NOTE — Assessment & Plan Note (Signed)
Stable, still has exertional SOB.  - spiriva qd - SABA prn, rarely uses - rov 6

## 2013-09-21 NOTE — Patient Instructions (Signed)
Restart your nasal steroid spray, 1 spray each side after supper each evening  Continue your Spiriva daily Use albuterol if needed for shortness of breath Congratulations on stopping smoking Follow with Dr Lamonte Sakai in 6 months or sooner if you have any problems

## 2013-09-21 NOTE — Progress Notes (Signed)
Subjective:    Patient ID: Valerie Mosley, female    DOB: 23-Aug-1951, 63 y.o.   MRN: [id number] HPI 63 yo WF , active smoker,  seen for initial pulmonary consult during hospital stay for hemoptysis 07/29/11 >tx for presumed CAP   08/13/2011 [facility]  Admitted 11/12-11/19/12 for hemoptysis with underling presumed COPD and supsected CAP. CT chest showed 1. 1.1 cm left upper lobe mass, Patchy density in the right upper lobe and minimal patchy density in the right lower lobe  She underwent  FOB from 11/13, all blood was on the R (small apical nodule is on the left). No endobronchial lesion noted but RML and RLL were occluded w clot - path, bx's confirm no malignancy. Tx for  pneumonia (no organism specified) with clinical improvement with abx, discharged on Avelox .   She has a known hx of  left upper lobe nodule with prev. FOB  that was neg for per pt.    Since discharge she is feeling much better with decreased cough and congesiton . No further hemopytsis. She is trying to cut back on smoking.   Hosp F/U visit 08/30/11 -- 63 yo smoker with probable COPD, admitted 07/29/11 with hemoptysis and AE-COPD vs PNA. At CT scan showed a LUL 1.1 cm spiculated nodule. She was rx w abx and for AE COPD. No longer having any hemoptysis. CXR 11/29 showed improvement in RLL and RML PNA. Returns now for f/u.  She is down to 3 -4 cig a day. She has some exertional SOB, but not severe. She uses SABA 2x a week.   ROV 09/19/11 -- COPD/tobacco use, recent hosp for hemoptysis as above, LUL nodule on CT scan. Returns today after PFT, had repeat CT on 12/17 that shows stable spiculated LUL nodule (? Smaller). Cigarettes at 4 cig a day. Rare SABA use.   ROV 10/29/11 -- COPD, continued tobacco, LUL nodule (last CT 09/02/11). Breathing better since Spiriva started last time. Still smoking. Need to discuss CT and options for f/u - serial scans vs possible primary resection.   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 QUIT SMOKING!  Wearing O2, need to qualify. Has been tolerating Spiriva. Has some DOE when she hurries. Has not needed Ventolin.   ROV 04/20/12 -- COPD, s/p LUL lobectomy 12/17/11 (Dr Roxan Hockey) for adeno CA.  She has had a lot of ups and downs emotionally - sounds like depression; has been treated with xanax for some anxiety. She doesn't believe she can do her former activties - breathing and fatigue do influence this. She is on Spiriva, seems to be helping her. Has not needed SABA for over a month. No exacerbations. Her walking oximetry showed that she didn't need O2 anymore (after being d/c post-op). She has had 5 cigarettes since her last visit.   ROV 09/14/12 -- COPD, s/p LUL lobectomy 12/17/11 (Dr Roxan Hockey) for adeno CA.  She has a RLL nodule that enlarged from 78mm to 44mm on scan in 10/13, plan is for a repeat in January. She has been stable with regard to breathing. Remains on Spiriva. Has not needed ventolin. She is having some L rib cage occasional catches or pain w a deep breath.  ROV 03/17/13 -- COPD, s/p LUL lobectomy 12/17/11 (Dr Roxan Hockey) for adeno CA. We are following several nodules, stable on latest CT scans 10/27/12 and 02/23/13. She feels that her breathing is stable, although she does worse in the severe heat and humidity. No new cough, wheeze. She has had some HA last 24 hours. She took loratadine and improved. She is using albuterol rarely. No AE since last time.   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 We are following several nodules, stable on latest CT scans 10/27/12 and 02/23/13. She had a repeat scan 07/13/13 > stable. She was recently seen by Dr Joya Gaskins 08/05/13 for an AE, received abx, depo, nasal steroid, [place]. She is much better, back  to baseline. Not using the flonase anymore. Used the [employer] for 3 days. She is on spiriva. Stopped smoking in October '14. She coughs at night when her sinuses drain.    Objective:   Filed Vitals:   09/21/13 1535  BP: 130/80  Pulse: 82  Height: 5\' 5"  (1.651 m)  Weight: 124 lb 6.4 oz (56.427 kg)  SpO2: 99%   GEN: A/Ox3; pleasant , NAD, well nourished   HEENT:  [place]/AT,  EACs-clear, TMs-wnl, NOSE-clear, THROAT-clear, no lesions, no postnasal drip or exudate noted.   NECK:  Supple w/ fair ROM; no JVD; normal carotid impulses w/o bruits; no thyromegaly or nodules palpated; no lymphadenopathy.  RESP  Clear  P & A; w/o, wheezes/ rales/ or rhonchi.no accessory muscle use, no dullness to percussion  CARD:  RRR, no m/r/g  , no peripheral edema, pulses intact, no cyanosis or clubbing.  Musco: Warm bil, no deformities or joint swelling noted.   Neuro: alert, no focal deficits noted.    Skin: Warm, no lesions or rashes  07/13/13 --  COMPARISON: 02/23/2013  FINDINGS:  Again noted are emphysematous changes throughout the lungs. Stable  postoperative changes in the left upper lung.  Right upper lobe nodule again noted, measuring 5 mm on image 26  compared with 4-5 mm previously. This is not felt to be  significantly changed, likely related to slight differences in scan  planes. Medial right upper lobe nodule on the same image also  stable, measuring 6 mm. The previously seen ground-glass nodule  peripherally and inferiorly in the right upper lobe is again noted  (actually in the right middle lobe) but appears to be related to  bandlike scarring on the coronal image.  Scarring in both lung bases, left greater than right. No new or  enlarging pulmonary nodules. No pleural effusions.  Scattered small mediastinal lymph nodes, not pathologically enlarged  and stable. Heart is normal size. No axillary adenopathy.  Small nodule within the isthmus of the thyroid is again noted,  stable. Chest wall  soft tissues are unremarkable.  Imaging in the upper abdomen demonstrates numerous hypodensities  throughout the liver, most likely hepatic cysts. These are stable.  No acute bony abnormality.  IMPRESSION: .:  IMPRESSION: .  Stable postoperative changes in the left upper lung. COPD.  Small scattered right lung pulmonary nodules, stable. No new or  enlarging pulmonary nodules.  No acute findings.      Assessment & Plan:   Non-small cell lung cancer Following serial CT scans with Dr Roxan Hockey, no interval change  COPD (chronic obstructive pulmonary disease) Stable, still has exertional SOB.  - spiriva qd - SABA prn, rarely uses - rov 6

## 2013-09-21 NOTE — Assessment & Plan Note (Signed)
Following serial CT scans with Dr Roxan Hockey, no interval change

## 2013-10-11 ENCOUNTER — Ambulatory Visit (INDEPENDENT_AMBULATORY_CARE_PROVIDER_SITE_OTHER): Payer: BC Managed Care – PPO | Admitting: Emergency Medicine

## 2013-10-11 ENCOUNTER — Encounter: Payer: Self-pay | Admitting: Emergency Medicine

## 2013-10-11 VITALS — BP 112/80 | HR 77 | Temp 98.3°F | Ht 64.0 in | Wt 125.6 lb

## 2013-10-11 DIAGNOSIS — J449 Chronic obstructive pulmonary disease, unspecified: Secondary | ICD-10-CM

## 2013-10-11 MED ORDER — METHYLPREDNISOLONE ACETATE 80 MG/ML IJ SUSP
120.0000 mg | Freq: Once | INTRAMUSCULAR | Status: AC
  Administered 2013-10-11: 120 mg via INTRAMUSCULAR

## 2013-10-11 MED ORDER — PREDNISONE 10 MG PO TABS: ORAL_TABLET | ORAL | Status: DC

## 2013-10-11 NOTE — Progress Notes (Signed)
Subjective:    Patient ID: Valerie Mosley, female    DOB: 1951-06-13, 63 y.o.   MRN: [id number] HPI 63 yo WF , active smoker,  seen for initial pulmonary consult during hospital stay for hemoptysis 07/29/11 >tx for presumed CAP   08/13/2011 [facility]  Admitted 11/12-11/19/12 for hemoptysis with underling presumed COPD and supsected CAP. CT chest showed 1. 1.1 cm left upper lobe mass, Patchy density in the right upper lobe and minimal patchy density in the right lower lobe  She underwent  FOB from 11/13, all blood was on the R (small apical nodule is on the left). No endobronchial lesion noted but RML and RLL were occluded w clot - path, bx's confirm no malignancy. Tx for  pneumonia (no organism specified) with clinical improvement with abx, discharged on Avelox .   She has a known hx of  left upper lobe nodule with prev. FOB  that was neg for per pt.    Since discharge she is feeling much better with decreased cough and congesiton . No further hemopytsis. She is trying to cut back on smoking.   Hosp F/U visit 08/30/11 -- 63 yo smoker with probable COPD, admitted 07/29/11 with hemoptysis and AE-COPD vs PNA. At CT scan showed a LUL 1.1 cm spiculated nodule. She was rx w abx and for AE COPD. No longer having any hemoptysis. CXR 11/29 showed improvement in RLL and RML PNA. Returns now for f/u.  She is down to 3 -4 cig a day. She has some exertional SOB, but not severe. She uses SABA 2x a week.   ROV 09/19/11 -- COPD/tobacco use, recent hosp for hemoptysis as above, LUL nodule on CT scan. Returns today after PFT, had repeat CT on 12/17 that shows stable spiculated LUL nodule (? Smaller). Cigarettes at 4 cig a day. Rare SABA use.   ROV 10/29/11 -- COPD, continued tobacco, LUL nodule (last CT 09/02/11). Breathing better since Spiriva started last time. Still smoking. Need to discuss CT and options for f/u - serial scans vs possible primary resection.   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 QUIT SMOKING!  Wearing O2, need to qualify. Has been tolerating Spiriva. Has some DOE when she hurries. Has not needed Ventolin.   ROV 04/20/12 -- COPD, s/p LUL lobectomy 12/17/11 (Dr Roxan Hockey) for adeno CA.  She has had a lot of ups and downs emotionally - sounds like depression; has been treated with xanax for some anxiety. She doesn't believe she can do her former activties - breathing and fatigue do influence this. She is on Spiriva, seems to be helping her. Has not needed SABA for over a month. No exacerbations. Her walking oximetry showed that she didn't need O2 anymore (after being d/c post-op). She has had 5 cigarettes since her last visit.   ROV 09/14/12 -- COPD, s/p LUL lobectomy 12/17/11 (Dr Roxan Hockey) for adeno CA.  She has a RLL nodule that enlarged from 63mm to 52mm on scan in 10/13, plan is for a repeat in January. She has been stable with regard to breathing. Remains on Spiriva. Has not needed ventolin. She is having some L rib cage occasional catches or pain w a deep breath.  ROV 03/17/13 -- COPD, s/p LUL lobectomy 12/17/11 (Dr Roxan Hockey) for adeno CA. We are following several nodules, stable on latest CT scans 10/27/12 and 02/23/13. She feels that her breathing is stable, although she does worse in the severe heat and humidity. No new cough, wheeze. She has had some HA last 24 hours. She took loratadine and improved. She is using albuterol rarely. No AE since last time.   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 We are following several nodules, stable on latest CT scans 10/27/12 and 02/23/13. She had a repeat scan 07/13/13 > stable. She was recently seen by Dr Joya Gaskins 08/05/13 for an AE, received abx, depo, nasal steroid, [place]. She is much better, back  to baseline. Not using the flonase anymore. Used the [employer] for 3 days. She is on spiriva. Stopped smoking in October '14. She coughs at night when her sinuses drain.   ROV 10/11/13 -- COPD, s/p LUL lobectomy 12/17/11 (Dr Roxan Hockey) for adeno CA. Nodules stable last CT 10/13/12.  She had been well until about 3-4 days ago. Some nasal congestion, non-prod cough returned at that time. No fever. No sick contacts. Spiriva + SABA.    Objective:   Filed Vitals:   10/11/13 1503  BP: 112/80  Pulse: 77  Temp: 98.3 F (36.8 C)  TempSrc: Oral  Height: 5\' 4"  (1.626 m)  Weight: 125 lb 9.6 oz (56.972 kg)  SpO2: 97%   GEN: A/Ox3; pleasant , NAD, well nourished   HEENT:  /AT,  EACs-clear, TMs-wnl, NOSE-clear, THROAT-clear, no lesions, no postnasal drip or exudate noted.   NECK:  Supple w/ fair ROM; no JVD; normal carotid impulses w/o bruits; no thyromegaly or nodules palpated; no lymphadenopathy.  RESP  Clear  P & A; w/o, wheezes/ rales/ or rhonchi.no accessory muscle use, no dullness to percussion  CARD:  RRR, no m/r/g  , no peripheral edema, pulses intact, no cyanosis or clubbing.  Musco: Warm bil, no deformities or joint swelling noted.   Neuro: alert, no focal deficits noted.    Skin: Warm, no lesions or rashes  07/13/13 --  COMPARISON: 02/23/2013  FINDINGS:  Again noted are emphysematous changes throughout the lungs. Stable  postoperative changes in the left upper lung.  Right upper lobe nodule again noted, measuring 5 mm on image 26  compared with 4-5 mm previously. This is not felt to be  significantly changed, likely related to slight differences in scan  planes. Medial right upper lobe nodule on the same image also  stable, measuring 6 mm. The previously seen ground-glass nodule  peripherally and inferiorly in the right upper lobe is again noted  (actually in the right middle lobe) but appears to be related to  bandlike scarring on the coronal image.  Scarring in both lung bases,  left greater than right. No new or  enlarging pulmonary nodules. No pleural effusions.  Scattered small mediastinal lymph nodes, not pathologically enlarged  and stable. Heart is normal size. No axillary adenopathy.  Small nodule within the isthmus of the thyroid is again noted,  stable. Chest wall soft tissues are unremarkable.  Imaging in the upper abdomen demonstrates numerous hypodensities  throughout the liver, most likely hepatic cysts. These are stable.  No acute bony abnormality.  IMPRESSION: .:  IMPRESSION: .  Stable postoperative changes in the left upper lung. COPD.  Small scattered right lung pulmonary nodules, stable. No new or  enlarging pulmonary nodules.  No acute findings.  Assessment & Plan:   COPD (chronic obstructive pulmonary disease) Exacerbated - but this is really characterized by severe cough as opposed to wheezing. Her rhinitis is severe, cough severe and non-productive. I believe we will need to continue the fluticasone +/- other allergy regimen to avoid repeating this cycle. (she stopped the fluticasone because her cough was better...) - DepoMedrol today - pred taper - restart fluticasone - loratadine - add chlorpheniramine - rov 6 weeks

## 2013-10-11 NOTE — Assessment & Plan Note (Addendum)
Exacerbated - but this is really characterized by severe cough as opposed to wheezing. Her rhinitis is severe, cough severe and non-productive. I believe we will need to continue the fluticasone +/- other allergy regimen to avoid repeating this cycle. (she stopped the fluticasone because her cough was better...) - DepoMedrol today - pred taper - restart fluticasone - loratadine - add chlorpheniramine - rov 6 weeks

## 2013-10-11 NOTE — Addendum Note (Signed)
Addended by: Rosana Berger on: 10/11/2013 04:40 PM   Modules accepted: Orders

## 2013-10-11 NOTE — Patient Instructions (Signed)
DepoMedrol given today Take prednisone as directed to completion Continue loratadine Restart fluticasone nasal spray and take it every day, even when your cough is under control Try using decongestants OTC that contain chlorpheniramine Continue spiriva every day Use albuterol as needed Follow with Dr Lamonte Sakai in 4-6 weeks

## 2013-11-09 ENCOUNTER — Ambulatory Visit: Payer: BC Managed Care – PPO | Admitting: Emergency Medicine

## 2013-11-24 ENCOUNTER — Ambulatory Visit: Payer: BC Managed Care – PPO | Admitting: Emergency Medicine

## 2013-11-29 ENCOUNTER — Other Ambulatory Visit: Payer: Self-pay | Admitting: *Deleted

## 2013-11-29 DIAGNOSIS — C3490 Malignant neoplasm of unspecified part of unspecified bronchus or lung: Secondary | ICD-10-CM

## 2013-12-14 ENCOUNTER — Encounter: Payer: Self-pay | Admitting: Emergency Medicine

## 2013-12-14 ENCOUNTER — Ambulatory Visit (INDEPENDENT_AMBULATORY_CARE_PROVIDER_SITE_OTHER): Payer: BC Managed Care – PPO | Admitting: Emergency Medicine

## 2013-12-14 ENCOUNTER — Other Ambulatory Visit: Payer: Self-pay | Admitting: Emergency Medicine

## 2013-12-14 VITALS — BP 110/74 | HR 96 | Ht 64.0 in | Wt 119.2 lb

## 2013-12-14 DIAGNOSIS — C3490 Malignant neoplasm of unspecified part of unspecified bronchus or lung: Secondary | ICD-10-CM

## 2013-12-14 DIAGNOSIS — J449 Chronic obstructive pulmonary disease, unspecified: Secondary | ICD-10-CM

## 2013-12-14 NOTE — Assessment & Plan Note (Signed)
-   continue Spiriva - allergy regimen restarted

## 2013-12-14 NOTE — Progress Notes (Signed)
Subjective:    Patient ID: Valerie Mosley, female    DOB: [birth year], 63 y.o.   MRN: [id number] HPI 63 yo WF , active smoker,  seen for initial pulmonary consult during hospital stay for hemoptysis 07/29/11 >tx for presumed CAP   08/13/2011 [facility]  Admitted 11/12-11/19/12 for hemoptysis with underling presumed COPD and supsected CAP. CT chest showed 1. 1.1 cm left upper lobe mass, Patchy density in the right upper lobe and minimal patchy density in the right lower lobe  She underwent  FOB from 11/13, all blood was on the R (small apical nodule is on the left). No endobronchial lesion noted but RML and RLL were occluded w clot - path, bx's confirm no malignancy. Tx for  pneumonia (no organism specified) with clinical improvement with abx, discharged on Avelox .   She has a known hx of  left upper lobe nodule with prev. FOB  that was neg for per pt.    Since discharge she is feeling much better with decreased cough and congesiton . No further hemopytsis. She is trying to cut back on smoking.   Hosp F/U visit 08/30/11 -- 63 yo smoker with probable COPD, admitted 07/29/11 with hemoptysis and AE-COPD vs PNA. At CT scan showed a LUL 1.1 cm spiculated nodule. She was rx w abx and for AE COPD. No longer having any hemoptysis. CXR 11/29 showed improvement in RLL and RML PNA. Returns now for f/u.  She is down to 3 -4 cig a day. She has some exertional SOB, but not severe. She uses SABA 2x a week.   ROV 09/19/11 -- COPD/tobacco use, recent hosp for hemoptysis as above, LUL nodule on CT scan. Returns today after PFT, had repeat CT on 12/17 that shows stable spiculated LUL nodule (? Smaller). Cigarettes at 4 cig a day. Rare SABA use.   ROV 10/29/11 -- COPD, continued tobacco, LUL nodule (last CT 09/02/11). Breathing better since Spiriva started last time. Still smoking. Need to discuss CT and options for f/u - serial scans vs possible primary resection.   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 QUIT SMOKING!  Wearing O2, need to qualify. Has been tolerating Spiriva. Has some DOE when she hurries. Has not needed Ventolin.   ROV 04/20/12 -- COPD, s/p LUL lobectomy 12/17/11 (Dr Roxan Hockey) for adeno CA.  She has had a lot of ups and downs emotionally - sounds like depression; has been treated with xanax for some anxiety. She doesn't believe she can do her former activties - breathing and fatigue do influence this. She is on Spiriva, seems to be helping her. Has not needed SABA for over a month. No exacerbations. Her walking oximetry showed that she didn't need O2 anymore (after being d/c post-op). She has had 5 cigarettes since her last visit.   ROV 09/14/12 -- COPD, s/p LUL lobectomy 12/17/11 (Dr Roxan Hockey) for adeno CA.  She has a RLL nodule that enlarged from 26mm to 48mm on scan in 10/13, plan is for a repeat in January. She has been stable with regard to breathing. Remains on Spiriva. Has not needed ventolin. She is having some L rib cage occasional catches or pain w a deep breath.  ROV 03/17/13 -- COPD, s/p LUL lobectomy 12/17/11 (Dr Roxan Hockey) for adeno CA. We are following several nodules, stable on latest CT scans 10/27/12 and 02/23/13. She feels that her breathing is stable, although she does worse in the severe heat and humidity. No new cough, wheeze. She has had some HA last 24 hours. She took loratadine and improved. She is using albuterol rarely. No AE since last time.   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 We are following several nodules, stable on latest CT scans 10/27/12 and 02/23/13. She had a repeat scan 07/13/13 > stable. She was recently seen by Dr Joya Gaskins 08/05/13 for an AE, received abx, depo, nasal steroid, [place]. She is much better, back  to baseline. Not using the flonase anymore. Used the [employer] for 3 days. She is on spiriva. Stopped smoking in October '14. She coughs at night when her sinuses drain.   ROV 10/11/13 -- COPD, s/p LUL lobectomy 12/17/11 (Dr Roxan Hockey) for adeno CA. Nodules stable last CT 10/13/12.  She had been well until about 3-4 days ago. Some nasal congestion, non-prod cough returned at that time. No fever. No sick contacts. Spiriva + SABA.   ROV 12/14/13 -- COPD, s/p LUL lobectomy 12/17/11 (Dr Roxan Hockey) for adeno CA. Nodules stable last CT 10/13/12. On last visit her cough was flaring and we restarted fluticasone, gave steroid taper. She felt better and stopped nasal steroid and loratadine.    Objective:   Filed Vitals:   12/14/13 0921  BP: 110/74  Pulse: 96  Height: 5\' 4"  (1.626 m)  Weight: 119 lb 3.2 oz (54.069 kg)  SpO2: 97%   GEN: A/Ox3; pleasant , NAD, well nourished   HEENT:  [place]/AT,  EACs-clear, TMs-wnl, NOSE-clear, THROAT-clear, no lesions, no postnasal drip or exudate noted.   NECK:  Supple w/ fair ROM; no JVD; normal carotid impulses w/o bruits; no thyromegaly or nodules palpated; no lymphadenopathy.  RESP  Clear  P & A; w/o, wheezes/ rales/ or rhonchi.  CARD:  RRR, no m/r/g  , no peripheral edema, pulses intact, no cyanosis or clubbing.  Musco: Warm bil, no deformities or joint swelling noted.   Neuro: alert, no focal deficits noted.    Skin: Warm, no lesions or rashes  07/13/13 --  COMPARISON: 02/23/2013  FINDINGS:  Again noted are emphysematous changes throughout the lungs. Stable  postoperative changes in the left upper lung.  Right upper lobe nodule again noted, measuring 5 mm on image 26  compared with 4-5 mm previously. This is not felt to be  significantly changed, likely related to slight differences in scan  planes. Medial right upper lobe nodule on the same image also  stable, measuring 6 mm. The previously seen ground-glass nodule  peripherally and inferiorly in the right  upper lobe is again noted  (actually in the right middle lobe) but appears to be related to  bandlike scarring on the coronal image.  Scarring in both lung bases, left greater than right. No new or  enlarging pulmonary nodules. No pleural effusions.  Scattered small mediastinal lymph nodes, not pathologically enlarged  and stable. Heart is normal size. No axillary adenopathy.  Small nodule within the isthmus of the thyroid is again noted,  stable. Chest wall soft tissues are unremarkable.  Imaging in the upper abdomen demonstrates numerous hypodensities  throughout the liver, most likely hepatic cysts. These are stable.  No acute bony abnormality.  IMPRESSION: .:  IMPRESSION: .  Stable postoperative changes  in the left upper lung. COPD.  Small scattered right lung pulmonary nodules, stable. No new or  enlarging pulmonary nodules.  No acute findings.      Assessment & Plan:   COPD (chronic obstructive pulmonary disease) - continue Spiriva - allergy regimen restarted  Non-small cell lung cancer - post lobectomy - repeat scan in 2 weeks and planning follow up with Dr Roxan Hockey

## 2013-12-14 NOTE — Assessment & Plan Note (Signed)
-   post lobectomy - repeat scan in 2 weeks and planning follow up with Dr Roxan Hockey

## 2013-12-14 NOTE — Patient Instructions (Signed)
Please continue your Spiriva daily Restart loratadine 10mg  daily Restart fluticasone nasal spray, 2 sprays each side twice a day. You may decrease to once a day if your allergies are well controlled.  You may continue to use chlorpheniramine as needed for allergies.  Follow with Dr Lamonte Sakai in 4 months or sooner if you have any problems.

## 2013-12-28 ENCOUNTER — Ambulatory Visit
Admission: RE | Admit: 2013-12-28 | Discharge: 2013-12-28 | Disposition: A | Discharge status: Home / Self Care | Payer: BC Managed Care – PPO | Source: Ambulatory Visit | Attending: Thoracic Surgery (Cardiothoracic Vascular Surgery) | Admitting: Thoracic Surgery (Cardiothoracic Vascular Surgery)

## 2013-12-28 ENCOUNTER — Ambulatory Visit (INDEPENDENT_AMBULATORY_CARE_PROVIDER_SITE_OTHER): Payer: BC Managed Care – PPO | Admitting: Thoracic Surgery (Cardiothoracic Vascular Surgery)

## 2013-12-28 ENCOUNTER — Encounter: Payer: Self-pay | Admitting: Thoracic Surgery (Cardiothoracic Vascular Surgery)

## 2013-12-28 VITALS — BP 101/72 | HR 100 | Resp 16 | Ht 64.0 in | Wt 119.0 lb

## 2013-12-28 DIAGNOSIS — Z9889 Other specified postprocedural states: Secondary | ICD-10-CM

## 2013-12-28 DIAGNOSIS — Z902 Acquired absence of lung [part of]: Secondary | ICD-10-CM

## 2013-12-28 DIAGNOSIS — C3490 Malignant neoplasm of unspecified part of unspecified bronchus or lung: Secondary | ICD-10-CM

## 2013-12-28 DIAGNOSIS — C3410 Malignant neoplasm of upper lobe, unspecified bronchus or lung: Secondary | ICD-10-CM

## 2013-12-28 NOTE — Progress Notes (Signed)
HPI:  Mrs. returns today for a 2 year followup visit.  She is a 63 year old former smoker who had a thoracoscopic left upper lobectomy for a stage IA non-small cell carcinoma in 2013. She was last in the office in October at which time she was doing well with no evidence of recurrent disease.  In the interim since that visit she's been doing well for the most part. She has had a couple of flares of COPD requiring steroids. Other than that she has not had any problems. She says she has been exercising on a regular basis walking her dog. She's not smoking. Her weight has been stable.  Past Medical History  Diagnosis Date  . Hypertension   . Kyphoscoliosis   . COPD (chronic obstructive pulmonary disease)   . Cough 07/29/11    started coughing up blood this am  . Cancer     lung nodule  . PONV (postoperative nausea and vomiting)   . Recurrent upper respiratory infection (URI)   . Cough       Current Outpatient Prescriptions  Medication Sig Dispense Refill  . acetaminophen (TYLENOL) 500 MG tablet Take 500 mg by mouth every 6 (six) hours as needed. For pain or fever      . acyclovir ointment (ZOVIRAX) 5 % Apply 1 application topically every 3 (three) hours as needed. Cold sores  3 g  2  . albuterol (VENTOLIN HFA) 108 (90 BASE) MCG/ACT inhaler Inhale 2 puffs into the lungs every 6 (six) hours as needed. For shortness of breath  1 Inhaler  6  . amLODipine (NORVASC) 10 MG tablet Take 10 mg by mouth daily.        . fluticasone (FLONASE) 50 MCG/ACT nasal spray Place 2 sprays into both nostrils daily.      Marland Kitchen loratadine (CLARITIN) 10 MG tablet Take 10 mg by mouth daily as needed. For allergies      . penicillin v potassium (VEETID) 500 MG tablet Take 500 mg by mouth 3 (three) times daily as needed. For when she has a dental procedure done.      . sertraline (ZOLOFT) 50 MG tablet Take 50 mg by mouth at bedtime.      Marland Kitchen SPIRIVA HANDIHALER 18 MCG inhalation capsule inhale the contents of one  capsule in the handihaler once daily  30 capsule  11   No current facility-administered medications for this visit.    Physical Exam BP 101/72  Pulse 100  Resp 16  Ht 5\' 4"  (1.626 m)  Wt 119 lb (53.978 kg)  BMI 20.42 kg/m2  SpO47 54% 63 year old woman in no acute distress Alert and oriented x3 with no focal deficits Well-developed and well-nourished No cervical or supraclavicular adenopathy Lungs diminished breath sounds bilaterally, no wheezes Cardiac regular rate and rhythm normal S1-S2  Diagnostic Tests: CT chest 12/28/2013 CT CHEST WITHOUT CONTRAST  TECHNIQUE:  Multidetector CT imaging of the chest was performed following the  standard protocol without IV contrast.  COMPARISON: CT CHEST W/O CM dated 07/13/2013  FINDINGS:  Lungs/Pleura: Status post left upper lobectomy. Moderate to severe  centrilobular emphysema.  Similar 5 mm right upper lobe lung nodule on image 25. Pleural-based  medial right upper lobe nodule measures 5 mm on image 25 and is not  significantly changed.  Mild scarring at the left lung base.  No new or enlarging nodules identified.  No pleural fluid. Similar trace left pleural thickening.  Heart/Mediastinum: No supraclavicular adenopathy. Aortic and branch  vessel atherosclerosis. Heart  size upper normal, with trace chronic  pericardial effusion or thickening. Multivessel coronary artery  atherosclerosis. Small similar middle mediastinal nodes. No  mediastinal or definite hilar adenopathy, given limitations of  unenhanced CT.  Upper Abdomen: Multiple hepatic cysts, similar. Left renal atrophy  with left nephrolithiasis. Normal appearance of bilateral adrenal  glands; incompletely imaged.  Bones/Musculoskeletal: No acute osseous abnormality. Mild  accentuation of expected thoracic kyphosis.  IMPRESSION:  1. Status post left upper lobectomy, without evidence of locally  recurrent or metastatic disease.  2. Moderate centrilobular emphysema with  similar right upper lobe  nodules.  3. Age advanced coronary artery atherosclerosis. Recommend  assessment of coronary risk factors and consideration of medical  therapy.  4. Left nephrolithiasis.  Electronically Signed  By: Abigail Miyamoto M.D.  On: 12/28/2013 12:28  Impression: 63 year old woman who is now 2 years post left upper lobectomy for a stage IA non-small cell carcinoma. She has no evidence recurrent disease. There are 2 small nodules in the right lung that are unchanged over multiple scans.  She is doing well overall but does still have issues related to her COPD. That is managed by Dr. Lamonte Sakai.  Plan: Return in 6 months with CT of chest

## 2014-01-28 ENCOUNTER — Encounter: Payer: Self-pay | Admitting: Podiatrist

## 2014-01-28 ENCOUNTER — Ambulatory Visit (INDEPENDENT_AMBULATORY_CARE_PROVIDER_SITE_OTHER): Payer: BC Managed Care – PPO | Admitting: Podiatrist

## 2014-01-28 ENCOUNTER — Ambulatory Visit (INDEPENDENT_AMBULATORY_CARE_PROVIDER_SITE_OTHER): Payer: BC Managed Care – PPO

## 2014-01-28 VITALS — BP 122/82 | HR 90 | Resp 16 | Ht 65.0 in | Wt 120.0 lb

## 2014-01-28 DIAGNOSIS — M2040 Other hammer toe(s) (acquired), unspecified foot: Secondary | ICD-10-CM

## 2014-01-28 DIAGNOSIS — M21619 Bunion of unspecified foot: Secondary | ICD-10-CM

## 2014-01-28 DIAGNOSIS — L84 Corns and callosities: Secondary | ICD-10-CM

## 2014-01-28 NOTE — Progress Notes (Signed)
   Subjective:    Patient ID: Valerie Mosley, female    DOB: 03-14-1951, 63 y.o.   MRN: [id number]  HPI Comments: Problem with some corns. Right great toe and the second toe . Painful its been there about 11 weeks or more. Have tried to use corn pads , and thought i had got the core out but it left this painful callus behind and the toes are rubbing together      Review of Systems  HENT:       Sinus problems  Respiratory: Positive for shortness of breath.   Musculoskeletal:       Joint pain Difficulty walking   Hematological: Bruises/bleeds easily.  Psychiatric/Behavioral: The patient is nervous/anxious.   All other systems reviewed and are negative.      Objective:   Physical Exam  Patient is awake, alert, and oriented x 3.  In no acute distress.  Vascular status is intact with palpable pedal pulses at 2/4 DP and PT bilateral and capillary refill time within normal limits. Neurological sensation is also intact bilaterally via Semmes Weinstein monofilament at 5/5 sites. Light touch, vibratory sensation, Achilles tendon reflex is intact.  Musculature intact with dorsiflexion, plantarflexion, inversion, eversion. Hallux is laterally deviated and resting closely against the 2nd toe.  Right great toe laterally has a large callus present.  Medial side of the 2nd toe also has a callus present from the rub of the 2 toes.  All nails are discolored, dystrophic and yellow.  Multiple signs of mycotic infection are present.      Assessment & Plan:  Callus of right hallux and 2nd toe.  Hallux abductus right,  Mycotic toenail  Plan:  Debridement carried out today and a pad was dispensed.  Discussed treatment for the toenail fungus and she would like to hold off on lamisil.  She will be seen back prn.  Or if symptoms return

## 2014-01-28 NOTE — Patient Instructions (Signed)
Corns and Calluses Corns are small areas of thickened skin that usually occur on the top, sides, or tip of a toe. They contain a cone-shaped core with a point that can press on a nerve below. This causes pain. Calluses are areas of thickened skin that usually develop on hands, fingers, palms, soles of the feet, and heels. These are areas that experience frequent friction or pressure. CAUSES  Corns are usually the result of rubbing (friction) or pressure from shoes that are too tight or do not fit properly. Calluses are caused by repeated friction and pressure on the affected areas. SYMPTOMS  A hard growth on the skin.  Pain or tenderness under the skin.  Sometimes, redness and swelling.  Increased discomfort while wearing tight-fitting shoes. DIAGNOSIS  Your caregiver can usually tell what the problem is by doing a physical exam. TREATMENT  Removing the cause of the friction or pressure is usually the only treatment needed. However, sometimes medicines can be used to help soften the hardened, thickened areas. These medicines include salicylic acid plasters and 12% ammonium lactate lotion. These medicines should only be used under the direction of your caregiver. HOME CARE INSTRUCTIONS   Try to remove pressure from the affected area.  You may wear donut-shaped corn pads to protect your skin.  You may use a pumice stone or nonmetallic nail file to gently reduce the thickness of a corn.  Wear properly fitted footwear.  If you have calluses on the hands, wear gloves during activities that cause friction.  If you have diabetes, you should regularly examine your feet. Tell your caregiver if you notice any problems with your feet. SEEK IMMEDIATE MEDICAL CARE IF:   You have increased pain, swelling, redness, or warmth in the affected area.  Your corn or callus starts to drain fluid or bleeds.  You are not getting better, even with treatment. Document Released: 06/08/2004 Document  Revised: 11/25/2011 Document Reviewed: 04/30/2011 [facility] Patient Information 2014 [place], [place].

## 2014-03-03 ENCOUNTER — Encounter: Payer: Self-pay | Admitting: Adult Health

## 2014-03-03 ENCOUNTER — Ambulatory Visit (INDEPENDENT_AMBULATORY_CARE_PROVIDER_SITE_OTHER): Payer: BC Managed Care – PPO | Admitting: Adult Health

## 2014-03-03 VITALS — BP 128/74 | HR 85 | Temp 98.0°F | Ht 65.0 in | Wt 125.0 lb

## 2014-03-03 DIAGNOSIS — J441 Chronic obstructive pulmonary disease with (acute) exacerbation: Secondary | ICD-10-CM

## 2014-03-03 MED ORDER — LEVALBUTEROL HCL 0.63 MG/3ML IN NEBU
0.6300 mg | INHALATION_SOLUTION | Freq: Once | RESPIRATORY_TRACT | Status: AC
  Administered 2014-03-03: 0.63 mg via RESPIRATORY_TRACT

## 2014-03-03 MED ORDER — PREDNISONE 10 MG PO TABS: ORAL_TABLET | ORAL | Status: DC

## 2014-03-03 MED ORDER — AMOXICILLIN-POT CLAVULANATE 875-125 MG PO TABS: 1.0000 | ORAL_TABLET | Freq: Two times a day (BID) | ORAL | Status: AC

## 2014-03-03 MED ORDER — ALBUTEROL SULFATE HFA 108 (90 BASE) MCG/ACT IN AERS: 2.0000 | INHALATION_SPRAY | RESPIRATORY_TRACT | Status: DC | PRN

## 2014-03-03 NOTE — Progress Notes (Signed)
Subjective:    Patient ID: Valerie Mosley, female    DOB: [birth year], 63 y.o.   MRN: [id number] HPI 63 yo WF , active smoker,  seen for initial pulmonary consult during hospital stay for hemoptysis 07/29/11 >tx for presumed CAP   08/13/2011 [facility]  Admitted 11/12-11/19/12 for hemoptysis with underling presumed COPD and supsected CAP. CT chest showed 1. 1.1 cm left upper lobe mass, Patchy density in the right upper lobe and minimal patchy density in the right lower lobe  She underwent  FOB from 11/13, all blood was on the R (small apical nodule is on the left). No endobronchial lesion noted but RML and RLL were occluded w clot - path, bx's confirm no malignancy. Tx for  pneumonia (no organism specified) with clinical improvement with abx, discharged on Avelox .   She has a known hx of  left upper lobe nodule with prev. FOB  that was neg for per pt.    Since discharge she is feeling much better with decreased cough and congesiton . No further hemopytsis. She is trying to cut back on smoking.   Hosp F/U visit 08/30/11 -- 63 yo smoker with probable COPD, admitted 07/29/11 with hemoptysis and AE-COPD vs PNA. At CT scan showed a LUL 1.1 cm spiculated nodule. She was rx w abx and for AE COPD. No longer having any hemoptysis. CXR 11/29 showed improvement in RLL and RML PNA. Returns now for f/u.  She is down to 3 -4 cig a day. She has some exertional SOB, but not severe. She uses SABA 2x a week.   ROV 09/19/11 -- COPD/tobacco use, recent hosp for hemoptysis as above, LUL nodule on CT scan. Returns today after PFT, had repeat CT on 12/17 that shows stable spiculated LUL nodule (? Smaller). Cigarettes at 4 cig a day. Rare SABA use.   ROV 10/29/11 -- COPD, continued tobacco, LUL nodule (last CT 09/02/11). Breathing better since Spiriva started last time. Still smoking. Need to discuss CT and options for f/u - serial scans vs possible primary resection.   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 QUIT SMOKING!  Wearing O2, need to qualify. Has been tolerating Spiriva. Has some DOE when she hurries. Has not needed Ventolin.   ROV 04/20/12 -- COPD, s/p LUL lobectomy 12/17/11 (Dr Roxan Hockey) for adeno CA.  She has had a lot of ups and downs emotionally - sounds like depression; has been treated with xanax for some anxiety. She doesn't believe she can do her former activties - breathing and fatigue do influence this. She is on Spiriva, seems to be helping her. Has not needed SABA for over a month. No exacerbations. Her walking oximetry showed that she didn't need O2 anymore (after being d/c post-op). She has had 5 cigarettes since her last visit.   ROV 09/14/12 -- COPD, s/p LUL lobectomy 12/17/11 (Dr Roxan Hockey) for adeno CA.  She has a RLL nodule that enlarged from 5mm to 11mm on scan in 10/13, plan is for a repeat in January. She has been stable with regard to breathing. Remains on Spiriva. Has not needed ventolin. She is having some L rib cage occasional catches or pain w a deep breath.  ROV 03/17/13 -- COPD, s/p LUL lobectomy 12/17/11 (Dr Roxan Hockey) for adeno CA. We are following several nodules, stable on latest CT scans 10/27/12 and 02/23/13. She feels that her breathing is stable, although she does worse in the severe heat and humidity. No new cough, wheeze. She has had some HA last 24 hours. She took loratadine and improved. She is using albuterol rarely. No AE since last time.   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 We are following several nodules, stable on latest CT scans 10/27/12 and 02/23/13. She had a repeat scan 07/13/13 > stable. She was recently seen by Dr Joya Gaskins 08/05/13 for an AE, received abx, depo, nasal steroid, [place]. She is much better, back  to baseline. Not using the flonase anymore. Used the [employer] for 3 days. She is on spiriva. Stopped smoking in October '14. She coughs at night when her sinuses drain.   ROV 10/11/13 -- COPD, s/p LUL lobectomy 12/17/11 (Dr Roxan Hockey) for adeno CA. Nodules stable last CT 10/13/12.  She had been well until about 3-4 days ago. Some nasal congestion, non-prod cough returned at that time. No fever. No sick contacts. Spiriva + SABA.   ROV 12/14/13 -- COPD, s/p LUL lobectomy 12/17/11 (Dr Roxan Hockey) for adeno CA. Nodules stable last CT 10/13/12. On last visit her cough was flaring and we restarted fluticasone, gave steroid taper. She felt better and stopped nasal steroid and loratadine.   03/03/2014 Acute OV COPD, s/p LUL lobectomy 12/17/11 adeno CA Complains of c/o: scratchy throat / sore, cough non-produtive, wheezing and some sob x3 days. Heat /humidity are making her breathing worse.  No recent abx use or travel.  Patient denies any hemoptysis, orthopnea, PND, or leg swelling.Marland Kitchen Has been taking Claritin daily without much relief of her drainage.  ROS:  Constitutional:   No  weight loss, night sweats,  Fevers, chills, fatigue, or  lassitude.  HEENT:   No headaches,  Difficulty swallowing,  Tooth/dental problems, or  Sore throat,                No sneezing, itching, ear ache, + nasal congestion, post nasal drip,   CV:  No chest pain,  Orthopnea, PND, swelling in lower extremities, anasarca, dizziness, palpitations, syncope.   GI  No heartburn, indigestion, abdominal pain, nausea, vomiting, diarrhea, change in bowel habits, loss of appetite, bloody stools.   Resp:No chest wall deformity  Skin: no rash or lesions.  GU: no dysuria, change in color of urine, no urgency or frequency.  No flank pain, no hematuria   MS:  No joint pain or swelling.  No decreased range of motion.  No back pain.  Psych:  No change in mood or affect. No depression or anxiety.  No memory loss.       Objective:  GEN: A/Ox3;  pleasant , NAD, well nourished   HEENT:  [place]/AT,  EACs-clear, TMs-wnl, NOSE-clear drainage  THROAT-clear, no lesions, no postnasal drip or exudate noted.   NECK:  Supple w/ fair ROM; no JVD; normal carotid impulses w/o bruits; no thyromegaly or nodules palpated; no lymphadenopathy.  RESP  Clear  P & A; w/o, wheezes/ rales/ or rhonchi.  CARD:  RRR, no m/r/g  , no peripheral edema, pulses intact, no cyanosis or clubbing.  Musco: Warm bil, no deformities or joint swelling noted.   Neuro: alert, no focal deficits noted.    Skin: Warm, no lesions or rashes

## 2014-03-03 NOTE — Assessment & Plan Note (Signed)
Flare  xopenex neb x 1   Plan  Prednisone taper over next week.  Mucinex DM Twice daily As needed  Cough and congestion  Augmentin to have on hold if symptoms worsen with discolored mucus.  Follow up Dr. Lamonte Sakai  As planned and As needed   Please contact office for sooner follow up if symptoms do not improve or worsen or seek emergency care

## 2014-03-03 NOTE — Addendum Note (Signed)
Addended by: Parke Poisson E on: 03/03/2014 05:13 PM   Modules accepted: Orders

## 2014-03-03 NOTE — Patient Instructions (Signed)
Prednisone taper over next week.  Mucinex DM Twice daily As needed  Cough and congestion  Augmentin to have on hold if symptoms worsen with discolored mucus.  Follow up Dr. Lamonte Sakai  As planned and As needed   Please contact office for sooner follow up if symptoms do not improve or worsen or seek emergency care

## 2014-05-06 ENCOUNTER — Ambulatory Visit: Payer: BC Managed Care – PPO | Admitting: Emergency Medicine

## 2014-05-18 ENCOUNTER — Ambulatory Visit (INDEPENDENT_AMBULATORY_CARE_PROVIDER_SITE_OTHER): Payer: Medicare Other | Admitting: Emergency Medicine

## 2014-05-18 ENCOUNTER — Encounter: Payer: Self-pay | Admitting: Emergency Medicine

## 2014-05-18 VITALS — BP 102/64 | HR 80 | Ht 65.0 in | Wt 126.6 lb

## 2014-05-18 DIAGNOSIS — J309 Allergic rhinitis, unspecified: Secondary | ICD-10-CM | POA: Diagnosis not present

## 2014-05-18 DIAGNOSIS — J4489 Other specified chronic obstructive pulmonary disease: Secondary | ICD-10-CM | POA: Diagnosis not present

## 2014-05-18 DIAGNOSIS — C3492 Malignant neoplasm of unspecified part of left bronchus or lung: Secondary | ICD-10-CM

## 2014-05-18 DIAGNOSIS — C3490 Malignant neoplasm of unspecified part of unspecified bronchus or lung: Secondary | ICD-10-CM

## 2014-05-18 DIAGNOSIS — J449 Chronic obstructive pulmonary disease, unspecified: Secondary | ICD-10-CM | POA: Diagnosis not present

## 2014-05-18 NOTE — Patient Instructions (Signed)
Please restart your loratadine 10mg  daily You can use your fluticasone nasal spray, 2 sprays each side in the evening (1 hour before bed) Continue your spiriva daily Use albuterol as needed We will discuss getting the Prevnar-13 pneumonia shot next visit.  Follow with Dr Lamonte Sakai in 4 months or sooner if you have any problems.

## 2014-05-18 NOTE — Assessment & Plan Note (Signed)
followup planned with Dr Roxan Hockey

## 2014-05-18 NOTE — Assessment & Plan Note (Signed)
Will restart her loratadine and fluticasone in prep for the ragweed season.

## 2014-05-18 NOTE — Progress Notes (Signed)
Subjective:    Patient ID: Valerie Mosley, female    DOB: Oct 16, 1950, 63 y.o.   MRN: [id number] HPI 63 yo WF , active smoker,  seen for initial pulmonary consult during hospital stay for hemoptysis 07/29/11 >tx for presumed CAP   08/13/2011 [facility]  Admitted 11/12-11/19/12 for hemoptysis with underling presumed COPD and supsected CAP. CT chest showed 1. 1.1 cm left upper lobe mass, Patchy density in the right upper lobe and minimal patchy density in the right lower lobe  She underwent  FOB from 11/13, all blood was on the R (small apical nodule is on the left). No endobronchial lesion noted but RML and RLL were occluded w clot - path, bx's confirm no malignancy. Tx for  pneumonia (no organism specified) with clinical improvement with abx, discharged on Avelox .   She has a known hx of  left upper lobe nodule with prev. FOB  that was neg for per pt.    Since discharge she is feeling much better with decreased cough and congesiton . No further hemopytsis. She is trying to cut back on smoking.   Hosp F/U visit 08/30/11 -- 63 yo smoker with probable COPD, admitted 07/29/11 with hemoptysis and AE-COPD vs PNA. At CT scan showed a LUL 1.1 cm spiculated nodule. She was rx w abx and for AE COPD. No longer having any hemoptysis. CXR 11/29 showed improvement in RLL and RML PNA. Returns now for f/u.  She is down to 3 -4 cig a day. She has some exertional SOB, but not severe. She uses SABA 2x a week.   ROV 09/19/11 -- COPD/tobacco use, recent hosp for hemoptysis as above, LUL nodule on CT scan. Returns today after PFT, had repeat CT on 12/17 that shows stable spiculated LUL nodule (? Smaller). Cigarettes at 4 cig a day. Rare SABA use.   ROV 10/29/11 -- COPD, continued tobacco, LUL nodule (last CT 09/02/11). Breathing better since Spiriva started last time. Still smoking. Need to discuss CT and options for f/u - serial scans vs possible primary resection.   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 QUIT SMOKING!  Wearing O2, need to qualify. Has been tolerating Spiriva. Has some DOE when she hurries. Has not needed Ventolin.   ROV 04/20/12 -- COPD, s/p LUL lobectomy 12/17/11 (Dr Roxan Hockey) for adeno CA.  She has had a lot of ups and downs emotionally - sounds like depression; has been treated with xanax for some anxiety. She doesn't believe she can do her former activties - breathing and fatigue do influence this. She is on Spiriva, seems to be helping her. Has not needed SABA for over a month. No exacerbations. Her walking oximetry showed that she didn't need O2 anymore (after being d/c post-op). She has had 5 cigarettes since her last visit.   ROV 09/14/12 -- COPD, s/p LUL lobectomy 12/17/11 (Dr Roxan Hockey) for adeno CA.  She has a RLL nodule that enlarged from 96mm to 72mm on scan in 10/13, plan is for a repeat in January. She has been stable with regard to breathing. Remains on Spiriva. Has not needed ventolin. She is having some L rib cage occasional catches or pain w a deep breath.  ROV 03/17/13 -- COPD, s/p LUL lobectomy 12/17/11 (Dr Roxan Hockey) for adeno CA. We are following several nodules, stable on latest CT scans 10/27/12 and 02/23/13. She feels that her breathing is stable, although she does worse in the severe heat and humidity. No new cough, wheeze. She has had some HA last 24 hours. She took loratadine and improved. She is using albuterol rarely. No AE since last time.   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 We are following several nodules, stable on latest CT scans 10/27/12 and 02/23/13. She had a repeat scan 07/13/13 > stable. She was recently seen by Dr Joya Gaskins 08/05/13 for an AE, received abx, depo, nasal steroid, [place]. She is much better, back  to baseline. Not using the flonase anymore. Used the [employer] for 3 days. She is on spiriva. Stopped smoking in October '14. She coughs at night when her sinuses drain.   ROV 10/11/13 -- COPD, s/p LUL lobectomy 12/17/11 (Dr Roxan Hockey) for adeno CA. Nodules stable last CT 10/13/12.  She had been well until about 3-4 days ago. Some nasal congestion, non-prod cough returned at that time. No fever. No sick contacts. Spiriva + SABA.   ROV 12/14/13 -- COPD, s/p LUL lobectomy 12/17/11 (Dr Roxan Hockey) for adeno CA. Nodules stable last CT 10/13/12. On last visit her cough was flaring and we restarted fluticasone, gave steroid taper. She felt better and stopped nasal steroid and loratadine.   Acute OV COPD 03/03/14, s/p LUL lobectomy 12/17/11 adeno CA;  Complains of c/o: scratchy throat / sore, cough non-produtive, wheezing and some sob x3 days. Heat /humidity are making her breathing worse.  No recent abx use or travel.  Patient denies any hemoptysis, orthopnea, PND, or leg swelling.Marland Kitchen Has been taking Claritin daily without much relief of her drainage.  ROV 05/18/14 -- COPD, s/p LUL lobectomy 12/17/11 (Dr Roxan Hockey) for adeno CA. Following pulm nodules by CT with Dr Roxan Hockey. She was treated for an acute flare in June '15. She is improved, back to baseline. She is on Spiriva qd, SABA prn. Her next CT scan will be in October - follows w Dr Roxan Hockey. She is using fluticasone prn. She coughs at night       Objective:   Filed Vitals:   05/18/14 1139  BP: 102/64  Pulse: 80  Height: 5\' 5"  (1.651 m)  Weight: 126 lb 9.6 oz (57.425 kg)  SpO2: 94%    GEN: A/Ox3; pleasant , NAD, well nourished   HEENT:  [place]/AT,  EACs-clear, TMs-wnl, NOSE-clear drainage  THROAT-clear, no lesions, no postnasal drip or exudate noted.   NECK:  Supple w/ fair ROM; no JVD; normal carotid impulses w/o bruits; no thyromegaly or nodules palpated; no lymphadenopathy.  RESP  Clear  P & A; w/o, wheezes/ rales/ or rhonchi.  CARD:  RRR, no  m/r/g  , no peripheral edema, pulses intact, no cyanosis or clubbing.  Musco: Warm bil, no deformities or joint swelling noted.   Neuro: alert, no focal deficits noted.    Skin: Warm, no lesions or rashes   COPD (chronic obstructive pulmonary disease)  Doing well s/p recent flare in June.  - cotinue same regimen - discussed prevnar -13, will revisit next time'  Allergic rhinitis Will restart her loratadine and fluticasone in prep for the ragweed season.   Non-small cell lung cancer followup planned with Dr Roxan Hockey

## 2014-05-18 NOTE — Assessment & Plan Note (Signed)
  Doing well s/p recent flare in June.  - cotinue same regimen - discussed prevnar -13, will revisit next time'

## 2014-05-23 ENCOUNTER — Emergency Department (HOSPITAL_COMMUNITY)
Admission: EM | Admit: 2014-05-23 | Discharge: 2014-05-23 | Disposition: A | Discharge status: Home / Self Care | Payer: BC Managed Care – PPO | Attending: Emergency Medicine | Admitting: Emergency Medicine

## 2014-05-23 ENCOUNTER — Encounter (HOSPITAL_COMMUNITY): Payer: Self-pay | Admitting: Emergency Medicine

## 2014-05-23 ENCOUNTER — Emergency Department (HOSPITAL_COMMUNITY): Payer: BC Managed Care – PPO

## 2014-05-23 DIAGNOSIS — R61 Generalized hyperhidrosis: Secondary | ICD-10-CM | POA: Diagnosis not present

## 2014-05-23 DIAGNOSIS — S81009A Unspecified open wound, unspecified knee, initial encounter: Secondary | ICD-10-CM | POA: Diagnosis not present

## 2014-05-23 DIAGNOSIS — S81809A Unspecified open wound, unspecified lower leg, initial encounter: Secondary | ICD-10-CM | POA: Diagnosis not present

## 2014-05-23 DIAGNOSIS — Z8739 Personal history of other diseases of the musculoskeletal system and connective tissue: Secondary | ICD-10-CM | POA: Insufficient documentation

## 2014-05-23 DIAGNOSIS — Z87891 Personal history of nicotine dependence: Secondary | ICD-10-CM | POA: Diagnosis not present

## 2014-05-23 DIAGNOSIS — J449 Chronic obstructive pulmonary disease, unspecified: Secondary | ICD-10-CM | POA: Insufficient documentation

## 2014-05-23 DIAGNOSIS — Z79899 Other long term (current) drug therapy: Secondary | ICD-10-CM | POA: Diagnosis not present

## 2014-05-23 DIAGNOSIS — Z23 Encounter for immunization: Secondary | ICD-10-CM | POA: Diagnosis not present

## 2014-05-23 DIAGNOSIS — IMO0002 Reserved for concepts with insufficient information to code with codable children: Secondary | ICD-10-CM | POA: Diagnosis not present

## 2014-05-23 DIAGNOSIS — W268XXA Contact with other sharp object(s), not elsewhere classified, initial encounter: Secondary | ICD-10-CM | POA: Insufficient documentation

## 2014-05-23 DIAGNOSIS — I959 Hypotension, unspecified: Secondary | ICD-10-CM | POA: Insufficient documentation

## 2014-05-23 DIAGNOSIS — S81811A Laceration without foreign body, right lower leg, initial encounter: Secondary | ICD-10-CM

## 2014-05-23 DIAGNOSIS — Y9289 Other specified places as the place of occurrence of the external cause: Secondary | ICD-10-CM | POA: Diagnosis not present

## 2014-05-23 DIAGNOSIS — Z85118 Personal history of other malignant neoplasm of bronchus and lung: Secondary | ICD-10-CM | POA: Diagnosis not present

## 2014-05-23 DIAGNOSIS — S91009A Unspecified open wound, unspecified ankle, initial encounter: Principal | ICD-10-CM

## 2014-05-23 DIAGNOSIS — I10 Essential (primary) hypertension: Secondary | ICD-10-CM | POA: Diagnosis not present

## 2014-05-23 DIAGNOSIS — W01119A Fall on same level from slipping, tripping and stumbling with subsequent striking against unspecified sharp object, initial encounter: Secondary | ICD-10-CM | POA: Diagnosis not present

## 2014-05-23 DIAGNOSIS — Y9389 Activity, other specified: Secondary | ICD-10-CM | POA: Insufficient documentation

## 2014-05-23 DIAGNOSIS — J4489 Other specified chronic obstructive pulmonary disease: Secondary | ICD-10-CM | POA: Insufficient documentation

## 2014-05-23 DIAGNOSIS — Z902 Acquired absence of lung [part of]: Secondary | ICD-10-CM | POA: Insufficient documentation

## 2014-05-23 DIAGNOSIS — Z792 Long term (current) use of antibiotics: Secondary | ICD-10-CM | POA: Insufficient documentation

## 2014-05-23 MED ORDER — NAPROXEN 500 MG PO TABS: 500.0000 mg | ORAL_TABLET | Freq: Two times a day (BID) | ORAL | Status: DC | PRN

## 2014-05-23 MED ORDER — OXYCODONE-ACETAMINOPHEN 5-325 MG PO TABS: 1.0000 | ORAL_TABLET | Freq: Four times a day (QID) | ORAL | Status: DC | PRN

## 2014-05-23 MED ORDER — OXYCODONE-ACETAMINOPHEN 5-325 MG PO TABS
1.0000 | ORAL_TABLET | Freq: Once | ORAL | Status: AC
  Administered 2014-05-23: 1 via ORAL
  Filled 2014-05-23: qty 1

## 2014-05-23 MED ORDER — TETANUS-DIPHTH-ACELL PERTUSSIS 5-2.5-18.5 LF-MCG/0.5 IM SUSP
0.5000 mL | Freq: Once | INTRAMUSCULAR | Status: AC
  Administered 2014-05-23: 0.5 mL via INTRAMUSCULAR
  Filled 2014-05-23: qty 0.5

## 2014-05-23 MED ORDER — DOXYCYCLINE HYCLATE 100 MG PO CAPS: 100.0000 mg | ORAL_CAPSULE | Freq: Two times a day (BID) | ORAL | Status: DC

## 2014-05-23 NOTE — Discharge Instructions (Signed)
Keep wound and clean with mild soap and water. Keep area covered with a topical antibiotic ointment and bandage, keep bandage dry, and do not submerge in water for 24 hours. Ice and elevate for additional pain relief and swelling. Alternate between naprosyn and Tylenol or percocet for additional pain relief but don't drive while taking percocet. Follow up with your primary care doctor or the [facility] in approximately 7 days for wound recheck and suture removal. Monitor area for signs of infection to include, but not limited to: increasing pain, redness, drainage/pus, or swelling. Return to emergency department for emergent changing or worsening symptoms.   Laceration Care, Adult A laceration is a cut that goes through all layers of the skin. The cut goes into the tissue beneath the skin. HOME CARE For stitches (sutures) or staples:  Keep the cut clean and dry.  If you have a bandage (dressing), change it at least once a day. Change the bandage if it gets wet or dirty, or as told by your doctor.  Wash the cut with soap and water 2 times a day. Rinse the cut with water. Pat it dry with a clean towel.  Put a thin layer of medicated cream on the cut as told by your doctor.  You may shower after the first 24 hours. Do not soak the cut in water until the stitches are removed.  Only take medicines as told by your doctor.  Have your stitches or staples removed as told by your doctor. For skin adhesive strips:  Keep the cut clean and dry.  Do not get the strips wet. You may take a bath, but be careful to keep the cut dry.  If the cut gets wet, pat it dry with a clean towel.  The strips will fall off on their own. Do not remove the strips that are still stuck to the cut. For wound glue:  You may shower or take baths. Do not soak or scrub the cut. Do not swim. Avoid heavy sweating until the glue falls off on its own. After a shower or bath, pat the cut dry with a clean  towel.  Do not put medicine on your cut until the glue falls off.  If you have a bandage, do not put tape over the glue.  Avoid lots of sunlight or tanning lamps until the glue falls off. Put sunscreen on the cut for the first year to reduce your scar.  The glue will fall off on its own. Do not pick at the glue. You may need a tetanus shot if:  You cannot remember when you had your last tetanus shot.  You have never had a tetanus shot. If you need a tetanus shot and you choose not to have one, you may get tetanus. Sickness from tetanus can be serious. GET HELP RIGHT AWAY IF:   Your pain does not get better with medicine.  Your arm, hand, leg, or foot loses feeling (numbness) or changes color.  Your cut is bleeding.  Your joint feels weak, or you cannot use your joint.  You have painful lumps on your body.  Your cut is red, puffy (swollen), or painful.  You have a red line on the skin near the cut.  You have yellowish-white fluid (pus) coming from the cut.  You have a fever.  You have a bad smell coming from the cut or bandage.  Your cut breaks open before or after stitches are removed.  You  notice something coming out of the cut, such as wood or glass.  You cannot move a finger or toe. MAKE SURE YOU:   Understand these instructions.  Will watch your condition.  Will get help right away if you are not doing well or get worse. Document Released: 02/19/2008 Document Revised: 11/25/2011 Document Reviewed: 02/26/2011 [facility] Patient Information 2015 [place], [place]. This information is not intended to replace advice given to you by your health care provider. Make sure you discuss any questions you have with your health care provider.  Stitches, Staples, or Skin Adhesive Strips  Stitches (sutures), staples, and skin adhesive strips hold the skin together as it heals. They will usually be in place for 7 days or less. HOME CARE  Wash your hands with soap and water  before and after you touch your wound.  Only take medicine as told by your doctor.  Cover your wound only if your doctor told you to. Otherwise, leave it open to air.  Do not get your stitches wet or dirty. If they get dirty, dab them gently with a clean washcloth. Wet the washcloth with soapy water. Do not rub. Pat them dry gently.  Do not put medicine or medicated cream on your stitches unless your doctor told you to.  Do not take out your own stitches or staples. Skin adhesive strips will fall off by themselves.  Do not pick at the wound. Picking can cause an infection.  Do not miss your follow-up appointment.  If you have problems or questions, call your doctor. GET HELP RIGHT AWAY IF:   You have a temperature by mouth above 102 F (38.9 C), not controlled by medicine.  You have chills.  You have redness or pain around your stitches.  There is puffiness (swelling) around your stitches.  You notice fluid (drainage) from your stitches.  There is a bad smell coming from your wound. MAKE SURE YOU:  Understand these instructions.  Will watch your condition.  Will get help if you are not doing well or get worse. Document Released: 06/30/2009 Document Revised: 11/25/2011 Document Reviewed: 06/30/2009 [facility] Patient Information 2015 [place], [place]. This information is not intended to replace advice given to you by your health care provider. Make sure you discuss any questions you have with your health care provider.

## 2014-05-23 NOTE — ED Notes (Signed)
Declined W/C at D/C and was escorted to lobby by RN. 

## 2014-05-23 NOTE — ED Notes (Signed)
PA at bedside.

## 2014-05-23 NOTE — ED Provider Notes (Signed)
CSN: [id number]     Arrival date & time 05/23/14  1547 History   None    This chart was scribed for non-physician practitioner working with Orpah Greek, * by Forrestine Him, ED Scribe. This patient was seen in room TR06C/TR06C and the patient's care was started at 5:03 PM.  Chief Complaint  Patient presents with  . Extremity Laceration   Patient is a 63 y.o. female presenting with skin laceration. The history is provided by the patient. No language interpreter was used.  Laceration Location:  Leg Leg laceration location:  R lower leg Depth:  Through underlying tissue Quality: avulsion and jagged   Bleeding: controlled   Time since incident:  2 hours Laceration mechanism:  Fall Pain details:    Quality:  Sharp   Severity:  Moderate   Progression:  Unchanged Foreign body present:  No foreign bodies Relieved by:  None tried Worsened by:  Pressure Ineffective treatments:  None tried Tetanus status:  Unknown   HPI Comments: Valerie Mosley is a 63 y.o. female with a PMHx of HTN, COPD, and kyphoscoliosis who presents to the Emergency Department complaining of a laceration to the R leg sustained around 3 PM this afternoon. Pt states she was standing on a plastic crate and stepped on the edge resulting in her falling and cutting her leg against the crate. Pain is constant at this time. Currently she rates discomfort 7-8/10 and described as sharp. Pt states she elevated her leg and applied alcohol to the wound after falling. No known alleviating factors, but aggravated by pressure. Bleeding is controlled at this time. She denies any fever or chills. No weakness, loss of sensation, or paresthesia. No lightheadedness or dizziness. Able to ambulate after the injury. Denies head injury or LOC. Not currently on blood thinning medications. Pt unaware of Tetanus status.  Past Medical History  Diagnosis Date  . Hypertension   . Kyphoscoliosis   . COPD (chronic obstructive pulmonary  disease)   . Cough 07/29/11    started coughing up blood this am  . PONV (postoperative nausea and vomiting)   . Recurrent upper respiratory infection (URI)   . Cough   . Cancer     Stage IA non-small cell lung cancer, left upper lobectomy 12/2011   Past Surgical History  Procedure Laterality Date  . Urethra surgery    . Thyroid surgery  1980    1/2 thyroid removed - benign  . Bronchoscopy    . Lobectomy  12/17/2011    Procedure: LOBECTOMY;  Surgeon: [name], MD;  Location: [facility] OR;  Service: Thoracic;  Laterality: Left;  (L)VATS, WEDGE RESECTION, LEFT UPPER LOBECTOMY,  Multiple node biopsies   History reviewed. No pertinent family history. History  Substance Use Topics  . Smoking status: Former Smoker -- 0.50 packs/day for 40 years    Types: Cigarettes    Quit date: 12/16/2011  . Smokeless tobacco: Never Used  . Alcohol Use: Yes     Comment: 1-2 beers daily   OB History   Grav Para Term Preterm Abortions TAB SAB Ect Mult Living                 Review of Systems  Constitutional: Negative for fever and chills.  HENT: Negative for facial swelling.   Eyes: Negative for visual disturbance.  Cardiovascular: Negative for leg swelling.  Gastrointestinal: Negative for nausea.  Musculoskeletal: Positive for arthralgias. Negative for joint swelling and neck pain.  Skin: Positive for wound.  Neurological: Negative for dizziness, syncope, weakness, numbness and headaches.  Hematological: Does not bruise/bleed easily.  Psychiatric/Behavioral: Negative for confusion.  A complete 10 system review of systems was obtained and all systems are negative except as noted in the HPI and PMH.    Allergies  Review of patient's allergies indicates no known allergies.  Home Medications   Prior to Admission medications   Medication Sig Start Date End Date Taking? Authorizing Provider  acetaminophen (TYLENOL) 500 MG tablet Take 500 mg by mouth every 6 (six) hours as needed. For pain  or fever    Historical Provider, MD  acyclovir ointment (ZOVIRAX) 5 % Apply 1 application topically every 3 (three) hours as needed. Cold sores 09/14/12   Collene Gobble, MD  albuterol (VENTOLIN HFA) 108 (90 BASE) MCG/ACT inhaler Inhale 2 puffs into the lungs every 4 (four) hours as needed for wheezing or shortness of breath. For shortness of breath 03/03/14   Tammy S Parrett, NP  amLODipine (NORVASC) 10 MG tablet Take 10 mg by mouth daily.      Historical Provider, MD  fluticasone (FLONASE) 50 MCG/ACT nasal spray Place 2 sprays into both nostrils daily.    Historical Provider, MD  HYDROcodone-homatropine Judithe Modest) 5-1.5 MG/5ML syrup  11/03/13   Historical Provider, MD  loratadine (CLARITIN) 10 MG tablet Take 10 mg by mouth daily as needed. For allergies    Historical Provider, MD  penicillin v potassium (VEETID) 500 MG tablet Take 500 mg by mouth 3 (three) times daily as needed. For when she has a dental procedure done. 07/23/11   Historical Provider, MD  predniSONE (DELTASONE) 10 MG tablet 4 tabs for 2 days, then 3 tabs for 2 days, 2 tabs for 2 days, then 1 tab for 2 days, then stop 03/03/14   Tammy S Parrett, NP  sertraline (ZOLOFT) 50 MG tablet Take 50 mg by mouth at bedtime.    Historical Provider, MD  SPIRIVA HANDIHALER 18 MCG inhalation capsule inhale the contents of one capsule in the handihaler once daily    Collene Gobble, MD   Triage Vitals: BP 116/74  Pulse 84  Temp(Src) 97.5 F (36.4 C) (Oral)  Resp 18  Ht 5\' 5"  (1.651 m)  Wt 125 lb (56.7 kg)  BMI 20.80 kg/m2  SpO2 94%   Physical Exam  Nursing note and vitals reviewed. Constitutional: She is oriented to person, place, and time. Vital signs are normal. She appears well-developed and well-nourished. No distress.  VSS, NAD, pleasant and cooperative, thin appearing female  HENT:  Head: Normocephalic and atraumatic.  Mouth/Throat: Mucous membranes are normal.  Eyes: Conjunctivae and EOM are normal.  Neck: Normal range of motion.  Neck supple. No spinous process tenderness and no muscular tenderness present. No rigidity. Normal range of motion present.  Cardiovascular: Normal rate and intact distal pulses.   Distal pulses intact in all extremities  Pulmonary/Chest: Effort normal. No respiratory distress.  Abdominal: Normal appearance. She exhibits no distension.  Musculoskeletal: Normal range of motion.       Right lower leg: She exhibits tenderness and laceration.       Legs: Large flap defect to R shin, with avulsion of skin in a V shape, approx 4cm on each side of the V defect, exposing the tibia and underlying fascia without any tendon or muscle injury. Jagged edges, subcutaneous fat exposed. Flap partially blue at edge, very thin frail skin at point of flap. Large defect with tension. FROM intact in all joints of RLE,  sensation grossly intact in all extremities, strength 5/5 in all extremities. Distal pulses intact  Neurological: She is alert and oriented to person, place, and time. She has normal strength. No sensory deficit.  Skin: Skin is warm and dry. Laceration noted.     Large V-shaped laceration to RLE over tibial surface, exposing underlying tissues, with slight cyanosis to flap edge.   Psychiatric: She has a normal mood and affect.    ED Course  LACERATION REPAIR Date/Time: 05/24/2014 8:50 PM Performed by: Shann Medal, Malaak Stach STRUPP Authorized by: Corine Shelter Consent: Verbal consent obtained. Risks and benefits: risks, benefits and alternatives were discussed Consent given by: patient Patient understanding: patient states understanding of the procedure being performed Patient consent: the patient's understanding of the procedure matches consent given Patient identity confirmed: verbally with patient Body area: lower extremity Location details: right lower leg Wound length (cm): V shaped flap, approx 4cm in one direction and 3cm in the other. Foreign bodies: no foreign  bodies Tendon involvement: none Nerve involvement: none Vascular damage: no Anesthesia: local infiltration Local anesthetic: lidocaine 2% with epinephrine Anesthetic total: 7 ml Patient sedated: no Preparation: Patient was prepped and draped in the usual sterile fashion. Irrigation solution: saline Irrigation method: jet lavage and syringe Amount of cleaning: extensive Debridement: none Degree of undermining: none Skin closure: 4-0 Prolene Number of sutures: 19 Technique: horizontal mattress, simple and vertical mattress Approximation: close Approximation difficulty: complex Dressing: 4x4 sterile gauze Patient tolerance: Patient tolerated the procedure well with no immediate complications. Comments: Combination of vertical mattress, horizontal mattress, and simple sutures used in order to close gaping defect and decrease tension on wound edges. Irrigation with NS and jet lavage revealing no FB. Entire procedure lasting 65 minutes. Pt became hypotensive and diaphoretic during procedure secondary to vasovagal response, at which time VS were obtained and pt placed in trendelenburg, with good response immediately. Nursing staff remained in the room and repeated VS until BP returned to prior readings. Pt was awake and alert throughout the procedure and felt better soon after being placed in trendelenberg   (including critical care time)  DIAGNOSTIC STUDIES: Oxygen Saturation is 94% on RA, adequate by my interpretation.    COORDINATION OF CARE: 5:03 PM- Will perform laceration repair. Will prescribe Doxycycline and pain medication at discharge. Apply ice to area for 20 minutes at a time every hour. Advised pt to try OTC medications to manage symptoms along with prescribed pain medication. Made pt aware of sign of infection and to return if noted symptoms. Discussed treatment plan with pt at bedside and pt agreed to plan.     Labs Review Labs Reviewed - No data to display  Imaging  Review Dg Tibia/fibula Right  05/23/2014   CLINICAL DATA:  Laceration to anterior midshaft of tibia  EXAM: RIGHT TIBIA AND FIBULA - 2 VIEW  COMPARISON:  None.  FINDINGS: No fracture or dislocation is seen.  The joint spaces are preserved.  Soft tissue irregularity/laceration along the anterior aspect of the mid tibia.  No radiopaque foreign body is seen.  IMPRESSION: Soft tissue irregularity/laceration along the anterior aspect of the mid tibia.  No fracture, dislocation, or radiopaque foreign body is seen.   Electronically Signed   By: Julian Hy M.D.   On: 05/23/2014 19:43     EKG Interpretation None      MDM   Final diagnoses:  Laceration of lower extremity, right, initial encounter  Fall against sharp object, initial encounter    62y/o female  presenting to the ED s/p fall against plastic crate, with flap laceration extending to tibia, no nerve or tendon involvement, no muscle injury. Xray revealing no FB or bony injuries. Able to close with 19 sutures, irrigated well. Pain meds given during ED stay, tetanus updated. Repair taking >1hr due to complexity, as well as pt having vasovagal hypotensive episode which resolved when placed in trendelenberg. Good approximation of wound edges, although edge of flap appears to have some blue tinting of skin, especially where flap becomes very thin. Discussed proper wound care, given pain meds and doxycycline to cover for any possible infections given the depth of the wound and exposure of bone. Will have pt f/up with PCP in 7-10 days for suture removal. I explained the diagnosis and have given explicit precautions to return to the ER including for any other new or worsening symptoms. The patient understands and accepts the medical plan as it's been dictated and I have answered their questions. Discharge instructions concerning home care and prescriptions have been given. The patient is STABLE and is discharged to home in good condition.   I  personally performed the services described in this documentation, which was scribed in my presence. The recorded information has been reviewed and is accurate.   BP 120/71  Pulse 92  Temp(Src) 97.8 F (36.6 C) (Oral)  Resp 12  Ht 5\' 5"  (1.651 m)  Wt 125 lb (56.7 kg)  BMI 20.80 kg/m2  SpO2 92%  Meds ordered this encounter  Medications  . oxyCODONE-acetaminophen (PERCOCET/ROXICET) 5-325 MG per tablet 1 tablet    Sig:   . oxyCODONE-acetaminophen (PERCOCET/ROXICET) 5-325 MG per tablet 1 tablet    Sig:   . Tdap (BOOSTRIX) injection 0.5 mL    Sig:   . oxyCODONE-acetaminophen (PERCOCET) 5-325 MG per tablet    Sig: Take 1-2 tablets by mouth every 6 (six) hours as needed for severe pain.    Dispense:  20 tablet    Refill:  0    Order Specific Question:  Supervising Provider    Answer:  Noemi Chapel D [[id number]]  . naproxen (NAPROSYN) 500 MG tablet    Sig: Take 1 tablet (500 mg total) by mouth 2 (two) times daily as needed for mild pain, moderate pain or headache (TAKE WITH MEALS.).    Dispense:  20 tablet    Refill:  0    Order Specific Question:  Supervising Provider    Answer:  Noemi Chapel D [[id number]]  . doxycycline (VIBRAMYCIN) 100 MG capsule    Sig: Take 1 capsule (100 mg total) by mouth 2 (two) times daily. One po bid x 7 days    Dispense:  14 capsule    Refill:  0    Order Specific Question:  Supervising Provider    Answer:  Noemi Chapel D [[id number]]     Ruba Outen Strupp Camprubi-Soms, PA-C 05/24/14 0425

## 2014-05-23 NOTE — ED Notes (Signed)
Pt reports that she tripped and hit her right leg on a plastic crate. Laceration to the area, unknown tetanus. Bleeding controlled.

## 2014-05-24 NOTE — ED Provider Notes (Signed)
Medical screening examination/treatment/procedure(s) were conducted as a shared visit with non-physician practitioner(s) and myself.  I personally evaluated the patient during the encounter.   EKG Interpretation None     Laceration to leg requiring sutures. I evaluated the laceration and determined that it would require complex repair due to depth and size. Repair performed by PA, see separate note.   Orpah Greek, MD 05/24/14 [id number]

## 2014-05-31 ENCOUNTER — Other Ambulatory Visit: Payer: Self-pay | Admitting: *Deleted

## 2014-05-31 DIAGNOSIS — C3412 Malignant neoplasm of upper lobe, left bronchus or lung: Secondary | ICD-10-CM

## 2014-06-06 DIAGNOSIS — M25579 Pain in unspecified ankle and joints of unspecified foot: Secondary | ICD-10-CM | POA: Diagnosis not present

## 2014-06-13 DIAGNOSIS — H52209 Unspecified astigmatism, unspecified eye: Secondary | ICD-10-CM | POA: Diagnosis not present

## 2014-06-13 DIAGNOSIS — H18519 Endothelial corneal dystrophy, unspecified eye: Secondary | ICD-10-CM | POA: Diagnosis not present

## 2014-06-13 DIAGNOSIS — H259 Unspecified age-related cataract: Secondary | ICD-10-CM | POA: Diagnosis not present

## 2014-06-28 ENCOUNTER — Ambulatory Visit
Admission: RE | Admit: 2014-06-28 | Discharge: 2014-06-28 | Disposition: A | Discharge status: Home / Self Care | Payer: BC Managed Care – PPO | Source: Ambulatory Visit | Attending: Thoracic Surgery (Cardiothoracic Vascular Surgery) | Admitting: Thoracic Surgery (Cardiothoracic Vascular Surgery)

## 2014-06-28 ENCOUNTER — Encounter: Payer: Self-pay | Admitting: Thoracic Surgery (Cardiothoracic Vascular Surgery)

## 2014-06-28 ENCOUNTER — Encounter (INDEPENDENT_AMBULATORY_CARE_PROVIDER_SITE_OTHER): Payer: Self-pay

## 2014-06-28 ENCOUNTER — Ambulatory Visit (INDEPENDENT_AMBULATORY_CARE_PROVIDER_SITE_OTHER): Payer: Medicare Other | Admitting: Thoracic Surgery (Cardiothoracic Vascular Surgery)

## 2014-06-28 VITALS — BP 114/77 | HR 96 | Ht 65.0 in | Wt 128.0 lb

## 2014-06-28 DIAGNOSIS — R918 Other nonspecific abnormal finding of lung field: Secondary | ICD-10-CM | POA: Diagnosis not present

## 2014-06-28 DIAGNOSIS — C3412 Malignant neoplasm of upper lobe, left bronchus or lung: Secondary | ICD-10-CM

## 2014-06-28 DIAGNOSIS — Z85118 Personal history of other malignant neoplasm of bronchus and lung: Secondary | ICD-10-CM | POA: Diagnosis not present

## 2014-06-28 DIAGNOSIS — J439 Emphysema, unspecified: Secondary | ICD-10-CM | POA: Diagnosis not present

## 2014-06-28 NOTE — Progress Notes (Signed)
HPI:  Valerie Mosley returns today for a scheduled 6 month followup visit.  She is a 63 year old woman who had a thoracoscopic left upper lobectomy in April of 2013 for a stage IA non-small cell carcinoma. She was last in the office in April of this year for her 2 year followup visit. She had no evidence of recurrent disease. Her CT scan did show 3 small nodules in the right lung which are unchanged since October 2013.  Says that she's been feeling well. She has not had any major issues with her COPD recently. She saw Dr. Lamonte Sakai recently and was started on Claritin. Her weight has been stable. She has an occasional cough, no hemoptysis. She's not had any unusual headaches or visual changes.  Past Medical History  Diagnosis Date  . Hypertension   . Kyphoscoliosis   . COPD (chronic obstructive pulmonary disease)   . Cough 07/29/11    started coughing up blood this am  . PONV (postoperative nausea and vomiting)   . Recurrent upper respiratory infection (URI)   . Cough   . Cancer     Stage IA non-small cell lung cancer, left upper lobectomy 12/2011     Current Outpatient Prescriptions  Medication Sig Dispense Refill  . amLODipine (NORVASC) 10 MG tablet Take 10 mg by mouth daily.        Marland Kitchen loratadine (CLARITIN) 10 MG tablet Take 10 mg by mouth daily as needed for allergies. For allergies      . sertraline (ZOLOFT) 25 MG tablet Take 25 mg by mouth at bedtime.      Marland Kitchen SPIRIVA HANDIHALER 18 MCG inhalation capsule inhale the contents of one capsule in the handihaler once daily  30 capsule  11  . acetaminophen (TYLENOL) 500 MG tablet Take 500 mg by mouth every 6 (six) hours as needed. For pain or fever      . albuterol (VENTOLIN HFA) 108 (90 BASE) MCG/ACT inhaler Inhale 2 puffs into the lungs every 4 (four) hours as needed for wheezing or shortness of breath. For shortness of breath  8.51 g  5   No current facility-administered medications for this visit.    Physical Exam BP 114/77  Pulse 96   Ht 5\' 5"  (1.651 m)  Wt 128 lb (58.06 kg)  BMI 21.30 kg/m2  SpO72 66%  63 year old woman in no acute distress Well-developed and well-nourished No cervical or subclavicular adenopathy Cardiac regular rate and rhythm normal S1 and S2 Lungs  diminished but equal breath sounds bilaterally  Diagnostic Tests: CT chest 06/28/2014  CT CHEST WITHOUT CONTRAST  TECHNIQUE:  Multidetector CT imaging of the chest was performed following the  standard protocol without IV contrast.  COMPARISON: 12/28/2013  FINDINGS:  Chest wall: No breast masses, supraclavicular or axillary  lymphadenopathy. The thyroid gland appears stable. The bony thorax  is intact. Stable exaggerated thoracic kyphosis. No worrisome bone  lesions or spinal canal compromise.  Mediastinum: The heart is normal in size. No pericardial effusion.  No mediastinal or hilar mass or adenopathy. Small scattered lymph  nodes are stable. The aorta is normal in caliber. Stable coronary  artery calcifications. The esophagus is grossly normal and stable.  Lungs: Stable advanced emphysematous changes. Stable surgical  changes involving the left upper lobe. There are 3 or 4 small stable  nodular densities in the right upper lobe. These are likely lymph  nodes. No new or worrisome pulmonary nodules. No pleural effusion.  Stable scarring changes at the left lung base.  Upper abdomen: Stable hepatic cysts. Left renal calculus is  unchanged.  IMPRESSION:  1. Stable advanced emphysematous changes but no acute overlying  pulmonary process.  2. Stable right upper lobe lung nodules. No new or worrisome lung  lesions.  3. No mediastinal or hilar mass or adenopathy.  Electronically Signed  By: Kalman Jewels M.D.  On: 06/28/2014 09:49   Impression:  63 year old woman who is now 2-1/2 years post thoracoscopic left upper lobectomy for a stage IA non-small cell carcinoma. She has no evidence recurrent disease.  She does have 3 small nodules in the  right lung. These are unchanged.  Plan:   return in 6 months with CT of chest for 3 year followup

## 2014-07-05 ENCOUNTER — Ambulatory Visit: Payer: BC Managed Care – PPO | Admitting: Thoracic Surgery (Cardiothoracic Vascular Surgery)

## 2014-07-06 DIAGNOSIS — Z1231 Encounter for screening mammogram for malignant neoplasm of breast: Secondary | ICD-10-CM | POA: Diagnosis not present

## 2014-07-06 DIAGNOSIS — Z0000 Encounter for general adult medical examination without abnormal findings: Secondary | ICD-10-CM | POA: Diagnosis not present

## 2014-07-06 DIAGNOSIS — R312 Other microscopic hematuria: Secondary | ICD-10-CM | POA: Diagnosis not present

## 2014-07-06 DIAGNOSIS — Z124 Encounter for screening for malignant neoplasm of cervix: Secondary | ICD-10-CM | POA: Diagnosis not present

## 2014-07-06 DIAGNOSIS — Z01419 Encounter for gynecological examination (general) (routine) without abnormal findings: Secondary | ICD-10-CM | POA: Diagnosis not present

## 2014-07-18 DIAGNOSIS — T148 Other injury of unspecified body region: Secondary | ICD-10-CM | POA: Diagnosis not present

## 2014-07-18 DIAGNOSIS — M25561 Pain in right knee: Secondary | ICD-10-CM | POA: Diagnosis not present

## 2014-08-09 DIAGNOSIS — H25012 Cortical age-related cataract, left eye: Secondary | ICD-10-CM | POA: Diagnosis not present

## 2014-08-09 DIAGNOSIS — H25032 Anterior subcapsular polar age-related cataract, left eye: Secondary | ICD-10-CM | POA: Diagnosis not present

## 2014-08-09 DIAGNOSIS — H1851 Endothelial corneal dystrophy: Secondary | ICD-10-CM | POA: Diagnosis not present

## 2014-08-09 DIAGNOSIS — H2512 Age-related nuclear cataract, left eye: Secondary | ICD-10-CM | POA: Diagnosis not present

## 2014-09-16 HISTORY — PX: CATARACT EXTRACTION, BILATERAL: SHX1313

## 2014-10-18 DIAGNOSIS — H1851 Endothelial corneal dystrophy: Secondary | ICD-10-CM | POA: Diagnosis not present

## 2014-10-18 DIAGNOSIS — H25011 Cortical age-related cataract, right eye: Secondary | ICD-10-CM | POA: Diagnosis not present

## 2014-10-18 DIAGNOSIS — H2511 Age-related nuclear cataract, right eye: Secondary | ICD-10-CM | POA: Diagnosis not present

## 2014-10-18 DIAGNOSIS — H25811 Combined forms of age-related cataract, right eye: Secondary | ICD-10-CM | POA: Diagnosis not present

## 2014-11-30 ENCOUNTER — Other Ambulatory Visit: Payer: Self-pay | Admitting: *Deleted

## 2014-11-30 DIAGNOSIS — C3412 Malignant neoplasm of upper lobe, left bronchus or lung: Secondary | ICD-10-CM

## 2014-12-05 DIAGNOSIS — C3490 Malignant neoplasm of unspecified part of unspecified bronchus or lung: Secondary | ICD-10-CM | POA: Diagnosis not present

## 2014-12-05 DIAGNOSIS — Z1389 Encounter for screening for other disorder: Secondary | ICD-10-CM | POA: Diagnosis not present

## 2014-12-05 DIAGNOSIS — Z0000 Encounter for general adult medical examination without abnormal findings: Secondary | ICD-10-CM | POA: Diagnosis not present

## 2014-12-05 DIAGNOSIS — E785 Hyperlipidemia, unspecified: Secondary | ICD-10-CM | POA: Diagnosis not present

## 2014-12-05 DIAGNOSIS — F329 Major depressive disorder, single episode, unspecified: Secondary | ICD-10-CM | POA: Diagnosis not present

## 2014-12-05 DIAGNOSIS — I10 Essential (primary) hypertension: Secondary | ICD-10-CM | POA: Diagnosis not present

## 2014-12-05 DIAGNOSIS — Z1211 Encounter for screening for malignant neoplasm of colon: Secondary | ICD-10-CM | POA: Diagnosis not present

## 2014-12-05 DIAGNOSIS — J449 Chronic obstructive pulmonary disease, unspecified: Secondary | ICD-10-CM | POA: Diagnosis not present

## 2014-12-07 DIAGNOSIS — M533 Sacrococcygeal disorders, not elsewhere classified: Secondary | ICD-10-CM | POA: Diagnosis not present

## 2014-12-27 ENCOUNTER — Ambulatory Visit (INDEPENDENT_AMBULATORY_CARE_PROVIDER_SITE_OTHER): Payer: Medicare Other | Admitting: Thoracic Surgery (Cardiothoracic Vascular Surgery)

## 2014-12-27 ENCOUNTER — Ambulatory Visit
Admission: RE | Admit: 2014-12-27 | Discharge: 2014-12-27 | Disposition: A | Discharge status: Home / Self Care | Payer: Medicare Other | Source: Ambulatory Visit | Attending: Thoracic Surgery (Cardiothoracic Vascular Surgery) | Admitting: Thoracic Surgery (Cardiothoracic Vascular Surgery)

## 2014-12-27 ENCOUNTER — Encounter: Payer: Self-pay | Admitting: Thoracic Surgery (Cardiothoracic Vascular Surgery)

## 2014-12-27 VITALS — BP 123/86 | HR 96 | Resp 20 | Ht 65.0 in | Wt 130.0 lb

## 2014-12-27 DIAGNOSIS — Z85118 Personal history of other malignant neoplasm of bronchus and lung: Secondary | ICD-10-CM | POA: Diagnosis not present

## 2014-12-27 DIAGNOSIS — Z9889 Other specified postprocedural states: Secondary | ICD-10-CM

## 2014-12-27 DIAGNOSIS — Z902 Acquired absence of lung [part of]: Secondary | ICD-10-CM

## 2014-12-27 DIAGNOSIS — C3412 Malignant neoplasm of upper lobe, left bronchus or lung: Secondary | ICD-10-CM

## 2014-12-27 DIAGNOSIS — R918 Other nonspecific abnormal finding of lung field: Secondary | ICD-10-CM

## 2014-12-27 DIAGNOSIS — J432 Centrilobular emphysema: Secondary | ICD-10-CM | POA: Diagnosis not present

## 2014-12-27 NOTE — Progress Notes (Signed)
[address]       [place] [address]             [phone]       HPI:  [name] returns for a scheduled follow-up visit.  She is a 64 year old woman who had a thoracoscopic left upper lobectomy in April 2013 for stage IA non-small cell carcinoma. She has been followed with CT scans every 6 months since that time. She does have multiple small right lung nodules which have been stable over time.  She says that she's been doing well since her last office visit in October 2015. She did have bilateral cataract surgery. She has not had any respiratory issues. She denies any unusual bone or joint pain or headaches or visual changes. Her weight is stable and her appetite is good.  Past Medical History  Diagnosis Date  . Hypertension   . Kyphoscoliosis   . COPD (chronic obstructive pulmonary disease)   . Cough 07/29/11    started coughing up blood this am  . PONV (postoperative nausea and vomiting)   . Recurrent upper respiratory infection (URI)   . Cough   . Cancer     Stage IA non-small cell lung cancer, left upper lobectomy 12/2011      Current Outpatient Prescriptions  Medication Sig Dispense Refill  . acetaminophen (TYLENOL) 500 MG tablet Take 500 mg by mouth every 6 (six) hours as needed. For pain or fever    . albuterol (VENTOLIN HFA) 108 (90 BASE) MCG/ACT inhaler Inhale 2 puffs into the lungs every 4 (four) hours as needed for wheezing or shortness of breath. For shortness of breath 8.51 g 5  . amLODipine (NORVASC) 10 MG tablet Take 10 mg by mouth daily.      Marland Kitchen loratadine (CLARITIN) 10 MG tablet Take 10 mg by mouth daily as needed for allergies. For allergies    . sertraline (ZOLOFT) 25 MG tablet Take 25 mg by mouth at bedtime.    Marland Kitchen SPIRIVA HANDIHALER 18 MCG inhalation capsule inhale the contents of one capsule in the handihaler once daily 30 capsule 11   No current facility-administered medications for this visit.    Physical Exam BP 123/86  mmHg  Pulse 96  Resp 20  Ht 5\' 5"  (1.651 m)  Wt 130 lb (58.968 kg)  BMI 21.63 kg/m2  SpO78 34% 64 year old woman in no acute distress Well-developed well-nourished Alert and oriented 3 with no focal neurologic deficits No cervical or supraclavicular adenopathy Cardiac regular rate and rhythm normal S1 and S2 Lungs with diminished breath sounds at left base, otherwise clear  Diagnostic Tests: CT chest IMPRESSION: 1. Stable right upper lobe pulmonary nodules. 2. Stable postsurgical change in the left hemi thorax. 3. Stable nodule over the left hemidiaphragm. 4. No new pulmonary nodularity or evidence of lung cancer recurrence. 5. Emphysematous change within the lungs.   Impression: Valerie Mosley is a 64 year old woman who is now 3 years out from a thoracoscopic left upper lobectomy for stage IA non-small cell carcinoma. She has no evidence of recurrent disease. She is doing well.  She does have multiple small lung nodules in the right lung is been stable over the past 3 years as well as a small nodule in the diaphragm the left lung that has been stable as well. At this point I think we can go to annual CT scans of follow-up visits out to 5 years.  Plan: Return in one year with CT  of chest  [name], MD [facility] [phone]

## 2015-01-03 ENCOUNTER — Ambulatory Visit (INDEPENDENT_AMBULATORY_CARE_PROVIDER_SITE_OTHER): Payer: Medicare Other | Admitting: Emergency Medicine

## 2015-01-03 ENCOUNTER — Encounter: Payer: Self-pay | Admitting: Emergency Medicine

## 2015-01-03 VITALS — BP 124/80 | HR 87 | Ht 64.0 in | Wt 129.0 lb

## 2015-01-03 DIAGNOSIS — J449 Chronic obstructive pulmonary disease, unspecified: Secondary | ICD-10-CM

## 2015-01-03 DIAGNOSIS — Z23 Encounter for immunization: Secondary | ICD-10-CM

## 2015-01-03 DIAGNOSIS — C3492 Malignant neoplasm of unspecified part of left bronchus or lung: Secondary | ICD-10-CM

## 2015-01-03 NOTE — Progress Notes (Signed)
Subjective:    Patient ID: Valerie Mosley, female    DOB: Jan 06, 1951, 64 y.o.   MRN: [id number] HPI 64 yo WF , active smoker,  seen for initial pulmonary consult during hospital stay for hemoptysis 07/29/11 >tx for presumed CAP   08/13/2011 [facility]  Admitted 11/12-11/19/12 for hemoptysis with underling presumed COPD and supsected CAP. CT chest showed 1. 1.1 cm left upper lobe mass, Patchy density in the right upper lobe and minimal patchy density in the right lower lobe  She underwent  FOB from 11/13, all blood was on the R (small apical nodule is on the left). No endobronchial lesion noted but RML and RLL were occluded w clot - path, bx's confirm no malignancy. Tx for  pneumonia (no organism specified) with clinical improvement with abx, discharged on Avelox .   She has a known hx of  left upper lobe nodule with prev. FOB  that was neg for per pt.    Since discharge she is feeling much better with decreased cough and congesiton . No further hemopytsis. She is trying to cut back on smoking.   Hosp F/U visit 08/30/11 -- 64 yo smoker with probable COPD, admitted 07/29/11 with hemoptysis and AE-COPD vs PNA. At CT scan showed a LUL 1.1 cm spiculated nodule. She was rx w abx and for AE COPD. No longer having any hemoptysis. CXR 11/29 showed improvement in RLL and RML PNA. Returns now for f/u.  She is down to 3 -4 cig a day. She has some exertional SOB, but not severe. She uses SABA 2x a week.   ROV 09/19/11 -- COPD/tobacco use, recent hosp for hemoptysis as above, LUL nodule on CT scan. Returns today after PFT, had repeat CT on 12/17 that shows stable spiculated LUL nodule (? Smaller). Cigarettes at 4 cig a day. Rare SABA use.   ROV 10/29/11 -- COPD, continued tobacco, LUL nodule (last CT 09/02/11). Breathing better since Spiriva started last time. Still smoking. Need to discuss CT and options for f/u - serial scans vs possible primary resection.   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 QUIT SMOKING!  Wearing O2, need to qualify. Has been tolerating Spiriva. Has some DOE when she hurries. Has not needed Ventolin.   ROV 04/20/12 -- COPD, s/p LUL lobectomy 12/17/11 (Dr Roxan Hockey) for adeno CA.  She has had a lot of ups and downs emotionally - sounds like depression; has been treated with xanax for some anxiety. She doesn't believe she can do her former activties - breathing and fatigue do influence this. She is on Spiriva, seems to be helping her. Has not needed SABA for over a month. No exacerbations. Her walking oximetry showed that she didn't need O2 anymore (after being d/c post-op). She has had 5 cigarettes since her last visit.   ROV 09/14/12 -- COPD, s/p LUL lobectomy 12/17/11 (Dr Roxan Hockey) for adeno CA.  She has a RLL nodule that enlarged from 10m to 98mon scan in 10/13, plan is for a repeat in January. She has been stable with regard to breathing. Remains on Spiriva. Has not needed ventolin. She is having some L rib cage occasional catches or pain w a deep breath.  ROV 03/17/13 -- COPD, s/p LUL lobectomy 12/17/11 (Dr Roxan Hockey) for adeno CA. We are following several nodules, stable on latest CT scans 10/27/12 and 02/23/13. She feels that her breathing is stable, although she does worse in the severe heat and humidity. No new cough, wheeze. She has had some HA last 24 hours. She took loratadine and improved. She is using albuterol rarely. No AE since last time.   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 We are following several nodules, stable on latest CT scans 10/27/12 and 02/23/13. She had a repeat scan 07/13/13 > stable. She was recently seen by Dr Joya Gaskins 08/05/13 for an AE, received abx, depo, nasal steroid, [place]. She is much better, back  to baseline. Not using the flonase anymore. Used the [employer] for 3 days. She is on spiriva. Stopped smoking in October '14. She coughs at night when her sinuses drain.   ROV 10/11/13 -- COPD, s/p LUL lobectomy 12/17/11 (Dr Roxan Hockey) for adeno CA. Nodules stable last CT 10/13/12.  She had been well until about 3-4 days ago. Some nasal congestion, non-prod cough returned at that time. No fever. No sick contacts. Spiriva + SABA.   ROV 12/14/13 -- COPD, s/p LUL lobectomy 12/17/11 (Dr Roxan Hockey) for adeno CA. Nodules stable last CT 10/13/12. On last visit her cough was flaring and we restarted fluticasone, gave steroid taper. She felt better and stopped nasal steroid and loratadine.   Acute OV COPD 03/03/14, s/p LUL lobectomy 12/17/11 adeno CA;  Complains of c/o: scratchy throat / sore, cough non-produtive, wheezing and some sob x3 days. Heat /humidity are making her breathing worse.  No recent abx use or travel.  Patient denies any hemoptysis, orthopnea, PND, or leg swelling.Marland Kitchen Has been taking Claritin daily without much relief of her drainage.  ROV 05/18/14 -- COPD, s/p LUL lobectomy 12/17/11 (Dr Roxan Hockey) for adeno CA. Following pulm nodules by CT with Dr Roxan Hockey. She was treated for an acute flare in June '15. She is improved, back to baseline. She is on Spiriva qd, SABA prn. Her next CT scan will be in October - follows w Dr Roxan Hockey. She is using fluticasone prn. She coughs at night   ROV 01/03/15 -- COPD, s/p LUL lobectomy 12/17/11 (Dr Roxan Hockey) for adeno CA. Following pulm nodules by CT with Dr Roxan Hockey, last scan was 12/27/14 > stable. She has undergone B cataract surgery since last time. She has been doing well, has been able to garden and walk her dog 3x a day. No flares since last time. She has had some cough in the night, especially in the Winter. She rarely uses albuterol. She is on loratadine qd, fluticasone nasal spray prn.      Objective:   Filed Vitals:   01/03/15 1038  BP: 124/80   Pulse: 87  Height: '5\' 4"'$  (1.626 m)  Weight: 129 lb (58.514 kg)  SpO2: 97%    GEN: A/Ox3; pleasant , NAD, well nourished   HEENT:  [place]/AT,  EACs-clear, TMs-wnl, NOSE-clear drainage  THROAT-clear, no lesions, no postnasal drip or exudate noted.   NECK:  Supple w/ fair ROM; no JVD; normal carotid impulses w/o bruits; no thyromegaly or nodules palpated; no lymphadenopathy.  RESP  Clear  P & A; w/o, wheezes/ rales/ or rhonchi.  CARD:  RRR, no m/r/g  , no peripheral edema, pulses intact, no cyanosis or clubbing.  Musco: Warm bil, no deformities or joint swelling noted.   Neuro: alert, no focal deficits noted.  Skin: Warm, no lesions or rashes   CT scan 12/27/14 --  IMPRESSION: 1. Stable right upper lobe pulmonary nodules. 2. Stable postsurgical change in the left hemi thorax. 3. Stable nodule over the left hemidiaphragm. 4. No new pulmonary nodularity or evidence of lung cancer recurrence. 5. Emphysematous change within the lungs.    COPD (chronic obstructive pulmonary disease) Stable at this time on her current medical regimen. She has not had any flares and rarely needs her albuterol. She has good exertional tolerance although she can get shortness of breath with significant exertion. She has dry mouth that is likely due to her anticholinergic. She needs to have the Prevnar today.      Non-small cell lung cancer Post recent CT scan was 12/27/14 and this showed no change in pulmonary nodularity. There's been no evidence or recurrence since her left upper lobectomy in 2013.

## 2015-01-03 NOTE — Addendum Note (Signed)
Addended by: Desmond Dike C on: 01/03/2015 11:05 AM   Modules accepted: Orders

## 2015-01-03 NOTE — Patient Instructions (Signed)
Please continue your Spiriva every day Use albuterol as needed. Keep this available in case of shortness of breath We will give you the Prevnar 13 immunization today Continue your loratadine every day Use fluticasone nasal spray as needed Follow with Dr Lamonte Sakai in 6 months or sooner if you have any problems

## 2015-01-03 NOTE — Assessment & Plan Note (Signed)
Post recent CT scan was 12/27/14 and this showed no change in pulmonary nodularity. There's been no evidence or recurrence since her left upper lobectomy in 2013.

## 2015-01-03 NOTE — Assessment & Plan Note (Signed)
Stable at this time on her current medical regimen. She has not had any flares and rarely needs her albuterol. She has good exertional tolerance although she can get shortness of breath with significant exertion. She has dry mouth that is likely due to her anticholinergic. She needs to have the Prevnar today.

## 2015-02-15 ENCOUNTER — Ambulatory Visit (INDEPENDENT_AMBULATORY_CARE_PROVIDER_SITE_OTHER): Payer: Medicare Other | Admitting: Emergency Medicine

## 2015-02-15 ENCOUNTER — Encounter: Payer: Self-pay | Admitting: Emergency Medicine

## 2015-02-15 VITALS — BP 110/88 | HR 89 | Ht 64.0 in | Wt 131.0 lb

## 2015-02-15 DIAGNOSIS — J209 Acute bronchitis, unspecified: Secondary | ICD-10-CM | POA: Diagnosis not present

## 2015-02-15 DIAGNOSIS — J309 Allergic rhinitis, unspecified: Secondary | ICD-10-CM | POA: Diagnosis not present

## 2015-02-15 DIAGNOSIS — J449 Chronic obstructive pulmonary disease, unspecified: Secondary | ICD-10-CM

## 2015-02-15 MED ORDER — DOXYCYCLINE HYCLATE 100 MG PO TABS: 100.0000 mg | ORAL_TABLET | Freq: Two times a day (BID) | ORAL | Status: DC

## 2015-02-15 MED ORDER — HYDROCODONE-HOMATROPINE 5-1.5 MG/5ML PO SYRP: 5.0000 mL | ORAL_SOLUTION | Freq: Four times a day (QID) | ORAL | Status: DC | PRN

## 2015-02-15 MED ORDER — FLUTICASONE PROPIONATE 50 MCG/ACT NA SUSP: 2.0000 | Freq: Every day | NASAL | Status: DC

## 2015-02-15 NOTE — Patient Instructions (Signed)
Please continue your inhaled medications as you have been taking them Please continue loratadine every day Start taking fluticasone nasal spray, 2 sprays each nostril every day until this illness has resolved. Then you can go back to using it as needed Start Mucinex (guaifenesin) 600 mg twice a day Use chlorpheniramine 4 mg up to every 6 hours if needed for congestion Take doxycycline 100 mg twice a day for 7 days until completely gone Follow with Dr Lamonte Sakai or Tammy Parrett in 2 weeks

## 2015-02-15 NOTE — Assessment & Plan Note (Signed)
Continue loratadine daily. I asked her to start using her fluticasone nasal spray every day instead of just when necessary at least until this illness has resolved

## 2015-02-15 NOTE — Progress Notes (Signed)
Subjective:    Patient ID: Valerie Mosley, female    DOB: March 22, 1951, 64 y.o.   MRN: [id number] HPI 64 yo WF , active smoker,  seen for initial pulmonary consult during hospital stay for hemoptysis 07/29/11 >tx for presumed CAP   08/13/2011 [facility]  Admitted 11/12-11/19/12 for hemoptysis with underling presumed COPD and supsected CAP. CT chest showed 1. 1.1 cm left upper lobe mass, Patchy density in the right upper lobe and minimal patchy density in the right lower lobe  She underwent  FOB from 11/13, all blood was on the R (small apical nodule is on the left). No endobronchial lesion noted but RML and RLL were occluded w clot - path, bx's confirm no malignancy. Tx for  pneumonia (no organism specified) with clinical improvement with abx, discharged on Avelox .   She has a known hx of  left upper lobe nodule with prev. FOB  that was neg for per pt.    Since discharge she is feeling much better with decreased cough and congesiton . No further hemopytsis. She is trying to cut back on smoking.   Hosp F/U visit 08/30/11 -- 64 yo smoker with probable COPD, admitted 07/29/11 with hemoptysis and AE-COPD vs PNA. At CT scan showed a LUL 1.1 cm spiculated nodule. She was rx w abx and for AE COPD. No longer having any hemoptysis. CXR 11/29 showed improvement in RLL and RML PNA. Returns now for f/u.  She is down to 3 -4 cig a day. She has some exertional SOB, but not severe. She uses SABA 2x a week.   ROV 09/19/11 -- COPD/tobacco use, recent hosp for hemoptysis as above, LUL nodule on CT scan. Returns today after PFT, had repeat CT on 12/17 that shows stable spiculated LUL nodule (? Smaller). Cigarettes at 4 cig a day. Rare SABA use.   ROV 10/29/11 -- COPD, continued tobacco, LUL nodule (last CT 09/02/11). Breathing better since Spiriva started last time. Still smoking. Need to discuss CT and options for f/u - serial scans vs possible primary resection.   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 QUIT SMOKING!  Wearing O2, need to qualify. Has been tolerating Spiriva. Has some DOE when she hurries. Has not needed Ventolin.   ROV 04/20/12 -- COPD, s/p LUL lobectomy 12/17/11 (Dr Roxan Hockey) for adeno CA.  She has had a lot of ups and downs emotionally - sounds like depression; has been treated with xanax for some anxiety. She doesn't believe she can do her former activties - breathing and fatigue do influence this. She is on Spiriva, seems to be helping her. Has not needed SABA for over a month. No exacerbations. Her walking oximetry showed that she didn't need O2 anymore (after being d/c post-op). She has had 5 cigarettes since her last visit.   ROV 09/14/12 -- COPD, s/p LUL lobectomy 12/17/11 (Dr Roxan Hockey) for adeno CA.  She has a RLL nodule that enlarged from 28m to 968mon scan in 10/13, plan is for a repeat in January. She has been stable with regard to breathing. Remains on Spiriva. Has not needed ventolin. She is having some L rib cage occasional catches or pain w a deep breath.  ROV 03/17/13 -- COPD, s/p LUL lobectomy 12/17/11 (Dr Roxan Hockey) for adeno CA. We are following several nodules, stable on latest CT scans 10/27/12 and 02/23/13. She feels that her breathing is stable, although she does worse in the severe heat and humidity. No new cough, wheeze. She has had some HA last 24 hours. She took loratadine and improved. She is using albuterol rarely. No AE since last time.   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 We are following several nodules, stable on latest CT scans 10/27/12 and 02/23/13. She had a repeat scan 07/13/13 > stable. She was recently seen by Dr Joya Gaskins 08/05/13 for an AE, received abx, depo, nasal steroid, [place]. She is much better, back  to baseline. Not using the flonase anymore. Used the [employer] for 3 days. She is on spiriva. Stopped smoking in October '14. She coughs at night when her sinuses drain.   ROV 10/11/13 -- COPD, s/p LUL lobectomy 12/17/11 (Dr Roxan Hockey) for adeno CA. Nodules stable last CT 10/13/12.  She had been well until about 3-4 days ago. Some nasal congestion, non-prod cough returned at that time. No fever. No sick contacts. Spiriva + SABA.   ROV 12/14/13 -- COPD, s/p LUL lobectomy 12/17/11 (Dr Roxan Hockey) for adeno CA. Nodules stable last CT 10/13/12. On last visit her cough was flaring and we restarted fluticasone, gave steroid taper. She felt better and stopped nasal steroid and loratadine.   Acute OV COPD 03/03/14, s/p LUL lobectomy 12/17/11 adeno CA;  Complains of c/o: scratchy throat / sore, cough non-produtive, wheezing and some sob x3 days. Heat /humidity are making her breathing worse.  No recent abx use or travel.  Patient denies any hemoptysis, orthopnea, PND, or leg swelling.Marland Kitchen Has been taking Claritin daily without much relief of her drainage.  ROV 05/18/14 -- COPD, s/p LUL lobectomy 12/17/11 (Dr Roxan Hockey) for adeno CA. Following pulm nodules by CT with Dr Roxan Hockey. She was treated for an acute flare in June '15. She is improved, back to baseline. She is on Spiriva qd, SABA prn. Her next CT scan will be in October - follows w Dr Roxan Hockey. She is using fluticasone prn. She coughs at night   ROV 01/03/15 -- COPD, s/p LUL lobectomy 12/17/11 (Dr Roxan Hockey) for adeno CA. Following pulm nodules by CT with Dr Roxan Hockey, last scan was 12/27/14 > stable. She has undergone B cataract surgery since last time. She has been doing well, has been able to garden and walk her dog 3x a day. No flares since last time. She has had some cough in the night, especially in the Winter. She rarely uses albuterol. She is on loratadine qd, fluticasone nasal spray prn.   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 I saw her last about 6 weeks ago.  She had been well until about 1 weeks ago - developed sore throat, some congestion, HA, cough. The cough is now productive of clear / cream. She has some dyspnea, some increased wheeze that is now a bit better. No known sick contacts.  She is using flonase prn, loratadine every day.     Objective:   Filed Vitals:   02/15/15 1355  BP: 110/88  Pulse: 89  Height: '5\' 4"'$  (1.626 m)  Weight: 131 lb (59.421 kg)  SpO2: 95%    GEN: A/Ox3; pleasant , NAD, well nourished   HEENT:  [place]/AT,  EACs-clear, TMs-wnl,  NOSE-clear drainage  THROAT-clear, no lesions, no postnasal drip or exudate noted.   NECK:  Supple w/ fair ROM; no JVD; normal carotid impulses w/o bruits; no thyromegaly or nodules palpated; no lymphadenopathy.  RESP  Clear  P & A; w/o, wheezes/ rales/ or rhonchi.  CARD:  RRR, no m/r/g  , no peripheral edema, pulses intact, no cyanosis or clubbing.  Musco: Warm bil, no deformities or joint swelling noted.   Neuro: alert, no focal deficits noted.    Skin: Warm, no lesions or rashes   CT scan 12/27/14 --  IMPRESSION: 1. Stable right upper lobe pulmonary nodules. 2. Stable postsurgical change in the left hemi thorax. 3. Stable nodule over the left hemidiaphragm. 4. No new pulmonary nodularity or evidence of lung cancer recurrence. 5. Emphysematous change within the lungs.    COPD (chronic obstructive pulmonary disease) She has an apparent acute bronchitis in the setting of a possible viral URI. She is not wheezing on exam today and I do not believe this is an exacerbation characterized by bronchospasm. For this reason I will hold off on prednisone but I explained to her that she could evolve wheezing or dyspnea and if so then she will need to be treated as such   Acute bronchitis She has symptoms consistent with an acute bacterial bronchitis probably following a viral URI. I like to treat  her with Mucinex and chlorpheniramine in addition to her usual allergy regimen. I will also treat her with doxycycline for one week. She knows to call if she is not improving. I'll have her follow-up in 2 weeks to assess her improvement   Allergic rhinitis Continue loratadine daily. I asked her to start using her fluticasone nasal spray every day instead of just when necessary at least until this illness has resolved

## 2015-02-15 NOTE — Addendum Note (Signed)
Addended by: Osa Craver on: 02/15/2015 02:21 PM   Modules accepted: Orders

## 2015-02-15 NOTE — Assessment & Plan Note (Signed)
She has symptoms consistent with an acute bacterial bronchitis probably following a viral URI. I like to treat her with Mucinex and chlorpheniramine in addition to her usual allergy regimen. I will also treat her with doxycycline for one week. She knows to call if she is not improving. I'll have her follow-up in 2 weeks to assess her improvement

## 2015-02-15 NOTE — Assessment & Plan Note (Signed)
She has an apparent acute bronchitis in the setting of a possible viral URI. She is not wheezing on exam today and I do not believe this is an exacerbation characterized by bronchospasm. For this reason I will hold off on prednisone but I explained to her that she could evolve wheezing or dyspnea and if so then she will need to be treated as such

## 2015-03-01 ENCOUNTER — Ambulatory Visit (INDEPENDENT_AMBULATORY_CARE_PROVIDER_SITE_OTHER): Payer: Medicare Other | Admitting: Adult Health

## 2015-03-01 ENCOUNTER — Ambulatory Visit (INDEPENDENT_AMBULATORY_CARE_PROVIDER_SITE_OTHER)
Admission: RE | Admit: 2015-03-01 | Discharge: 2015-03-01 | Disposition: A | Discharge status: Home / Self Care | Payer: Medicare Other | Source: Ambulatory Visit | Attending: Adult Health | Admitting: Adult Health

## 2015-03-01 ENCOUNTER — Encounter: Payer: Self-pay | Admitting: Adult Health

## 2015-03-01 VITALS — BP 122/74 | HR 77 | Temp 97.9°F | Ht 64.0 in | Wt 127.4 lb

## 2015-03-01 DIAGNOSIS — R05 Cough: Secondary | ICD-10-CM | POA: Diagnosis not present

## 2015-03-01 DIAGNOSIS — J449 Chronic obstructive pulmonary disease, unspecified: Secondary | ICD-10-CM

## 2015-03-01 MED ORDER — PREDNISONE 10 MG PO TABS: ORAL_TABLET | ORAL | Status: DC

## 2015-03-01 NOTE — Assessment & Plan Note (Signed)
Slow to resolve flare  Plan  Prednisone taper over next week.  Mucinex DM Twice daily As needed  Cough and congestion  Hydromet As needed   Chest xray today .   Follow up Dr. Lamonte Sakai  In 3 months and As needed   Please contact office for sooner follow up if symptoms do not improve or worsen or seek emergency care

## 2015-03-01 NOTE — Patient Instructions (Signed)
Prednisone taper over next week.  Mucinex DM Twice daily As needed  Cough and congestion  Hydromet As needed   Chest xray today .   Follow up Dr. Lamonte Sakai  In 3 months and As needed   Please contact office for sooner follow up if symptoms do not improve or worsen or seek emergency care

## 2015-03-01 NOTE — Progress Notes (Signed)
Subjective:    Patient ID: Valerie Mosley, female    DOB: 1951/04/20, 64 y.o.   MRN: [id number] HPI 64 yo WF , active smoker,  seen for initial pulmonary consult during hospital stay for hemoptysis 07/29/11 >tx for presumed CAP   08/13/2011 [facility]  Admitted 11/12-11/19/12 for hemoptysis with underling presumed COPD and supsected CAP. CT chest showed 1. 1.1 cm left upper lobe mass, Patchy density in the right upper lobe and minimal patchy density in the right lower lobe  She underwent  FOB from 11/13, all blood was on the R (small apical nodule is on the left). No endobronchial lesion noted but RML and RLL were occluded w clot - path, bx's confirm no malignancy. Tx for  pneumonia (no organism specified) with clinical improvement with abx, discharged on Avelox .   She has a known hx of  left upper lobe nodule with prev. FOB  that was neg for per pt.    Since discharge she is feeling much better with decreased cough and congesiton . No further hemopytsis. She is trying to cut back on smoking.   Hosp F/U visit 08/30/11 -- 64 yo smoker with probable COPD, admitted 07/29/11 with hemoptysis and AE-COPD vs PNA. At CT scan showed a LUL 1.1 cm spiculated nodule. She was rx w abx and for AE COPD. No longer having any hemoptysis. CXR 11/29 showed improvement in RLL and RML PNA. Returns now for f/u.  She is down to 3 -4 cig a day. She has some exertional SOB, but not severe. She uses SABA 2x a week.   ROV 09/19/11 -- COPD/tobacco use, recent hosp for hemoptysis as above, LUL nodule on CT scan. Returns today after PFT, had repeat CT on 12/17 that shows stable spiculated LUL nodule (? Smaller). Cigarettes at 4 cig a day. Rare SABA use.   ROV 10/29/11 -- COPD, continued tobacco, LUL nodule (last CT 09/02/11). Breathing better since Spiriva started last time. Still smoking. Need to discuss CT and options for f/u - serial scans vs possible primary resection.   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 QUIT SMOKING!  Wearing O2, need to qualify. Has been tolerating Spiriva. Has some DOE when she hurries. Has not needed Ventolin.   ROV 04/20/12 -- COPD, s/p LUL lobectomy 12/17/11 (Dr Roxan Hockey) for adeno CA.  She has had a lot of ups and downs emotionally - sounds like depression; has been treated with xanax for some anxiety. She doesn't believe she can do her former activties - breathing and fatigue do influence this. She is on Spiriva, seems to be helping her. Has not needed SABA for over a month. No exacerbations. Her walking oximetry showed that she didn't need O2 anymore (after being d/c post-op). She has had 5 cigarettes since her last visit.   ROV 09/14/12 -- COPD, s/p LUL lobectomy 12/17/11 (Dr Roxan Hockey) for adeno CA.  She has a RLL nodule that enlarged from 31m to 941mon scan in 10/13, plan is for a repeat in January. She has been stable with regard to breathing. Remains on Spiriva. Has not needed ventolin. She is having some L rib cage occasional catches or pain w a deep breath.  ROV 03/17/13 -- COPD, s/p LUL lobectomy 12/17/11 (Dr Roxan Hockey) for adeno CA. We are following several nodules, stable on latest CT scans 10/27/12 and 02/23/13. She feels that her breathing is stable, although she does worse in the severe heat and humidity. No new cough, wheeze. She has had some HA last 24 hours. She took loratadine and improved. She is using albuterol rarely. No AE since last time.   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 We are following several nodules, stable on latest CT scans 10/27/12 and 02/23/13. She had a repeat scan 07/13/13 > stable. She was recently seen by Dr Joya Gaskins 08/05/13 for an AE, received abx, depo, nasal steroid, [place]. She is much better, back  to baseline. Not using the flonase anymore. Used the [employer] for 3 days. She is on spiriva. Stopped smoking in October '14. She coughs at night when her sinuses drain.   ROV 10/11/13 -- COPD, s/p LUL lobectomy 12/17/11 (Dr Roxan Hockey) for adeno CA. Nodules stable last CT 10/13/12.  She had been well until about 3-4 days ago. Some nasal congestion, non-prod cough returned at that time. No fever. No sick contacts. Spiriva + SABA.   ROV 12/14/13 -- COPD, s/p LUL lobectomy 12/17/11 (Dr Roxan Hockey) for adeno CA. Nodules stable last CT 10/13/12. On last visit her cough was flaring and we restarted fluticasone, gave steroid taper. She felt better and stopped nasal steroid and loratadine.   Acute OV COPD 03/03/14, s/p LUL lobectomy 12/17/11 adeno CA;  Complains of c/o: scratchy throat / sore, cough non-produtive, wheezing and some sob x3 days. Heat /humidity are making her breathing worse.  No recent abx use or travel.  Patient denies any hemoptysis, orthopnea, PND, or leg swelling.Marland Kitchen Has been taking Claritin daily without much relief of her drainage.  ROV 05/18/14 -- COPD, s/p LUL lobectomy 12/17/11 (Dr Roxan Hockey) for adeno CA. Following pulm nodules by CT with Dr Roxan Hockey. She was treated for an acute flare in June '15. She is improved, back to baseline. She is on Spiriva qd, SABA prn. Her next CT scan will be in October - follows w Dr Roxan Hockey. She is using fluticasone prn. She coughs at night   ROV 01/03/15 -- COPD, s/p LUL lobectomy 12/17/11 (Dr Roxan Hockey) for adeno CA. Following pulm nodules by CT with Dr Roxan Hockey, last scan was 12/27/14 > stable. She has undergone B cataract surgery since last time. She has been doing well, has been able to garden and walk her dog 3x a day. No flares since last time. She has had some cough in the night, especially in the Winter. She rarely uses albuterol. She is on loratadine qd, fluticasone nasal spray prn.   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 I saw her last about 6 weeks ago.  She had been well until about 1 weeks ago - developed sore throat, some congestion, HA, cough. The cough is now productive of clear / cream. She has some dyspnea, some increased wheeze that is now a bit better. No known sick contacts.  She is using flonase prn, loratadine every day.  >>doxycycline rx    03/01/2015 Follow up : severe COPD and history of left upper lobe lobectomy for adenocarcinoma the lung. Patient returns for a two-week follow-up. Without a flare bronchitis. Last visit and given a one-week course of doxycycline. His last visit is feeling better. But still has lingering  cough. Has some rib pain along the sides and back. Denies any hemoptysis, chest pain, orthopnea, PND, or increased leg swelling.  Husband is sick with similar symptoms.     Objective:     GEN: A/Ox3; pleasant , NAD, elderly  HEENT:  [place]/AT,  EACs-clear, TMs-wnl, NOSE-clear drainage  THROAT-clear, no lesions, no postnasal drip or exudate noted.   NECK:  Supple w/ fair ROM; no JVD; normal carotid impulses w/o bruits; no thyromegaly or nodules palpated; no lymphadenopathy.  RESP  Clear  P & A; w/o, wheezes/ rales/ or rhonchi.  CARD:  RRR, no m/r/g  , no peripheral edema, pulses intact, no cyanosis or clubbing.  Musco: Warm bil, no deformities or joint swelling noted.   Neuro: alert, no focal deficits noted.    Skin: Warm, no lesions or rashes   CT scan 12/27/14 --  IMPRESSION: 1. Stable right upper lobe pulmonary nodules. 2. Stable postsurgical change in the left hemi thorax. 3. Stable nodule over the left hemidiaphragm. 4. No new pulmonary nodularity or evidence of lung cancer recurrence. 5. Emphysematous change within the lungs.

## 2015-03-03 NOTE — Progress Notes (Signed)
Quick Note:  LVM for pt to return call ______ 

## 2015-04-13 ENCOUNTER — Other Ambulatory Visit: Payer: Self-pay | Admitting: Emergency Medicine

## 2015-05-30 ENCOUNTER — Encounter: Payer: Self-pay | Admitting: Emergency Medicine

## 2015-05-30 ENCOUNTER — Ambulatory Visit (INDEPENDENT_AMBULATORY_CARE_PROVIDER_SITE_OTHER): Payer: Medicare Other | Admitting: Emergency Medicine

## 2015-05-30 VITALS — BP 120/74 | HR 88 | Ht 65.0 in | Wt 127.6 lb

## 2015-05-30 DIAGNOSIS — J449 Chronic obstructive pulmonary disease, unspecified: Secondary | ICD-10-CM

## 2015-05-30 DIAGNOSIS — Z23 Encounter for immunization: Secondary | ICD-10-CM

## 2015-05-30 DIAGNOSIS — J309 Allergic rhinitis, unspecified: Secondary | ICD-10-CM | POA: Diagnosis not present

## 2015-05-30 DIAGNOSIS — C3492 Malignant neoplasm of unspecified part of left bronchus or lung: Secondary | ICD-10-CM

## 2015-05-30 NOTE — Assessment & Plan Note (Signed)
We will continue Spiriva for now. If her albuterol use increases or her functional capacity decreases then we will consider change to a combination long-acting bronchodilator. We discussed using her albuterol preemptively before significant exercise

## 2015-05-30 NOTE — Assessment & Plan Note (Signed)
Most recent CT scan of the chest was in April and I have reviewed this. She has discussed with Dr. Roxan Hockey. No evidence of recurrence. She's been followed with annual scans

## 2015-05-30 NOTE — Progress Notes (Signed)
Subjective:    Patient ID: Valerie Mosley, female    DOB: 1950-12-20, 64 y.o.   MRN: [id number] HPI 64 yo WF , active smoker,  seen for initial pulmonary consult during hospital stay for hemoptysis 07/29/11 >tx for presumed CAP   08/13/2011 [facility]  Admitted 11/12-11/19/12 for hemoptysis with underling presumed COPD and supsected CAP. CT chest showed 1. 1.1 cm left upper lobe mass, Patchy density in the right upper lobe and minimal patchy density in the right lower lobe  She underwent  FOB from 11/13, all blood was on the R (small apical nodule is on the left). No endobronchial lesion noted but RML and RLL were occluded w clot - path, bx's confirm no malignancy. Tx for  pneumonia (no organism specified) with clinical improvement with abx, discharged on Avelox .   She has a known hx of  left upper lobe nodule with prev. FOB  that was neg for per pt.    Since discharge she is feeling much better with decreased cough and congesiton . No further hemopytsis. She is trying to cut back on smoking.   Hosp F/U visit 08/30/11 -- 64 yo smoker with probable COPD, admitted 07/29/11 with hemoptysis and AE-COPD vs PNA. At CT scan showed a LUL 1.1 cm spiculated nodule. She was rx w abx and for AE COPD. No longer having any hemoptysis. CXR 11/29 showed improvement in RLL and RML PNA. Returns now for f/u.  She is down to 3 -4 cig a day. She has some exertional SOB, but not severe. She uses SABA 2x a week.   ROV 09/19/11 -- COPD/tobacco use, recent hosp for hemoptysis as above, LUL nodule on CT scan. Returns today after PFT, had repeat CT on 12/17 that shows stable spiculated LUL nodule (? Smaller). Cigarettes at 4 cig a day. Rare SABA use.   ROV 10/29/11 -- COPD, continued tobacco, LUL nodule (last CT 09/02/11). Breathing better since Spiriva started last time. Still smoking. Need to discuss CT and options for f/u - serial scans vs possible primary resection.   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 QUIT SMOKING!  Wearing O2, need to qualify. Has been tolerating Spiriva. Has some DOE when she hurries. Has not needed Ventolin.   ROV 04/20/12 -- COPD, s/p LUL lobectomy 12/17/11 (Dr Roxan Hockey) for adeno CA.  She has had a lot of ups and downs emotionally - sounds like depression; has been treated with xanax for some anxiety. She doesn't believe she can do her former activties - breathing and fatigue do influence this. She is on Spiriva, seems to be helping her. Has not needed SABA for over a month. No exacerbations. Her walking oximetry showed that she didn't need O2 anymore (after being d/c post-op). She has had 5 cigarettes since her last visit.   ROV 09/14/12 -- COPD, s/p LUL lobectomy 12/17/11 (Dr Roxan Hockey) for adeno CA.  She has a RLL nodule that enlarged from 75m to 928mon scan in 10/13, plan is for a repeat in January. She has been stable with regard to breathing. Remains on Spiriva. Has not needed ventolin. She is having some L rib cage occasional catches or pain w a deep breath.  ROV 03/17/13 -- COPD, s/p LUL lobectomy 12/17/11 (Dr Roxan Hockey) for adeno CA. We are following several nodules, stable on latest CT scans 10/27/12 and 02/23/13. She feels that her breathing is stable, although she does worse in the severe heat and humidity. No new cough, wheeze. She has had some HA last 24 hours. She took loratadine and improved. She is using albuterol rarely. No AE since last time.   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 We are following several nodules, stable on latest CT scans 10/27/12 and 02/23/13. She had a repeat scan 07/13/13 > stable. She was recently seen by Dr Joya Gaskins 08/05/13 for an AE, received abx, depo, nasal steroid, [place]. She is much better, back  to baseline. Not using the flonase anymore. Used the [employer] for 3 days. She is on spiriva. Stopped smoking in October '14. She coughs at night when her sinuses drain.   ROV 10/11/13 -- COPD, s/p LUL lobectomy 12/17/11 (Dr Roxan Hockey) for adeno CA. Nodules stable last CT 10/13/12.  She had been well until about 3-4 days ago. Some nasal congestion, non-prod cough returned at that time. No fever. No sick contacts. Spiriva + SABA.   ROV 12/14/13 -- COPD, s/p LUL lobectomy 12/17/11 (Dr Roxan Hockey) for adeno CA. Nodules stable last CT 10/13/12. On last visit her cough was flaring and we restarted fluticasone, gave steroid taper. She felt better and stopped nasal steroid and loratadine.   Acute OV COPD 03/03/14, s/p LUL lobectomy 12/17/11 adeno CA;  Complains of c/o: scratchy throat / sore, cough non-produtive, wheezing and some sob x3 days. Heat /humidity are making her breathing worse.  No recent abx use or travel.  Patient denies any hemoptysis, orthopnea, PND, or leg swelling.Marland Kitchen Has been taking Claritin daily without much relief of her drainage.  ROV 05/18/14 -- COPD, s/p LUL lobectomy 12/17/11 (Dr Roxan Hockey) for adeno CA. Following pulm nodules by CT with Dr Roxan Hockey. She was treated for an acute flare in June '15. She is improved, back to baseline. She is on Spiriva qd, SABA prn. Her next CT scan will be in October - follows w Dr Roxan Hockey. She is using fluticasone prn. She coughs at night   ROV 01/03/15 -- COPD, s/p LUL lobectomy 12/17/11 (Dr Roxan Hockey) for adeno CA. Following pulm nodules by CT with Dr Roxan Hockey, last scan was 12/27/14 > stable. She has undergone B cataract surgery since last time. She has been doing well, has been able to garden and walk her dog 3x a day. No flares since last time. She has had some cough in the night, especially in the Winter. She rarely uses albuterol. She is on loratadine qd, fluticasone nasal spray prn.   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 I saw her last about 6 weeks ago.  She had been well until about 1 weeks ago - developed sore throat, some congestion, HA, cough. The cough is now productive of clear / cream. She has some dyspnea, some increased wheeze that is now a bit better. No known sick contacts.  She is using flonase prn, loratadine every day.   ROV 05/30/15 -- follow-up visit for severe COPD, history of upper lobectomy for a carcinoma of the lung. She has been treated in June for an acute exacerbation of her COPD. She has been working, has a Designer, [employer]. She has been doing well in the cool air, has difficulty  in the heat or with exertion. She is on spiriva qd, uses loratadine and fluticasone. No other flares except for in June. 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 Very rarely needs SABA.    Objective:   Filed Vitals:   05/30/15 1355  BP: 120/74  Pulse: 88  Height: '5\' 5"'$  (1.651 m)  Weight: 127 lb 9.6 oz (57.879 kg)  SpO2: 94%    GEN: A/Ox3; pleasant , NAD, well nourished   HEENT:  [place]/AT,  EACs-clear, TMs-wnl, NOSE-clear drainage  THROAT-clear, no lesions, no postnasal drip or exudate noted.   NECK:  Supple w/ fair ROM; no JVD; normal carotid impulses w/o bruits; no thyromegaly or nodules palpated; no lymphadenopathy.  RESP  Clear  P & A; w/o, wheezes/ rales/ or rhonchi.  CARD:  RRR, no m/r/g  , no peripheral edema, pulses intact, no cyanosis or clubbing.  Musco: Warm bil, no deformities or joint swelling noted.   Neuro: alert, no focal deficits noted.    Skin: Warm, no lesions or rashes   CT scan 12/27/14 --  IMPRESSION: 1. Stable right upper lobe pulmonary nodules. 2. Stable postsurgical change in the left hemi thorax. 3. Stable nodule over the left hemidiaphragm. 4. No new pulmonary nodularity or evidence of lung cancer recurrence. 5. Emphysematous change within the lungs.    COPD (chronic obstructive  pulmonary disease) We will continue Spiriva for now. If her albuterol use increases or her functional capacity decreases then we will consider change to a combination long-acting bronchodilator. We discussed using her albuterol preemptively before significant exercise  Non-small cell lung cancer Most recent CT scan of the chest was in April and I have reviewed this. She has discussed with Dr. Roxan Hockey. No evidence of recurrence. She's been followed with annual scans  Allergic rhinitis Fluticasone and loratadine

## 2015-05-30 NOTE — Assessment & Plan Note (Signed)
Fluticasone and loratadine

## 2015-07-13 DIAGNOSIS — M546 Pain in thoracic spine: Secondary | ICD-10-CM | POA: Diagnosis not present

## 2015-07-17 DIAGNOSIS — I10 Essential (primary) hypertension: Secondary | ICD-10-CM | POA: Diagnosis not present

## 2015-07-17 DIAGNOSIS — F329 Major depressive disorder, single episode, unspecified: Secondary | ICD-10-CM | POA: Diagnosis not present

## 2015-08-24 DIAGNOSIS — Z01419 Encounter for gynecological examination (general) (routine) without abnormal findings: Secondary | ICD-10-CM | POA: Diagnosis not present

## 2015-08-24 DIAGNOSIS — Z1231 Encounter for screening mammogram for malignant neoplasm of breast: Secondary | ICD-10-CM | POA: Diagnosis not present

## 2015-08-24 DIAGNOSIS — Z6821 Body mass index (BMI) 21.0-21.9, adult: Secondary | ICD-10-CM | POA: Diagnosis not present

## 2015-08-24 DIAGNOSIS — Z1389 Encounter for screening for other disorder: Secondary | ICD-10-CM | POA: Diagnosis not present

## 2015-10-13 ENCOUNTER — Ambulatory Visit: Payer: Medicare Other | Admitting: Emergency Medicine

## 2015-11-14 ENCOUNTER — Ambulatory Visit (INDEPENDENT_AMBULATORY_CARE_PROVIDER_SITE_OTHER): Payer: Medicare Other | Admitting: Pulmonary Disease

## 2015-11-14 ENCOUNTER — Encounter: Payer: Self-pay | Admitting: Pulmonary Disease

## 2015-11-14 VITALS — BP 106/60 | HR 99 | Ht 65.0 in | Wt 130.8 lb

## 2015-11-14 DIAGNOSIS — R05 Cough: Secondary | ICD-10-CM | POA: Diagnosis not present

## 2015-11-14 DIAGNOSIS — J441 Chronic obstructive pulmonary disease with (acute) exacerbation: Secondary | ICD-10-CM | POA: Diagnosis not present

## 2015-11-14 DIAGNOSIS — J309 Allergic rhinitis, unspecified: Secondary | ICD-10-CM

## 2015-11-14 DIAGNOSIS — R059 Cough, unspecified: Secondary | ICD-10-CM

## 2015-11-14 MED ORDER — BENZONATATE 100 MG PO CAPS
100.0000 mg | ORAL_CAPSULE | Freq: Three times a day (TID) | ORAL | Status: DC | PRN
Start: 2015-11-14 — End: 2016-01-16

## 2015-11-14 MED ORDER — PREDNISONE 10 MG PO TABS: ORAL_TABLET | ORAL | Status: DC

## 2015-11-14 NOTE — Patient Instructions (Signed)
   Continue taking your Flonase, Claritin, and Spiriva as prescribed.  Use your albuterol rescue inhaler 3 times daily while you are coughing and sick.  Use the Tessalon Perles I am prescribing you to help your cough.  We are keeping your appointment on Monday with Dr. Lamonte Sakai but please call the office if you have any new fever, chills, or sweats or if her cough starts to produce a yellow, green, or discolored mucus.  Take your prednisone as prescribed:  6 tablets ('60mg'$ ) daily for 3 days then,  4 tablets ('40mg'$ ) daily for 3 days then,  2 tablets ('20mg'$ ) daily for 3 days then,  1 tablet ('10mg'$ ) daily for 3 days and stop.

## 2015-11-14 NOTE — Progress Notes (Signed)
Subjective:    Patient ID: Valerie Mosley, female    DOB: 11-09-1950, 65 y.o.   MRN: [id number]  C.C.:  Acute visit for Cough w/ known COPD & Allergic Rhinitis.  HPI She reports she had a "scratchy" throat on Sunday. She reports she developed a cough on Sunday that has progressively been worsening. She reports the cough is now keeping her awake. She is producing a "clear, white phlegm". She has been gaging but hasn't yet had post-tussive emesis. She reports a mild sinus congestion. No sick contacts. No recent travel. She reports some mild wheezing. She reports mild dyspnea on exertion only. No fever, hot/cold chills, or sweats.   Allergic Rhinitis:  She reports a headache over her forehead for 3 days with some pressure behind her eyes. She reports she is complaint with her Claritin & Spiriva. She hasn't used her rescue inhaler since she's been sick. She is also using her Flonase. Not currently doing any saline rinses.  Review of Systems She reports some pain in her neck muscles and also in her lower back. She feel this may be due to her cough. No chest pain. She does have chest tightness. No nausea, emesis, or diarrhea.   No Known Allergies  Current Outpatient Prescriptions on File Prior to Visit  Medication Sig Dispense Refill  . acetaminophen (TYLENOL) 500 MG tablet Take 500 mg by mouth every 6 (six) hours as needed. For pain or fever    . albuterol (VENTOLIN HFA) 108 (90 BASE) MCG/ACT inhaler Inhale 2 puffs into the lungs every 4 (four) hours as needed for wheezing or shortness of breath. For shortness of breath 8.51 g 5  . amLODipine (NORVASC) 10 MG tablet Take 10 mg by mouth daily.      . fluticasone (FLONASE) 50 MCG/ACT nasal spray Place 2 sprays into both nostrils daily. 16 g 2  . loratadine (CLARITIN) 10 MG tablet Take 10 mg by mouth daily. For allergies    . sertraline (ZOLOFT) 25 MG tablet Take 25 mg by mouth at bedtime.    Marland Kitchen SPIRIVA HANDIHALER 18 MCG inhalation capsule inhale  the contents of one capsule in the handihaler once daily 30 capsule 5   No current facility-administered medications on file prior to visit.    Past Medical History  Diagnosis Date  . Hypertension   . Kyphoscoliosis   . COPD (chronic obstructive pulmonary disease) ([place])   . Cough 07/29/11    started coughing up blood this am  . PONV (postoperative nausea and vomiting)   . Recurrent upper respiratory infection (URI)   . Cough   . Cancer ([place])     Stage IA non-small cell lung cancer, left upper lobectomy 12/2011    Past Surgical History  Procedure Laterality Date  . Urethra surgery    . Thyroid surgery  1980    1/2 thyroid removed - benign  . Bronchoscopy    . Lobectomy  12/17/2011    Procedure: LOBECTOMY;  Surgeon: [name], MD;  Location: [facility] OR;  Service: Thoracic;  Laterality: Left;  (L)VATS, WEDGE RESECTION, LEFT UPPER LOBECTOMY,  Multiple node biopsies    No family history on file.  Social History   Social History  . Marital Status: Married    Spouse Name: N/A  . Number of Children: N/A  . Years of Education: N/A   Social History Main Topics  . Smoking status: Former Smoker -- 0.50 packs/day for 40 years    Types: Cigarettes  Quit date: 12/16/2011  . Smokeless tobacco: Never Used  . Alcohol Use: Yes     Comment: 1-2 beers daily  . Drug Use: No  . Sexual Activity: Not Asked   Other Topics Concern  . None   Social History Narrative      Objective:   Physical Exam BP 106/60 mmHg  Pulse 99  Ht '5\' 5"'$  (1.651 m)  Wt 130 lb 12.8 oz (59.33 kg)  BMI 21.77 kg/m2  SpO2 99% General:  Awake. Alert. No acute distress. Sitting up on exam table.  Integument:  Warm & dry. No rash on exposed skin. HEENT:  Moist mucus membranes. No oral ulcers. No scleral injection or icterus.No sinus tenderness to palpation. Mildly swollen erythematous turbinates. Cardiovascular:  Regular rate. No edema. No appreciable JVD.  Pulmonary:  Good aeration & clear to  auscultation bilaterally. Symmetric chest wall expansion. No accessory muscle use on room air. Abdomen: Soft. Normal bowel sounds. Nondistended. Grossly nontender. Musculoskeletal:  Normal bulk and tone. No joint deformity or effusion appreciated.  PFT 10/14/11:  FVC 2.87 L (88%) FEV1 1.94 L (81%) FEV1/FVC 0.68 FEF 25-75 1.04 L (39%) no bronchodilator response TLC 5.52 L (105%) RV 128% ERV 98% DLCO uncorrected 63%  IMAGING CXR PA/LAT 03/01/15 (per radiologist): Heart normal in size. No pneumothorax or effusion. Stable elevation left hemidiaphragm. Stable left basilar scarring. No acute pulmonary disease noted.  MICROBIOLOGY BAL (07/30/11): Oral Flora / AFB Negative / Fungus Negative Sputum (07/30/11):  Oral Flora    Assessment & Plan:  65 year old female with underlying COPD. Patient presenting with increasing cough as well as sinus congestion and pressure likely secondary to either a viral illness or possibly environmental allergens. Patient has no evidence of worsening respiratory status that would prompt admission at this time. She also has no symptoms or findings that would suggest a bacterial superinfection at this time therefore I do not feel antibiotic therapy is necessary. Patient to notify our office for any new signs or symptoms that would suggest infection that needs to be treated with antibiotics.  1. COPD exacerbation: Cough suppression with Tessalon Perles. Patient instructed to use rescue inhaler 3 times daily while. Initiating prednisone taper. Checking influenza nasal swab. 2. Allergic rhinitis: Patient continuing on Claritin & Flonase. Reports she is intolerant to nasal saline rinse. Initiating prednisone taper. 3. Follow-up: Patient already scheduled for an appointment next week with Dr. Lamonte Sakai. She will keep this appointment.  Sonia Baller Ashok Cordia, M.D. [facility] Pager:  [phone] After 3pm or if no response, call [phone] 2:01 PM 11/14/2015

## 2015-11-15 ENCOUNTER — Telehealth: Payer: Self-pay | Admitting: Emergency Medicine

## 2015-11-15 LAB — POCT INFLUENZA A/B
Influenza A, POC: NEGATIVE
Influenza B, POC: NEGATIVE

## 2015-11-15 NOTE — Telephone Encounter (Signed)
I called spoke with pt. She will be here in aboiut 15-20 min to have flu swab done as they were not in yesterday

## 2015-11-15 NOTE — Addendum Note (Signed)
Addended by: Raymondo Band D on: 11/15/2015 01:58 PM   Modules accepted: Orders

## 2015-11-17 ENCOUNTER — Telehealth: Payer: Self-pay | Admitting: Emergency Medicine

## 2015-11-17 MED ORDER — AZITHROMYCIN 250 MG PO TABS: ORAL_TABLET | ORAL | Status: AC

## 2015-11-17 NOTE — Telephone Encounter (Signed)
Pt is aware of MW's recommendation. Rx has been sent in. Nothing further was needed.

## 2015-11-17 NOTE — Telephone Encounter (Signed)
zpak mucinex dm 1200 mg every 12 hours as needed

## 2015-11-17 NOTE — Telephone Encounter (Signed)
Spoke with pt. She saw JN on 11/14/15 and is not improving. Reports increased coughing, "it sounds like the croup." Cough is producing little mucus which is green. SOB is also present with the cough. Was given Tessalon and a prednisone taper on 11/14/15, these medications are not helping. Has upcoming appointment with RB on Monday. Would like recommendations on what to do until then.  MW - please advise. Thanks.

## 2015-11-20 ENCOUNTER — Encounter: Payer: Self-pay | Admitting: Emergency Medicine

## 2015-11-20 ENCOUNTER — Ambulatory Visit (INDEPENDENT_AMBULATORY_CARE_PROVIDER_SITE_OTHER): Payer: Medicare Other | Admitting: Emergency Medicine

## 2015-11-20 VITALS — BP 102/70 | HR 83 | Ht 65.0 in | Wt 130.0 lb

## 2015-11-20 DIAGNOSIS — J441 Chronic obstructive pulmonary disease with (acute) exacerbation: Secondary | ICD-10-CM | POA: Diagnosis not present

## 2015-11-20 MED ORDER — HYDROCODONE-HOMATROPINE 5-1.5 MG/5ML PO SYRP: 5.0000 mL | ORAL_SOLUTION | Freq: Four times a day (QID) | ORAL | Status: DC | PRN

## 2015-11-20 MED ORDER — ALBUTEROL SULFATE HFA 108 (90 BASE) MCG/ACT IN AERS: 2.0000 | INHALATION_SPRAY | RESPIRATORY_TRACT | Status: AC | PRN

## 2015-11-20 NOTE — Addendum Note (Signed)
Addended by: Desmond Dike C on: 11/20/2015 09:29 AM   Modules accepted: Orders

## 2015-11-20 NOTE — Assessment & Plan Note (Addendum)
Acute exacerbation in the setting of upper respiratory infection. No wheeze today. She is actually doing fairly well on the prednisone and azithromycin. Her symptoms likely relate to the upper respirator infection that has not yet cleared. We will try treating her symptomatically as she finishes her steroids and antibiotics.  Please continue and finish your prednisone and azithromycin.  Continue Spiriva Use ventolin as needed.  Continue to use tessalon perles as needed Please use hycodan 5cc up to every 6 hours as needed for cough.  Try using Tylenol Cold and Flu for symptoms.  Follow with Dr Lamonte Sakai in 3 months or sooner if you have any problems.

## 2015-11-20 NOTE — Progress Notes (Signed)
Subjective:    Patient ID: Valerie Mosley, female    DOB: 1951/05/04, 65 y.o.   MRN: [id number] HPI 65 yo WF , active smoker,  seen for initial pulmonary consult during hospital stay for hemoptysis 07/29/11 >tx for presumed CAP   08/13/2011 [facility]  Admitted 11/12-11/19/12 for hemoptysis with underling presumed COPD and supsected CAP. CT chest showed 1. 1.1 cm left upper lobe mass, Patchy density in the right upper lobe and minimal patchy density in the right lower lobe  She underwent  FOB from 11/13, all blood was on the R (small apical nodule is on the left). No endobronchial lesion noted but RML and RLL were occluded w clot - path, bx's confirm no malignancy. Tx for  pneumonia (no organism specified) with clinical improvement with abx, discharged on Avelox .   She has a known hx of  left upper lobe nodule with prev. FOB  that was neg for per pt.    Since discharge she is feeling much better with decreased cough and congesiton . No further hemopytsis. She is trying to cut back on smoking.   Hosp F/U visit 08/30/11 -- 65 yo smoker with probable COPD, admitted 07/29/11 with hemoptysis and AE-COPD vs PNA. At CT scan showed a LUL 1.1 cm spiculated nodule. She was rx w abx and for AE COPD. No longer having any hemoptysis. CXR 11/29 showed improvement in RLL and RML PNA. Returns now for f/u.  She is down to 3 -4 cig a day. She has some exertional SOB, but not severe. She uses SABA 2x a week.   ROV 09/19/11 -- COPD/tobacco use, recent hosp for hemoptysis as above, LUL nodule on CT scan. Returns today after PFT, had repeat CT on 12/17 that shows stable spiculated LUL nodule (? Smaller). Cigarettes at 4 cig a day. Rare SABA use.   ROV 10/29/11 -- COPD, continued tobacco, LUL nodule (last CT 09/02/11). Breathing better since Spiriva started last time. Still smoking. Need to discuss CT and options for f/u - serial scans vs possible primary resection.   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 QUIT SMOKING!  Wearing O2, need to qualify. Has been tolerating Spiriva. Has some DOE when she hurries. Has not needed Ventolin.   ROV 04/20/12 -- COPD, s/p LUL lobectomy 12/17/11 (Dr Roxan Hockey) for adeno CA.  She has had a lot of ups and downs emotionally - sounds like depression; has been treated with xanax for some anxiety. She doesn't believe she can do her former activties - breathing and fatigue do influence this. She is on Spiriva, seems to be helping her. Has not needed SABA for over a month. No exacerbations. Her walking oximetry showed that she didn't need O2 anymore (after being d/c post-op). She has had 5 cigarettes since her last visit.   ROV 09/14/12 -- COPD, s/p LUL lobectomy 12/17/11 (Dr Roxan Hockey) for adeno CA.  She has a RLL nodule that enlarged from 33m to 953mon scan in 10/13, plan is for a repeat in January. She has been stable with regard to breathing. Remains on Spiriva. Has not needed ventolin. She is having some L rib cage occasional catches or pain w a deep breath.  ROV 03/17/13 -- COPD, s/p LUL lobectomy 12/17/11 (Dr Roxan Hockey) for adeno CA. We are following several nodules, stable on latest CT scans 10/27/12 and 02/23/13. She feels that her breathing is stable, although she does worse in the severe heat and humidity. No new cough, wheeze. She has had some HA last 24 hours. She took loratadine and improved. She is using albuterol rarely. No AE since last time.   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 We are following several nodules, stable on latest CT scans 10/27/12 and 02/23/13. She had a repeat scan 07/13/13 > stable. She was recently seen by Dr Joya Gaskins 08/05/13 for an AE, received abx, depo, nasal steroid, [place]. She is much better, back  to baseline. Not using the flonase anymore. Used the [employer] for 3 days. She is on spiriva. Stopped smoking in October '14. She coughs at night when her sinuses drain.   ROV 10/11/13 -- COPD, s/p LUL lobectomy 12/17/11 (Dr Roxan Hockey) for adeno CA. Nodules stable last CT 10/13/12.  She had been well until about 3-4 days ago. Some nasal congestion, non-prod cough returned at that time. No fever. No sick contacts. Spiriva + SABA.   ROV 12/14/13 -- COPD, s/p LUL lobectomy 12/17/11 (Dr Roxan Hockey) for adeno CA. Nodules stable last CT 10/13/12. On last visit her cough was flaring and we restarted fluticasone, gave steroid taper. She felt better and stopped nasal steroid and loratadine.   Acute OV COPD 03/03/14, s/p LUL lobectomy 12/17/11 adeno CA;  Complains of c/o: scratchy throat / sore, cough non-produtive, wheezing and some sob x3 days. Heat /humidity are making her breathing worse.  No recent abx use or travel.  Patient denies any hemoptysis, orthopnea, PND, or leg swelling.Marland Kitchen Has been taking Claritin daily without much relief of her drainage.  ROV 05/18/14 -- COPD, s/p LUL lobectomy 12/17/11 (Dr Roxan Hockey) for adeno CA. Following pulm nodules by CT with Dr Roxan Hockey. She was treated for an acute flare in June '15. She is improved, back to baseline. She is on Spiriva qd, SABA prn. Her next CT scan will be in October - follows w Dr Roxan Hockey. She is using fluticasone prn. She coughs at night   ROV 01/03/15 -- COPD, s/p LUL lobectomy 12/17/11 (Dr Roxan Hockey) for adeno CA. Following pulm nodules by CT with Dr Roxan Hockey, last scan was 12/27/14 > stable. She has undergone B cataract surgery since last time. She has been doing well, has been able to garden and walk her dog 3x a day. No flares since last time. She has had some cough in the night, especially in the Winter. She rarely uses albuterol. She is on loratadine qd, fluticasone nasal spray prn.   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 I saw her last about 6 weeks ago.  She had been well until about 1 weeks ago - developed sore throat, some congestion, HA, cough. The cough is now productive of clear / cream. She has some dyspnea, some increased wheeze that is now a bit better. No known sick contacts.  She is using flonase prn, loratadine every day.   ROV 05/30/15 -- follow-up visit for severe COPD, history of upper lobectomy for a carcinoma of the lung. She has been treated in June for an acute exacerbation of her COPD. She has been working, has a Designer, [employer]. She has been doing well in the cool air, has difficulty  in the heat or with exertion. She is on spiriva qd, uses loratadine and fluticasone. No other flares except for in June. 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 Very rarely needs SABA.   ROV 11/20/15 -- patient with a history of severe COPD and a prior upper lobectomy due to non-small cell lung cancer. She was recently seen in our office by [facility] for acute exacerbation in the setting of an upper respirator infection. Her influenza A and B screens performed 5 days ago were negative. She was treated with tapering prednisone. She was dealing with HA, cough, clear mucous. She has taken pred as directed, continues to have nocturnal cough. Started azithro 3 days ago, finishing this. She is using robitussin DM. She is not having much wheeze. Some white thick mucous.    Objective:   Filed Vitals:   11/20/15 0910 11/20/15 0911  BP:  102/70  Pulse:  83  Height: '5\' 5"'$  (1.651 m)   Weight: 130 lb (58.968 kg)   SpO2:  95%    GEN: A/Ox3; pleasant , NAD, well nourished   HEENT:  [place]/AT,  EACs-clear, TMs-wnl, NOSE-clear drainage  THROAT-clear, no lesions, no postnasal drip or exudate noted.   NECK:  Supple w/ fair ROM; no JVD; normal carotid impulses w/o bruits; no thyromegaly or nodules palpated; no lymphadenopathy.  RESP  Clear  P & A; w/o,  wheezes/ rales/ or rhonchi.  CARD:  RRR, no m/r/g  , no peripheral edema, pulses intact, no cyanosis or clubbing.  Musco: Warm bil, no deformities or joint swelling noted.   Neuro: alert, no focal deficits noted.    Skin: Warm, no lesions or rashes   CT scan 12/27/14 --  IMPRESSION: 1. Stable right upper lobe pulmonary nodules. 2. Stable postsurgical change in the left hemi thorax. 3. Stable nodule over the left hemidiaphragm. 4. No new pulmonary nodularity or evidence of lung cancer recurrence. 5. Emphysematous change within the lungs.    COPD exacerbation (HCC) Acute exacerbation in the setting of upper respiratory infection. No wheeze today. She is actually doing fairly well on the prednisone and azithromycin. Her symptoms likely relate to the upper respirator infection that has not yet cleared. We will try treating her symptomatically as she finishes her steroids and antibiotics.  Please continue and finish your prednisone and azithromycin.  Continue Spiriva Use ventolin as needed.  Continue to use tessalon perles as needed Please use hycodan 5cc up to every 6 hours as needed for cough.  Try using Tylenol Cold and Flu for symptoms.  Follow with Dr Lamonte Sakai in 3 months or sooner if you have any problems.

## 2015-11-20 NOTE — Patient Instructions (Addendum)
Please continue and finish your prednisone and azithromycin.  Continue Spiriva Use ventolin as needed.  Continue to use tessalon perles as needed Please use hycodan 5cc up to every 6 hours as needed for cough.  Try using Tylenol Cold and Flu for symptoms.  Follow with Dr Lamonte Sakai in 3 months or sooner if you have any problems.

## 2015-11-20 NOTE — Addendum Note (Signed)
Addended by: Desmond Dike C on: 11/20/2015 10:14 AM   Modules accepted: Orders

## 2015-12-11 ENCOUNTER — Other Ambulatory Visit: Payer: Self-pay | Admitting: *Deleted

## 2015-12-11 DIAGNOSIS — C3490 Malignant neoplasm of unspecified part of unspecified bronchus or lung: Secondary | ICD-10-CM

## 2016-01-09 ENCOUNTER — Ambulatory Visit
Admission: RE | Admit: 2016-01-09 | Discharge: 2016-01-09 | Disposition: A | Discharge status: Home / Self Care | Payer: Medicare Other | Source: Ambulatory Visit | Attending: Thoracic Surgery (Cardiothoracic Vascular Surgery) | Admitting: Thoracic Surgery (Cardiothoracic Vascular Surgery)

## 2016-01-09 ENCOUNTER — Ambulatory Visit: Payer: Medicare Other | Admitting: Thoracic Surgery (Cardiothoracic Vascular Surgery)

## 2016-01-09 DIAGNOSIS — R918 Other nonspecific abnormal finding of lung field: Secondary | ICD-10-CM | POA: Diagnosis not present

## 2016-01-09 DIAGNOSIS — C3490 Malignant neoplasm of unspecified part of unspecified bronchus or lung: Secondary | ICD-10-CM

## 2016-01-10 DIAGNOSIS — K648 Other hemorrhoids: Secondary | ICD-10-CM | POA: Diagnosis not present

## 2016-01-10 DIAGNOSIS — K625 Hemorrhage of anus and rectum: Secondary | ICD-10-CM | POA: Diagnosis not present

## 2016-01-10 DIAGNOSIS — Z1211 Encounter for screening for malignant neoplasm of colon: Secondary | ICD-10-CM | POA: Diagnosis not present

## 2016-01-16 ENCOUNTER — Encounter: Payer: Self-pay | Admitting: Thoracic Surgery (Cardiothoracic Vascular Surgery)

## 2016-01-16 ENCOUNTER — Ambulatory Visit (INDEPENDENT_AMBULATORY_CARE_PROVIDER_SITE_OTHER): Payer: Medicare Other | Admitting: Thoracic Surgery (Cardiothoracic Vascular Surgery)

## 2016-01-16 VITALS — BP 109/74 | HR 88 | Resp 16 | Ht 65.0 in | Wt 130.0 lb

## 2016-01-16 DIAGNOSIS — R918 Other nonspecific abnormal finding of lung field: Secondary | ICD-10-CM

## 2016-01-16 DIAGNOSIS — Z902 Acquired absence of lung [part of]: Secondary | ICD-10-CM

## 2016-01-16 DIAGNOSIS — Z85118 Personal history of other malignant neoplasm of bronchus and lung: Secondary | ICD-10-CM | POA: Diagnosis not present

## 2016-01-16 NOTE — Progress Notes (Signed)
[address]       [place] [address]             [phone]       HPI: Valerie Mosley returns today for a scheduled follow-up visit (4 years after resection)  She is a 65 year old woman who had a thoracoscopic left upper lobectomy in April 2013 for stage IA non-small cell carcinoma. She had a long history of tobacco abuse leading up to her surgery, but quit smoking after the surgery and has not smoked since then.   I followed her with CT scans every 6 months at the surgery. She has multiple small right lung nodules and a nodule at the base of the left lower lobe adjacent to the diaphragm, which have been stable over time.  She says that she's been doing well since her last office visit in October 2016. She was treated earlier this year by Dr. Lamonte Sakai for an exacerbation of her COPD. She is off steroids and not having any significant problems with her breathing presently. She denies any unusual bone or joint pain or headaches or visual changes. Her weight is stable and her appetite is good.  Past Medical History  Diagnosis Date  . Hypertension   . Kyphoscoliosis   . COPD (chronic obstructive pulmonary disease) ([place])   . Cough 07/29/11    started coughing up blood this am  . PONV (postoperative nausea and vomiting)   . Recurrent upper respiratory infection (URI)   . Cough   . Cancer ([place])     Stage IA non-small cell lung cancer, left upper lobectomy 12/2011     Current Outpatient Prescriptions  Medication Sig Dispense Refill  . acetaminophen (TYLENOL) 500 MG tablet Take 500 mg by mouth every 6 (six) hours as needed. For pain or fever    . albuterol (VENTOLIN HFA) 108 (90 Base) MCG/ACT inhaler Inhale 2 puffs into the lungs every 4 (four) hours as needed for wheezing or shortness of breath. For shortness of breath 8.51 g 5  . amLODipine (NORVASC) 10 MG tablet Take 10 mg by mouth daily.      . fluticasone (FLONASE) 50 MCG/ACT nasal spray Place 2 sprays into both  nostrils daily. 16 g 2  . loratadine (CLARITIN) 10 MG tablet Take 10 mg by mouth daily. For allergies    . sertraline (ZOLOFT) 25 MG tablet Take 25 mg by mouth at bedtime.    Marland Kitchen SPIRIVA HANDIHALER 18 MCG inhalation capsule inhale the contents of one capsule in the handihaler once daily 30 capsule 5   No current facility-administered medications for this visit.    Physical Exam BP 109/74 mmHg  Pulse 88  Resp 16  Ht '5\' 5"'$  (1.651 m)  Wt 130 lb (58.968 kg)  BMI 21.63 kg/m2  SpO73 3% 65 year old woman in no acute distress Alert and oriented 3 with no focal deficits No cervical or subclavicular adenopathy Cardiac regular rate and rhythm normal S1 and S2 Lungs with mild rhonchi bilaterally, no wheezing  Diagnostic Tests: CT CHEST WITHOUT CONTRAST  TECHNIQUE: Multidetector CT imaging of the chest was performed following the standard protocol without IV contrast.  COMPARISON: 12/27/2014 dating back to 09/02/2011.  FINDINGS: Cardiovascular: Heart size upper normal to slightly enlarged, unchanged. Moderate to severe 3 vessel coronary atherosclerosis. Small pericardial effusion in the superior recess, unchanged. Moderate to severe atherosclerosis involving the thoracic and upper abdominal aorta without aneurysm.  Mediastinum/Lymph Nodes: No pathologic lymphadenopathy. Normal-appearing esophagus. Asymmetric enlargement  of the right lobe of the thyroid gland with numerous vague small nodules, the largest involving the lower pole measuring approximate 1.7 cm, unchanged.  Lungs/Pleura: Postsurgical changes related to prior left upper lobectomy. Severe centrilobular emphysema, unchanged.  Three adjacent nodules in the inferior right upper lobe as noted on the prior examinations. Since the examination 1 year ago, no significant interval change, with the largest nodule measuring approximately 8 mm. When compared to the initial CT from December, 2012, the largest nodule has  increased in size as it measured approximately 5 mm on that examination. The next largest nodule which is adjacent to the medial pleura measures 6 mm, unchanged dating back to 2012. The vague smaller nodule between these two measures approximately 5 mm, increased in size since 2012 where it measured approximately 3 mm.  The nodular scarring deep in the left lower lobe is unchanged, the nodular component measuring approximately 11 mm. No new pulmonary parenchymal nodules in either lung.  No pleural masses. No pleural effusions.  Upper abdomen: Numerous hepatic cysts as noted previously. Scarring involving the mid pole and visualized lower pole of the left kidney. Simple cysts arising from the visualized lower pole left kidney. Approximate 5 mm calculus in a mid calix of the left kidney. No new abnormalities involving the upper abdomen.  Musculoskeletal: Severe exaggeration of the usual thoracic kyphosis. Degenerative changes throughout the thoracic spine. Mild osseous demineralization. Interval mild compression fracture of the lower endplate of T3.  IMPRESSION: 1. 3 adjacent right upper lobe lung nodules as noted previously. The largest nodule now measures approximately 8 mm, not significantly changed since the CT 1 year ago but increased in size since the original CT in December, 2012. 2. Stable nodular scarring deep in the left lower lobe. 3. Severe emphysema. No acute cardiopulmonary disease. 4. Interval mild compression fracture of the lower endplate of T3 since the CT 1 year ago, not likely acute.   Electronically Signed  By: Evangeline Dakin M.D.  On: 01/09/2016 13:05  I reviewed the CT chest and concur with the findings as noted above.  Impression: 65 year old woman who is now 4 years out from left upper lobectomy for stage IA non-small cell carcinoma. She has no evidence of recurrent disease. I will see her back in one year with a CT chest for her 5 year  follow-up visit.  History of Tobacco abuse- she quit in 2013 and has not smoked since.  Multiple pulmonary nodules- she has multiple nodules in the right upper lobe as well as a basilar left lower lobe nodule that had been stable over multiple scans. One of the nodules in the right upper lobe is larger than it was in 2011. We'll reevaluate those when she returns with a CT in 1 year.  Coronary artery disease- she has coronary calcification noted on CT which is been present for some time. She has not had any anginal symptoms or coronary events.  Plan: Return in one year with CT chest  [name], MD [facility] [phone]

## 2016-02-19 ENCOUNTER — Other Ambulatory Visit: Payer: Self-pay | Admitting: *Deleted

## 2016-02-19 MED ORDER — FLUTICASONE PROPIONATE 50 MCG/ACT NA SUSP: 2.0000 | Freq: Every day | NASAL | Status: DC

## 2016-02-20 ENCOUNTER — Ambulatory Visit (INDEPENDENT_AMBULATORY_CARE_PROVIDER_SITE_OTHER): Payer: Medicare Other | Admitting: Adult Health

## 2016-02-20 ENCOUNTER — Encounter: Payer: Self-pay | Admitting: Adult Health

## 2016-02-20 VITALS — BP 124/80 | HR 84 | Temp 98.3°F | Ht 65.0 in | Wt 128.6 lb

## 2016-02-20 DIAGNOSIS — J441 Chronic obstructive pulmonary disease with (acute) exacerbation: Secondary | ICD-10-CM

## 2016-02-20 MED ORDER — PREDNISONE 10 MG PO TABS: ORAL_TABLET | ORAL | Status: DC

## 2016-02-20 MED ORDER — HYDROCODONE-HOMATROPINE 5-1.5 MG/5ML PO SYRP: 5.0000 mL | ORAL_SOLUTION | Freq: Four times a day (QID) | ORAL | Status: DC | PRN

## 2016-02-20 NOTE — Patient Instructions (Addendum)
Prednisone taper over next week.  Mucinex DM Twice daily As needed  Cough and congestion  Hydromet 1 tsp every 6hr as needed , may make you sleepy.  Follow up Dr. Lamonte Sakai  In 3 months and As needed   Please contact office for sooner follow up if symptoms do not improve or worsen or seek emergency care

## 2016-02-20 NOTE — Assessment & Plan Note (Signed)
Flare   Plan  Prednisone taper over next week.  Mucinex DM Twice daily As needed  Cough and congestion  Hydromet 1 tsp every 6hr as needed , may make you sleepy.  Follow up Dr. Lamonte Sakai  In 3 months and As needed   Please contact office for sooner follow up if symptoms do not improve or worsen or seek emergency care

## 2016-02-20 NOTE — Progress Notes (Signed)
Subjective:    Patient ID: Valerie Mosley, female    DOB: 06/06/1951, 65 y.o.   MRN: [id number] HPI 65 yo WF , active smoker, with severe COPD, LUL lobectomy for Adenocarcinoma in 12/2011     02/20/2016 Acute OV : severe COPD and history of left upper lobe lobectomy for adenocarcinoma the lung. Patient presents for an acute office visit Patient complains of cough for 5-6 days-clear in color when productive. Can not breathe when cough gets so bad and gets feeliing of wanting to vomit. Denies any CP, fever, or chills. Used tessalon without any help.  Remains on Spiriva daily . Uses ProAir most day about 1 time a day.   Most recent CT chest 01/09/2016 showed 3 right upper lobe nodules without significant change 1 year, slightly increased size since December 2012, severe emphysema.  Denies any hemoptysis, chest pain, orthopnea, PND, or increased leg swelling.  Constitutional:   No  weight loss, night sweats,  Fevers, chills,  +fatigue, or  lassitude.  HEENT:   No headaches,  Difficulty swallowing,  Tooth/dental problems, or  Sore throat,                No sneezing, itching, ear ache, nasal congestion, post nasal drip,   CV:  No chest pain,  Orthopnea, PND, swelling in lower extremities, anasarca, dizziness, palpitations, syncope.   GI  No heartburn, indigestion, abdominal pain, nausea, vomiting, diarrhea, change in bowel habits, loss of appetite, bloody stools.   Resp:  No chest wall deformity  Skin: no rash or lesions.  GU: no dysuria, change in color of urine, no urgency or frequency.  No flank pain, no hematuria   MS:  No joint pain or swelling.  No decreased range of motion.  No back pain.  Psych:  No change in mood or affect. No depression or anxiety.  No memory loss.        Past Medical History  Diagnosis Date  . Hypertension   . Kyphoscoliosis   . COPD (chronic obstructive pulmonary disease) ([place])   . Cough 07/29/11    started coughing up blood this am  . PONV  (postoperative nausea and vomiting)   . Recurrent upper respiratory infection (URI)   . Cough   . Cancer ([place])     Stage IA non-small cell lung cancer, left upper lobectomy 12/2011   Current Outpatient Prescriptions on File Prior to Visit  Medication Sig Dispense Refill  . acetaminophen (TYLENOL) 500 MG tablet Take 500 mg by mouth every 6 (six) hours as needed. For pain or fever    . albuterol (VENTOLIN HFA) 108 (90 Base) MCG/ACT inhaler Inhale 2 puffs into the lungs every 4 (four) hours as needed for wheezing or shortness of breath. For shortness of breath 8.51 g 5  . amLODipine (NORVASC) 10 MG tablet Take 10 mg by mouth daily.      . fluticasone (FLONASE) 50 MCG/ACT nasal spray Place 2 sprays into both nostrils daily. 16 g 5  . loratadine (CLARITIN) 10 MG tablet Take 10 mg by mouth daily. For allergies    . sertraline (ZOLOFT) 25 MG tablet Take 25 mg by mouth at bedtime.    Marland Kitchen SPIRIVA HANDIHALER 18 MCG inhalation capsule inhale the contents of one capsule in the handihaler once daily 30 capsule 5   No current facility-administered medications on file prior to visit.      Objective:   Filed Vitals:   02/20/16 1053  BP: 124/80  Pulse: 84  Temp: 98.3 F (36.8 C)  TempSrc: Oral  Height: '5\' 5"'$  (1.651 m)  Weight: 128 lb 9.6 oz (58.333 kg)  SpO2: 95%     GEN: A/Ox3; pleasant , NAD, elderly  HEENT:  [place]/AT,  EACs-clear, TMs-wnl, NOSE-clear drainage  THROAT-clear, no lesions, no postnasal drip or exudate noted.   NECK:  Supple w/ fair ROM; no JVD; normal carotid impulses w/o bruits; no thyromegaly or nodules palpated; no lymphadenopathy.  RESP Decreased BS in bases . No wheezing  Kyphosis  CARD:  RRR, no m/r/g  , no peripheral edema, pulses intact, no cyanosis or clubbing.  Musco: Warm bil, no deformities or joint swelling noted.   Neuro: alert, no focal deficits noted.    Skin: Warm, no lesions or rashes  Arielys Wandersee NP-C  [facility]  02/20/2016

## 2016-02-29 DIAGNOSIS — Z0000 Encounter for general adult medical examination without abnormal findings: Secondary | ICD-10-CM | POA: Diagnosis not present

## 2016-02-29 DIAGNOSIS — J449 Chronic obstructive pulmonary disease, unspecified: Secondary | ICD-10-CM | POA: Diagnosis not present

## 2016-02-29 DIAGNOSIS — Z1389 Encounter for screening for other disorder: Secondary | ICD-10-CM | POA: Diagnosis not present

## 2016-02-29 DIAGNOSIS — I10 Essential (primary) hypertension: Secondary | ICD-10-CM | POA: Diagnosis not present

## 2016-02-29 DIAGNOSIS — E785 Hyperlipidemia, unspecified: Secondary | ICD-10-CM | POA: Diagnosis not present

## 2016-02-29 DIAGNOSIS — F329 Major depressive disorder, single episode, unspecified: Secondary | ICD-10-CM | POA: Diagnosis not present

## 2016-03-04 ENCOUNTER — Ambulatory Visit (INDEPENDENT_AMBULATORY_CARE_PROVIDER_SITE_OTHER): Payer: Medicare Other | Admitting: Emergency Medicine

## 2016-03-04 ENCOUNTER — Encounter: Payer: Self-pay | Admitting: Emergency Medicine

## 2016-03-04 VITALS — BP 96/62 | HR 76 | Ht 65.0 in | Wt 127.0 lb

## 2016-03-04 DIAGNOSIS — J449 Chronic obstructive pulmonary disease, unspecified: Secondary | ICD-10-CM | POA: Diagnosis not present

## 2016-03-04 DIAGNOSIS — J309 Allergic rhinitis, unspecified: Secondary | ICD-10-CM | POA: Diagnosis not present

## 2016-03-04 DIAGNOSIS — C3492 Malignant neoplasm of unspecified part of left bronchus or lung: Secondary | ICD-10-CM | POA: Diagnosis not present

## 2016-03-04 NOTE — Assessment & Plan Note (Signed)
Most recent CT scan of the chest in April 2017 was stable without any evidence of recurrent disease. Continue follow-up with Dr Roxan Hockey for serial scans.

## 2016-03-04 NOTE — Progress Notes (Signed)
Subjective:    Patient ID: Valerie Mosley, female    DOB: [birth year], 65 y.o.   MRN: [id number] HPI 65 yo woman, history of tobacco use and COPD, status post left upper lobe lobectomy in April 2013 for adenocarcinoma of the lung.   PULMONARY FUNCTON TEST 09/19/2011  Peak Flow 130  FVC 2.87  FEV1 1.94  FEV1/FVC 67.6  FVC  % Predicted 88  FEV % Predicted 81  FeF 25-75 1.04  FeF 25-75 % Predicted 2.65     ROV 01/03/15 -- COPD, s/p LUL lobectomy 12/17/11 (Dr Roxan Hockey) for adeno CA. Following pulm nodules by CT with Dr Roxan Hockey, last scan was 12/27/14 > stable. She has undergone B cataract surgery since last time. She has been doing well, has been able to garden and walk her dog 3x a day. No flares since last time. She has had some cough in the night, especially in the Winter. She rarely uses albuterol. She is on loratadine qd, fluticasone nasal spray prn.   Acute OV 02/15/15 -- follow-up visit for severe COPD and history of left upper lobe lobectomy for adenocarcinoma the lung. I saw her last about 6 weeks ago.  She had been well until about 1 weeks ago - developed sore throat, some congestion, HA, cough. The cough is now productive of clear / cream. She has some dyspnea, some increased wheeze that is now a bit better. No known sick contacts.  She is using flonase prn, loratadine every day.   ROV 05/30/15 -- follow-up visit for severe COPD, history of upper lobectomy for a carcinoma of the lung. She has been treated in June for an acute exacerbation of her COPD. 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 She is on spiriva qd, uses loratadine and fluticasone. No other flares except for in June. 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 Very rarely needs SABA.   ROV 11/20/15 -- patient with a history of severe COPD and a prior upper lobectomy due to non-small cell lung cancer. She  was recently seen in our office by [facility] for acute exacerbation in the setting of an upper respirator infection. Her influenza A and B screens performed 5 days ago were negative. She was treated with tapering prednisone. She was dealing with HA, cough, clear mucous. She has taken pred as directed, continues to have nocturnal cough. Started azithro 3 days ago, finishing this. She is using robitussin DM. She is not having much wheeze. Some white thick mucous.   ROV 03/04/16 -- patient with a history of severe COPD, prior left upper lobectomy for adenocarcinoma the lung. She was seen in our office 11 days ago for acute exacerbation and treated with prednisone, Mucinex, Hydromet. Mainly had cough and SOB - may have been set off by exposure outside. Her last CT was April 2017 - stable nodules without evidence recurrent disease. She continues to garden and do housework. She is on spiriva, rarely uses ProAir (once every few weeks). Not smoking.    Objective:   Filed Vitals:   03/04/16 1018 03/04/16 1019  BP:  96/62  Pulse:  76  Height: '5\' 5"'$  (1.651 m)   Weight: 127 lb (57.607 kg)   SpO2:  97%    GEN: A/Ox3; pleasant , NAD, well nourished   HEENT:  [place]/AT,  EACs-clear, TMs-wnl, NOSE-clear drainage  THROAT-clear, no lesions, no  postnasal drip or exudate noted.   NECK:  Supple w/ fair ROM; no JVD; normal carotid impulses w/o bruits; no thyromegaly or nodules palpated; no lymphadenopathy.  RESP  Clear  P & A; w/o, wheezes/ rales/ or rhonchi.  CARD:  RRR, no m/r/g  , no peripheral edema, pulses intact, no cyanosis or clubbing.  Musco: Warm bil, no deformities or joint swelling noted.   Neuro: alert, no focal deficits noted.    Skin: Warm, no lesions or rashes   CT scan 12/27/14 --  IMPRESSION: 1. Stable right upper lobe pulmonary nodules. 2. Stable postsurgical change in the left hemi thorax. 3. Stable nodule over the left hemidiaphragm. 4. No new pulmonary nodularity or evidence of lung  cancer recurrence. 5. Emphysematous change within the lungs.    COPD (chronic obstructive pulmonary disease) Please continue your Spiriva as you have been taking it Take albuterol 2 puffs up to every 4 hours if needed for shortness of breath.  You should be OK to undergo conscious sedation for your colonoscopy. They will monitor your oxygen levels and your carbon dioxide levels during the case. Take your inhaled medication that day as you always do. Take your albuterol inhaler with you to the procedure.  Follow with Dr Lamonte Sakai in 3 months or sooner if you have any problems  Non-small cell lung cancer Most recent CT scan of the chest in April 2017 was stable without any evidence of recurrent disease. Continue follow-up with Dr Roxan Hockey for serial scans.    Baltazar Apo, MD, PhD 03/04/2016, 10:45 AM [facility] [phone] or if no answer [phone]

## 2016-03-04 NOTE — Assessment & Plan Note (Signed)
Please continue your Spiriva as you have been taking it Take albuterol 2 puffs up to every 4 hours if needed for shortness of breath.  You should be OK to undergo conscious sedation for your colonoscopy. They will monitor your oxygen levels and your carbon dioxide levels during the case. Take your inhaled medication that day as you always do. Take your albuterol inhaler with you to the procedure.  Follow with Dr Lamonte Sakai in 3 months or sooner if you have any problems

## 2016-03-04 NOTE — Patient Instructions (Signed)
Please continue your Spiriva as you have been taking it Take albuterol 2 puffs up to every 4 hours if needed for shortness of breath.  You should be OK to undergo conscious sedation for your colonoscopy. They will monitor your oxygen levels and your carbon dioxide levels during the case. Take your inhaled medication that day as you always do. Take your albuterol inhaler with you to the procedure.  Follow with Dr Lamonte Sakai in 3 months or sooner if you have any problems

## 2016-03-13 ENCOUNTER — Other Ambulatory Visit: Payer: Self-pay | Admitting: Gastroenterology

## 2016-03-13 DIAGNOSIS — Z1211 Encounter for screening for malignant neoplasm of colon: Secondary | ICD-10-CM | POA: Diagnosis not present

## 2016-03-13 DIAGNOSIS — D127 Benign neoplasm of rectosigmoid junction: Secondary | ICD-10-CM | POA: Diagnosis not present

## 2016-03-13 DIAGNOSIS — K5730 Diverticulosis of large intestine without perforation or abscess without bleeding: Secondary | ICD-10-CM | POA: Diagnosis not present

## 2016-03-13 DIAGNOSIS — K635 Polyp of colon: Secondary | ICD-10-CM | POA: Diagnosis not present

## 2016-03-13 DIAGNOSIS — K621 Rectal polyp: Secondary | ICD-10-CM | POA: Diagnosis not present

## 2016-04-19 DIAGNOSIS — H1851 Endothelial corneal dystrophy: Secondary | ICD-10-CM | POA: Diagnosis not present

## 2016-04-19 DIAGNOSIS — Z961 Presence of intraocular lens: Secondary | ICD-10-CM | POA: Diagnosis not present

## 2016-04-19 DIAGNOSIS — H26492 Other secondary cataract, left eye: Secondary | ICD-10-CM | POA: Diagnosis not present

## 2016-04-19 DIAGNOSIS — H43813 Vitreous degeneration, bilateral: Secondary | ICD-10-CM | POA: Diagnosis not present

## 2016-05-22 ENCOUNTER — Ambulatory Visit: Payer: Medicare Other | Admitting: Emergency Medicine

## 2016-05-29 ENCOUNTER — Other Ambulatory Visit: Payer: Self-pay | Admitting: Emergency Medicine

## 2016-06-24 ENCOUNTER — Encounter: Payer: Self-pay | Admitting: Acute Care

## 2016-06-24 ENCOUNTER — Ambulatory Visit (INDEPENDENT_AMBULATORY_CARE_PROVIDER_SITE_OTHER): Payer: Medicare Other | Admitting: Acute Care

## 2016-06-24 ENCOUNTER — Telehealth: Payer: Self-pay | Admitting: Acute Care

## 2016-06-24 ENCOUNTER — Ambulatory Visit (INDEPENDENT_AMBULATORY_CARE_PROVIDER_SITE_OTHER)
Admission: RE | Admit: 2016-06-24 | Discharge: 2016-06-24 | Disposition: A | Discharge status: Home / Self Care | Payer: Medicare Other | Source: Ambulatory Visit | Attending: Acute Care | Admitting: Acute Care

## 2016-06-24 VITALS — BP 108/70 | HR 83 | Temp 98.3°F | Ht 65.0 in | Wt 129.0 lb

## 2016-06-24 DIAGNOSIS — J449 Chronic obstructive pulmonary disease, unspecified: Secondary | ICD-10-CM

## 2016-06-24 DIAGNOSIS — J209 Acute bronchitis, unspecified: Secondary | ICD-10-CM

## 2016-06-24 DIAGNOSIS — J439 Emphysema, unspecified: Secondary | ICD-10-CM | POA: Diagnosis not present

## 2016-06-24 MED ORDER — AZITHROMYCIN 250 MG PO TABS: ORAL_TABLET | ORAL | 0 refills | Status: DC

## 2016-06-24 MED ORDER — HYDROCODONE-HOMATROPINE 5-1.5 MG/5ML PO SYRP: 5.0000 mL | ORAL_SOLUTION | ORAL | 0 refills | Status: DC | PRN

## 2016-06-24 MED ORDER — PREDNISONE 10 MG PO TABS: ORAL_TABLET | ORAL | 0 refills | Status: DC

## 2016-06-24 NOTE — Assessment & Plan Note (Signed)
Early Bronchitis in COPD'er Plan: We will treat you with a prednisone taper. Prednisone taper; 10 mg tablets: 4 tabs x 2 days, 3 tabs x 2 days, 2 tabs x 2 days 1 tab x 2 days then stop. Mucinex DM Twice daily As needed  Cough and congestion  Hydromet  Cough syrup 5 cc's at night for cough up to every 4 hours. Delsym cough syrup during the day as needed for cough. Don't drive if sleepy.Marland Kitchen  Z-Pack take as directed Activia Yogurt with Probiotic or Probiotic tablets. ( Culturelle) Chest xray today .  Continue your Spiriva and rescue inhaler as you have been doing. Follow up with Dr. Lamonte Sakai as scheduled  Please contact office for sooner follow up if symptoms do not improve or worsen or seek emergency care

## 2016-06-24 NOTE — Progress Notes (Signed)
History of Present Illness Valerie Mosley is a 65 y.o. female former smoker ( Quit 2013) with COPD, s/p LUL lobectomy 12/17/11 (Dr Roxan Hockey) for adeno CA. She is followed by Dr. Lamonte Sakai.   06/24/2016 Acute OV for COPD/ Cough Pt. Presents to the office today for coughing which has worsened over the last 3 days. The cough is productive for clear to yellow secretions. She states she has slightly worse dyspnea. She has nasal congestion .She denies fever, chest pain, orthopnea, leg swelling or calf pain. She  is trying to be proactive and get treatment prior to developing full-fledged bronchitis per previous discussions with Dr. Lamonte Sakai. She has an appointment with Dr. Lamonte Sakai later this week, and prefers to keep that appointment despite having been seen today because that is her appointment for COPD follow-up and she recognizes it is hard to get on Dr. Lamonte Sakai schedule. When asked if she was still smoking, she stated no.   HPI 65 yo woman, history of tobacco use and COPD, status post left upper lobe lobectomy in April 2013 for adenocarcinoma of the lung.   PULMONARY FUNCTON TEST 09/19/2011  Peak Flow 130  FVCCOPD, s/p LUL lobectomy 12/17/11 (Dr Roxan Hockey) for adeno CA.  2.87  FEV1 1.94  FEV1/FVC 67.6  FVC  % Predicted 88  FEV % Predicted 81  FeF 25-75 1.04  FeF 25-75 % Predicted 2.65    Tests 06/24/2016 Chest x-ray  Suspected mild superimposed airways disease / bronchitis superimposed on chronic bronchitic change.  Past medical hx Past Medical History:  Diagnosis Date  . Cancer ([place])    Stage IA non-small cell lung cancer, left upper lobectomy 12/2011  . COPD (chronic obstructive pulmonary disease) ([place])   . Cough 07/29/11   started coughing up blood this am  . Cough   . Hypertension   . Kyphoscoliosis   . PONV (postoperative nausea and vomiting)   . Recurrent upper respiratory infection (URI)      Past surgical hx, Family hx, Social hx all reviewed.  Current Outpatient  Prescriptions on File Prior to Visit  Medication Sig  . acetaminophen (TYLENOL) 500 MG tablet Take 500 mg by mouth every 6 (six) hours as needed. For pain or fever  . albuterol (VENTOLIN HFA) 108 (90 Base) MCG/ACT inhaler Inhale 2 puffs into the lungs every 4 (four) hours as needed for wheezing or shortness of breath. For shortness of breath  . amLODipine (NORVASC) 10 MG tablet Take 10 mg by mouth daily.    Marland Kitchen b complex vitamins tablet Take 1 tablet by mouth daily.  . fluticasone (FLONASE) 50 MCG/ACT nasal spray Place 2 sprays into both nostrils daily.  Marland Kitchen loratadine (CLARITIN) 10 MG tablet Take 10 mg by mouth daily. For allergies  . sertraline (ZOLOFT) 25 MG tablet Take 25 mg by mouth at bedtime.  Marland Kitchen SPIRIVA HANDIHALER 18 MCG inhalation capsule inhale the contents of one capsule in the handihaler once daily  . HYDROcodone-homatropine (HYDROMET) 5-1.5 MG/5ML syrup Take 5 mLs by mouth every 6 (six) hours as needed. (Patient not taking: Reported on 06/24/2016)   No current facility-administered medications on file prior to visit.      No Known Allergies  Review Of Systems:  Constitutional:   No  weight loss, night sweats,  Fevers, chills, + fatigue, or  lassitude.  HEENT:   No headaches,  Difficulty swallowing,  Tooth/dental problems, or  + Sore throat,  No sneezing, itching, ear ache, +nasal congestion, +post nasal drip,   CV:  No chest pain,  Orthopnea, PND, swelling in lower extremities, anasarca, dizziness, palpitations, syncope.   GI  No heartburn, indigestion, abdominal pain, nausea, vomiting, diarrhea, change in bowel habits, loss of appetite, bloody stools.   Resp: + shortness of breath with exertion not  at rest.  + excess mucus, + productive cough,  + non-productive cough,  No coughing up of blood.  + change in color of mucus.  + wheezing.  No chest wall deformity  Skin: no rash or lesions.  GU: no dysuria, change in color of urine, no urgency or frequency.  No flank  pain, no hematuria   MS:  No joint pain or swelling.  No decreased range of motion.  No back pain.  Psych:  No change in mood or affect. No depression or anxiety.  No memory loss.   Vital Signs BP 108/70 (BP Location: Left Arm, Cuff Size: Normal)   Pulse 83   Temp 98.3 F (36.8 C) (Oral)   Ht '5\' 5"'$  (1.651 m)   Wt 129 lb (58.5 kg)   SpO2 97%   BMI 21.47 kg/m    Physical Exam:  General- No distress,  A&Ox3, pleasant ENT: No sinus tenderness, TM clear, pale nasal mucosa, no oral exudate,+ post nasal drip, no LAN Cardiac: S1, S2, regular rate and rhythm, no murmur Chest: + wheeze/ no rales/ dullness; no accessory muscle use, no nasal flaring, no sternal retractions Abd.: Soft Non-tender Ext: No clubbing cyanosis, edema Neuro:  normal strength Skin: No rashes, warm and dry Psych: normal mood and behavior   Assessment/Plan  Acute bronchitis Early Bronchitis in COPD'er Plan: We will treat you with a prednisone taper. Prednisone taper; 10 mg tablets: 4 tabs x 2 days, 3 tabs x 2 days, 2 tabs x 2 days 1 tab x 2 days then stop. Mucinex DM Twice daily As needed  Cough and congestion  Hydromet  Cough syrup 5 cc's at night for cough up to every 4 hours. Delsym cough syrup during the day as needed for cough. Don't drive if sleepy.Marland Kitchen  Z-Pack take as directed Activia Yogurt with Probiotic or Probiotic tablets. ( Culturelle) Chest xray today .  Continue your Spiriva and rescue inhaler as you have been doing. Follow up with Dr. Lamonte Sakai as scheduled  Please contact office for sooner follow up if symptoms do not improve or worsen or seek emergency care       Magdalen Spatz, NP 06/24/2016  3:48 PM

## 2016-06-24 NOTE — Patient Instructions (Addendum)
It is nice to meet you today. We will t.reat you with a prednisone taper. Prednisone taper; 10 mg tablets: 4 tabs x 2 days, 3 tabs x 2 days, 2 tabs x 2 days 1 tab x 2 days then stop. Mucinex DM Twice daily As needed  Cough and congestion  Hydromet  Cough syrup 5 cc's at night for cough up to every 4 hours. Delsym cough syrup during the day as needed for cough. Don't drive if sleepy.Marland Kitchen  Z-Pack take as directed Activia Yogurt with Probiotic or Probiotic tablets. ( Culturelle) Chest xray today .  Continue your Spiriva and rescue inhaler as you have been doing. Follow up with Dr. Lamonte Sakai as scheduled  Please contact office for sooner follow up if symptoms do not improve or worsen or seek emergency care

## 2016-06-24 NOTE — Telephone Encounter (Signed)
Rx sent. Pt notified.  Nothing further needed.  

## 2016-06-27 ENCOUNTER — Ambulatory Visit (INDEPENDENT_AMBULATORY_CARE_PROVIDER_SITE_OTHER): Payer: Medicare Other | Admitting: Emergency Medicine

## 2016-06-27 ENCOUNTER — Ambulatory Visit: Payer: Medicare Other | Admitting: Emergency Medicine

## 2016-06-27 ENCOUNTER — Encounter: Payer: Self-pay | Admitting: Emergency Medicine

## 2016-06-27 DIAGNOSIS — C3492 Malignant neoplasm of unspecified part of left bronchus or lung: Secondary | ICD-10-CM

## 2016-06-27 DIAGNOSIS — J449 Chronic obstructive pulmonary disease, unspecified: Secondary | ICD-10-CM | POA: Diagnosis not present

## 2016-06-27 NOTE — Patient Instructions (Addendum)
Please complete your prednisone and azithromycin Continue spiriva daily Take albuterol 2 puffs up to every 4 hours if needed for shortness of breath.  Get the flu shot in about 2 weeks from now.  Next CT scan chest of the chest is in April 2018 with Dr Roxan Hockey.  Follow with Dr Lamonte Sakai in 4 months or sooner if you have any problems.

## 2016-06-27 NOTE — Progress Notes (Signed)
Discussed with patient at ov w/ RB today 10/12

## 2016-06-27 NOTE — Assessment & Plan Note (Signed)
Her final CT scan of the chest for surveillance will be April 2018. Then follow w Dr Roxan Hockey. She may be a candidate for low-dose CT scan screening after that. We will discuss further.

## 2016-06-27 NOTE — Assessment & Plan Note (Signed)
With a recent exacerbation. She has been treated with azithromycin and prednisone with improvement. She is now close to her baseline. We will continue Spiriva, albuterol as needed. I would like for her to get the flu shot in about 2 weeks after this flare is cleared.

## 2016-06-27 NOTE — Progress Notes (Signed)
Subjective:    Patient ID: Valerie Mosley, female    DOB: [birth year], 65 y.o.   MRN: [id number] HPI 65 yo woman, history of tobacco use and COPD, status post left upper lobe lobectomy in April 2013 for adenocarcinoma of the lung.   PULMONARY FUNCTON TEST 09/19/2011  Peak Flow 130  FVC 2.87  FEV1 1.94  FEV1/FVC 67.6  FVC  % Predicted 88  FEV % Predicted 81  FeF 25-75 1.04  FeF 25-75 % Predicted 2.65     ROV 01/03/15 -- COPD, s/p LUL lobectomy 12/17/11 (Dr Roxan Hockey) for adeno CA. Following pulm nodules by CT with Dr Roxan Hockey, last scan was 12/27/14 > stable. She has undergone B cataract surgery since last time. She has been doing well, has been able to garden and walk her dog 3x a day. No flares since last time. She has had some cough in the night, especially in the Winter. She rarely uses albuterol. She is on loratadine qd, fluticasone nasal spray prn.   Acute OV 02/15/15 -- follow-up visit for severe COPD and history of left upper lobe lobectomy for adenocarcinoma the lung. I saw her last about 6 weeks ago.  She had been well until about 1 weeks ago - developed sore throat, some congestion, HA, cough. The cough is now productive of clear / cream. She has some dyspnea, some increased wheeze that is now a bit better. No known sick contacts.  She is using flonase prn, loratadine every day.   ROV 05/30/15 -- follow-up visit for severe COPD, history of upper lobectomy for a carcinoma of the lung. She has been treated in June for an acute exacerbation of her COPD. 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 She is on spiriva qd, uses loratadine and fluticasone. No other flares except for in June. 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 Very rarely needs SABA.   ROV 11/20/15 -- patient with a history of severe COPD and a prior upper lobectomy due to non-small cell lung cancer. She  was recently seen in our office by [facility] for acute exacerbation in the setting of an upper respirator infection. Her influenza A and B screens performed 5 days ago were negative. She was treated with tapering prednisone. She was dealing with HA, cough, clear mucous. She has taken pred as directed, continues to have nocturnal cough. Started azithro 3 days ago, finishing this. She is using robitussin DM. She is not having much wheeze. Some white thick mucous.   ROV 03/04/16 -- patient with a history of severe COPD, prior left upper lobectomy for adenocarcinoma the lung. She was seen in our office 11 days ago for acute exacerbation and treated with prednisone, Mucinex, Hydromet. Mainly had cough and SOB - may have been set off by exposure outside. Her last CT was April 2017 - stable nodules without evidence recurrent disease. She continues to garden and do housework. She is on spiriva, rarely uses ProAir (once every few weeks). Not smoking.   ROV 06/27/16 -- follow-up visit for history of severe COPD, history of left upper lobectomy for adenocarcinoma. Seen in our office 4 days ago for an acute exacerbation. Treated with a prednisone taper, azithro. She remains on both. She is improved. Has been using hydromet for sx relief. She feels better. Taking Spiriva. Using albuerol more during this flare, but not every day.  She has restarted flonase.    Objective:   Vitals:   06/27/16 1041  BP: 124/66  BP Location: Left Arm  Cuff Size: Normal  Pulse: 72  SpO2: 97%  Weight: 130 lb (59 kg)  Height: '5\' 5"'$  (1.651 m)    GEN: A/Ox3; pleasant , NAD, well nourished    HEENT:  [place]/AT,  EACs-clear, TMs-wnl, NOSE-clear drainage  THROAT-clear, no lesions, no postnasal drip or exudate noted.   NECK:  Supple w/ fair ROM; no JVD; normal carotid impulses w/o bruits; no thyromegaly or nodules palpated; no lymphadenopathy.    RESP  Clear  P & A; w/o, wheezes/ rales/ or rhonchi.  CARD:  RRR, no m/r/g  , no peripheral edema,  pulses intact, no cyanosis or clubbing.  Musco: Warm bil, no deformities or joint swelling noted.   Neuro: alert, no focal deficits noted.    Skin: Warm, no lesions or rashes   CT scan 12/27/14 --  IMPRESSION: 1. Stable right upper lobe pulmonary nodules. 2. Stable postsurgical change in the left hemi thorax. 3. Stable nodule over the left hemidiaphragm. 4. No new pulmonary nodularity or evidence of lung cancer recurrence. 5. Emphysematous change within the lungs.    COPD (chronic obstructive pulmonary disease) With a recent exacerbation. She has been treated with azithromycin and prednisone with improvement. She is now close to her baseline. We will continue Spiriva, albuterol as needed. I would like for her to get the flu shot in about 2 weeks after this flare is cleared.  Non-small cell lung cancer Her final CT scan of the chest for surveillance will be April 2018. Then follow w Dr Roxan Hockey. She may be a candidate for low-dose CT scan screening after that. We will discuss further.  Baltazar Apo, MD, PhD 06/27/2016, 11:13 AM [facility] [phone] or if no answer [phone]

## 2016-07-11 ENCOUNTER — Ambulatory Visit (INDEPENDENT_AMBULATORY_CARE_PROVIDER_SITE_OTHER): Payer: Medicare Other

## 2016-07-11 DIAGNOSIS — Z23 Encounter for immunization: Secondary | ICD-10-CM | POA: Diagnosis not present

## 2016-07-12 ENCOUNTER — Ambulatory Visit: Payer: Medicare Other

## 2016-07-23 ENCOUNTER — Ambulatory Visit: Payer: Medicare Other | Admitting: Emergency Medicine

## 2016-09-02 DIAGNOSIS — E785 Hyperlipidemia, unspecified: Secondary | ICD-10-CM | POA: Diagnosis not present

## 2016-09-02 DIAGNOSIS — F325 Major depressive disorder, single episode, in full remission: Secondary | ICD-10-CM | POA: Diagnosis not present

## 2016-09-02 DIAGNOSIS — I10 Essential (primary) hypertension: Secondary | ICD-10-CM | POA: Diagnosis not present

## 2016-09-02 DIAGNOSIS — J449 Chronic obstructive pulmonary disease, unspecified: Secondary | ICD-10-CM | POA: Diagnosis not present

## 2016-09-02 DIAGNOSIS — B354 Tinea corporis: Secondary | ICD-10-CM | POA: Diagnosis not present

## 2016-10-31 ENCOUNTER — Encounter: Payer: Self-pay | Admitting: Emergency Medicine

## 2016-10-31 ENCOUNTER — Ambulatory Visit (INDEPENDENT_AMBULATORY_CARE_PROVIDER_SITE_OTHER): Payer: Medicare Other | Admitting: Emergency Medicine

## 2016-10-31 DIAGNOSIS — C3492 Malignant neoplasm of unspecified part of left bronchus or lung: Secondary | ICD-10-CM | POA: Diagnosis not present

## 2016-10-31 DIAGNOSIS — J449 Chronic obstructive pulmonary disease, unspecified: Secondary | ICD-10-CM

## 2016-10-31 MED ORDER — TIOTROPIUM BROMIDE-OLODATEROL 2.5-2.5 MCG/ACT IN AERS: 2.0000 | INHALATION_SPRAY | Freq: Every day | RESPIRATORY_TRACT | 0 refills | Status: AC

## 2016-10-31 MED ORDER — HYDROCODONE-HOMATROPINE 5-1.5 MG/5ML PO SYRP: 5.0000 mL | ORAL_SOLUTION | ORAL | 0 refills | Status: DC | PRN

## 2016-10-31 NOTE — Assessment & Plan Note (Signed)
Fairly stable but with some interval slow progression. I'd like try a change to Stiolto to see if she gets more benefit. Follow 1 month to discuss. Also insure no desat on RA.   We will temporarily stop Spiriva Start Stiolto 2 sprays once a day for the next month to see if this helps your breathing  Take albuterol 2 puffs up to every 4 hours if needed for shortness of breath.  Walking oximetry today on room air  Follow with Dr Lamonte Sakai in 1 month

## 2016-10-31 NOTE — Patient Instructions (Addendum)
We will temporarily stop Spiriva Start Stiolto 2 sprays once a day for the next month to see if this helps your breathing  Take albuterol 2 puffs up to every 4 hours if needed for shortness of breath.  Walking oximetry today on room air  Next CT scan is April 2018 with Dr Roxan Hockey Follow with Dr Lamonte Sakai in 1 month

## 2016-10-31 NOTE — Progress Notes (Signed)
  Subjective:    Patient ID: Valerie Mosley, female    DOB: [birth year], 66 y.o.   MRN: [id number] HPI 66 yo woman, history of tobacco use and COPD, status post left upper lobe lobectomy in April 2013 for adenocarcinoma of the lung.   PULMONARY FUNCTON TEST 09/19/2011  Peak Flow 130  FVC 2.87  FEV1 1.94  FEV1/FVC 67.6  FVC  % Predicted 88  FEV % Predicted 81  FeF 25-75 1.04  FeF 25-75 % Predicted 2.65    ROV 06/27/16 -- follow-up visit for history of severe COPD, history of left upper lobectomy for adenocarcinoma. Seen in our office 4 days ago for an acute exacerbation. Treated with a prednisone taper, azithro. She remains on both. She is improved. Has been using hydromet for sx relief. She feels better. Taking Spiriva. Using albuerol more during this flare, but not every day. She has restarted flonase.   ROV 10/31/16 -- This follow-up visit for severe COPD, hx LUL lobectomy for adenoCA. She has ups and downs w her breathing that she believes vary with the weather.  Having more exertional SOB. She is on spiriva and albuterol prn, uses 1-2x a week. She is having cough at night. She is on flonase. No flares since time - no abx or pred.    Objective:   Vitals:   10/31/16 1033  BP: 124/68  BP Location: Left Arm  Cuff Size: Normal  Pulse: 96  SpO2: 95%  Weight: 126 lb 12.8 oz (57.5 kg)  Height: '5\' 5"'$  (1.651 m)    GEN: A/Ox3; pleasant , NAD, well nourished    HEENT:  [place]/AT,  EACs-clear, TMs-wnl, NOSE-clear drainage  THROAT-clear, no lesions, no postnasal drip or exudate noted.   NECK:  Supple w/ fair ROM; no JVD; normal carotid impulses w/o bruits; no thyromegaly or nodules palpated; no lymphadenopathy.    RESP  Clear  P & A; w/o, wheezes/ rales/ or rhonchi.  CARD:  RRR, no m/r/g  , no peripheral edema, pulses intact, no cyanosis or clubbing.  Musco: Warm bil, no deformities or joint swelling noted.   Neuro: alert, no focal deficits noted.    Skin: Warm, no lesions or  rashes   CT scan 12/27/14 --  IMPRESSION: 1. Stable right upper lobe pulmonary nodules. 2. Stable postsurgical change in the left hemi thorax. 3. Stable nodule over the left hemidiaphragm. 4. No new pulmonary nodularity or evidence of lung cancer recurrence. 5. Emphysematous change within the lungs.    COPD (chronic obstructive pulmonary disease) Fairly stable but with some interval slow progression. I'd like try a change to Stiolto to see if she gets more benefit. Follow 1 month to discuss. Also insure no desat on RA.   We will temporarily stop Spiriva Start Stiolto 2 sprays once a day for the next month to see if this helps your breathing  Take albuterol 2 puffs up to every 4 hours if needed for shortness of breath.  Walking oximetry today on room air  Follow with Dr Lamonte Sakai in 1 month  Non-small cell lung cancer Next CT scan is April 2018 with Dr Ysidro Evert, MD, PhD 10/31/2016, 11:03 AM [facility] [phone] or if no answer [phone]

## 2016-10-31 NOTE — Addendum Note (Signed)
Addended by: Maryanna Shape A on: 10/31/2016 11:35 AM   Modules accepted: Orders

## 2016-10-31 NOTE — Addendum Note (Signed)
Addended by: Maryanna Shape A on: 10/31/2016 11:18 AM   Modules accepted: Orders

## 2016-10-31 NOTE — Assessment & Plan Note (Signed)
Next CT scan is April 2018 with Dr Roxan Hockey

## 2016-12-11 ENCOUNTER — Ambulatory Visit (INDEPENDENT_AMBULATORY_CARE_PROVIDER_SITE_OTHER): Payer: Medicare Other | Admitting: Emergency Medicine

## 2016-12-11 ENCOUNTER — Encounter: Payer: Self-pay | Admitting: Emergency Medicine

## 2016-12-11 DIAGNOSIS — C3492 Malignant neoplasm of unspecified part of left bronchus or lung: Secondary | ICD-10-CM | POA: Diagnosis not present

## 2016-12-11 DIAGNOSIS — J449 Chronic obstructive pulmonary disease, unspecified: Secondary | ICD-10-CM

## 2016-12-11 MED ORDER — TIOTROPIUM BROMIDE-OLODATEROL 2.5-2.5 MCG/ACT IN AERS: 2.0000 | INHALATION_SPRAY | Freq: Every day | RESPIRATORY_TRACT | 3 refills | Status: DC

## 2016-12-11 NOTE — Assessment & Plan Note (Signed)
We will send a script for Stiolto to your pharmacy. If it is affordable then we will continue it. If not then we can change back to Spiriva.  Use albuterol 2 puffs up to every 4 hours if needed for shortness of breath.  Follow with Dr Lamonte Sakai in 4 months or sooner if you have any problems.

## 2016-12-11 NOTE — Patient Instructions (Addendum)
We will send a script for Stiolto to your pharmacy. If it is affordable then we will continue it. If not then we can change back to Spiriva.  Use albuterol 2 puffs up to every 4 hours if needed for shortness of breath.  Follow with Dr Roxan Hockey in April as planned. CT scan as planned.  Follow with Dr Lamonte Sakai in 4 months or sooner if you have any problems.

## 2016-12-11 NOTE — Addendum Note (Signed)
Addended by: Jannette Spanner on: 12/11/2016 04:20 PM   Modules accepted: Orders

## 2016-12-11 NOTE — Assessment & Plan Note (Signed)
This year will be her 5th year post-op  Follow with Dr Roxan Hockey in April as planned. CT scan as planned.

## 2016-12-11 NOTE — Progress Notes (Signed)
  Subjective:    Patient ID: Valerie Mosley, female    DOB: [birth year], 66 y.o.   MRN: [id number] HPI 66 yo woman, history of tobacco use and COPD, status post left upper lobe lobectomy in April 2013 for adenocarcinoma of the lung.   PULMONARY FUNCTON TEST 09/19/2011  Peak Flow 130  FVC 2.87  FEV1 1.94  FEV1/FVC 67.6  FVC  % Predicted 88  FEV % Predicted 81  FeF 25-75 1.04  FeF 25-75 % Predicted 2.65    ROV 06/27/16 -- follow-up visit for history of severe COPD, history of left upper lobectomy for adenocarcinoma. Seen in our office 4 days ago for an acute exacerbation. Treated with a prednisone taper, azithro. She remains on both. She is improved. Has been using hydromet for sx relief. She feels better. Taking Spiriva. Using albuerol more during this flare, but not every day. She has restarted flonase.   ROV 10/31/16 -- This follow-up visit for severe COPD, hx LUL lobectomy for adenoCA. She has ups and downs w her breathing that she believes vary with the weather.  Having more exertional SOB. She is on spiriva and albuterol prn, uses 1-2x a week. She is having cough at night. She is on flonase. No flares since time - no abx or pred.   ROV 12/11/16 -- patient has a history of severe COPD, left upper lobe lobectomy for adenocarcinoma. At our last visit we tried changing her Spiriva to [employer]. She is doing well. She is very excited to get to play with her 68 yo grandson. She hasn't noticed much change on the [place], she prefers the [employer] and its delivery. Minimal cough, some wheeze.    Objective:   Vitals:   12/11/16 1519  BP: 130/78  Pulse: 81  SpO2: 97%  Weight: 129 lb (58.5 kg)  Height: '5\' 5"'$  (1.651 m)    GEN: A/Ox3; pleasant , NAD, well nourished    HEENT:  [place]/AT,  EACs-clear, TMs-wnl, NOSE-clear drainage  THROAT-clear, no lesions, no postnasal drip or exudate noted.   NECK:  Supple w/ fair ROM; no JVD; normal carotid impulses w/o bruits; no thyromegaly or nodules palpated; no  lymphadenopathy.    RESP  Clear  P & A; w/o, wheezes/ rales/ or rhonchi.  CARD:  RRR, no m/r/g  , no peripheral edema, pulses intact, no cyanosis or clubbing.  Musco: Warm bil, no deformities or joint swelling noted.   Neuro: alert, no focal deficits noted.    Skin: Warm, no lesions or rashes   CT scan 12/27/14 --  IMPRESSION: 1. Stable right upper lobe pulmonary nodules. 2. Stable postsurgical change in the left hemi thorax. 3. Stable nodule over the left hemidiaphragm. 4. No new pulmonary nodularity or evidence of lung cancer recurrence. 5. Emphysematous change within the lungs.    Non-small cell lung cancer This year will be her 5th year post-op  Follow with Dr Roxan Hockey in April as planned. CT scan as planned.   COPD (chronic obstructive pulmonary disease) We will send a script for Stiolto to your pharmacy. If it is affordable then we will continue it. If not then we can change back to Spiriva.  Use albuterol 2 puffs up to every 4 hours if needed for shortness of breath.  Follow with Dr Lamonte Sakai in 4 months or sooner if you have any problems.  Baltazar Apo, MD, PhD 12/11/2016, 4:10 PM [facility] [phone] or if no answer [phone]

## 2016-12-16 ENCOUNTER — Ambulatory Visit (INDEPENDENT_AMBULATORY_CARE_PROVIDER_SITE_OTHER): Payer: Medicare Other | Admitting: Pulmonary Disease

## 2016-12-16 ENCOUNTER — Encounter: Payer: Self-pay | Admitting: Pulmonary Disease

## 2016-12-16 DIAGNOSIS — J449 Chronic obstructive pulmonary disease, unspecified: Secondary | ICD-10-CM | POA: Diagnosis not present

## 2016-12-16 DIAGNOSIS — J301 Allergic rhinitis due to pollen: Secondary | ICD-10-CM | POA: Diagnosis not present

## 2016-12-16 MED ORDER — MONTELUKAST SODIUM 10 MG PO TABS: 10.0000 mg | ORAL_TABLET | Freq: Every day | ORAL | 3 refills | Status: DC

## 2016-12-16 MED ORDER — PREDNISONE 10 MG PO TABS: ORAL_TABLET | ORAL | 0 refills | Status: DC

## 2016-12-16 NOTE — Assessment & Plan Note (Signed)
Recent cough, congestion, scratchy throat, wheezing. Happens every year during Easter. Related to allergies. No sick contacts. No fevers, chills. She chronically takes Zyrtec daily. No wheezing on exam. Prednisone taper. 20 mg a day for 1 week followed by 10 mg a day for 1 week. Try Singulair, 10 mg at bedtime. Try to wean off Singulair after 1 month. Told her to take the Claritin in the morning. May be next year, weeks before Easter, she can re-start Singulair if she ends up being off that medicine.  Patient advised to call if not better.

## 2016-12-16 NOTE — Progress Notes (Signed)
Subjective:    Patient ID: Valerie Mosley, female    DOB: 03/04/1951, 66 y.o.   MRN: [id number]  HPI Patient is a 66 year old woman, being seen for COPD, status post left upper lobe lobectomy for adenocarcinoma of the lung, being seen today urgently for cough. She was last seen on 12/11/2016.  She had a scratchy throat last Friday. Coincided with exposure to pollen. With some wheezing, cough, dyspnea. Cough is dry.   Husband has also been having cough with allergies x 10 days.  (-) fevers, chills.  (-) sick contacts.   Uses spiriva.   Review of Systems  Constitutional: Negative.   HENT: Negative.   Eyes: Negative.   Respiratory: Positive for cough and shortness of breath.   Cardiovascular: Negative.   Gastrointestinal: Negative.   Endocrine: Negative.   Genitourinary: Negative.   Musculoskeletal: Negative.   Skin: Negative.   Allergic/Immunologic: Negative.   Neurological: Negative.   Hematological: Negative.   Psychiatric/Behavioral: Negative.        Objective:   Physical Exam  Vitals:  Vitals:   12/16/16 1340  BP: 108/70  Pulse: 100  Temp: 98.6 F (37 C)  TempSrc: Oral  SpO2: 95%  Weight: 129 lb 12.8 oz (58.9 kg)  Height: '5\' 5"'$  (1.651 m)    Constitutional/General:  Pleasant, well-nourished, well-developed, not in any distress,  Comfortably seating.  Well kempt  Body mass index is 21.6 kg/m. Wt Readings from Last 3 Encounters:  12/16/16 129 lb 12.8 oz (58.9 kg)  12/11/16 129 lb (58.5 kg)  10/31/16 126 lb 12.8 oz (57.5 kg)      HEENT: Pupils equal and reactive to light and accommodation. Anicteric sclerae. Normal nasal mucosa.   No oral  lesions,  mouth clear,  oropharynx clear, no postnasal drip. (-) Oral thrush. No dental caries.  Airway - Mallampati class III  Neck: No masses. Midline trachea. No JVD, (-) LAD. (-) bruits appreciated.  Respiratory/Chest: Grossly normal chest. (-) deformity. (-) Accessory muscle use.  Symmetric expansion. (-)  Tenderness on palpation.  Resonant on percussion.  Diminished BS on both lower lung zones. (-) wheezing, crackles, rhonchi (-) egophony  Cardiovascular: Regular rate and  rhythm, heart sounds normal, no murmur or gallops, no peripheral edema  Gastrointestinal:  Normal bowel sounds. Soft, non-tender. No hepatosplenomegaly.  (-) masses.   Musculoskeletal:  Normal muscle tone. Normal gait.   Extremities: Grossly normal. (-) clubbing, cyanosis.  (-) edema  Skin: (-) rash,lesions seen.   Neurological/Psychiatric : alert, oriented to time, place, person. Normal mood and affect         Assessment & Plan:  Allergic rhinitis Recent cough, congestion, scratchy throat, wheezing. Happens every year during Easter. Related to allergies. No sick contacts. No fevers, chills. She chronically takes Zyrtec daily. No wheezing on exam. Prednisone taper. 20 mg a day for 1 week followed by 10 mg a day for 1 week. Try Singulair, 10 mg at bedtime. Try to wean off Singulair after 1 month. Told her to take the Claritin in the morning. May be next year, weeks before Easter, she can re-start Singulair if she ends up being off that medicine.  Patient advised to call if not better.  COPD (chronic obstructive pulmonary disease) Cont spiriva. She does not seem to be in acute exacerbation of COPD. No difference between Spiriva or stiolto.    Return to clinicas scheduled.   Monica Becton, MD 12/16/2016, 2:06 PM [facility] Pager [phone]  [phone] After 3 pm or if no answer, call [phone]

## 2016-12-16 NOTE — Assessment & Plan Note (Signed)
Cont spiriva. She does not seem to be in acute exacerbation of COPD. No difference between Spiriva or stiolto.

## 2016-12-16 NOTE — Patient Instructions (Signed)
It was a pleasure taking care of you today!  You are diagnosed with allergic bronchitis. We will start you on prednisone, 10 mg per tablet, 2 tablets a day for 1 week followed by 1 tablet a day for 1 week. We will start you on Singulair 10 mg/tab, 1 tablet at bedtime. Try to wean this off after 1 month. Take Zyrtec in the morning. Cont Spiriva.   Return to clinic as scheduled to see Dr. Lamonte Sakai.

## 2016-12-16 NOTE — Addendum Note (Signed)
Addended by: Benson Setting L on: 12/16/2016 02:28 PM   Modules accepted: Orders

## 2016-12-17 ENCOUNTER — Telehealth: Payer: Self-pay

## 2016-12-17 NOTE — Telephone Encounter (Signed)
[facility] sent a fax to change Stiloto to [employer] or we have to do a prior authorization. RB which do you prefer? In your note, you stated if this drug was not covered then you would switch back to Spiriva. Please advise.

## 2016-12-19 NOTE — Telephone Encounter (Signed)
Mistake below -  I meant that it is OK to go back to Spiriva (not Anoro) Find out which one it was, respimat or handi-haler.  thanks

## 2016-12-19 NOTE — Telephone Encounter (Signed)
According to her records, she was always on the handi haler  Is this okay or were you wanting to switch to respimat?

## 2016-12-19 NOTE — Telephone Encounter (Signed)
Ok to change her back to her Anoro - need to confirm with her was it Respimat or handi-haler.

## 2016-12-20 ENCOUNTER — Telehealth: Payer: Self-pay | Admitting: Pulmonary Disease

## 2016-12-20 MED ORDER — TIOTROPIUM BROMIDE MONOHYDRATE 18 MCG IN CAPS: ORAL_CAPSULE | RESPIRATORY_TRACT | 5 refills | Status: DC

## 2016-12-20 MED ORDER — RANITIDINE HCL 150 MG PO TABS: 150.0000 mg | ORAL_TABLET | Freq: Two times a day (BID) | ORAL | 0 refills | Status: DC

## 2016-12-20 NOTE — Telephone Encounter (Signed)
Spoke with pt, aware of recs.  rx sent to preferred pharmacy.  Nothing further needed.  

## 2016-12-20 NOTE — Telephone Encounter (Signed)
   Can she try 10 mg BID? If not maybe 15 mg pred in the morning? If not, stay on the 10 mg.   Is she on claritin and singulair? If no, ple order as I told her.  Add zantac 150 BID.   Take mucinex for congestion.   If not better, let her call back next week.    Monica Becton, MD 12/20/2016, 10:37 AM [facility] Pager [phone] After 3 pm or if no answer, call [phone]

## 2016-12-20 NOTE — Telephone Encounter (Signed)
Pt states that '20mg'$  prednisone is keeping her up at night.  Pt took '10mg'$  prednisone yesterday and was able to sleep, but notices that her chest congestion feels worse today after taking only '10mg'$  prednisone.  Pt is taking prednisone before breakfast qam (before 8:00).  Pt requesting further recs.   Pt uses [facility].   AD please advise on further recs.  Thanks.

## 2016-12-20 NOTE — Telephone Encounter (Signed)
Spoke with the pt and notified of recs per AD and she verbalized understanding  Rx for zantac was sent  She is taking the calritin and singuair already  Nothing further needed

## 2016-12-20 NOTE — Telephone Encounter (Signed)
She can go back to handi-haler as she was taking before.

## 2016-12-24 ENCOUNTER — Other Ambulatory Visit: Payer: Self-pay | Admitting: *Deleted

## 2016-12-24 DIAGNOSIS — R918 Other nonspecific abnormal finding of lung field: Secondary | ICD-10-CM

## 2016-12-24 DIAGNOSIS — Z85118 Personal history of other malignant neoplasm of bronchus and lung: Secondary | ICD-10-CM

## 2016-12-26 ENCOUNTER — Telehealth: Payer: Self-pay | Admitting: Emergency Medicine

## 2016-12-26 NOTE — Telephone Encounter (Signed)
CMM's aware that we have started PA and need to wait 72 hours for approval/denial. Will forward to LCL box to look out for letter.

## 2016-12-27 NOTE — Telephone Encounter (Signed)
PA approved for [employer].

## 2017-01-21 ENCOUNTER — Encounter: Payer: Self-pay | Admitting: Thoracic Surgery (Cardiothoracic Vascular Surgery)

## 2017-01-21 ENCOUNTER — Ambulatory Visit
Admission: RE | Admit: 2017-01-21 | Discharge: 2017-01-21 | Disposition: A | Discharge status: Home / Self Care | Payer: Medicare Other | Source: Ambulatory Visit | Attending: Thoracic Surgery (Cardiothoracic Vascular Surgery) | Admitting: Thoracic Surgery (Cardiothoracic Vascular Surgery)

## 2017-01-21 ENCOUNTER — Ambulatory Visit (INDEPENDENT_AMBULATORY_CARE_PROVIDER_SITE_OTHER): Payer: Medicare Other | Admitting: Thoracic Surgery (Cardiothoracic Vascular Surgery)

## 2017-01-21 VITALS — BP 101/64 | HR 64 | Resp 20 | Ht 65.0 in | Wt 125.0 lb

## 2017-01-21 DIAGNOSIS — Z85118 Personal history of other malignant neoplasm of bronchus and lung: Secondary | ICD-10-CM

## 2017-01-21 DIAGNOSIS — R918 Other nonspecific abnormal finding of lung field: Secondary | ICD-10-CM | POA: Diagnosis not present

## 2017-01-21 DIAGNOSIS — Z902 Acquired absence of lung [part of]: Secondary | ICD-10-CM | POA: Diagnosis not present

## 2017-01-21 NOTE — Progress Notes (Signed)
[address]       [place] [address]             [phone]    HPI: [name] returns for a 5 year follow-up visit.  She is a 66 year old woman who had a thoracoscopic left upper lobectomy and April 2013 for stage IA non-small cell carcinoma. She did have a long history of tobacco abuse leading up to that procedure. She quit smoking after surgery and has not smoked since then.  She has had multiple small right lung nodules and a nodule at the base of the left lower lobe that we have been following with CT scan. They've been stable since 2016.  She says that recently she's been having more problems with coughing and wheezing and congestion due to pollen. Prior to that her respiratory status had been stable. Her appetite is good and she has not had any weight loss. She's not had any unusual headaches or visual changes. Past Medical History:  Diagnosis Date  . Cancer ([place])    Stage IA non-small cell lung cancer, left upper lobectomy 12/2011  . COPD (chronic obstructive pulmonary disease) ([place])   . Cough 07/29/11   started coughing up blood this am  . Cough   . Hypertension   . Kyphoscoliosis   . PONV (postoperative nausea and vomiting)   . Recurrent upper respiratory infection (URI)     Current Outpatient Prescriptions  Medication Sig Dispense Refill  . acetaminophen (TYLENOL) 500 MG tablet Take 500 mg by mouth every 6 (six) hours as needed. For pain or fever    . albuterol (VENTOLIN HFA) 108 (90 Base) MCG/ACT inhaler Inhale 2 puffs into the lungs every 4 (four) hours as needed for wheezing or shortness of breath. For shortness of breath 8.51 g 5  . amLODipine (NORVASC) 10 MG tablet Take 10 mg by mouth daily.      Marland [employer] b complex vitamins tablet Take 1 tablet by mouth daily.    . fluticasone (FLONASE) 50 MCG/ACT nasal spray Place 2 sprays into both nostrils daily. 16 g 5  . HYDROcodone-homatropine (HYCODAN) 5-1.5 MG/5ML syrup Take 5 mLs by mouth every 4 (four)  hours as needed for cough. 240 mL 0  . loratadine (CLARITIN) 10 MG tablet Take 10 mg by mouth daily. For allergies    . montelukast (SINGULAIR) 10 MG tablet Take 1 tablet (10 mg total) by mouth at bedtime. 30 tablet 3  . ranitidine (ZANTAC) 150 MG tablet Take 1 tablet (150 mg total) by mouth 2 (two) times daily. 60 tablet 0  . sertraline (ZOLOFT) 25 MG tablet Take 25 mg by mouth at bedtime.    Marland Kitchen tiotropium (SPIRIVA HANDIHALER) 18 MCG inhalation capsule inhale the contents of one capsule in the handihaler once daily 30 capsule 5   No current facility-administered medications for this visit.     Physical Exam BP 101/64   Pulse 64   Resp 20   Ht '5\' 5"'$  (1.651 m)   Wt 125 lb (56.7 kg)   BMI 20.65 kg/m  66 year old woman in no acute distress. Alert and oriented 3 with no focal neurologic deficits Kyphosis Lungs diminished bilaterally left greater than right, no wheezing Cardiac regular rate and rhythm normal S1 and S2 No cervical or supraclavicular adenopathy  Diagnostic Tests: CT CHEST WITHOUT CONTRAST  TECHNIQUE: Multidetector CT imaging of the chest was performed following the standard protocol without IV contrast.  COMPARISON:  01/09/2016  FINDINGS: Cardiovascular:  The heart size is normal. No pericardial effusion. Coronary artery calcification is noted. Atherosclerotic calcification is noted in the wall of the thoracic aorta.  Mediastinum/Nodes: No mediastinal lymphadenopathy. No evidence for gross hilar lymphadenopathy although assessment is limited by the lack of intravenous contrast on today's study. The esophagus has normal imaging features. There is no axillary lymphadenopathy. Nodule in the thyroid isthmus is stable.  Lungs/Pleura: Centrilobular paraseptal emphysema again noted with bronchial wall thickening. Several anterior right upper lobe pulmonary nodules are again identified. The most lateral of these nodules measures 7 mm today compared 8 mm  previously. The most anterior nodule is stable at 5 mm (image 48 series 4). Subpleural nodule seen medially on image 48 measures 6 mm today which is unchanged. Previous identified nodule at the peripheral left lung base measures 11 mm today in the same dimension it was measured previously at 11 mm. This is associated with some subpleural scarring in the lung parenchyma. No new or progressive pulmonary nodule or mass. No focal airspace consolidation. No pulmonary edema or pleural effusion.  Upper Abdomen: No change multiple hepatic cysts. Stable nonobstructing left renal stone.  Musculoskeletal: Bone windows reveal no worrisome lytic or sclerotic osseous lesions.  IMPRESSION: 1. Stable exam. Three discrete nodules in the same region of the right upper lobe are unchanged in the interval since the most recent comparison study and similar to the exam from 12/27/2014, suggesting benign etiology. 2. Emphysema. 3. Coronary artery and thoracic aortic atherosclerosis.   Electronically Signed   By: Misty Stanley M.D.   On: 01/21/2017 10:53 I personally reviewed the CT chest and concur with the findings noted above  Impression: [name] is a 66 year old woman who had a thoracoscopic left upper lobectomy 5 years ago for stage IA non-small cell carcinoma. She has no evidence of recurrent disease. She is considered cured from her lung cancer.  She does have all of pulmonary nodules. These are stable dating back to 2016. They are unlikely to be malignant. However given her smoking history I think these need continued follow-up. I'll plan to do a repeat CT in one year.  She does meet criteria for lung cancer screening, however given the presence of existing nodules we will plan to do a normal resolution CT.  CAD- evidence on CT scan. Asymptomatic.  Plan: Return in one year with CT chest.  [name], MD [facility] [phone]

## 2017-03-13 DIAGNOSIS — C3490 Malignant neoplasm of unspecified part of unspecified bronchus or lung: Secondary | ICD-10-CM | POA: Diagnosis not present

## 2017-03-13 DIAGNOSIS — M8588 Other specified disorders of bone density and structure, other site: Secondary | ICD-10-CM | POA: Diagnosis not present

## 2017-03-13 DIAGNOSIS — Z1389 Encounter for screening for other disorder: Secondary | ICD-10-CM | POA: Diagnosis not present

## 2017-03-13 DIAGNOSIS — F325 Major depressive disorder, single episode, in full remission: Secondary | ICD-10-CM | POA: Diagnosis not present

## 2017-03-13 DIAGNOSIS — G4700 Insomnia, unspecified: Secondary | ICD-10-CM | POA: Diagnosis not present

## 2017-03-13 DIAGNOSIS — E785 Hyperlipidemia, unspecified: Secondary | ICD-10-CM | POA: Diagnosis not present

## 2017-03-13 DIAGNOSIS — Z0000 Encounter for general adult medical examination without abnormal findings: Secondary | ICD-10-CM | POA: Diagnosis not present

## 2017-03-13 DIAGNOSIS — J449 Chronic obstructive pulmonary disease, unspecified: Secondary | ICD-10-CM | POA: Diagnosis not present

## 2017-03-13 DIAGNOSIS — I10 Essential (primary) hypertension: Secondary | ICD-10-CM | POA: Diagnosis not present

## 2017-03-14 ENCOUNTER — Other Ambulatory Visit: Payer: Self-pay | Admitting: Family Medicine

## 2017-03-14 DIAGNOSIS — M8580 Other specified disorders of bone density and structure, unspecified site: Secondary | ICD-10-CM

## 2017-03-26 ENCOUNTER — Other Ambulatory Visit: Payer: Medicare Other

## 2017-04-03 ENCOUNTER — Other Ambulatory Visit: Payer: Medicare Other

## 2017-04-04 ENCOUNTER — Ambulatory Visit
Admission: RE | Admit: 2017-04-04 | Discharge: 2017-04-04 | Disposition: A | Discharge status: Home / Self Care | Payer: Medicare Other | Source: Ambulatory Visit | Attending: Family Medicine | Admitting: Family Medicine

## 2017-04-04 DIAGNOSIS — M8580 Other specified disorders of bone density and structure, unspecified site: Secondary | ICD-10-CM

## 2017-04-04 DIAGNOSIS — Z780 Asymptomatic menopausal state: Secondary | ICD-10-CM | POA: Diagnosis not present

## 2017-04-04 DIAGNOSIS — M810 Age-related osteoporosis without current pathological fracture: Secondary | ICD-10-CM | POA: Diagnosis not present

## 2017-04-08 ENCOUNTER — Ambulatory Visit (INDEPENDENT_AMBULATORY_CARE_PROVIDER_SITE_OTHER): Payer: Medicare Other | Admitting: Emergency Medicine

## 2017-04-08 ENCOUNTER — Encounter: Payer: Self-pay | Admitting: Emergency Medicine

## 2017-04-08 DIAGNOSIS — C3492 Malignant neoplasm of unspecified part of left bronchus or lung: Secondary | ICD-10-CM | POA: Diagnosis not present

## 2017-04-08 DIAGNOSIS — J449 Chronic obstructive pulmonary disease, unspecified: Secondary | ICD-10-CM

## 2017-04-08 DIAGNOSIS — J301 Allergic rhinitis due to pollen: Secondary | ICD-10-CM

## 2017-04-08 NOTE — Assessment & Plan Note (Signed)
Followed with serial CT scans of the chest.

## 2017-04-08 NOTE — Assessment & Plan Note (Signed)
Please continue your Spiriva once a day Take albuterol 2 puffs up to every 4 hours if needed for shortness of breath.

## 2017-04-08 NOTE — Progress Notes (Signed)
Subjective:    Patient ID: Valerie Mosley, female    DOB: [birth year], 66 y.o.   MRN: [id number] HPI 66 yo woman, history of tobacco use and COPD, status post left upper lobe lobectomy in April 2013 for adenocarcinoma of the lung.   PULMONARY FUNCTON TEST 09/19/2011  Peak Flow 130  FVC 2.87  FEV1 1.94  FEV1/FVC 67.6  FVC  % Predicted 88  FEV % Predicted 81  FeF 25-75 1.04  FeF 25-75 % Predicted 2.65    ROV 06/27/16 -- follow-up visit for history of severe COPD, history of left upper lobectomy for adenocarcinoma. Seen in our office 4 days ago for an acute exacerbation. Treated with a prednisone taper, azithro. She remains on both. She is improved. Has been using hydromet for sx relief. She feels better. Taking Spiriva. Using albuerol more during this flare, but not every day. She has restarted flonase.   ROV 10/31/16 -- This follow-up visit for severe COPD, hx LUL lobectomy for adenoCA. She has ups and downs w her breathing that she believes vary with the weather.  Having more exertional SOB. She is on spiriva and albuterol prn, uses 1-2x a week. She is having cough at night. She is on flonase. No flares since time - no abx or pred.   ROV 12/11/16 -- patient has a history of severe COPD, left upper lobe lobectomy for adenocarcinoma. At our last visit we tried changing her Spiriva to [employer]. She is doing well. She is very excited to get to play with her 31 yo grandson. She hasn't noticed much change on the [place], she prefers the [employer] and its delivery. Minimal cough, some wheeze.   ROV 04/08/17 -- This follow-up visit for patient with a history of severe COPD and a left upper lobectomy for prior adenocarcinoma. He was seen in our office for acute visit in April in the setting of cough. She was treated with prednisone taper and Singulair was added to her allergy regimen. She is on loratadine, now off the singulair. Uses flonase prn. Her insurance wouldn't cover [employer], went back to Spiriva. She  isn't sure that the Stiolto was much better. Her most recent CT scan of the chest was performed in May 2018 that I have reviewed. She follows with Dr. Roxan Hockey. There was no evidence of local recurrence. She had stable right upper lobe nodules that were unchanged compared with April 2016.  Objective:   Vitals:   04/08/17 1201  BP: 100/68  Pulse: 86  SpO2: 97%  Weight: 116 lb (52.6 kg)  Height: 5\' 4"  (1.626 m)    GEN: A/Ox3; pleasant , NAD, well nourished    HEENT:  [place]/AT,  EACs-clear, TMs-wnl, NOSE-clear drainage  THROAT-clear, no lesions, no postnasal drip or exudate noted.   NECK:  No JVD, no stridor   RESP  decreased bilaterally, no wheezing, no crackles  CARD:  RRR, no m/r/g  , no peripheral edema, pulses intact, no cyanosis or clubbing.  Musco: Warm bil, no deformities or joint swelling noted.   Neuro: alert, no focal deficits noted.    Skin: Warm, no lesions or rashes      COPD (chronic obstructive pulmonary disease) Please continue your Spiriva once a day Take albuterol 2 puffs up to every 4 hours if needed for shortness of breath.   Non-small cell lung cancer Followed with serial CT scans of the chest.  Allergic rhinitis Continue loratadine 10 mg (Claritin) once a day Use your fluticasone nasal spray 2 sprays  each nostril as needed. In September please start taking this medication every day in preparation for allergy season. In September please start Singulair 10 mg every evening Flu shot in the fall Follow with Dr Lamonte Sakai in 3 months or sooner if you have any problems.  Baltazar Apo, MD, PhD 04/08/2017, 12:24 PM [facility] [phone] or if no answer [phone]

## 2017-04-08 NOTE — Assessment & Plan Note (Signed)
Continue loratadine 10 mg (Claritin) once a day Use your fluticasone nasal spray 2 sprays each nostril as needed. In September please start taking this medication every day in preparation for allergy season. In September please start Singulair 10 mg every evening Flu shot in the fall Follow with Dr Lamonte Sakai in 3 months or sooner if you have any problems.

## 2017-04-08 NOTE — Patient Instructions (Addendum)
Please continue your Spiriva once a day Take albuterol 2 puffs up to every 4 hours if needed for shortness of breath.  Continue loratadine 10 mg (Claritin) once a day Use your fluticasone nasal spray 2 sprays each nostril as needed. In September please start taking this medication every day in preparation for allergy season. In September please start Singulair 10 mg every evening Flu shot in the fall Follow with Dr Lamonte Sakai in 3 months or sooner if you have any problems.

## 2017-04-15 ENCOUNTER — Telehealth: Payer: Self-pay | Admitting: Emergency Medicine

## 2017-04-15 MED ORDER — PREDNISONE 10 MG PO TABS: ORAL_TABLET | ORAL | 0 refills | Status: DC

## 2017-04-15 NOTE — Telephone Encounter (Signed)
Called and spoke to pt. Pt c/o increase in SOB, prod cough with yellow mucus, chest tightness, headache x 2 days. Pt denies f/c/s. Pt states she is taking tylenol prn for headache. Pt is taking medications as prescribed. Pt last seen on 04/08/2017 by RB.   Dr. Lamonte Sakai please advise. Thanks.

## 2017-04-15 NOTE — Telephone Encounter (Signed)
Called and spoke with pt and she is aware of RB recs.  pred has been sent to the pharmacy and nothing further is needed.

## 2017-04-15 NOTE — Telephone Encounter (Signed)
Ok to start pred > Take 40mg  daily for 3 days, then 30mg  daily for 3 days, then 20mg  daily for 3 days, then 10mg  daily for 3 days, then stop If no better then needs to be seen at [facility] end of this week.

## 2017-04-18 ENCOUNTER — Encounter: Payer: Self-pay | Admitting: Pulmonary Disease

## 2017-04-18 ENCOUNTER — Ambulatory Visit (INDEPENDENT_AMBULATORY_CARE_PROVIDER_SITE_OTHER): Payer: Medicare Other | Admitting: Pulmonary Disease

## 2017-04-18 ENCOUNTER — Ambulatory Visit (INDEPENDENT_AMBULATORY_CARE_PROVIDER_SITE_OTHER)
Admission: RE | Admit: 2017-04-18 | Discharge: 2017-04-18 | Disposition: A | Discharge status: Home / Self Care | Payer: Medicare Other | Source: Ambulatory Visit | Attending: Pulmonary Disease | Admitting: Pulmonary Disease

## 2017-04-18 VITALS — BP 136/80 | HR 84 | Ht 65.0 in | Wt 120.0 lb

## 2017-04-18 DIAGNOSIS — R059 Cough, unspecified: Secondary | ICD-10-CM

## 2017-04-18 DIAGNOSIS — J441 Chronic obstructive pulmonary disease with (acute) exacerbation: Secondary | ICD-10-CM

## 2017-04-18 DIAGNOSIS — R05 Cough: Secondary | ICD-10-CM | POA: Diagnosis not present

## 2017-04-18 DIAGNOSIS — J449 Chronic obstructive pulmonary disease, unspecified: Secondary | ICD-10-CM | POA: Diagnosis not present

## 2017-04-18 DIAGNOSIS — R0602 Shortness of breath: Secondary | ICD-10-CM | POA: Diagnosis not present

## 2017-04-18 MED ORDER — DOXYCYCLINE HYCLATE 100 MG PO TABS: 100.0000 mg | ORAL_TABLET | Freq: Two times a day (BID) | ORAL | 0 refills | Status: DC

## 2017-04-18 MED ORDER — HYDROCOD POLST-CPM POLST ER 10-8 MG/5ML PO SUER: 5.0000 mL | Freq: Two times a day (BID) | ORAL | 0 refills | Status: DC | PRN

## 2017-04-18 NOTE — Patient Instructions (Signed)
For your COPD exacerbation: Finish the prednisone taper as prescribed by Dr. Lamonte Sakai Take doxycycline for 5 days Take Mucinex twice a day Drink plenty of fluids Keep using albuterol as needed for chest tightness, wheezing, or shortness of breath We will get a chest x-ray to make sure there is nothing else going on If you are not better in 3-4 days please let us know If you get worse over the weekend go the emergency room  For cough: Use tussionex as needed for cough, do not take this medicine with other sedating medicines and do not take this and drive  We'll see you back as previously arranged

## 2017-04-18 NOTE — Progress Notes (Signed)
Subjective:    Patient ID: Valerie Mosley, female    DOB: Feb 08, 1951, 66 y.o.   MRN: [id number]  Synopsis: Patient of Dr. Lamonte Sakai with COPD and a history of lung cancer s/p lobectomy   HPI  Chief Complaint  Patient presents with  . Acute Visit    RB pt being treated for COPD, lung CA presents with sore throat, HA, sinus congestion, prod cough with yellow mucus X1 week.    Valerie Mosley says she was feeling well when she saw Dr. Lamonte Sakai last week but three days later she started getting a headache, cough, choking sensation and dyspnea.  Dr. Lamonte Sakai called in prednisone which she has taken but she says the dyspnea is worse.  She said that yesterday she had a really severe cough with thick yellow mucus that would make her choke some.  She says that th will feel hot night.  No sick contacts.  She was outside a lot last weekend and her symptoms got worse.  She still has sinus congestion.   She says that the last flare up needed an antibiotic.    She doesn't smoke.  Uses spiriva.   Past Medical History:  Diagnosis Date  . Cancer ([place])    Stage IA non-small cell lung cancer, left upper lobectomy 12/2011  . COPD (chronic obstructive pulmonary disease) ([place])   . Cough 07/29/11   started coughing up blood this am  . Cough   . Hypertension   . Kyphoscoliosis   . PONV (postoperative nausea and vomiting)   . Recurrent upper respiratory infection (URI)        Review of Systems  Constitutional: Negative for chills, fatigue and fever.  HENT: Positive for rhinorrhea, sinus pain and sinus pressure.   Respiratory: Positive for cough, shortness of breath and wheezing.   Cardiovascular: Negative for chest pain, palpitations and leg swelling.       Objective:   Physical Exam Vitals:   04/18/17 1040  BP: 136/80  Pulse: 84  SpO2: 96%  Weight: 120 lb (54.4 kg)  Height: 5\' 5"  (1.651 m)  RA  Gen: chronically ill appearing HENT: OP clear, TM's clear, neck supple PULM: Poor air movement,  wheezing noted bilaterally, normal percussion CV: RRR, no mgr, trace edema GI: BS+, soft, nontender Derm: no cyanosis or rash Psyche: normal mood and affect  Records from her previous visit with Dr. Lamonte Sakai 1 week ago reviewed were she was seen for stable COPD.   CBC    Component Value Date/Time   WBC 5.3 12/24/2011 0355   RBC 4.11 12/24/2011 0355   HGB 12.4 12/24/2011 0355   HCT 37.4 12/24/2011 0355   PLT 308 12/24/2011 0355   MCV 91.0 12/24/2011 0355   MCH 30.2 12/24/2011 0355   MCHC 33.2 12/24/2011 0355   RDW 13.5 12/24/2011 0355   LYMPHSABS 3.4 07/29/2011 1549   MONOABS 0.7 07/29/2011 1549   EOSABS 0.2 07/29/2011 1549   BASOSABS 0.0 07/29/2011 1549          Assessment & Plan:  Chronic obstructive pulmonary disease, unspecified COPD type ([place])  COPD with acute exacerbation ([place])  Cough  Discussion: Valerie Mosley presents with a COPD exacerbation which is not resolving with prednisone. She still has wheezing on exam and ongoing mucus production. We need to get a chest x-ray to make sure there's nothing else going on because of her history of lung cancer. However, I think with the addition of an antibiotic she should improve.  Plan:  For your COPD exacerbation: Finish the prednisone taper as prescribed by Dr. Lamonte Sakai Take doxycycline for 5 days Take Mucinex twice a day Drink plenty of fluids Keep using albuterol as needed for chest tightness, wheezing, or shortness of breath We will get a chest x-ray to make sure there is nothing else going on If you are not better in 3-4 days please let us know If you get worse over the weekend go the emergency room  For cough: Use tussionex as needed for cough, do not take this medicine with other sedating medicines and do not take this and drive  We'll see you back as previously arranged    Current Outpatient Prescriptions:  .  acetaminophen (TYLENOL) 500 MG tablet, Take 500 mg by mouth every 6 (six) hours as needed. For pain  or fever, Disp: , Rfl:  .  albuterol (VENTOLIN HFA) 108 (90 Base) MCG/ACT inhaler, Inhale 2 puffs into the lungs every 4 (four) hours as needed for wheezing or shortness of breath. For shortness of breath, Disp: 8.51 g, Rfl: 5 .  amLODipine (NORVASC) 10 MG tablet, Take 10 mg by mouth daily.  , Disp: , Rfl:  .  b complex vitamins tablet, Take 1 tablet by mouth daily., Disp: , Rfl:  .  fluticasone (FLONASE) 50 MCG/ACT nasal spray, Place 2 sprays into both nostrils daily., Disp: 16 g, Rfl: 5 .  HYDROcodone-homatropine (HYCODAN) 5-1.5 MG/5ML syrup, Take 5 mLs by mouth every 4 (four) hours as needed for cough., Disp: 240 mL, Rfl: 0 .  loratadine (CLARITIN) 10 MG tablet, Take 10 mg by mouth daily. For allergies, Disp: , Rfl:  .  predniSONE (DELTASONE) 10 MG tablet, Take 40 mg x 3 days, 30 mg  X 3 days, 20 mg x 3 days, 10 mg x 3 days then stop., Disp: 30 tablet, Rfl: 0 .  sertraline (ZOLOFT) 25 MG tablet, Take 25 mg by mouth at bedtime., Disp: , Rfl:  .  tiotropium (SPIRIVA HANDIHALER) 18 MCG inhalation capsule, inhale the contents of one capsule in the handihaler once daily, Disp: 30 capsule, Rfl: 5

## 2017-05-05 ENCOUNTER — Other Ambulatory Visit: Payer: Self-pay | Admitting: Emergency Medicine

## 2017-05-21 DIAGNOSIS — M810 Age-related osteoporosis without current pathological fracture: Secondary | ICD-10-CM | POA: Diagnosis not present

## 2017-07-10 ENCOUNTER — Encounter: Payer: Self-pay | Admitting: Emergency Medicine

## 2017-07-10 ENCOUNTER — Ambulatory Visit (INDEPENDENT_AMBULATORY_CARE_PROVIDER_SITE_OTHER): Payer: Medicare Other | Admitting: Emergency Medicine

## 2017-07-10 DIAGNOSIS — J301 Allergic rhinitis due to pollen: Secondary | ICD-10-CM

## 2017-07-10 DIAGNOSIS — J449 Chronic obstructive pulmonary disease, unspecified: Secondary | ICD-10-CM

## 2017-07-10 DIAGNOSIS — Z23 Encounter for immunization: Secondary | ICD-10-CM | POA: Diagnosis not present

## 2017-07-10 DIAGNOSIS — C3492 Malignant neoplasm of unspecified part of left bronchus or lung: Secondary | ICD-10-CM

## 2017-07-10 DIAGNOSIS — Z720 Tobacco use: Secondary | ICD-10-CM | POA: Diagnosis not present

## 2017-07-10 NOTE — Patient Instructions (Signed)
Please continue Spiriva as you have been taking it We will continue fluticasone nasal spray, Singulair.  We will refill your Singulair at the pharmacy. Keep albuterol available to use 2 puffs as needed for shortness of breath Flu shot today Get your repeat CT scan of the chest spring 2019 as planned with Dr. Roxan Hockey. Follow with Dr Lamonte Sakai in 6 months or sooner if you have any problems

## 2017-07-10 NOTE — Assessment & Plan Note (Signed)
Please continue Spiriva as you have been taking it Keep albuterol available to use 2 puffs as needed for shortness of breath Flu shot today Follow with Dr Lamonte Sakai in 6 months or sooner if you have any problems

## 2017-07-10 NOTE — Assessment & Plan Note (Signed)
No longer smoking

## 2017-07-10 NOTE — Assessment & Plan Note (Signed)
No evidence of recurrence.  Repeat CT scan of the chest planned for spring 2019

## 2017-07-10 NOTE — Progress Notes (Signed)
  Subjective:    Patient ID: Valerie Mosley, female    DOB: 1951/04/16, 66 y.o.   MRN: [id number] HPI 66 yo woman, history of tobacco use and COPD, status post left upper lobe lobectomy in April 2013 for adenocarcinoma of the lung.  She no longer smokes.  She has been managed on Spiriva (her insurance would not cover Stiolto).  Serial CT scans have not shown any evidence for recurrence, last on 01/21/17.  Acute exacerbation in August for which she was treated with prednisone and antibiotics.  She improved and is back to baseline. She is working 5 days a week, remains active, is doing chores at home. She has some cough at night that wakes her up, not every night. Non-productive. She uses albuterol about . Remains of fluticasone NS. She started singulair at her OV last time with Dr [place] Bells and she feels that she is benefiting more than from loratadine. She is due for CT chest next Spring.    PULMONARY FUNCTON TEST 09/19/2011  Peak Flow 130  FVC 2.87  FEV1 1.94  FEV1/FVC 67.6  FVC  % Predicted 88  FEV % Predicted 81  FeF 25-75 1.04  FeF 25-75 % Predicted 2.65     Objective:   Vitals:   07/10/17 1053 07/10/17 1054  BP:  102/70  Pulse:  92  SpO2:  96%  Weight: 121 lb (54.9 kg)   Height: 5\' 4"  (1.626 m)    Gen: Pleasant, well-nourished, in no distress,  normal affect  ENT: No lesions,  mouth clear,  oropharynx clear, no postnasal drip  Neck: No JVD, no TMG, no carotid bruits  Lungs: No use of accessory muscles, coarse bilaterally, no wheeze  Cardiovascular: RRR, heart sounds normal, no murmur or gallops, no peripheral edema  Musculoskeletal: No deformities, no cyanosis or clubbing  Neuro: alert, non focal  Skin: Warm, no lesions or rashes     COPD (chronic obstructive pulmonary disease) Please continue Spiriva as you have been taking it Keep albuterol available to use 2 puffs as needed for shortness of breath Flu shot today Follow with Dr Lamonte Sakai in 6 months or sooner if you  have any problems   Non-small cell lung cancer No evidence of recurrence.  Repeat CT scan of the chest planned for spring 2019  Allergic rhinitis We will continue fluticasone nasal spray, Singulair.  We will refill your Singulair at the pharmacy.  Tobacco abuse No longer smoking  Baltazar Apo, MD, PhD 07/10/2017, 11:16 AM [facility] [phone] or if no answer [phone]

## 2017-07-10 NOTE — Assessment & Plan Note (Signed)
We will continue fluticasone nasal spray, Singulair.  We will refill your Singulair at the pharmacy.

## 2017-07-15 DIAGNOSIS — R197 Diarrhea, unspecified: Secondary | ICD-10-CM | POA: Diagnosis not present

## 2017-07-16 DIAGNOSIS — R197 Diarrhea, unspecified: Secondary | ICD-10-CM | POA: Diagnosis not present

## 2017-07-21 DIAGNOSIS — M810 Age-related osteoporosis without current pathological fracture: Secondary | ICD-10-CM | POA: Diagnosis not present

## 2017-09-01 DIAGNOSIS — K580 Irritable bowel syndrome with diarrhea: Secondary | ICD-10-CM | POA: Diagnosis not present

## 2017-09-10 ENCOUNTER — Other Ambulatory Visit: Payer: Self-pay

## 2017-09-10 MED ORDER — MONTELUKAST SODIUM 10 MG PO TABS: 10.0000 mg | ORAL_TABLET | Freq: Every day | ORAL | 5 refills | Status: AC

## 2017-09-23 DIAGNOSIS — E785 Hyperlipidemia, unspecified: Secondary | ICD-10-CM | POA: Diagnosis not present

## 2017-09-23 DIAGNOSIS — M810 Age-related osteoporosis without current pathological fracture: Secondary | ICD-10-CM | POA: Diagnosis not present

## 2017-09-23 DIAGNOSIS — E559 Vitamin D deficiency, unspecified: Secondary | ICD-10-CM | POA: Diagnosis not present

## 2017-09-23 DIAGNOSIS — J449 Chronic obstructive pulmonary disease, unspecified: Secondary | ICD-10-CM | POA: Diagnosis not present

## 2017-09-23 DIAGNOSIS — I10 Essential (primary) hypertension: Secondary | ICD-10-CM | POA: Diagnosis not present

## 2017-09-23 DIAGNOSIS — F325 Major depressive disorder, single episode, in full remission: Secondary | ICD-10-CM | POA: Diagnosis not present

## 2017-09-23 DIAGNOSIS — K580 Irritable bowel syndrome with diarrhea: Secondary | ICD-10-CM | POA: Diagnosis not present

## 2017-09-23 DIAGNOSIS — Z87891 Personal history of nicotine dependence: Secondary | ICD-10-CM | POA: Diagnosis not present

## 2017-10-28 ENCOUNTER — Telehealth: Payer: Self-pay | Admitting: Emergency Medicine

## 2017-10-28 NOTE — Telephone Encounter (Signed)
Spoke with pt, she states she noticed she had a fever blister this morning when she woke up. She would like Dr. Lamonte Sakai to send in Acyclovir ointment to her pharmacy. She states he has done this in the past for her. RB please advise.    [facility]

## 2017-10-29 MED ORDER — ACYCLOVIR 5 % EX OINT: 1.0000 "application " | TOPICAL_OINTMENT | CUTANEOUS | 0 refills | Status: DC

## 2017-10-29 NOTE — Telephone Encounter (Signed)
Yes this is ok 

## 2017-10-30 ENCOUNTER — Telehealth: Payer: Self-pay | Admitting: *Deleted

## 2017-10-30 ENCOUNTER — Encounter: Payer: Self-pay | Admitting: *Deleted

## 2017-10-30 DIAGNOSIS — B001 Herpesviral vesicular dermatitis: Secondary | ICD-10-CM | POA: Insufficient documentation

## 2017-10-30 NOTE — Telephone Encounter (Signed)
PA initiated via [email or web address] for the acyclovir  Key [id number] Feb 09, 1951  Will take up to 72 hours for response.  Will forward to LCL to follow up on.

## 2017-10-31 NOTE — Telephone Encounter (Signed)
PA for Acyclovir has been denied on Covermymeds.  Called pt's pharmacy and spoke to Legrand Como.  I asked how much med would be if pt paid for it out of pocket and was told it would be $57.49   Cheapest OTC med to help with fever blister would be Abreva  Valtrex would be a cheaper prescription med to help get rid of the fever blister.  Called pt stating this information to her and she stated to me the fever blister was almost gone so she did not need a med anymore.  While speaking with pt, scheduled pt's f/u appt with Dr. Lamonte Sakai.  Nothing further needed at this current time.

## 2017-12-26 ENCOUNTER — Ambulatory Visit: Payer: Medicare Other | Admitting: Emergency Medicine

## 2018-01-07 ENCOUNTER — Other Ambulatory Visit: Payer: Self-pay | Admitting: Thoracic Surgery (Cardiothoracic Vascular Surgery)

## 2018-01-07 DIAGNOSIS — R911 Solitary pulmonary nodule: Secondary | ICD-10-CM

## 2018-01-07 DIAGNOSIS — C3490 Malignant neoplasm of unspecified part of unspecified bronchus or lung: Secondary | ICD-10-CM

## 2018-01-12 DIAGNOSIS — H43813 Vitreous degeneration, bilateral: Secondary | ICD-10-CM | POA: Diagnosis not present

## 2018-01-12 DIAGNOSIS — H5213 Myopia, bilateral: Secondary | ICD-10-CM | POA: Diagnosis not present

## 2018-01-12 DIAGNOSIS — H26493 Other secondary cataract, bilateral: Secondary | ICD-10-CM | POA: Diagnosis not present

## 2018-01-12 DIAGNOSIS — Z961 Presence of intraocular lens: Secondary | ICD-10-CM | POA: Diagnosis not present

## 2018-01-13 ENCOUNTER — Encounter: Payer: Self-pay | Admitting: Emergency Medicine

## 2018-01-13 ENCOUNTER — Ambulatory Visit (INDEPENDENT_AMBULATORY_CARE_PROVIDER_SITE_OTHER): Payer: Medicare Other | Admitting: Emergency Medicine

## 2018-01-13 DIAGNOSIS — J449 Chronic obstructive pulmonary disease, unspecified: Secondary | ICD-10-CM | POA: Diagnosis not present

## 2018-01-13 DIAGNOSIS — J301 Allergic rhinitis due to pollen: Secondary | ICD-10-CM

## 2018-01-13 DIAGNOSIS — C3490 Malignant neoplasm of unspecified part of unspecified bronchus or lung: Secondary | ICD-10-CM | POA: Diagnosis not present

## 2018-01-13 NOTE — Assessment & Plan Note (Signed)
Get your CT chest and follow with Dr Roxan Hockey as planned.

## 2018-01-13 NOTE — Assessment & Plan Note (Signed)
Given her functional capacity and her overall stability I do not think there is any indication to change her Spiriva at this time.  If she progresses then we will likely switch to a LABA/LAMA  Please continue your Spiriva and albuterol as you have been using them  Flu shot in the Fall Follow with Dr Lamonte Sakai in 6 months or sooner if you have any problems

## 2018-01-13 NOTE — Progress Notes (Signed)
  Subjective:    Patient ID: DARCUS EDDS, female    DOB: [birth year], 67 y.o.   MRN: [id number] HPI 67 yo woman, history of tobacco use and COPD, status post left upper lobe lobectomy in April 2013 for adenocarcinoma of the lung.  She no longer smokes.  She has been managed on Spiriva (her insurance would not cover Stiolto).  Serial CT scans have not shown any evidence for recurrence, last on 01/21/17.  Acute exacerbation in August for which she was treated with prednisone and antibiotics.  She improved and is back to baseline. She is working 5 days a week, remains active, is doing chores at home. She has some cough at night that wakes her up, not every night. Non-productive. She uses albuterol about . Remains of fluticasone NS. She started singulair at her OV last time with Dr [place] Bells and she feels that she is benefiting more than from loratadine. She is due for CT chest next Spring.   ROV 01/13/18 --follow-up visit today for 67 year old woman with a history of COPD and adenocarcinoma of the lung status post left upper lobe lobectomy in April 2013. She has had some trouble with increased cough during the pollen season, mostly at night. She took her loratadine daily during this time, usually takes prn. Also uses flonase prn. She is also on singulair every day. She remains on spiriva. Uses albuterol about 2-3x a week. She is still working. No flares since last time. Her next CT chest is 02/10/18 w follow up w Dr Roxan Hockey.    PULMONARY FUNCTON TEST 09/19/2011  Peak Flow 130  FVC 2.87  FEV1 1.94  FEV1/FVC 67.6  FVC  % Predicted 88  FEV % Predicted 81  FeF 25-75 1.04  FeF 25-75 % Predicted 2.65     Objective:   Vitals:   01/13/18 1104  BP: 102/72  Pulse: 85  SpO2: 96%  Weight: 123 lb (55.8 kg)   Gen: Pleasant, well-nourished, in no distress,  normal affect  ENT: No lesions,  mouth clear,  oropharynx clear, no postnasal drip, mild congestion  Neck: No JVD, no stridor  Lungs: No use of  accessory muscles, coarse bilaterally, no wheeze  Cardiovascular: RRR, heart sounds normal, no murmur or gallops, no peripheral edema  Musculoskeletal: No deformities, no cyanosis or clubbing  Neuro: alert, non focal  Skin: Warm, no lesions or rashes     Allergic rhinitis Continue Singulair every evening. You can add loratadine 10 mg daily, fluticasone nasal spray 2 sprays each nostril once a day, depending on the activity of your allergies, congestion, cough   Non-small cell lung cancer Get your CT chest and follow with Dr Roxan Hockey as planned.   COPD (chronic obstructive pulmonary disease) Given her functional capacity and her overall stability I do not think there is any indication to change her Spiriva at this time.  If she progresses then we will likely switch to a LABA/LAMA  Please continue your Spiriva and albuterol as you have been using them  Flu shot in the Fall Follow with Dr Lamonte Sakai in 6 months or sooner if you have any problems  Baltazar Apo, MD, PhD 01/13/2018, 11:28 AM [facility] [phone] or if no answer [phone]

## 2018-01-13 NOTE — Patient Instructions (Signed)
Please continue your Spiriva and albuterol as you have been using them  Continue Singulair every evening. You can add loratadine 10 mg daily, fluticasone nasal spray 2 sprays each nostril once a day, depending on the activity of your allergies, congestion, cough  Get your CT chest and follow with Dr Roxan Hockey as planned.  Flu shot in the Fall Follow with Dr Lamonte Sakai in 6 months or sooner if you have any problems

## 2018-01-13 NOTE — Assessment & Plan Note (Signed)
Continue Singulair every evening. You can add loratadine 10 mg daily, fluticasone nasal spray 2 sprays each nostril once a day, depending on the activity of your allergies, congestion, cough

## 2018-02-10 ENCOUNTER — Ambulatory Visit (INDEPENDENT_AMBULATORY_CARE_PROVIDER_SITE_OTHER): Payer: Medicare Other | Admitting: Thoracic Surgery (Cardiothoracic Vascular Surgery)

## 2018-02-10 ENCOUNTER — Other Ambulatory Visit: Payer: Self-pay

## 2018-02-10 ENCOUNTER — Ambulatory Visit
Admission: RE | Admit: 2018-02-10 | Discharge: 2018-02-10 | Disposition: A | Discharge status: Home / Self Care | Payer: 59 | Source: Ambulatory Visit | Attending: Thoracic Surgery (Cardiothoracic Vascular Surgery) | Admitting: Thoracic Surgery (Cardiothoracic Vascular Surgery)

## 2018-02-10 ENCOUNTER — Encounter: Payer: Self-pay | Admitting: Thoracic Surgery (Cardiothoracic Vascular Surgery)

## 2018-02-10 ENCOUNTER — Other Ambulatory Visit: Payer: Self-pay | Admitting: *Deleted

## 2018-02-10 VITALS — BP 100/70 | HR 76 | Resp 16 | Ht 64.0 in | Wt 123.4 lb

## 2018-02-10 DIAGNOSIS — Z85118 Personal history of other malignant neoplasm of bronchus and lung: Secondary | ICD-10-CM

## 2018-02-10 DIAGNOSIS — R911 Solitary pulmonary nodule: Secondary | ICD-10-CM

## 2018-02-10 DIAGNOSIS — R918 Other nonspecific abnormal finding of lung field: Secondary | ICD-10-CM | POA: Diagnosis not present

## 2018-02-10 NOTE — Progress Notes (Signed)
[address]       [place] [address]             [phone]     HPI: Valerie Mosley returns for a scheduled follow-up visit  Valerie Mosley is a 67 year old woman with a past history of tobacco abuse and stage Ia non-small cell carcinoma of the left upper lobe.  She had a thoracoscopic left upper lobectomy in 2013.  I have been following her since then.  She has had multiple lung nodules that have been stable over time on her scans.  Since her last visit she has been feeling well.  She denies any chest pain, pressure or tightness.  She says that she has good days and bad days with regard to her wheezing.  She recently saw Dr. Lamonte Mosley.  Her appetite is good and her weight has been stable.  Past Medical History:  Diagnosis Date  . Cancer ([place])    Stage IA non-small cell lung cancer, left upper lobectomy 12/2011  . COPD (chronic obstructive pulmonary disease) ([place])   . Cough 07/29/11   started coughing up blood this am  . Cough   . Hypertension   . Kyphoscoliosis   . PONV (postoperative nausea and vomiting)   . Recurrent upper respiratory infection (URI)      Current Outpatient Medications  Medication Sig Dispense Refill  . acetaminophen (TYLENOL) 500 MG tablet Take 500 mg by mouth every 6 (six) hours as needed. For pain or fever    . acyclovir ointment (ZOVIRAX) 5 % Apply 1 application topically every 3 (three) hours. 15 g 0  . albuterol (VENTOLIN HFA) 108 (90 Base) MCG/ACT inhaler Inhale 2 puffs into the lungs every 4 (four) hours as needed for wheezing or shortness of breath. For shortness of breath 8.51 g 5  . alendronate (FOSAMAX) 70 MG tablet Take 70 mg by mouth once a week. Take with a full glass of water on an empty stomach.    Marland Kitchen amLODipine (NORVASC) 10 MG tablet Take 10 mg by mouth daily.      Marland Kitchen b complex vitamins tablet Take 1 tablet by mouth daily.    . fluticasone (FLONASE) 50 MCG/ACT nasal spray instill 2 sprays into each nostril once daily 16 g 5   . montelukast (SINGULAIR) 10 MG tablet Take 1 tablet (10 mg total) by mouth at bedtime. 30 tablet 5  . sertraline (ZOLOFT) 25 MG tablet Take 25 mg by mouth at bedtime.    Marland Kitchen tiotropium (SPIRIVA HANDIHALER) 18 MCG inhalation capsule inhale the contents of one capsule in the handihaler once daily 30 capsule 5  . Vitamin D, Ergocalciferol, (DRISDOL) 50000 units CAPS capsule Take 50,000 Units by mouth every 7 (seven) days.     No current facility-administered medications for this visit.     Physical Exam BP 100/70 (BP Location: Right Arm, Patient Position: Sitting, Cuff Size: Normal)   Pulse 76   Resp 16   Ht 5\' 4"  (1.626 m)   Wt 123 lb 6.4 oz (56 kg)   SpO2 96% Comment: ON RA  BMI 21.44 kg/m  67 year old woman in no acute distress Alert and oriented x3 with no focal deficits Lungs diminished bilaterally, no wheezing No cervical or supraclavicular adenopathy Cardiac regular rate and rhythm normal S1-S2  Diagnostic Tests: CT CHEST WITHOUT CONTRAST  TECHNIQUE: Multidetector CT imaging of the chest was performed following the standard protocol without IV contrast.  COMPARISON:  01/21/2017 chest CT.  FINDINGS: Cardiovascular: Normal heart size. No significant pericardial effusion/thickening. Three-vessel coronary atherosclerosis. Atherosclerotic nonaneurysmal thoracic aorta. Normal caliber pulmonary arteries.  Mediastinum/Nodes: Status post left hemithyroidectomy. Stable mildly enlarged heterogeneous right thyroid lobe with dominant medial 1.2 cm right thyroid lobe nodule. Unremarkable esophagus. No axillary adenopathy. No pathologically enlarged mediastinal or discrete hilar nodes on this noncontrast scan. Surgical clips are noted in the left axilla.  Lungs/Pleura: No pneumothorax. No pleural effusion. Status post left upper lobectomy. Moderate to severe centrilobular and paraseptal emphysema with diffuse bronchial wall thickening. Superior segment left lower lobe  irregular pleural-based solid 1.9 x 1.2 cm pulmonary nodule (series 8/image 6), increased from 1.4 x 1.0 cm on 01/21/2017 chest CT and 0.7 x 0.5 cm on 01/09/2016 chest CT. Irregular peripheral basilar left lower lobe 1.1 cm nodular opacity (series 8/image 95), stable back to at least 01/09/2016 chest CT, considered benign. Scattered right upper lobe solid pulmonary nodules, largest 7 mm (series 8/image 55), all stable since at least 01/09/2016, considered benign. No acute consolidative airspace disease or additional significant pulmonary nodules.  Upper abdomen: Numerous simple scattered liver cysts, largest 3.1 cm. Additional subcentimeter hypodense lesions scattered throughout the visualized liver, too small to characterize, not appreciably changed, probably benign. Stable irregular left adrenal gland without discrete adrenal nodules. Stable scarring in the upper left kidney with nonobstructing 6 mm interpolar left renal stone.  Musculoskeletal: No aggressive appearing focal osseous lesions. Moderate thoracic spondylosis.  IMPRESSION: 1. Enlarging irregular pleural-based solid 1.9 cm pulmonary nodule in the superior segment left lower lobe, suspicious for malignancy, either recurrent disease or a metachronous primary bronchogenic carcinoma. Consider PET-CT for further characterization. 2. Additional scattered bilateral pulmonary nodules are stable and considered benign. 3. No noncontrast CT evidence of thoracic adenopathy. 4. Three-vessel coronary atherosclerosis. 5. Nonobstructing left nephrolithiasis.  Aortic Atherosclerosis (ICD10-I70.0) and Emphysema (ICD10-J43.9).  These results will be called to the ordering clinician or representative by the Radiologist Assistant, and communication documented in the PACS or zVision Dashboard.   Electronically Signed   By: Ilona Sorrel M.D.   On: 02/10/2018 10:53  I personally reviewed the CT images and concur with the  findings noted above  Impression: Valerie Mosley is a 67 year old woman with a history of a stage IA non-small cell carcinoma treated with a left upper lobectomy 2013.  She had no evidence of recurrent disease at 5 years, but will continue to follow due to her smoking history and the presence of multiple lung nodules.  On today's scan there is a an increase in size of an irregular nodule in the superior segment of her left lower lobe.  This could be scarring, but also could represent a new primary bronchogenic carcinoma.  I recommended a PET CT to further evaluate and help guide our initial diagnostic work-up.  Tobacco abuse-smoking in 2013  COPD-stable on current regimen.  Managed by Dr. Lamonte Mosley.  Plan: PET/CT to guide initial diagnostic work-up Return after PET/CT to discuss further  [name], MD [facility] [phone]

## 2018-02-11 ENCOUNTER — Other Ambulatory Visit: Payer: Medicare Other

## 2018-02-16 ENCOUNTER — Other Ambulatory Visit: Payer: Self-pay | Admitting: *Deleted

## 2018-02-18 ENCOUNTER — Encounter (HOSPITAL_COMMUNITY)
Admission: RE | Admit: 2018-02-18 | Discharge: 2018-02-18 | Disposition: A | Discharge status: Home / Self Care | Payer: Medicare Other | Source: Ambulatory Visit | Attending: Thoracic Surgery (Cardiothoracic Vascular Surgery) | Admitting: Thoracic Surgery (Cardiothoracic Vascular Surgery)

## 2018-02-18 DIAGNOSIS — R911 Solitary pulmonary nodule: Secondary | ICD-10-CM | POA: Insufficient documentation

## 2018-02-18 LAB — GLUCOSE, CAPILLARY: Glucose-Capillary: 110 mg/dL — ABNORMAL HIGH (ref 65–99)

## 2018-02-18 MED ORDER — FLUDEOXYGLUCOSE F - 18 (FDG) INJECTION: 6.1000 | Freq: Once | INTRAVENOUS | Status: DC | PRN

## 2018-02-24 ENCOUNTER — Ambulatory Visit (INDEPENDENT_AMBULATORY_CARE_PROVIDER_SITE_OTHER): Payer: Medicare Other | Admitting: Thoracic Surgery (Cardiothoracic Vascular Surgery)

## 2018-02-24 ENCOUNTER — Other Ambulatory Visit: Payer: Self-pay | Admitting: *Deleted

## 2018-02-24 VITALS — BP 115/82 | HR 84 | Resp 20 | Ht 64.0 in | Wt 124.0 lb

## 2018-02-24 DIAGNOSIS — R918 Other nonspecific abnormal finding of lung field: Secondary | ICD-10-CM

## 2018-02-24 DIAGNOSIS — R911 Solitary pulmonary nodule: Secondary | ICD-10-CM

## 2018-02-24 DIAGNOSIS — Z902 Acquired absence of lung [part of]: Secondary | ICD-10-CM

## 2018-02-24 DIAGNOSIS — Z85118 Personal history of other malignant neoplasm of bronchus and lung: Secondary | ICD-10-CM | POA: Diagnosis not present

## 2018-02-24 NOTE — Progress Notes (Signed)
[address]       [place] [address]             [phone]     HPI: Ms. Valerie Mosley returns for follow-up after her PET/CT.  Valerie Mosley is a 67 year old woman with a past history of tobacco abuse, COPD, and a thoracoscopic left upper lobectomy for stage IA non-small cell carcinoma in 2013.  She has been followed since that time.  She has had multiple small lung nodules that have been stable.  I saw her in the office on 02/10/2018.  She had an annual chest CT, showed an increase in the size of a pleural-based nodule in the left apex which is in the superior segment of her lower lobe.  This was previously thought to be scarring.  I recommended a PET CT to further characterize.  She has been feeling well since her last visit.  Past Medical History:  Diagnosis Date  . Cancer ([place])    Stage IA non-small cell lung cancer, left upper lobectomy 12/2011  . COPD (chronic obstructive pulmonary disease) ([place])   . Cough 07/29/11   started coughing up blood this am  . Cough   . Hypertension   . Kyphoscoliosis   . PONV (postoperative nausea and vomiting)   . Recurrent upper respiratory infection (URI)     Current Outpatient Medications  Medication Sig Dispense Refill  . acetaminophen (TYLENOL) 500 MG tablet Take 500 mg by mouth every 6 (six) hours as needed. For pain or fever    . acyclovir ointment (ZOVIRAX) 5 % Apply 1 application topically every 3 (three) hours. 15 g 0  . albuterol (VENTOLIN HFA) 108 (90 Base) MCG/ACT inhaler Inhale 2 puffs into the lungs every 4 (four) hours as needed for wheezing or shortness of breath. For shortness of breath 8.51 g 5  . alendronate (FOSAMAX) 70 MG tablet Take 70 mg by mouth once a week. Take with a full glass of water on an empty stomach.    Marland Kitchen amLODipine (NORVASC) 10 MG tablet Take 10 mg by mouth daily.      Marland Kitchen b complex vitamins tablet Take 1 tablet by mouth daily.    . fluticasone (FLONASE) 50 MCG/ACT nasal spray instill 2 sprays  into each nostril once daily 16 g 5  . montelukast (SINGULAIR) 10 MG tablet Take 1 tablet (10 mg total) by mouth at bedtime. 30 tablet 5  . sertraline (ZOLOFT) 25 MG tablet Take 25 mg by mouth at bedtime.    Marland Kitchen tiotropium (SPIRIVA HANDIHALER) 18 MCG inhalation capsule inhale the contents of one capsule in the handihaler once daily 30 capsule 5  . Vitamin D, Ergocalciferol, (DRISDOL) 50000 units CAPS capsule Take 50,000 Units by mouth every 7 (seven) days.     No current facility-administered medications for this visit.     Physical Exam BP 115/82   Pulse 84   Resp 20   Ht 5\' 4"  (1.626 m)   Wt 124 lb (56.2 kg)   BMI 21.32 kg/m  67 year old woman in no acute distress Alert and oriented x3 with no focal deficits Cardiac regular rate and rhythm Lungs with faint wheezing bilaterally  Diagnostic Tests: NUCLEAR MEDICINE PET SKULL BASE TO THIGH  TECHNIQUE: 6.1 mCi F-18 FDG was injected intravenously. Full-ring PET imaging was performed from the skull base to thigh after the radiotracer. CT data was obtained and used for attenuation correction and anatomic localization.  Fasting blood glucose: 110 mg/dl  COMPARISON:  02/10/2018, 01/21/2017 and 01/09/2016.  FINDINGS: Mediastinal blood pool activity: SUV max 2.3  NECK: No hypermetabolic lymph nodes.  Incidental CT findings: None.  CHEST: No hypermetabolic mediastinal, hilar or axillary lymph nodes. Apical nodule in the superior segment left lower lobe measures 1.1 x 1.9 cm with an SUV max of 4.2. No additional hypermetabolic pulmonary nodules.  Incidental CT findings: Atherosclerotic calcification of the arterial vasculature, including coronary arteries. No pericardial or pleural effusion. Moderate centrilobular and paraseptal emphysema.  ABDOMEN/PELVIS: No abnormal hypermetabolism in the liver, adrenal glands, spleen or pancreas. No hypermetabolic lymph nodes.  Incidental CT findings: No acute findings. A stone is  seen in the left kidney.  SKELETON: No abnormal osseous hypermetabolism.  Incidental CT findings: Degenerative changes in the spine.  IMPRESSION: 1. Hypermetabolic apical nodule in the superior segment left lower lobe is highly worrisome for bronchogenic carcinoma. 2. No additional areas of abnormal hypermetabolism in the neck, chest, abdomen or pelvis. 3. Aortic atherosclerosis (ICD10-170.0). Coronary artery calcification. 4.  Emphysema (ICD10-J43.9). 5. Left renal stone.   Electronically Signed   By: Lorin Picket M.D.   On: 02/18/2018 15:40 I personally reviewed the PET/CT images and concur with the findings noted above  Impression: Valerie Mosley is a 67 year old woman with a 40-pack-year history of smoking prior to quitting in April 2013.  She had a thoracoscopic left upper lobectomy for a stage IA non-small cell carcinoma.  She was followed for 5 years with no evidence of recurrent disease.  Recently a CT of the chest showed a nodule increasing in size in the left apex.  This is about 1.9 cm in maximal dimension.  PET/CT shows this nodule is hypermetabolic.  It is highly suspicious for a new primary bronchogenic carcinoma.  There is no evidence of regional or distant metastases.  Infectious and inflammatory nodules are also in the differential diagnosis.  We briefly discussed potential diagnostic and treatment options for this nodule.  I think given that it would be redo surgery we should go ahead and do a CT-guided biopsy regardless of whether she ultimately decides to proceed with surgery or radiation.  I think either would be a reasonable option.  From a surgical standpoint would be looking at a wedge resection or possibly a segmentectomy if the anatomy allows that.  We discussed the relative advantages and disadvantages of surgery versus stereotactic radiation.  If the CT-guided biopsy were to show granulomatous disease we could potentially avoid a redo operation.  If positive  for non-small cell carcinoma we can discuss whether she would prefer to have surgery or talk to radiation oncology.  Plan: CT-guided needle biopsy.  Return to office to discuss results after biopsy done  [name], MD [facility] [phone]

## 2018-03-04 ENCOUNTER — Other Ambulatory Visit: Payer: Self-pay | Admitting: Radiology

## 2018-03-05 ENCOUNTER — Other Ambulatory Visit: Payer: Self-pay | Admitting: Student

## 2018-03-05 ENCOUNTER — Ambulatory Visit (HOSPITAL_COMMUNITY): Payer: Medicare Other

## 2018-03-06 ENCOUNTER — Ambulatory Visit (HOSPITAL_COMMUNITY)
Admission: RE | Admit: 2018-03-06 | Discharge: 2018-03-06 | Disposition: A | Discharge status: Home / Self Care | Payer: Medicare Other | Source: Ambulatory Visit | Attending: Thoracic Surgery (Cardiothoracic Vascular Surgery) | Admitting: Thoracic Surgery (Cardiothoracic Vascular Surgery)

## 2018-03-06 ENCOUNTER — Encounter (HOSPITAL_COMMUNITY): Payer: Self-pay

## 2018-03-06 ENCOUNTER — Ambulatory Visit (HOSPITAL_COMMUNITY)
Admission: RE | Admit: 2018-03-06 | Discharge: 2018-03-06 | Disposition: A | Discharge status: Home / Self Care | Payer: Medicare Other | Source: Ambulatory Visit | Attending: Interventional Radiology | Admitting: Interventional Radiology

## 2018-03-06 DIAGNOSIS — E041 Nontoxic single thyroid nodule: Secondary | ICD-10-CM | POA: Diagnosis not present

## 2018-03-06 DIAGNOSIS — C7802 Secondary malignant neoplasm of left lung: Secondary | ICD-10-CM | POA: Diagnosis not present

## 2018-03-06 DIAGNOSIS — Z85118 Personal history of other malignant neoplasm of bronchus and lung: Secondary | ICD-10-CM | POA: Diagnosis not present

## 2018-03-06 DIAGNOSIS — J439 Emphysema, unspecified: Secondary | ICD-10-CM | POA: Diagnosis not present

## 2018-03-06 DIAGNOSIS — J449 Chronic obstructive pulmonary disease, unspecified: Secondary | ICD-10-CM | POA: Insufficient documentation

## 2018-03-06 DIAGNOSIS — Z79899 Other long term (current) drug therapy: Secondary | ICD-10-CM | POA: Diagnosis not present

## 2018-03-06 DIAGNOSIS — I10 Essential (primary) hypertension: Secondary | ICD-10-CM | POA: Insufficient documentation

## 2018-03-06 DIAGNOSIS — R911 Solitary pulmonary nodule: Secondary | ICD-10-CM | POA: Diagnosis not present

## 2018-03-06 DIAGNOSIS — N200 Calculus of kidney: Secondary | ICD-10-CM | POA: Diagnosis not present

## 2018-03-06 DIAGNOSIS — Z9889 Other specified postprocedural states: Secondary | ICD-10-CM

## 2018-03-06 DIAGNOSIS — C3432 Malignant neoplasm of lower lobe, left bronchus or lung: Secondary | ICD-10-CM | POA: Diagnosis not present

## 2018-03-06 DIAGNOSIS — Z87891 Personal history of nicotine dependence: Secondary | ICD-10-CM | POA: Insufficient documentation

## 2018-03-06 DIAGNOSIS — I700 Atherosclerosis of aorta: Secondary | ICD-10-CM | POA: Insufficient documentation

## 2018-03-06 DIAGNOSIS — Z902 Acquired absence of lung [part of]: Secondary | ICD-10-CM | POA: Diagnosis not present

## 2018-03-06 DIAGNOSIS — J984 Other disorders of lung: Secondary | ICD-10-CM | POA: Diagnosis not present

## 2018-03-06 LAB — CBC
HCT: 48.6 % — ABNORMAL HIGH (ref 36.0–46.0)
Hemoglobin: 16.1 g/dL — ABNORMAL HIGH (ref 12.0–15.0)
MCH: 30.7 pg (ref 26.0–34.0)
MCHC: 33.1 g/dL (ref 30.0–36.0)
MCV: 92.7 fL (ref 78.0–100.0)
Platelets: 312 10*3/uL (ref 150–400)
RBC: 5.24 MIL/uL — ABNORMAL HIGH (ref 3.87–5.11)
RDW: 13.6 % (ref 11.5–15.5)
WBC: 8.5 10*3/uL (ref 4.0–10.5)

## 2018-03-06 LAB — PROTIME-INR
INR: 1.03
Prothrombin Time: 13.4 seconds (ref 11.4–15.2)

## 2018-03-06 MED ORDER — LIDOCAINE HCL 1 % IJ SOLN
INTRAMUSCULAR | Status: AC
  Filled 2018-03-06: qty 20

## 2018-03-06 MED ORDER — SODIUM CHLORIDE 0.9 % IV SOLN: INTRAVENOUS | Status: DC

## 2018-03-06 MED ORDER — FENTANYL CITRATE (PF) 100 MCG/2ML IJ SOLN
INTRAMUSCULAR | Status: AC
  Filled 2018-03-06: qty 2

## 2018-03-06 MED ORDER — FENTANYL CITRATE (PF) 100 MCG/2ML IJ SOLN
INTRAMUSCULAR | Status: AC | PRN
  Administered 2018-03-06: 25 ug via INTRAVENOUS

## 2018-03-06 MED ORDER — MIDAZOLAM HCL 2 MG/2ML IJ SOLN
INTRAMUSCULAR | Status: AC | PRN
  Administered 2018-03-06: 0.5 mg via INTRAVENOUS

## 2018-03-06 MED ORDER — MIDAZOLAM HCL 2 MG/2ML IJ SOLN
INTRAMUSCULAR | Status: AC
  Filled 2018-03-06: qty 2

## 2018-03-06 NOTE — Discharge Instructions (Addendum)
Needle Biopsy of the Lung, Care After °This sheet gives you information about how to care for yourself after your procedure. Your health care provider may also give you more specific instructions. If you have problems or questions, contact your health care provider. °What can I expect after the procedure? °After the procedure, it is common to have: °· Soreness, pain, and tenderness where a tissue sample was taken (biopsy site). °· A cough. °· A sore throat. ° °Follow these instructions at home: °Biopsy site care °· Follow instructions from your health care provider about when to remove the bandage that was placed on the biopsy site. °· Keep the bandage dry until it has been removed. °· Check your biopsy site every day for signs of infection. Check for: °? More redness, swelling, or pain. °? More fluid or blood. °? Warmth to the touch. °? Pus or a bad smell. °General instructions °· Rest as directed by your health care provider. Ask your health care provider what activities are safe for you. °· Do not take baths, swim, or use a hot tub until your health care provider approves. °· Take over-the-counter and prescription medicines only as told by your health care provider. °· If you have airplane travel scheduled, talk with your health care provider about when it is safe for you to travel by airplane. °· It is up to you to get the results of your procedure. Ask your health care provider, or the department that is doing the procedure, when your results will be ready. °· Keep all follow-up visits as told by your health care provider. This is important. °Contact a health care provider if: °· You have more redness, swelling, or pain around your biopsy site. °· You have more fluid or blood coming from your biopsy site. °· Your biopsy site feels warm to the touch. °· You have pus or a bad smell coming from your biopsy site. °· You have a fever. °· You have pain that does not get better with medicine. °Get help right away  if: °· You have problems breathing. °· You have chest pain. °· You cough up blood. °· You faint. °· You have a fast heart rate. °Summary °· After a needle biopsy of the lung, it is common to have a cough, a sore throat, or soreness, pain, and tenderness where a tissue sample was taken (biopsy site). °· You should check your biopsy area every day for signs of infection, including pus or a bad smell, warmth, more fluid or blood, or more redness, swelling, or pain. °· You should not take baths, swim, or use a hot tub until your health care provider approves. °· It is up to you to get the results of your procedure. Ask your health care provider, or the department that is doing the procedure, when your results will be ready. °This information is not intended to replace advice given to you by your health care provider. Make sure you discuss any questions you have with your health care provider. °Document Released: 06/30/2007 Document Revised: 07/24/2016 Document Reviewed: 07/24/2016 °Elsevier Interactive Patient Education © 2017 [employer] ° °Moderate Conscious Sedation, Adult, Care After °These instructions provide you with information about caring for yourself after your procedure. Your health care provider may also give you more specific instructions. Your treatment has been planned according to current medical practices, but problems sometimes occur. Call your health care provider if you have any problems or questions after your procedure. °What can I expect after the   procedure? °After your procedure, it is common: °· To feel sleepy for several hours. °· To feel clumsy and have poor balance for several hours. °· To have poor judgment for several hours. °· To vomit if you eat too soon. ° °Follow these instructions at home: °For at least 24 hours after the procedure: ° °· Do not: °? Participate in activities where you could fall or become injured. °? Drive. °? Use heavy machinery. °? Drink alcohol. °? Take sleeping  pills or medicines that cause drowsiness. °? Make important decisions or sign legal documents. °? Take care of children on your own. °· Rest. °Eating and drinking °· Follow the diet recommended by your health care provider. °· If you vomit: °? Drink water, juice, or soup when you can drink without vomiting. °? Make sure you have little or no nausea before eating solid foods. °General instructions °· Have a responsible adult stay with you until you are awake and alert. °· Take over-the-counter and prescription medicines only as told by your health care provider. °· If you smoke, do not smoke without supervision. °· Keep all follow-up visits as told by your health care provider. This is important. °Contact a health care provider if: °· You keep feeling nauseous or you keep vomiting. °· You feel light-headed. °· You develop a rash. °· You have a fever. °Get help right away if: °· You have trouble breathing. °This information is not intended to replace advice given to you by your health care provider. Make sure you discuss any questions you have with your health care provider. °Document Released: 06/23/2013 Document Revised: 02/05/2016 Document Reviewed: 12/23/2015 °Elsevier Interactive Patient Education © 2018 Elsevier Inc. ° °

## 2018-03-06 NOTE — H&P (Signed)
Chief Complaint: Patient was seen in consultation today for left lung mass biospy at the request of Hendrickson,Steven C  Referring Physician(s): Fulton C  Supervising Physician: Sandi Mariscal  Patient Status: [facility] - Out-pt  History of Present Illness: Valerie Mosley is a 67 y.o. female   Non small cell lung cancer 2013 Left upper lobectomy 2013 Follows with Dr Roxan Hockey She had an annual chest CT, showed an increase in the size of a pleural-based nodule in the left apex which is in the superior segment of her lower lobe.  This was previously thought to be scarring.  I recommended a PET CT to further characterize.  PET 02/18/18:  IMPRESSION: 1. Hypermetabolic apical nodule in the superior segment left lower lobe is highly worrisome for bronchogenic carcinoma. 2. No additional areas of abnormal hypermetabolism in the neck, chest, abdomen or pelvis. 3. Aortic atherosclerosis (ICD10-170.0). Coronary artery calcification. 4.  Emphysema (ICD10-J43.9). 5. Left renal stone  Now scheduled for left lung nodule biopsy   Past Medical History:  Diagnosis Date  . Cancer ([place])    Stage IA non-small cell lung cancer, left upper lobectomy 12/2011  . COPD (chronic obstructive pulmonary disease) ([place])   . Cough 07/29/11   started coughing up blood this am  . Cough   . Hypertension   . Kyphoscoliosis   . PONV (postoperative nausea and vomiting)   . Recurrent upper respiratory infection (URI)     Past Surgical History:  Procedure Laterality Date  . BRONCHOSCOPY    . LOBECTOMY  12/17/2011   Procedure: LOBECTOMY;  Surgeon: [name], MD;  Location: [facility] OR;  Service: Thoracic;  Laterality: Left;  (L)VATS, WEDGE RESECTION, LEFT UPPER LOBECTOMY,  Multiple node biopsies  . THYROID SURGERY  1980   1/2 thyroid removed - benign  . URETHRA SURGERY      Allergies: Patient has no known allergies.  Medications: Prior to Admission medications   Medication Sig Start  Date End Date Taking? Authorizing Provider  acetaminophen (TYLENOL) 500 MG tablet Take 500 mg by mouth every 6 (six) hours as needed. For pain or fever   Yes [provider]  albuterol (VENTOLIN HFA) 108 (90 Base) MCG/ACT inhaler Inhale 2 puffs into the lungs every 4 (four) hours as needed for wheezing or shortness of breath. For shortness of breath 11/20/15  Yes Byrum, Rose Fillers, MD  alendronate (FOSAMAX) 70 MG tablet Take 70 mg by mouth once a week. Take on Friday with a full glass of water on an empty stomach.   Yes [provider]  amLODipine (NORVASC) 10 MG tablet Take 10 mg by mouth daily.     Yes [provider]  b complex vitamins tablet Take 1 tablet by mouth daily.   Yes [provider]  colestipol (COLESTID) 1 g tablet Take 1 g by mouth daily.   Yes [provider]  fluticasone (FLONASE) 50 MCG/ACT nasal spray instill 2 sprays into each nostril once daily 05/06/17  Yes Byrum, Rose Fillers, MD  montelukast (SINGULAIR) 10 MG tablet Take 1 tablet (10 mg total) by mouth at bedtime. 09/10/17  Yes Collene Gobble, MD  sertraline (ZOLOFT) 25 MG tablet Take 25 mg by mouth at bedtime.   Yes [provider]  tiotropium (SPIRIVA HANDIHALER) 18 MCG inhalation capsule inhale the contents of one capsule in the handihaler once daily Patient taking differently: Place 18 mcg into inhaler and inhale daily.  12/20/16  Yes Collene Gobble, MD  Vitamin D,  Ergocalciferol, (DRISDOL) 50000 units CAPS capsule Take 50,000 Units by mouth 2 (two) times a week. On Monday and Thursday   Yes [provider]  acyclovir ointment (ZOVIRAX) 5 % Apply 1 application topically every 3 (three) hours. 10/29/17   Collene Gobble, MD     History reviewed. No pertinent family history.  Social History   Socioeconomic History  . Marital status: Married    Spouse name: Not on file  . Number of children: Not on file  . Years of education: Not on file  . Highest education  level: Not on file  Occupational History  . Not on file  Social Needs  . Financial resource strain: Not on file  . Food insecurity:    Worry: Not on file    Inability: Not on file  . Transportation needs:    Medical: Not on file    Non-medical: Not on file  Tobacco Use  . Smoking status: Former Smoker    Packs/day: 0.50    Years: 40.00    Pack years: 20.00    Types: Cigarettes    Last attempt to quit: 12/16/2011    Years since quitting: 6.2  . Smokeless tobacco: Never Used  Substance and Sexual Activity  . Alcohol use: Yes    Comment: 1-2 beers daily  . Drug use: No  . Sexual activity: Not on file  Lifestyle  . Physical activity:    Days per week: Not on file    Minutes per session: Not on file  . Stress: Not on file  Relationships  . Social connections:    Talks on phone: Not on file    Gets together: Not on file    Attends religious service: Not on file    Active member of club or organization: Not on file    Attends meetings of clubs or organizations: Not on file    Relationship status: Not on file  Other Topics Concern  . Not on file  Social History Narrative  . Not on file     Review of Systems: A 12 point ROS discussed and pertinent positives are indicated in the HPI above.  All other systems are negative.  Review of Systems  Constitutional: Negative for activity change, fatigue and fever.  Respiratory: Negative for cough and shortness of breath.   Cardiovascular: Negative for chest pain.  Gastrointestinal: Negative for abdominal pain.  Neurological: Negative for weakness.  Psychiatric/Behavioral: Negative for behavioral problems and confusion.    Vital Signs: BP 119/82   Pulse 92   Temp 97.6 F (36.4 C)   Resp 18   Ht 5\' 4"  (1.626 m)   Wt 123 lb (55.8 kg)   BMI 21.11 kg/m   Physical Exam  Constitutional: She is oriented to person, place, and time.  Cardiovascular: Normal rate, regular rhythm and normal heart sounds.  Pulmonary/Chest: Effort  normal and breath sounds normal.  Abdominal: Soft. Bowel sounds are normal.  Musculoskeletal: Normal range of motion.  Neurological: She is alert and oriented to person, place, and time.  Skin: Skin is warm and dry.  Psychiatric: She has a normal mood and affect. Her behavior is normal. Judgment and thought content normal.  Nursing note and vitals reviewed.   Imaging: Ct Chest Wo Contrast  Result Date: 02/10/2018 CLINICAL DATA:  Follow-up pulmonary nodules. History of left upper lobectomy 12/17/2011 for well differentiated lung adenocarcinoma. EXAM: CT CHEST WITHOUT CONTRAST TECHNIQUE: Multidetector CT imaging of the chest was performed following the standard protocol  without IV contrast. COMPARISON:  01/21/2017 chest CT. FINDINGS: Cardiovascular: Normal heart size. No significant pericardial effusion/thickening. Three-vessel coronary atherosclerosis. Atherosclerotic nonaneurysmal thoracic aorta. Normal caliber pulmonary arteries. Mediastinum/Nodes: Status post left hemithyroidectomy. Stable mildly enlarged heterogeneous right thyroid lobe with dominant medial 1.2 cm right thyroid lobe nodule. Unremarkable esophagus. No axillary adenopathy. No pathologically enlarged mediastinal or discrete hilar nodes on this noncontrast scan. Surgical clips are noted in the left axilla. Lungs/Pleura: No pneumothorax. No pleural effusion. Status post left upper lobectomy. Moderate to severe centrilobular and paraseptal emphysema with diffuse bronchial wall thickening. Superior segment left lower lobe irregular pleural-based solid 1.9 x 1.2 cm pulmonary nodule (series 8/image 6), increased from 1.4 x 1.0 cm on 01/21/2017 chest CT and 0.7 x 0.5 cm on 01/09/2016 chest CT. Irregular peripheral basilar left lower lobe 1.1 cm nodular opacity (series 8/image 95), stable back to at least 01/09/2016 chest CT, considered benign. Scattered right upper lobe solid pulmonary nodules, largest 7 mm (series 8/image 55), all stable since  at least 01/09/2016, considered benign. No acute consolidative airspace disease or additional significant pulmonary nodules. Upper abdomen: Numerous simple scattered liver cysts, largest 3.1 cm. Additional subcentimeter hypodense lesions scattered throughout the visualized liver, too small to characterize, not appreciably changed, probably benign. Stable irregular left adrenal gland without discrete adrenal nodules. Stable scarring in the upper left kidney with nonobstructing 6 mm interpolar left renal stone. Musculoskeletal: No aggressive appearing focal osseous lesions. Moderate thoracic spondylosis. IMPRESSION: 1. Enlarging irregular pleural-based solid 1.9 cm pulmonary nodule in the superior segment left lower lobe, suspicious for malignancy, either recurrent disease or a metachronous primary bronchogenic carcinoma. Consider PET-CT for further characterization. 2. Additional scattered bilateral pulmonary nodules are stable and considered benign. 3. No noncontrast CT evidence of thoracic adenopathy. 4. Three-vessel coronary atherosclerosis. 5. Nonobstructing left nephrolithiasis. Aortic Atherosclerosis (ICD10-I70.0) and Emphysema (ICD10-J43.9). These results will be called to the ordering clinician or representative by the Radiologist Assistant, and communication documented in the PACS or zVision Dashboard. Electronically Signed   By: Ilona Sorrel M.D.   On: 02/10/2018 10:53   Nm Pet Image Initial (pi) Skull Base To Thigh  Result Date: 02/18/2018 CLINICAL DATA:  Initial treatment strategy for lung nodule. EXAM: NUCLEAR MEDICINE PET SKULL BASE TO THIGH TECHNIQUE: 6.1 mCi F-18 FDG was injected intravenously. Full-ring PET imaging was performed from the skull base to thigh after the radiotracer. CT data was obtained and used for attenuation correction and anatomic localization. Fasting blood glucose: 110 mg/dl COMPARISON:  02/10/2018, 01/21/2017 and 01/09/2016. FINDINGS: Mediastinal blood pool activity: SUV max  2.3 NECK: No hypermetabolic lymph nodes. Incidental CT findings: None. CHEST: No hypermetabolic mediastinal, hilar or axillary lymph nodes. Apical nodule in the superior segment left lower lobe measures 1.1 x 1.9 cm with an SUV max of 4.2. No additional hypermetabolic pulmonary nodules. Incidental CT findings: Atherosclerotic calcification of the arterial vasculature, including coronary arteries. No pericardial or pleural effusion. Moderate centrilobular and paraseptal emphysema. ABDOMEN/PELVIS: No abnormal hypermetabolism in the liver, adrenal glands, spleen or pancreas. No hypermetabolic lymph nodes. Incidental CT findings: No acute findings. A stone is seen in the left kidney. SKELETON: No abnormal osseous hypermetabolism. Incidental CT findings: Degenerative changes in the spine. IMPRESSION: 1. Hypermetabolic apical nodule in the superior segment left lower lobe is highly worrisome for bronchogenic carcinoma. 2. No additional areas of abnormal hypermetabolism in the neck, chest, abdomen or pelvis. 3. Aortic atherosclerosis (ICD10-170.0). Coronary artery calcification. 4.  Emphysema (ICD10-J43.9). 5. Left renal stone. Electronically Signed  By: Lorin Picket M.D.   On: 02/18/2018 15:40    Labs:  CBC: No results for input(s): WBC, HGB, HCT, PLT in the last 8760 hours.  COAGS: No results for input(s): INR, APTT in the last 8760 hours.  BMP: No results for input(s): NA, K, CL, CO2, GLUCOSE, BUN, CALCIUM, CREATININE, GFRNONAA, GFRAA in the last 8760 hours.  Invalid input(s): CMP  LIVER FUNCTION TESTS: No results for input(s): BILITOT, AST, ALT, ALKPHOS, PROT, ALBUMIN in the last 8760 hours.  TUMOR MARKERS: No results for input(s): AFPTM, CEA, CA199, CHROMGRNA in the last 8760 hours.  Assessment and Plan:  Hx NSC lung cancer-- Left upper lobectomy 2013 Enlarging left lung nodule; +PET Now scheduled for lung nodule biopsy Risks and benefits discussed with the patient including, but not  limited to bleeding, hemoptysis, respiratory failure requiring intubation, infection, pneumothorax requiring chest tube placement, stroke from air embolism or even death.  All of the patient's questions were answered, patient is agreeable to proceed. Consent signed and in chart.    Thank you for this interesting consult.  I greatly enjoyed meeting DORINNE GRAEFF and look forward to participating in their care.  A copy of this report was sent to the requesting provider on this date.  Electronically Signed: Lavonia Drafts, PA-C 03/06/2018, 9:55 AM   I spent a total of  30 Minutes   in face to face in clinical consultation, greater than 50% of which was counseling/coordinating care for L lung nodule bx

## 2018-03-06 NOTE — Procedures (Signed)
Pre procedural Dx: Left lower lobe nodule  Post procedural Dx: Same  Technically successful CT guided biopsy of hypermetabolic left lower lobe pulmonary nodule.    EBL: None.   Complications: None immediate.   Ronny Bacon, MD Pager #: [phone]

## 2018-03-11 ENCOUNTER — Ambulatory Visit (INDEPENDENT_AMBULATORY_CARE_PROVIDER_SITE_OTHER): Payer: Medicare Other | Admitting: Thoracic Surgery (Cardiothoracic Vascular Surgery)

## 2018-03-11 ENCOUNTER — Encounter: Payer: Self-pay | Admitting: Thoracic Surgery (Cardiothoracic Vascular Surgery)

## 2018-03-11 ENCOUNTER — Other Ambulatory Visit: Payer: Self-pay

## 2018-03-11 VITALS — BP 127/82 | HR 84 | Resp 16 | Ht 64.0 in | Wt 124.0 lb

## 2018-03-11 DIAGNOSIS — R768 Other specified abnormal immunological findings in serum: Secondary | ICD-10-CM | POA: Diagnosis not present

## 2018-03-11 DIAGNOSIS — Z85118 Personal history of other malignant neoplasm of bronchus and lung: Secondary | ICD-10-CM | POA: Diagnosis not present

## 2018-03-11 DIAGNOSIS — C3432 Malignant neoplasm of lower lobe, left bronchus or lung: Secondary | ICD-10-CM

## 2018-03-11 DIAGNOSIS — Z902 Acquired absence of lung [part of]: Secondary | ICD-10-CM | POA: Diagnosis not present

## 2018-03-11 DIAGNOSIS — R748 Abnormal levels of other serum enzymes: Secondary | ICD-10-CM | POA: Diagnosis not present

## 2018-03-11 NOTE — Progress Notes (Signed)
[address]       [place] [address]             [phone]     HPI: Mrs. Underhill returns to discuss the results of her biopsy.  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  She has been followed with serial CTs since her resection in 2013.  She recently had her annual CT.  It showed an increase in the size of a left apical nodule.  This was previously thought to be scarring.  PET/CT showed the area was hypermetabolic.  She had a CT-guided needle biopsy on 03/06/2018.  That was uncomplicated.  She has occasional wheezing requiring albuterol.  She is taking Singulair.  She gets short of breath with heavy exertion.  She is not having chest pain, pressure, or tightness.  She has not had any unusual headaches or visual changes.  Her appetite is good and her weight is stable.  She does still work.  Zubrod Score: At the time of surgery this patient's most appropriate activity status/level should be described as: []     0    Normal activity, no symptoms [x]     1    Restricted in physical strenuous activity but ambulatory, able to do out light work []     2    Ambulatory and capable of self care, unable to do work activities, up and about >50 % of waking hours                              []     3    Only limited self care, in bed greater than 50% of waking hours []     4    Completely disabled, no self care, confined to bed or chair []     5    Moribund   Past Medical History:  Diagnosis Date  . Cancer ([facility])    Stage IA non-small cell lung cancer, left upper lobectomy 12/2011  . COPD (chronic obstructive pulmonary disease) ([place])   . Cough 07/29/11   started coughing up blood this am  . Cough   . Hypertension   . Kyphoscoliosis   . PONV (postoperative nausea and vomiting)   . Recurrent upper respiratory infection (URI)     Current  Outpatient Medications  Medication Sig Dispense Refill  . acetaminophen (TYLENOL) 500 MG tablet Take 500 mg by mouth every 6 (six) hours as needed. For pain or fever    . acyclovir ointment (ZOVIRAX) 5 % Apply 1 application topically every 3 (three) hours. 15 g 0  . albuterol (VENTOLIN HFA) 108 (90 Base) MCG/ACT inhaler Inhale 2 puffs into the lungs every 4 (four) hours as needed for wheezing or shortness of breath. For shortness of breath 8.51 g 5  . alendronate (FOSAMAX) 70 MG tablet Take 70 mg by mouth once a week. Take on Friday with a full glass of water on an empty stomach.    Marland Kitchen amLODipine (NORVASC) 10 MG tablet Take 10 mg by mouth daily.      Marland Kitchen b complex vitamins tablet Take 1 tablet by mouth daily.    . colestipol (COLESTID) 1 g tablet Take 1 g by mouth daily.    . fluticasone (FLONASE) 50 MCG/ACT nasal spray instill 2 sprays into each nostril once  daily 16 g 5  . montelukast (SINGULAIR) 10 MG tablet Take 1 tablet (10 mg total) by mouth at bedtime. 30 tablet 5  . sertraline (ZOLOFT) 25 MG tablet Take 25 mg by mouth at bedtime.    Marland Kitchen tiotropium (SPIRIVA HANDIHALER) 18 MCG inhalation capsule inhale the contents of one capsule in the handihaler once daily (Patient taking differently: Place 18 mcg into inhaler and inhale daily. ) 30 capsule 5  . Vitamin D, Ergocalciferol, (DRISDOL) 50000 units CAPS capsule Take 50,000 Units by mouth 2 (two) times a week. On Monday and Thursday     No current facility-administered medications for this visit.     Physical Exam BP 127/82 (BP Location: Right Arm, Patient Position: Sitting, Cuff Size: Normal)   Pulse 84   Resp 16   Ht 5\' 4"  (1.626 m)   Wt 124 lb (56.2 kg)   SpO2 95% Comment: ON RA  BMI 21.53 kg/m  67 year old woman in no acute distress Alert and oriented x3 with no focal deficits No cervical or subclavicular adenopathy Lungs diminished at left base, otherwise clear Cardiac regular rate and rhythm normal S1 and S2 No clubbing cyanosis or  edema Abdomen is benign  Diagnostic Tests: NUCLEAR MEDICINE PET SKULL BASE TO THIGH  TECHNIQUE: 6.1 mCi F-18 FDG was injected intravenously. Full-ring PET imaging was performed from the skull base to thigh after the radiotracer. CT data was obtained and used for attenuation correction and anatomic localization.  Fasting blood glucose: 110 mg/dl  COMPARISON:  02/10/2018, 01/21/2017 and 01/09/2016.  FINDINGS: Mediastinal blood pool activity: SUV max 2.3  NECK: No hypermetabolic lymph nodes.  Incidental CT findings: None.  CHEST: No hypermetabolic mediastinal, hilar or axillary lymph nodes. Apical nodule in the superior segment left lower lobe measures 1.1 x 1.9 cm with an SUV max of 4.2. No additional hypermetabolic pulmonary nodules.  Incidental CT findings: Atherosclerotic calcification of the arterial vasculature, including coronary arteries. No pericardial or pleural effusion. Moderate centrilobular and paraseptal emphysema.  ABDOMEN/PELVIS: No abnormal hypermetabolism in the liver, adrenal glands, spleen or pancreas. No hypermetabolic lymph nodes.  Incidental CT findings: No acute findings. A stone is seen in the left kidney.  SKELETON: No abnormal osseous hypermetabolism.  Incidental CT findings: Degenerative changes in the spine.  IMPRESSION: 1. Hypermetabolic apical nodule in the superior segment left lower lobe is highly worrisome for bronchogenic carcinoma. 2. No additional areas of abnormal hypermetabolism in the neck, chest, abdomen or pelvis. 3. Aortic atherosclerosis (ICD10-170.0). Coronary artery calcification. 4.  Emphysema (ICD10-J43.9). 5. Left renal stone.   Electronically Signed   By: Lorin Picket M.D.   On: 02/18/2018 15:40 Pathology Diagnosis Lung, needle/core biopsy(ies), Left Lower Lobe - ADENOCARCINOMA. Microscopic Comment There is likely sufficient tissue for additional studies if requested (block 1B). Dr. Lyndon Code has  reviewed the case. Vicente Males MD Pathologist, Electronic Signature (Case signed 03/09/2018)  Impression: Mrs. Troutman is a 67 year old woman with a past history of tobacco abuse and COPD.  She had a left upper lobectomy for a stage IA well-differentiated adenocarcinoma 6 years ago.  Her CT recently showed an increase in the size of a left upper lobe nodule.  That was hypermetabolic on PET.  Biopsy showed adenocarcinoma.  She has clinical T1,N0 stage IA disease.  There is potential for pleural involvement which would make it T2N0, stage IB.  I discussed potential treatment options with her including surgical resection versus stereotactic radiation.  We discussed the advantages and disadvantages of each.  I offered  to refer her to radiation oncology for consultation prior to making decision.  She does not wish to do that at this time.  I described the proposed surgical procedure, which would be redo left VATS for segmentectomy.  I informed her of the general nature of the procedure including the need for general anesthesia, the incision to be used, possible need for conversion to thoracotomy, the use of a drainage tube postoperatively, the expected hospital stay, and the overall recovery.  I informed her of the indications, risks, benefits, and alternatives.  She understands the risks include, but are not limited to death, MI, DVT, PE, bleeding, possible need for transfusion, stroke, infection, prolonged air leak, cardiac arrhythmias, respiratory failure, as well as possibility of other unforeseeable complications.  She is strongly leaning towards redo surgery at this point in time.  She does not want to see radiation oncology.  She wants to sleep on it and will let us know tomorrow if she would like to schedule surgery.  She needs pulmonary function testing with and without bronchodilators preoperatively.  Plan: PFTs Redo left VATS for superior segmentectomy  [name], MD [facility] [phone]

## 2018-03-11 NOTE — H&P (View-Only) (Signed)
[address]       [place] [address]             [phone]     HPI: Valerie Mosley returns to discuss the results of her biopsy.  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  She has been followed with serial CTs since her resection in 2013.  She recently had her annual CT.  It showed an increase in the size of a left apical nodule.  This was previously thought to be scarring.  PET/CT showed the area was hypermetabolic.  She had a CT-guided needle biopsy on 03/06/2018.  That was uncomplicated.  She has occasional wheezing requiring albuterol.  She is taking Singulair.  She gets short of breath with heavy exertion.  She is not having chest pain, pressure, or tightness.  She has not had any unusual headaches or visual changes.  Her appetite is good and her weight is stable.  She does still work.  Zubrod Score: At the time of surgery this patient's most appropriate activity status/level should be described as: []     0    Normal activity, no symptoms [x]     1    Restricted in physical strenuous activity but ambulatory, able to do out light work []     2    Ambulatory and capable of self care, unable to do work activities, up and about >50 % of waking hours                              []     3    Only limited self care, in bed greater than 50% of waking hours []     4    Completely disabled, no self care, confined to bed or chair []     5    Moribund   Past Medical History:  Diagnosis Date  . Cancer ([place])    Stage IA non-small cell lung cancer, left upper lobectomy 12/2011  . COPD (chronic obstructive pulmonary disease) ([employer])   . Cough 07/29/11   started coughing up blood this am  . Cough   . Hypertension   . Kyphoscoliosis   . PONV (postoperative nausea and vomiting)   . Recurrent upper respiratory infection (URI)     Current  Outpatient Medications  Medication Sig Dispense Refill  . acetaminophen (TYLENOL) 500 MG tablet Take 500 mg by mouth every 6 (six) hours as needed. For pain or fever    . acyclovir ointment (ZOVIRAX) 5 % Apply 1 application topically every 3 (three) hours. 15 g 0  . albuterol (VENTOLIN HFA) 108 (90 Base) MCG/ACT inhaler Inhale 2 puffs into the lungs every 4 (four) hours as needed for wheezing or shortness of breath. For shortness of breath 8.51 g 5  . alendronate (FOSAMAX) 70 MG tablet Take 70 mg by mouth once a week. Take on Friday with a full glass of water on an empty stomach.    Marland Kitchen amLODipine (NORVASC) 10 MG tablet Take 10 mg by mouth daily.      Marland Kitchen b complex vitamins tablet Take 1 tablet by mouth daily.    . colestipol (COLESTID) 1 g tablet Take 1 g by mouth daily.    . fluticasone (FLONASE) 50 MCG/ACT nasal spray instill 2 sprays into each nostril once  daily 16 g 5  . montelukast (SINGULAIR) 10 MG tablet Take 1 tablet (10 mg total) by mouth at bedtime. 30 tablet 5  . sertraline (ZOLOFT) 25 MG tablet Take 25 mg by mouth at bedtime.    Marland Kitchen tiotropium (SPIRIVA HANDIHALER) 18 MCG inhalation capsule inhale the contents of one capsule in the handihaler once daily (Patient taking differently: Place 18 mcg into inhaler and inhale daily. ) 30 capsule 5  . Vitamin D, Ergocalciferol, (DRISDOL) 50000 units CAPS capsule Take 50,000 Units by mouth 2 (two) times a week. On Monday and Thursday     No current facility-administered medications for this visit.     Physical Exam BP 127/82 (BP Location: Right Arm, Patient Position: Sitting, Cuff Size: Normal)   Pulse 84   Resp 16   Ht 5\' 4"  (1.626 m)   Wt 124 lb (56.2 kg)   SpO2 95% Comment: ON RA  BMI 21.33 kg/m  67 year old woman in no acute distress Alert and oriented x3 with no focal deficits No cervical or subclavicular adenopathy Lungs diminished at left base, otherwise clear Cardiac regular rate and rhythm normal S1 and S2 No clubbing cyanosis or  edema Abdomen is benign  Diagnostic Tests: NUCLEAR MEDICINE PET SKULL BASE TO THIGH  TECHNIQUE: 6.1 mCi F-18 FDG was injected intravenously. Full-ring PET imaging was performed from the skull base to thigh after the radiotracer. CT data was obtained and used for attenuation correction and anatomic localization.  Fasting blood glucose: 110 mg/dl  COMPARISON:  02/10/2018, 01/21/2017 and 01/09/2016.  FINDINGS: Mediastinal blood pool activity: SUV max 2.3  NECK: No hypermetabolic lymph nodes.  Incidental CT findings: None.  CHEST: No hypermetabolic mediastinal, hilar or axillary lymph nodes. Apical nodule in the superior segment left lower lobe measures 1.1 x 1.9 cm with an SUV max of 4.2. No additional hypermetabolic pulmonary nodules.  Incidental CT findings: Atherosclerotic calcification of the arterial vasculature, including coronary arteries. No pericardial or pleural effusion. Moderate centrilobular and paraseptal emphysema.  ABDOMEN/PELVIS: No abnormal hypermetabolism in the liver, adrenal glands, spleen or pancreas. No hypermetabolic lymph nodes.  Incidental CT findings: No acute findings. A stone is seen in the left kidney.  SKELETON: No abnormal osseous hypermetabolism.  Incidental CT findings: Degenerative changes in the spine.  IMPRESSION: 1. Hypermetabolic apical nodule in the superior segment left lower lobe is highly worrisome for bronchogenic carcinoma. 2. No additional areas of abnormal hypermetabolism in the neck, chest, abdomen or pelvis. 3. Aortic atherosclerosis (ICD10-170.0). Coronary artery calcification. 4.  Emphysema (ICD10-J43.9). 5. Left renal stone.   Electronically Signed   By: Lorin Picket M.D.   On: 02/18/2018 15:40 Pathology Diagnosis Lung, needle/core biopsy(ies), Left Lower Lobe - ADENOCARCINOMA. Microscopic Comment There is likely sufficient tissue for additional studies if requested (block 1B). Dr. Lyndon Code has  reviewed the case. Vicente Males MD Pathologist, Electronic Signature (Case signed 03/09/2018)  Impression: Valerie Mosley is a 67 year old woman with a past history of tobacco abuse and COPD.  She had a left upper lobectomy for a stage IA well-differentiated adenocarcinoma 6 years ago.  Her CT recently showed an increase in the size of a left upper lobe nodule.  That was hypermetabolic on PET.  Biopsy showed adenocarcinoma.  She has clinical T1,N0 stage IA disease.  There is potential for pleural involvement which would make it T2N0, stage IB.  I discussed potential treatment options with her including surgical resection versus stereotactic radiation.  We discussed the advantages and disadvantages of each.  I offered  to refer her to radiation oncology for consultation prior to making decision.  She does not wish to do that at this time.  I described the proposed surgical procedure, which would be redo left VATS for segmentectomy.  I informed her of the general nature of the procedure including the need for general anesthesia, the incision to be used, possible need for conversion to thoracotomy, the use of a drainage tube postoperatively, the expected hospital stay, and the overall recovery.  I informed her of the indications, risks, benefits, and alternatives.  She understands the risks include, but are not limited to death, MI, DVT, PE, bleeding, possible need for transfusion, stroke, infection, prolonged air leak, cardiac arrhythmias, respiratory failure, as well as possibility of other unforeseeable complications.  She is strongly leaning towards redo surgery at this point in time.  She does not want to see radiation oncology.  She wants to sleep on it and will let us know tomorrow if she would like to schedule surgery.  She needs pulmonary function testing with and without bronchodilators preoperatively.  Plan: PFTs Redo left VATS for superior segmentectomy  [name], MD [facility] [phone]

## 2018-03-16 ENCOUNTER — Other Ambulatory Visit: Payer: Self-pay | Admitting: *Deleted

## 2018-03-16 ENCOUNTER — Encounter: Payer: Self-pay | Admitting: *Deleted

## 2018-03-16 DIAGNOSIS — C3492 Malignant neoplasm of unspecified part of left bronchus or lung: Secondary | ICD-10-CM

## 2018-03-25 DIAGNOSIS — E785 Hyperlipidemia, unspecified: Secondary | ICD-10-CM | POA: Diagnosis not present

## 2018-03-25 DIAGNOSIS — F325 Major depressive disorder, single episode, in full remission: Secondary | ICD-10-CM | POA: Diagnosis not present

## 2018-03-25 DIAGNOSIS — G4700 Insomnia, unspecified: Secondary | ICD-10-CM | POA: Diagnosis not present

## 2018-03-25 DIAGNOSIS — C3492 Malignant neoplasm of unspecified part of left bronchus or lung: Secondary | ICD-10-CM | POA: Diagnosis not present

## 2018-03-25 DIAGNOSIS — J449 Chronic obstructive pulmonary disease, unspecified: Secondary | ICD-10-CM | POA: Diagnosis not present

## 2018-03-25 DIAGNOSIS — I10 Essential (primary) hypertension: Secondary | ICD-10-CM | POA: Diagnosis not present

## 2018-03-25 DIAGNOSIS — Z1389 Encounter for screening for other disorder: Secondary | ICD-10-CM | POA: Diagnosis not present

## 2018-03-25 DIAGNOSIS — M810 Age-related osteoporosis without current pathological fracture: Secondary | ICD-10-CM | POA: Diagnosis not present

## 2018-03-25 DIAGNOSIS — Z0001 Encounter for general adult medical examination with abnormal findings: Secondary | ICD-10-CM | POA: Diagnosis not present

## 2018-04-03 NOTE — Pre-Procedure Instructions (Signed)
Valerie Mosley  04/03/2018      [facility] [place], [place] [address] Phone: [phone] Fax: [phone]    Your procedure is scheduled on Wednesday, July 24th   Report to [facility] Admitting at 5:30AM              (posted surgery time 7:30a - 11:25a)   Call this number if you have problems the morning of surgery:  [phone]   Remember:   Do not eat any food  or drink any liquids after midnight, Tuesday.   7 days prior to surgery, STOP TAKING any Vitamins, Herbal Supplements, Anti-inflammatories.   Take these medicines the morning of surgery with A SIP OF WATER : Norvasc. Use your inhaler that morning.    Do not wear jewelry, make-up or nail polish.  Do not wear lotions, powders,perfumes, or deodorant.  Do not shave 48 hours prior to surgery.   Do not bring valuables to the hospital.  [facility] is not responsible for any belongings or valuables.  Contacts, dentures or bridgework may not be worn into surgery.  Leave your suitcase in the car.  After surgery it may be brought to your room.  For patients admitted to the hospital, discharge time will be determined by your treatment team.   Special instructions:    [facility]- Preparing For Surgery  Before surgery, you can play an important role. Because skin is not sterile, your skin needs to be as free of germs as possible. You can reduce the number of germs on your skin by washing with CHG (chlorahexidine gluconate) Soap before surgery.  CHG is an antiseptic cleaner which kills germs and bonds with the skin to continue killing germs even after washing.    Oral Hygiene is also important to reduce your risk of infection.    Remember - BRUSH YOUR TEETH THE MORNING OF SURGERY WITH YOUR REGULAR TOOTHPASTE  Please do not use if you have an allergy to CHG or antibacterial soaps. If your skin becomes reddened/irritated stop using the  CHG.  Do not shave (including legs and underarms) for at least 48 hours prior to first CHG shower. It is OK to shave your face.  Please follow these instructions carefully.   1. Shower the NIGHT BEFORE SURGERY and the MORNING OF SURGERY with CHG.   2. If you chose to wash your hair, wash your hair first as usual with your normal shampoo.  3. After you shampoo, rinse your hair and body thoroughly to remove the shampoo.  4. Use CHG as you would any other liquid soap. You can apply CHG directly to the skin and wash gently with a scrungie or a clean washcloth.   5. Apply the CHG Soap to your body ONLY FROM THE NECK DOWN.  Do not use on open wounds or open sores. Avoid contact with your eyes, ears, mouth and genitals (private parts). Wash Face and genitals (private parts)  with your normal soap.  6. Wash thoroughly, paying special attention to the area where your surgery will be performed.  7. Thoroughly rinse your body with warm water from the neck down.  8. DO NOT shower/wash with your normal soap after using and rinsing off the CHG Soap.  9. Pat yourself dry with a CLEAN TOWEL.  10. Wear CLEAN PAJAMAS to bed the night before surgery, wear comfortable clothes the morning of surgery  11.  Place CLEAN SHEETS on your bed the night of your first shower and DO NOT SLEEP WITH PETS.    Day of Surgery:  Do not apply any deodorants/lotions.  Please wear clean clothes to the hospital/surgery center.    Remember to brush your teeth WITH YOUR REGULAR TOOTHPASTE.   Please read over the following fact sheets that you were given. Pain Booklet, Coughing and Deep Breathing, MRSA Information and Surgical Site Infection Prevention

## 2018-04-06 ENCOUNTER — Encounter (HOSPITAL_COMMUNITY): Payer: Self-pay

## 2018-04-06 ENCOUNTER — Ambulatory Visit (HOSPITAL_COMMUNITY)
Admission: RE | Admit: 2018-04-06 | Discharge: 2018-04-06 | Disposition: A | Discharge status: Home / Self Care | Payer: Medicare Other | Source: Ambulatory Visit | Attending: Thoracic Surgery (Cardiothoracic Vascular Surgery) | Admitting: Thoracic Surgery (Cardiothoracic Vascular Surgery)

## 2018-04-06 ENCOUNTER — Encounter (HOSPITAL_COMMUNITY)
Admission: RE | Admit: 2018-04-06 | Discharge: 2018-04-06 | Disposition: A | Discharge status: Home / Self Care | Payer: Medicare Other | Source: Ambulatory Visit | Attending: Thoracic Surgery (Cardiothoracic Vascular Surgery) | Admitting: Thoracic Surgery (Cardiothoracic Vascular Surgery)

## 2018-04-06 ENCOUNTER — Other Ambulatory Visit: Payer: Self-pay

## 2018-04-06 DIAGNOSIS — C3492 Malignant neoplasm of unspecified part of left bronchus or lung: Secondary | ICD-10-CM

## 2018-04-06 DIAGNOSIS — C3432 Malignant neoplasm of lower lobe, left bronchus or lung: Secondary | ICD-10-CM | POA: Diagnosis not present

## 2018-04-06 DIAGNOSIS — J939 Pneumothorax, unspecified: Secondary | ICD-10-CM | POA: Diagnosis not present

## 2018-04-06 DIAGNOSIS — J690 Pneumonitis due to inhalation of food and vomit: Secondary | ICD-10-CM | POA: Diagnosis not present

## 2018-04-06 DIAGNOSIS — Z87891 Personal history of nicotine dependence: Secondary | ICD-10-CM

## 2018-04-06 DIAGNOSIS — J449 Chronic obstructive pulmonary disease, unspecified: Secondary | ICD-10-CM

## 2018-04-06 DIAGNOSIS — J984 Other disorders of lung: Secondary | ICD-10-CM | POA: Diagnosis not present

## 2018-04-06 DIAGNOSIS — I10 Essential (primary) hypertension: Secondary | ICD-10-CM | POA: Diagnosis not present

## 2018-04-06 DIAGNOSIS — Z7983 Long term (current) use of bisphosphonates: Secondary | ICD-10-CM | POA: Diagnosis not present

## 2018-04-06 DIAGNOSIS — Z01810 Encounter for preprocedural cardiovascular examination: Secondary | ICD-10-CM | POA: Insufficient documentation

## 2018-04-06 DIAGNOSIS — T8182XA Emphysema (subcutaneous) resulting from a procedure, initial encounter: Secondary | ICD-10-CM | POA: Diagnosis not present

## 2018-04-06 DIAGNOSIS — J9382 Other air leak: Secondary | ICD-10-CM | POA: Diagnosis not present

## 2018-04-06 DIAGNOSIS — Z01812 Encounter for preprocedural laboratory examination: Secondary | ICD-10-CM | POA: Insufficient documentation

## 2018-04-06 DIAGNOSIS — Z902 Acquired absence of lung [part of]: Secondary | ICD-10-CM | POA: Diagnosis not present

## 2018-04-06 DIAGNOSIS — Z79899 Other long term (current) drug therapy: Secondary | ICD-10-CM

## 2018-04-06 DIAGNOSIS — Y838 Other surgical procedures as the cause of abnormal reaction of the patient, or of later complication, without mention of misadventure at the time of the procedure: Secondary | ICD-10-CM | POA: Diagnosis not present

## 2018-04-06 DIAGNOSIS — J9601 Acute respiratory failure with hypoxia: Secondary | ICD-10-CM | POA: Diagnosis not present

## 2018-04-06 LAB — TYPE AND SCREEN
ABO/RH(D): O POS
Antibody Screen: NEGATIVE

## 2018-04-06 LAB — CBC
HCT: 45 % (ref 36.0–46.0)
Hemoglobin: 14.9 g/dL (ref 12.0–15.0)
MCH: 30.5 pg (ref 26.0–34.0)
MCHC: 33.1 g/dL (ref 30.0–36.0)
MCV: 92.2 fL (ref 78.0–100.0)
Platelets: 320 10*3/uL (ref 150–400)
RBC: 4.88 MIL/uL (ref 3.87–5.11)
RDW: 13.5 % (ref 11.5–15.5)
WBC: 7.4 10*3/uL (ref 4.0–10.5)

## 2018-04-06 LAB — BLOOD GAS, ARTERIAL
Acid-Base Excess: 0.5 mmol/L (ref 0.0–2.0)
Bicarbonate: 23.7 mmol/L (ref 20.0–28.0)
Drawn by: 20517
FIO2: 21
O2 Saturation: 94.6 %
Patient temperature: 98.6
pCO2 arterial: 32.3 mmHg (ref 32.0–48.0)
pH, Arterial: 7.478 — ABNORMAL HIGH (ref 7.350–7.450)
pO2, Arterial: 67.5 mmHg — ABNORMAL LOW (ref 83.0–108.0)

## 2018-04-06 LAB — PULMONARY FUNCTION TEST
DL/VA % pred: 63 %
DL/VA: 3.04 ml/min/mmHg/L
DLCO unc % pred: 35 %
DLCO unc: 8.58 ml/min/mmHg
FEF 25-75 Post: 1.11 L/sec
FEF 25-75 Pre: 0.56 L/sec
FEF2575-%Change-Post: 99 %
FEF2575-%Pred-Post: 57 %
FEF2575-%Pred-Pre: 28 %
FEV1-%Change-Post: 22 %
FEV1-%Pred-Post: 60 %
FEV1-%Pred-Pre: 49 %
FEV1-Post: 1.4 L
FEV1-Pre: 1.14 L
FEV1FVC-%Change-Post: 7 %
FEV1FVC-%Pred-Pre: 81 %
FEV6-%Change-Post: 17 %
FEV6-%Pred-Post: 72 %
FEV6-%Pred-Pre: 61 %
FEV6-Post: 2.11 L
FEV6-Pre: 1.79 L
FEV6FVC-%Change-Post: 1 %
FEV6FVC-%Pred-Post: 104 %
FEV6FVC-%Pred-Pre: 102 %
FVC-%Change-Post: 14 %
FVC-%Pred-Post: 69 %
FVC-%Pred-Pre: 60 %
FVC-Post: 2.11 L
FVC-Pre: 1.84 L
Post FEV1/FVC ratio: 66 %
Post FEV6/FVC ratio: 100 %
Pre FEV1/FVC ratio: 62 %
Pre FEV6/FVC Ratio: 99 %
RV % pred: 123 %
RV: 2.69 L
TLC % pred: 87 %
TLC: 4.42 L

## 2018-04-06 LAB — PROTIME-INR
INR: 0.96
Prothrombin Time: 12.7 seconds (ref 11.4–15.2)

## 2018-04-06 LAB — COMPREHENSIVE METABOLIC PANEL
ALT: 20 U/L (ref 0–44)
AST: 21 U/L (ref 15–41)
Albumin: 3.7 g/dL (ref 3.5–5.0)
Alkaline Phosphatase: 141 U/L — ABNORMAL HIGH (ref 38–126)
Anion gap: 12 (ref 5–15)
BUN: 22 mg/dL (ref 8–23)
CO2: 23 mmol/L (ref 22–32)
Calcium: 9.7 mg/dL (ref 8.9–10.3)
Chloride: 105 mmol/L (ref 98–111)
Creatinine, Ser: 0.78 mg/dL (ref 0.44–1.00)
GFR calc Af Amer: >60 mL/min (ref >60)
GFR calc non Af Amer: >60 mL/min (ref >60)
Glucose, Bld: 103 mg/dL — ABNORMAL HIGH (ref 70–99)
Potassium: 3.7 mmol/L (ref 3.5–5.1)
Sodium: 140 mmol/L (ref 135–145)
Total Bilirubin: 0.5 mg/dL (ref 0.3–1.2)
Total Protein: 6.9 g/dL (ref 6.5–8.1)

## 2018-04-06 LAB — URINALYSIS, ROUTINE W REFLEX MICROSCOPIC
Bacteria, UA: NONE SEEN
Bilirubin Urine: NEGATIVE
Glucose, UA: NEGATIVE mg/dL
Ketones, ur: NEGATIVE mg/dL
Nitrite: NEGATIVE
Protein, ur: NEGATIVE mg/dL
Specific Gravity, Urine: 1.019 (ref 1.005–1.030)
pH: 5 (ref 5.0–8.0)

## 2018-04-06 LAB — APTT: aPTT: 32 seconds (ref 24–36)

## 2018-04-06 LAB — SURGICAL PCR SCREEN
MRSA, PCR: NEGATIVE
Staphylococcus aureus: NEGATIVE

## 2018-04-06 MED ORDER — ALBUTEROL SULFATE (2.5 MG/3ML) 0.083% IN NEBU
2.5000 mg | INHALATION_SOLUTION | Freq: Once | RESPIRATORY_TRACT | Status: AC
  Administered 2018-04-06: 2.5 mg via RESPIRATORY_TRACT

## 2018-04-06 NOTE — Progress Notes (Addendum)
PCP: [name], MD  Cardiologist: pt denies  ITG:PQDIYMEB today per order  Stress test: 02/24/2004 in Epic  ECHO: 07/31/2011 in EPIC  Cardiac Cath: pt denies  Chest x-ray: obtained today per order

## 2018-04-08 ENCOUNTER — Inpatient Hospital Stay (HOSPITAL_COMMUNITY)
Admission: RE | Admit: 2018-04-08 | Discharge: 2018-04-24 | DRG: 163 | Disposition: A | Discharge status: Home Health | Payer: Medicare Other | Attending: Thoracic Surgery (Cardiothoracic Vascular Surgery) | Admitting: Thoracic Surgery (Cardiothoracic Vascular Surgery)

## 2018-04-08 ENCOUNTER — Inpatient Hospital Stay (HOSPITAL_COMMUNITY): Payer: Medicare Other

## 2018-04-08 ENCOUNTER — Inpatient Hospital Stay (HOSPITAL_COMMUNITY): Payer: Medicare Other | Admitting: Physician Assistant

## 2018-04-08 ENCOUNTER — Inpatient Hospital Stay (HOSPITAL_COMMUNITY): Payer: Medicare Other | Admitting: Certified Registered"

## 2018-04-08 ENCOUNTER — Encounter (HOSPITAL_COMMUNITY)
Admission: RE | Disposition: A | Discharge status: Home Health | Payer: Self-pay | Source: Home / Self Care | Attending: Thoracic Surgery (Cardiothoracic Vascular Surgery)

## 2018-04-08 ENCOUNTER — Other Ambulatory Visit: Payer: Self-pay

## 2018-04-08 ENCOUNTER — Encounter (HOSPITAL_COMMUNITY): Payer: Self-pay | Admitting: Surgery

## 2018-04-08 DIAGNOSIS — J441 Chronic obstructive pulmonary disease with (acute) exacerbation: Secondary | ICD-10-CM | POA: Diagnosis not present

## 2018-04-08 DIAGNOSIS — J189 Pneumonia, unspecified organism: Secondary | ICD-10-CM

## 2018-04-08 DIAGNOSIS — C3492 Malignant neoplasm of unspecified part of left bronchus or lung: Secondary | ICD-10-CM

## 2018-04-08 DIAGNOSIS — J9382 Other air leak: Secondary | ICD-10-CM | POA: Diagnosis not present

## 2018-04-08 DIAGNOSIS — Y838 Other surgical procedures as the cause of abnormal reaction of the patient, or of later complication, without mention of misadventure at the time of the procedure: Secondary | ICD-10-CM | POA: Diagnosis not present

## 2018-04-08 DIAGNOSIS — Z79899 Other long term (current) drug therapy: Secondary | ICD-10-CM | POA: Diagnosis not present

## 2018-04-08 DIAGNOSIS — C3432 Malignant neoplasm of lower lobe, left bronchus or lung: Secondary | ICD-10-CM | POA: Diagnosis not present

## 2018-04-08 DIAGNOSIS — J95812 Postprocedural air leak: Secondary | ICD-10-CM | POA: Diagnosis not present

## 2018-04-08 DIAGNOSIS — J9601 Acute respiratory failure with hypoxia: Secondary | ICD-10-CM | POA: Diagnosis not present

## 2018-04-08 DIAGNOSIS — J942 Hemothorax: Secondary | ICD-10-CM | POA: Diagnosis not present

## 2018-04-08 DIAGNOSIS — R918 Other nonspecific abnormal finding of lung field: Secondary | ICD-10-CM | POA: Diagnosis not present

## 2018-04-08 DIAGNOSIS — Z902 Acquired absence of lung [part of]: Secondary | ICD-10-CM | POA: Diagnosis not present

## 2018-04-08 DIAGNOSIS — Z4682 Encounter for fitting and adjustment of non-vascular catheter: Secondary | ICD-10-CM

## 2018-04-08 DIAGNOSIS — J984 Other disorders of lung: Secondary | ICD-10-CM | POA: Diagnosis not present

## 2018-04-08 DIAGNOSIS — Z9689 Presence of other specified functional implants: Secondary | ICD-10-CM

## 2018-04-08 DIAGNOSIS — J9383 Other pneumothorax: Secondary | ICD-10-CM | POA: Diagnosis not present

## 2018-04-08 DIAGNOSIS — Z87891 Personal history of nicotine dependence: Secondary | ICD-10-CM | POA: Diagnosis not present

## 2018-04-08 DIAGNOSIS — J449 Chronic obstructive pulmonary disease, unspecified: Secondary | ICD-10-CM | POA: Diagnosis not present

## 2018-04-08 DIAGNOSIS — R0600 Dyspnea, unspecified: Secondary | ICD-10-CM | POA: Diagnosis not present

## 2018-04-08 DIAGNOSIS — Z7983 Long term (current) use of bisphosphonates: Secondary | ICD-10-CM | POA: Diagnosis not present

## 2018-04-08 DIAGNOSIS — J690 Pneumonitis due to inhalation of food and vomit: Secondary | ICD-10-CM | POA: Diagnosis not present

## 2018-04-08 DIAGNOSIS — J939 Pneumothorax, unspecified: Secondary | ICD-10-CM

## 2018-04-08 DIAGNOSIS — Z09 Encounter for follow-up examination after completed treatment for conditions other than malignant neoplasm: Secondary | ICD-10-CM

## 2018-04-08 DIAGNOSIS — R0602 Shortness of breath: Secondary | ICD-10-CM | POA: Diagnosis not present

## 2018-04-08 DIAGNOSIS — R0781 Pleurodynia: Secondary | ICD-10-CM | POA: Diagnosis not present

## 2018-04-08 DIAGNOSIS — T8182XA Emphysema (subcutaneous) resulting from a procedure, initial encounter: Secondary | ICD-10-CM | POA: Diagnosis not present

## 2018-04-08 DIAGNOSIS — J439 Emphysema, unspecified: Secondary | ICD-10-CM | POA: Diagnosis not present

## 2018-04-08 DIAGNOSIS — I10 Essential (primary) hypertension: Secondary | ICD-10-CM | POA: Diagnosis not present

## 2018-04-08 DIAGNOSIS — M419 Scoliosis, unspecified: Secondary | ICD-10-CM | POA: Diagnosis not present

## 2018-04-08 HISTORY — PX: VIDEO ASSISTED THORACOSCOPY: SHX5073

## 2018-04-08 SURGERY — VIDEO ASSISTED THORACOSCOPY
Anesthesia: General | Site: Chest | Laterality: Left

## 2018-04-08 MED ORDER — TIOTROPIUM BROMIDE MONOHYDRATE 18 MCG IN CAPS
18.0000 ug | ORAL_CAPSULE | Freq: Every day | RESPIRATORY_TRACT | Status: DC
  Administered 2018-04-09: 18 ug via RESPIRATORY_TRACT
  Filled 2018-04-08: qty 5

## 2018-04-08 MED ORDER — FENTANYL CITRATE (PF) 100 MCG/2ML IJ SOLN
25.0000 ug | INTRAMUSCULAR | Status: DC | PRN
  Administered 2018-04-08 (×4): 25 ug via INTRAVENOUS

## 2018-04-08 MED ORDER — FENTANYL CITRATE (PF) 100 MCG/2ML IJ SOLN
INTRAMUSCULAR | Status: AC
  Administered 2018-04-08: 25 ug via INTRAVENOUS
  Filled 2018-04-08: qty 2

## 2018-04-08 MED ORDER — BISACODYL 5 MG PO TBEC
10.0000 mg | DELAYED_RELEASE_TABLET | Freq: Every day | ORAL | Status: DC
  Administered 2018-04-08 – 2018-04-14 (×7): 10 mg via ORAL
  Filled 2018-04-08 (×8): qty 2

## 2018-04-08 MED ORDER — FENTANYL CITRATE (PF) 250 MCG/5ML IJ SOLN
INTRAMUSCULAR | Status: AC
  Filled 2018-04-08: qty 5

## 2018-04-08 MED ORDER — ONDANSETRON HCL 4 MG/2ML IJ SOLN
INTRAMUSCULAR | Status: AC
  Filled 2018-04-08: qty 2

## 2018-04-08 MED ORDER — MIDAZOLAM HCL 2 MG/2ML IJ SOLN
INTRAMUSCULAR | Status: AC
  Filled 2018-04-08: qty 2

## 2018-04-08 MED ORDER — AMLODIPINE BESYLATE 10 MG PO TABS
10.0000 mg | ORAL_TABLET | Freq: Every day | ORAL | Status: DC
  Administered 2018-04-10 – 2018-04-24 (×15): 10 mg via ORAL
  Filled 2018-04-08 (×16): qty 1

## 2018-04-08 MED ORDER — CEFAZOLIN SODIUM-DEXTROSE 2-4 GM/100ML-% IV SOLN
2.0000 g | Freq: Three times a day (TID) | INTRAVENOUS | Status: AC
  Administered 2018-04-08 – 2018-04-09 (×2): 2 g via INTRAVENOUS
  Filled 2018-04-08 (×2): qty 100

## 2018-04-08 MED ORDER — SODIUM CHLORIDE 0.9 % IV SOLN
INTRAVENOUS | Status: DC | PRN
  Administered 2018-04-08: 100 mL

## 2018-04-08 MED ORDER — DEXAMETHASONE SODIUM PHOSPHATE 10 MG/ML IJ SOLN
INTRAMUSCULAR | Status: DC | PRN
  Administered 2018-04-08: 10 mg via INTRAVENOUS

## 2018-04-08 MED ORDER — SODIUM CHLORIDE 0.9% FLUSH: 9.0000 mL | INTRAVENOUS | Status: DC | PRN

## 2018-04-08 MED ORDER — FLUTICASONE PROPIONATE 50 MCG/ACT NA SUSP
2.0000 | Freq: Two times a day (BID) | NASAL | Status: DC | PRN
  Filled 2018-04-08: qty 16

## 2018-04-08 MED ORDER — CEFAZOLIN SODIUM-DEXTROSE 2-4 GM/100ML-% IV SOLN
2.0000 g | INTRAVENOUS | Status: AC
  Administered 2018-04-08: 2 g via INTRAVENOUS
  Filled 2018-04-08: qty 100

## 2018-04-08 MED ORDER — PANTOPRAZOLE SODIUM 40 MG PO TBEC
40.0000 mg | DELAYED_RELEASE_TABLET | Freq: Every day | ORAL | Status: DC
  Administered 2018-04-08 – 2018-04-24 (×17): 40 mg via ORAL
  Filled 2018-04-08 (×17): qty 1

## 2018-04-08 MED ORDER — CEFAZOLIN SODIUM-DEXTROSE 2-3 GM-%(50ML) IV SOLR
INTRAVENOUS | Status: DC | PRN
  Administered 2018-04-08: 2 g via INTRAVENOUS

## 2018-04-08 MED ORDER — FENTANYL CITRATE (PF) 100 MCG/2ML IJ SOLN
INTRAMUSCULAR | Status: DC | PRN
  Administered 2018-04-08 (×5): 50 ug via INTRAVENOUS

## 2018-04-08 MED ORDER — ONDANSETRON HCL 4 MG/2ML IJ SOLN
INTRAMUSCULAR | Status: DC | PRN
  Administered 2018-04-08: 4 mg via INTRAVENOUS

## 2018-04-08 MED ORDER — LACTATED RINGERS IV SOLN
INTRAVENOUS | Status: DC | PRN
  Administered 2018-04-08: 08:00:00 via INTRAVENOUS

## 2018-04-08 MED ORDER — ROCURONIUM BROMIDE 100 MG/10ML IV SOLN: INTRAVENOUS | Status: DC | PRN

## 2018-04-08 MED ORDER — SODIUM CHLORIDE 0.9 % IV SOLN
INTRAVENOUS | Status: DC
  Administered 2018-04-08 – 2018-04-09 (×2): via INTRAVENOUS
  Administered 2018-04-13: 10 mL/h via INTRAVENOUS

## 2018-04-08 MED ORDER — ONDANSETRON HCL 4 MG/2ML IJ SOLN: 4.0000 mg | Freq: Four times a day (QID) | INTRAMUSCULAR | Status: DC | PRN

## 2018-04-08 MED ORDER — FENTANYL 40 MCG/ML IV SOLN
INTRAVENOUS | Status: DC
  Administered 2018-04-08: 1000 ug via INTRAVENOUS
  Administered 2018-04-08: 220 ug via INTRAVENOUS
  Administered 2018-04-08: 260 ug via INTRAVENOUS
  Administered 2018-04-09: 60 ug via INTRAVENOUS
  Administered 2018-04-09: 50 ug via INTRAVENOUS
  Administered 2018-04-09: 40 ug via INTRAVENOUS
  Administered 2018-04-09: 200 ug via INTRAVENOUS
  Administered 2018-04-09 (×2): 150 ug via INTRAVENOUS
  Administered 2018-04-09: 50 ug via INTRAVENOUS
  Administered 2018-04-09: 60 ug via INTRAVENOUS
  Administered 2018-04-10: 90 ug via INTRAVENOUS
  Administered 2018-04-10: 70 ug via INTRAVENOUS
  Administered 2018-04-10: 50 ug via INTRAVENOUS
  Administered 2018-04-10 (×2): 60 ug via INTRAVENOUS
  Administered 2018-04-10: 70 ug via INTRAVENOUS
  Administered 2018-04-11: 120 ug via INTRAVENOUS
  Administered 2018-04-11: 110 ug via INTRAVENOUS
  Administered 2018-04-11: 50 ug via INTRAVENOUS
  Administered 2018-04-11: 11:00:00 via INTRAVENOUS
  Administered 2018-04-11: 120 ug via INTRAVENOUS
  Administered 2018-04-11: 80 ug via INTRAVENOUS
  Administered 2018-04-12: 53.5 mL via INTRAVENOUS
  Administered 2018-04-12: 3.25 mL via INTRAVENOUS
  Administered 2018-04-12: 210 ug via INTRAVENOUS
  Administered 2018-04-12: 130 ug via INTRAVENOUS
  Administered 2018-04-13: 110 ug via INTRAVENOUS
  Administered 2018-04-13: 70 ug via INTRAVENOUS
  Administered 2018-04-13: 90 ug via INTRAVENOUS
  Administered 2018-04-13: 70 ug via INTRAVENOUS
  Administered 2018-04-13: 60 ug via INTRAVENOUS
  Administered 2018-04-13: 06:00:00 via INTRAVENOUS
  Administered 2018-04-14: 110 ug via INTRAVENOUS
  Administered 2018-04-14 (×2): 90 ug via INTRAVENOUS
  Administered 2018-04-14: 70 ug via INTRAVENOUS
  Administered 2018-04-14: 30 ug via INTRAVENOUS
  Administered 2018-04-14: 90 ug via INTRAVENOUS
  Administered 2018-04-15: 40 ug via INTRAVENOUS
  Administered 2018-04-15: 30 ug via INTRAVENOUS
  Administered 2018-04-15: 60 ug via INTRAVENOUS
  Administered 2018-04-15: 70 ug via INTRAVENOUS
  Administered 2018-04-16: 50 ug via INTRAVENOUS
  Administered 2018-04-16: 80 ug via INTRAVENOUS
  Administered 2018-04-16: 30 ug via INTRAVENOUS
  Administered 2018-04-16: 50 ug via INTRAVENOUS
  Administered 2018-04-16: 40 ug via INTRAVENOUS
  Administered 2018-04-17: 0 ug via INTRAVENOUS
  Administered 2018-04-17: 40 ug via INTRAVENOUS
  Administered 2018-04-17: 60 ug via INTRAVENOUS
  Administered 2018-04-17 (×3): 30 ug via INTRAVENOUS
  Administered 2018-04-18 – 2018-04-19 (×2): 50 ug via INTRAVENOUS
  Filled 2018-04-08 (×4): qty 25
  Filled 2018-04-08: qty 1000
  Filled 2018-04-08 (×3): qty 25

## 2018-04-08 MED ORDER — DIPHENHYDRAMINE HCL 12.5 MG/5ML PO ELIX
12.5000 mg | ORAL_SOLUTION | Freq: Four times a day (QID) | ORAL | Status: DC | PRN
  Filled 2018-04-08: qty 5

## 2018-04-08 MED ORDER — ACETAMINOPHEN 500 MG PO TABS
1000.0000 mg | ORAL_TABLET | Freq: Four times a day (QID) | ORAL | Status: AC
  Administered 2018-04-08 – 2018-04-13 (×18): 1000 mg via ORAL
  Filled 2018-04-08 (×19): qty 2

## 2018-04-08 MED ORDER — GLYCOPYRROLATE PF 0.2 MG/ML IJ SOSY
PREFILLED_SYRINGE | INTRAMUSCULAR | Status: AC
Start: 2018-04-08 — End: ?
  Filled 2018-04-08: qty 1

## 2018-04-08 MED ORDER — OXYCODONE HCL 5 MG/5ML PO SOLN: 5.0000 mg | Freq: Once | ORAL | Status: DC | PRN

## 2018-04-08 MED ORDER — ROCURONIUM BROMIDE 10 MG/ML (PF) SYRINGE
PREFILLED_SYRINGE | INTRAVENOUS | Status: DC | PRN
  Administered 2018-04-08: 20 mg via INTRAVENOUS
  Administered 2018-04-08: 60 mg via INTRAVENOUS
  Administered 2018-04-08: 20 mg via INTRAVENOUS

## 2018-04-08 MED ORDER — COLESTIPOL HCL 1 G PO TABS
1.0000 g | ORAL_TABLET | Freq: Every day | ORAL | Status: DC
  Administered 2018-04-08 – 2018-04-23 (×11): 1 g via ORAL
  Filled 2018-04-08 (×18): qty 1

## 2018-04-08 MED ORDER — MIDAZOLAM HCL 5 MG/5ML IJ SOLN
INTRAMUSCULAR | Status: DC | PRN
  Administered 2018-04-08: 2 mg via INTRAVENOUS

## 2018-04-08 MED ORDER — DEXAMETHASONE SODIUM PHOSPHATE 10 MG/ML IJ SOLN
INTRAMUSCULAR | Status: AC
  Filled 2018-04-08: qty 1

## 2018-04-08 MED ORDER — PROPOFOL 10 MG/ML IV BOLUS
INTRAVENOUS | Status: AC
  Filled 2018-04-08: qty 20

## 2018-04-08 MED ORDER — ACETAMINOPHEN 160 MG/5ML PO SOLN: 1000.0000 mg | Freq: Four times a day (QID) | ORAL | Status: AC

## 2018-04-08 MED ORDER — SENNOSIDES-DOCUSATE SODIUM 8.6-50 MG PO TABS
1.0000 | ORAL_TABLET | Freq: Every day | ORAL | Status: DC
  Administered 2018-04-09 – 2018-04-22 (×11): 1 via ORAL
  Filled 2018-04-08 (×15): qty 1

## 2018-04-08 MED ORDER — DEXMEDETOMIDINE HCL 200 MCG/2ML IV SOLN
INTRAVENOUS | Status: DC | PRN
  Administered 2018-04-08: 4 ug via INTRAVENOUS
  Administered 2018-04-08: 8 ug via INTRAVENOUS
  Administered 2018-04-08: 4 ug via INTRAVENOUS

## 2018-04-08 MED ORDER — MONTELUKAST SODIUM 10 MG PO TABS
10.0000 mg | ORAL_TABLET | Freq: Every day | ORAL | Status: DC
  Administered 2018-04-08 – 2018-04-23 (×16): 10 mg via ORAL
  Filled 2018-04-08 (×16): qty 1

## 2018-04-08 MED ORDER — NALOXONE HCL 0.4 MG/ML IJ SOLN: 0.4000 mg | INTRAMUSCULAR | Status: DC | PRN

## 2018-04-08 MED ORDER — POTASSIUM CHLORIDE 10 MEQ/50ML IV SOLN
10.0000 meq | Freq: Every day | INTRAVENOUS | Status: DC | PRN
  Administered 2018-04-10 – 2018-04-13 (×2): 10 meq via INTRAVENOUS
  Filled 2018-04-08 (×2): qty 50

## 2018-04-08 MED ORDER — BUPIVACAINE HCL (PF) 0.5 % IJ SOLN
INTRAMUSCULAR | Status: DC | PRN
  Administered 2018-04-08: 30 mL

## 2018-04-08 MED ORDER — OXYCODONE HCL 5 MG PO TABS: 5.0000 mg | ORAL_TABLET | Freq: Once | ORAL | Status: DC | PRN

## 2018-04-08 MED ORDER — DIPHENHYDRAMINE HCL 50 MG/ML IJ SOLN: 12.5000 mg | Freq: Four times a day (QID) | INTRAMUSCULAR | Status: DC | PRN

## 2018-04-08 MED ORDER — ROCURONIUM BROMIDE 10 MG/ML (PF) SYRINGE
PREFILLED_SYRINGE | INTRAVENOUS | Status: AC
  Filled 2018-04-08: qty 10

## 2018-04-08 MED ORDER — SUGAMMADEX SODIUM 200 MG/2ML IV SOLN
INTRAVENOUS | Status: DC | PRN
  Administered 2018-04-08: 120 mg via INTRAVENOUS

## 2018-04-08 MED ORDER — 0.9 % SODIUM CHLORIDE (POUR BTL) OPTIME
TOPICAL | Status: DC | PRN
  Administered 2018-04-08: 2000 mL

## 2018-04-08 MED ORDER — ONDANSETRON HCL 4 MG/2ML IJ SOLN: 4.0000 mg | Freq: Once | INTRAMUSCULAR | Status: DC | PRN

## 2018-04-08 MED ORDER — ALBUMIN HUMAN 5 % IV SOLN
12.5000 g | Freq: Once | INTRAVENOUS | Status: AC
  Administered 2018-04-08: 12.5 g via INTRAVENOUS

## 2018-04-08 MED ORDER — PROPOFOL 10 MG/ML IV BOLUS
INTRAVENOUS | Status: DC | PRN
  Administered 2018-04-08: 20 mg via INTRAVENOUS
  Administered 2018-04-08: 120 mg via INTRAVENOUS
  Administered 2018-04-08: 10 mg via INTRAVENOUS

## 2018-04-08 MED ORDER — SERTRALINE HCL 50 MG PO TABS
50.0000 mg | ORAL_TABLET | Freq: Every day | ORAL | Status: DC
  Administered 2018-04-08 – 2018-04-23 (×16): 50 mg via ORAL
  Filled 2018-04-08 (×16): qty 1

## 2018-04-08 MED ORDER — SUGAMMADEX SODIUM 200 MG/2ML IV SOLN
INTRAVENOUS | Status: AC
  Filled 2018-04-08: qty 2

## 2018-04-08 MED ORDER — PHENYLEPHRINE HCL 10 MG/ML IJ SOLN
INTRAMUSCULAR | Status: DC | PRN
  Administered 2018-04-08 (×3): 40 ug via INTRAVENOUS
  Administered 2018-04-08 (×3): 80 ug via INTRAVENOUS
  Administered 2018-04-08: 120 ug via INTRAVENOUS
  Administered 2018-04-08 (×2): 80 ug via INTRAVENOUS

## 2018-04-08 MED ORDER — BUPIVACAINE HCL (PF) 0.5 % IJ SOLN
INTRAMUSCULAR | Status: AC
  Filled 2018-04-08: qty 30

## 2018-04-08 MED ORDER — SODIUM CHLORIDE 0.9 % IJ SOLN
INTRAMUSCULAR | Status: DC | PRN
  Administered 2018-04-08: 50 mL via INTRAVENOUS

## 2018-04-08 MED ORDER — ALBUMIN HUMAN 5 % IV SOLN
INTRAVENOUS | Status: AC
  Administered 2018-04-08: 12.5 g via INTRAVENOUS
  Filled 2018-04-08: qty 250

## 2018-04-08 MED ORDER — ENOXAPARIN SODIUM 40 MG/0.4ML ~~LOC~~ SOLN
40.0000 mg | Freq: Every day | SUBCUTANEOUS | Status: DC
  Administered 2018-04-09 – 2018-04-24 (×16): 40 mg via SUBCUTANEOUS
  Filled 2018-04-08 (×15): qty 0.4

## 2018-04-08 MED ORDER — ALBUTEROL SULFATE (2.5 MG/3ML) 0.083% IN NEBU
2.5000 mg | INHALATION_SOLUTION | RESPIRATORY_TRACT | Status: DC
  Administered 2018-04-08 – 2018-04-10 (×11): 2.5 mg via RESPIRATORY_TRACT
  Filled 2018-04-08 (×11): qty 3

## 2018-04-08 MED ORDER — BUPIVACAINE LIPOSOME 1.3 % IJ SUSP
20.0000 mL | Freq: Once | INTRAMUSCULAR | Status: DC
  Filled 2018-04-08: qty 20

## 2018-04-08 SURGICAL SUPPLY — 90 items
ADH SKN CLS APL DERMABOND .7 (GAUZE/BANDAGES/DRESSINGS) ×2
APPLIER CLIP ROT 10 11.4 M/L (STAPLE)
APR CLP MED LRG 11.4X10 (STAPLE)
BAG SPEC RTRVL LRG 6X4 10 (ENDOMECHANICALS) ×2
CANISTER SUCT 3000ML PPV (MISCELLANEOUS) ×3 IMPLANT
CATH THORACIC 28FR (CATHETERS) ×2 IMPLANT
CATH THORACIC 28FR RT ANG (CATHETERS) IMPLANT
CATH THORACIC 36FR (CATHETERS) IMPLANT
CATH THORACIC 36FR RT ANG (CATHETERS) IMPLANT
CLIP APPLIE ROT 10 11.4 M/L (STAPLE) IMPLANT
CLIP VESOCCLUDE MED 6/CT (CLIP) IMPLANT
CONN Y 3/8X3/8X3/8  BEN (MISCELLANEOUS) ×1
CONN Y 3/8X3/8X3/8 BEN (MISCELLANEOUS) ×2 IMPLANT
CONT SPEC 4OZ CLIKSEAL STRL BL (MISCELLANEOUS) ×8 IMPLANT
COVER SURGICAL LIGHT HANDLE (MISCELLANEOUS) ×1 IMPLANT
DERMABOND ADVANCED (GAUZE/BANDAGES/DRESSINGS) ×1
DERMABOND ADVANCED .7 DNX12 (GAUZE/BANDAGES/DRESSINGS) ×1 IMPLANT
DRAIN CHANNEL 28F RND 3/8 FF (WOUND CARE) IMPLANT
DRAIN CHANNEL 32F RND 10.7 FF (WOUND CARE) IMPLANT
DRAPE LAPAROSCOPIC ABDOMINAL (DRAPES) ×3 IMPLANT
DRAPE SLUSH/WARMER DISC (DRAPES) ×3 IMPLANT
ELECT BLADE 6.5 EXT (BLADE) ×2 IMPLANT
ELECT REM PT RETURN 9FT ADLT (ELECTROSURGICAL) ×3
ELECTRODE REM PT RTRN 9FT ADLT (ELECTROSURGICAL) ×2 IMPLANT
GAUZE SPONGE 4X4 12PLY STRL (GAUZE/BANDAGES/DRESSINGS) ×3 IMPLANT
GLOVE BIO SURGEON STRL SZ 6 (GLOVE) ×6 IMPLANT
GLOVE BIO SURGEON STRL SZ 6.5 (GLOVE) ×4 IMPLANT
GLOVE BIO SURGEON STRL SZ7.5 (GLOVE) ×6 IMPLANT
GLOVE BIOGEL PI IND STRL 6 (GLOVE) ×3 IMPLANT
GLOVE BIOGEL PI IND STRL 6.5 (GLOVE) ×4 IMPLANT
GLOVE BIOGEL PI IND STRL 7.0 (GLOVE) ×2 IMPLANT
GLOVE BIOGEL PI INDICATOR 6 (GLOVE) ×3
GLOVE BIOGEL PI INDICATOR 6.5 (GLOVE) ×4
GLOVE BIOGEL PI INDICATOR 7.0 (GLOVE) ×2
GLOVE SURG SIGNA 7.5 PF LTX (GLOVE) ×6 IMPLANT
GOWN STRL REUS W/ TWL LRG LVL3 (GOWN DISPOSABLE) ×6 IMPLANT
GOWN STRL REUS W/ TWL XL LVL3 (GOWN DISPOSABLE) ×2 IMPLANT
GOWN STRL REUS W/TWL LRG LVL3 (GOWN DISPOSABLE) ×12
GOWN STRL REUS W/TWL XL LVL3 (GOWN DISPOSABLE) ×3
HEMOSTAT SURGICEL 2X14 (HEMOSTASIS) IMPLANT
IV CATH 22GX1 FEP (IV SOLUTION) IMPLANT
KIT BASIN OR (CUSTOM PROCEDURE TRAY) ×3 IMPLANT
KIT SUCTION CATH 14FR (SUCTIONS) ×5 IMPLANT
KIT TURNOVER KIT B (KITS) ×3 IMPLANT
NDL 27GX1/2 REG BEVEL ECLIP (NEEDLE) IMPLANT
NDL SPNL 22GX3.5 QUINCKE BK (NEEDLE) IMPLANT
NEEDLE 22X1 1/2 (OR ONLY) (NEEDLE) ×2 IMPLANT
NEEDLE 27GX1/2 REG BEVEL ECLIP (NEEDLE) ×3 IMPLANT
NEEDLE SPNL 22GX3.5 QUINCKE BK (NEEDLE) ×3 IMPLANT
NS IRRIG 1000ML POUR BTL (IV SOLUTION) ×6 IMPLANT
PACK CHEST (CUSTOM PROCEDURE TRAY) ×3 IMPLANT
PAD ARMBOARD 7.5X6 YLW CONV (MISCELLANEOUS) ×6 IMPLANT
POUCH ENDO CATCH II 15MM (MISCELLANEOUS) IMPLANT
POUCH SPECIMEN RETRIEVAL 10MM (ENDOMECHANICALS) ×2 IMPLANT
RELOAD STAPLE 60 3.8 GOLD REG (STAPLE) IMPLANT
RELOAD STAPLER GOLD 60MM (STAPLE) ×8 IMPLANT
SEALANT PROGEL (MISCELLANEOUS) ×2 IMPLANT
SEALANT SURG COSEAL 4ML (VASCULAR PRODUCTS) IMPLANT
SEALANT SURG COSEAL 8ML (VASCULAR PRODUCTS) IMPLANT
SOLUTION ANTI FOG 6CC (MISCELLANEOUS) ×3 IMPLANT
SPECIMEN JAR MEDIUM (MISCELLANEOUS) ×1 IMPLANT
SPONGE INTESTINAL PEANUT (DISPOSABLE) ×4 IMPLANT
SPONGE TONSIL TAPE 1 RFD (DISPOSABLE) ×3 IMPLANT
STAPLE ECHEON FLEX 60 POW ENDO (STAPLE) ×2 IMPLANT
STAPLER RELOAD GOLD 60MM (STAPLE) ×12
SUT PROLENE 4 0 RB 1 (SUTURE)
SUT PROLENE 4-0 RB1 .5 CRCL 36 (SUTURE) IMPLANT
SUT SILK  1 MH (SUTURE) ×2
SUT SILK 1 MH (SUTURE) ×4 IMPLANT
SUT SILK 2 0 SH CR/8 (SUTURE) ×2 IMPLANT
SUT SILK 2 0SH CR/8 30 (SUTURE) IMPLANT
SUT SILK 3 0SH CR/8 30 (SUTURE) IMPLANT
SUT VIC AB 1 CTX 36 (SUTURE) ×3
SUT VIC AB 1 CTX36XBRD ANBCTR (SUTURE) ×1 IMPLANT
SUT VIC AB 2-0 CTX 36 (SUTURE) ×2 IMPLANT
SUT VIC AB 2-0 UR6 27 (SUTURE) ×2 IMPLANT
SUT VIC AB 3-0 MH 27 (SUTURE) IMPLANT
SUT VIC AB 3-0 X1 27 (SUTURE) ×3 IMPLANT
SUT VICRYL 2 TP 1 (SUTURE) IMPLANT
SYR 20CC LL (SYRINGE) IMPLANT
SYSTEM SAHARA CHEST DRAIN ATS (WOUND CARE) ×3 IMPLANT
TAPE CLOTH 4X10 WHT NS (GAUZE/BANDAGES/DRESSINGS) ×3 IMPLANT
TAPE CLOTH SURG 4X10 WHT LF (GAUZE/BANDAGES/DRESSINGS) ×2 IMPLANT
TIP APPLICATOR SPRAY EXTEND 16 (VASCULAR PRODUCTS) ×2 IMPLANT
TOWEL GREEN STERILE (TOWEL DISPOSABLE) ×5 IMPLANT
TOWEL GREEN STERILE FF (TOWEL DISPOSABLE) ×1 IMPLANT
TRAY FOLEY MTR SLVR 16FR STAT (SET/KITS/TRAYS/PACK) ×3 IMPLANT
TROCAR XCEL BLADELESS 5X75MML (TROCAR) ×3 IMPLANT
TROCAR XCEL NON-BLD 5MMX100MML (ENDOMECHANICALS) IMPLANT
WATER STERILE IRR 1000ML POUR (IV SOLUTION) ×6 IMPLANT

## 2018-04-08 NOTE — OR Nursing (Signed)
Neo drip reduced to 61mcg/min for weaning purposes.

## 2018-04-08 NOTE — OR Nursing (Signed)
Pt placed on Neo infusion at 43mcg/min by CRNA.

## 2018-04-08 NOTE — Transfer of Care (Signed)
Immediate Anesthesia Transfer of Care Note  Patient: Valerie Mosley  Procedure(s) Performed: REDO LEFT VIDEO ASSISTED THORACOSCOPY with left lower lobe wedge resection (Left Chest)  Patient Location: PACU  Anesthesia Type:General  Level of Consciousness: awake and patient cooperative  Airway & Oxygen Therapy: Patient Spontanous Breathing and Patient connected to face mask oxygen  Post-op Assessment: Report given to RN and Post -op Vital signs reviewed and stable  Post vital signs: Reviewed and stable  Last Vitals:  Vitals Value Taken Time  BP 91/68 04/08/2018 10:37 AM  Temp    Pulse 83 04/08/2018 10:41 AM  Resp 24 04/08/2018 10:41 AM  SpO2 95 % 04/08/2018 10:41 AM  Vitals shown include unvalidated device data.  Last Pain:  Vitals:   04/08/18 0555  TempSrc:   PainSc: 0-No pain      Patients Stated Pain Goal: 0 (46/96/29 5284)  Complications: No apparent anesthesia complications

## 2018-04-08 NOTE — Brief Op Note (Addendum)
04/08/2018  10:13 AM  PATIENT:  Valerie Mosley  67 y.o. female  PRE-OPERATIVE DIAGNOSIS:  LLL ADENOCARCINOMA  POST-OPERATIVE DIAGNOSIS:  LLL ADENOCARCINOMA  PROCEDURE:  Procedure(s): REDO LEFT VIDEO ASSISTED THORACOSCOPY with left lower lobe wedge resection (Left)  SURGEON:  Surgeon(s) and Role:    * [name], MD - Primary  PHYSICIAN ASSISTANT: WAYNE GOLD PA-C  ANESTHESIA:   general  EBL:  350 mL   BLOOD ADMINISTERED:none  DRAINS: 1 28 F Chest Tube(s) in the LEFT HEMITHORAX   LOCAL MEDICATIONS USED:  BUPIVICAINE   SPECIMEN:  Source of Specimen:  WEDGE RESECTION  DISPOSITION OF SPECIMEN:  PATHOLOGY  COUNTS:  YES  TOURNIQUET:  * No tourniquets in log *  DICTATION: .Other Dictation: Dictation Number PENDING  PLAN OF CARE: Admit to inpatient   PATIENT DISPOSITION:  PACU - hemodynamically stable.   Delay start of Pharmacological VTE agent (>24hrs) due to surgical blood loss or risk of bleeding: yes  FROZEN- NEGATIVE MARGIN  Severe adhesions laterally. Limited exposure. Wedge resection with 2 cm gross margin.

## 2018-04-08 NOTE — Anesthesia Preprocedure Evaluation (Addendum)
Anesthesia Evaluation  Patient identified by MRN, date of birth, ID band Patient awake    Reviewed: Allergy & Precautions, NPO status , Patient's Chart, lab work & pertinent test results  Airway Mallampati: II  TM Distance: >3 FB Neck ROM: Full    Dental  (+) Teeth Intact, Dental Advisory Given   Pulmonary former smoker,    breath sounds clear to auscultation       Cardiovascular hypertension,  Rhythm:Regular Rate:Normal     Neuro/Psych    GI/Hepatic   Endo/Other    Renal/GU      Musculoskeletal   Abdominal   Peds  Hematology   Anesthesia Other Findings   Reproductive/Obstetrics                             Anesthesia Physical Anesthesia Plan  ASA: III  Anesthesia Plan: General   Post-op Pain Management:    Induction: Intravenous  PONV Risk Score and Plan: 1 and Ondansetron and Dexamethasone  Airway Management Planned: Double Lumen EBT  Additional Equipment: Arterial line, CVP and Ultrasound Guidance Line Placement  Intra-op Plan:   Post-operative Plan: Extubation in OR  Informed Consent: I have reviewed the patients History and Physical, chart, labs and discussed the procedure including the risks, benefits and alternatives for the proposed anesthesia with the patient or authorized representative who has indicated his/her understanding and acceptance.   Dental advisory given  Plan Discussed with: CRNA and Anesthesiologist  Anesthesia Plan Comments:         Anesthesia Quick Evaluation

## 2018-04-08 NOTE — Anesthesia Procedure Notes (Signed)
Central Venous Catheter Insertion Performed by: Roberts Gaudy, MD, anesthesiologist Start/End7/24/2019 6:55 AM, 04/08/2018 7:05 AM Patient location: Pre-op. Preanesthetic checklist: patient identified, IV checked, site marked, risks and benefits discussed, surgical consent, monitors and equipment checked, pre-op evaluation, timeout performed and anesthesia consent Lidocaine 1% used for infiltration and patient sedated Hand hygiene performed  and maximum sterile barriers used  Catheter size: 8 Fr Total catheter length 16. Central line was placed.Double lumen Procedure performed using ultrasound guided technique. Ultrasound Notes:anatomy identified, needle tip was noted to be adjacent to the nerve/plexus identified, no ultrasound evidence of intravascular and/or intraneural injection and image(s) printed for medical record Attempts: 1 Following insertion, dressing applied and line sutured. Post procedure assessment: blood return through all ports  Patient tolerated the procedure well with no immediate complications.

## 2018-04-08 NOTE — Anesthesia Postprocedure Evaluation (Signed)
Anesthesia Post Note  Patient: Valerie Mosley  Procedure(s) Performed: REDO LEFT VIDEO ASSISTED THORACOSCOPY with left lower lobe wedge resection (Left Chest)     Patient location during evaluation: Cath Lab Anesthesia Type: General Level of consciousness: awake and alert Pain management: pain level controlled Vital Signs Assessment: post-procedure vital signs reviewed and stable Respiratory status: spontaneous breathing, nonlabored ventilation, respiratory function stable and patient connected to nasal cannula oxygen Cardiovascular status: blood pressure returned to baseline and stable Postop Assessment: no apparent nausea or vomiting Anesthetic complications: no    Last Vitals:  Vitals:   04/08/18 1700 04/08/18 1800  BP:    Pulse: 74 72  Resp: 19 19  Temp:    SpO2: 93% 91%    Last Pain:  Vitals:   04/08/18 1800  TempSrc:   PainSc: 0-No pain                 Chima Astorino COKER

## 2018-04-08 NOTE — OR Nursing (Signed)
Neo drip turned off at this time.

## 2018-04-08 NOTE — Anesthesia Procedure Notes (Signed)
Procedure Name: Intubation Performed by: Lance Coon, CRNA Pre-anesthesia Checklist: Patient identified, Emergency Drugs available, Suction available and Patient being monitored Patient Re-evaluated:Patient Re-evaluated prior to induction Oxygen Delivery Method: Circle System Utilized Preoxygenation: Pre-oxygenation with 100% oxygen Induction Type: IV induction Ventilation: Mask ventilation without difficulty Laryngoscope Size: Mac and 3 Grade View: Grade II Tube type: Oral Endobronchial tube: Double lumen EBT and 35 Fr Number of attempts: 1 Airway Equipment and Method: Stylet,  Oral airway and Fiberoptic brochoscope Placement Confirmation: ETT inserted through vocal cords under direct vision,  positive ETCO2 and breath sounds checked- equal and bilateral Secured at: 27 cm Tube secured with: Tape Dental Injury: Teeth and Oropharynx as per pre-operative assessment

## 2018-04-08 NOTE — Interval H&P Note (Signed)
History and Physical Interval Note:  PFTs OK 04/08/2018 7:17 AM  Valerie Mosley  has presented today for surgery, with the diagnosis of LLL ADENOCARCINOMA  The various methods of treatment have been discussed with the patient and family. After consideration of risks, benefits and other options for treatment, the patient has consented to  Procedure(s): REDO LEFT VIDEO ASSISTED THORACOSCOPY (Left) SUPERIOR SEGMENTECTOMY (Left) as a surgical intervention .  The patient's history has been reviewed, patient examined, no change in status, stable for surgery.  I have reviewed the patient's chart and labs.  Questions were answered to the patient's satisfaction.     [name]

## 2018-04-08 NOTE — Anesthesia Procedure Notes (Addendum)
Arterial Line Insertion Start/End7/24/2019 6:55 AM, 04/08/2018 7:10 AM Performed by: Lance Coon, CRNA, CRNA  Patient location: Pre-op. Preanesthetic checklist: patient identified, IV checked, site marked, risks and benefits discussed, surgical consent, monitors and equipment checked, pre-op evaluation, timeout performed and anesthesia consent Lidocaine 1% used for infiltration and patient sedated Left, radial was placed Catheter size: 20 G Hand hygiene performed , maximum sterile barriers used  and Seldinger technique used Allen's test indicative of satisfactory collateral circulation Attempts: 1 Procedure performed without using ultrasound guided technique. Following insertion, dressing applied and Biopatch. Post procedure assessment: normal  Patient tolerated the procedure well with no immediate complications.

## 2018-04-08 NOTE — Progress Notes (Signed)
RT called to bedside for low spo2. Pt taken off simple mask and placed on 8L HFNC at this time.  Spo2 within normal limits at 93%. All other VS within normal limits. Pt denies SOB, no increased WOB. RT will continue to closely monitor pt

## 2018-04-08 NOTE — Op Note (Signed)
NAME: Valerie Mosley, Valerie Mosley MEDICAL RECORD WU:[id number] ACCOUNT [id number] DATE OF BIRTH:September 15, 1951 FACILITY: [facility] LOCATION: [facility] PHYSICIAN:Zonie Crutcher Chaya Jan, MD  OPERATIVE REPORT  DATE OF PROCEDURE:  04/08/2018  PREOPERATIVE DIAGNOSIS:  Adenocarcinoma of left lower lobe, clinical stage IA (T1N0).  POSTOPERATIVE DIAGNOSIS:  Adenocarcinoma of left lower lobe, clinical stage IA (T1N0).  PROCEDURE:   Redo left video-assisted thoracoscopy,  Wedge resection left lower lobe nodule.  SURGEON:   Revonda Standard. Roxan Hockey, MD  ASSISTANT:  Jadene Pierini, PA-C.  ANESTHESIA:  General.  FINDINGS:  Severe adhesions laterally.  Minimal adhesions at the apex.  Wedge resection performed with 2 cm gross margin. Margin negative for tumor on frozen section.  CLINICAL NOTE:  The patient is a 67 year old woman with a history of tobacco abuse and COPD.  She had a thoracoscopic left upper lobectomy for a stage IA adenocarcinoma in 2013.  She has been followed since then.  She recently has developed a slowly  enlarging left apical(superior segment of lower lobe) nodule on PET CT.  The area was hypermetabolic.  A CT-guided needle biopsy showed adenocarcinoma.  She was offered the option of surgical resection versus stereotactic radiation.  She strongly wished to proceed with surgical  resection even understanding the increased risk of complications in the redo setting.  The indications, risks, benefits, and alternatives were discussed in detail with the patient.  She understood and accepted the risks and agreed to proceed.  DESCRIPTION OF PROCEDURE:  The patient was brought to the preoperative holding area on 04/08/2018.  Anesthesia placed a central line and an arterial blood pressure monitoring line.  She was taken to the Operating Room, anesthetized and intubated with a  double lumen endotracheal tube.  Intravenous antibiotics were administered.  Sequential compression devices were placed on the calves for  DVT prophylaxis.  A Foley catheter was placed.  She was placed in a right lateral decubitus position and the left  chest was prepped and draped in the usual sterile fashion.  The previous chest tube site and the incision were anesthetized locally with a solution containing 20 mL of liposomal bupivacaine, 30 mL of 0.5% bupivacaine and 50 mL of normal saline.  10 mL  was injected into the prior chest tube site and 20 ml at the prior incision.  An incision was made through the previous chest tube site.  The chest was entered bluntly using a hemostat.  A 5 mm port was inserted.  The thoracoscope was advanced, but there  was no clear visual plane.  The previous working incision was opened.  This incision was approximately 5 cm in length in the lateral chest wall.  It was carried through the skin and subcutaneous tissue.  The interspace was entered.  There was a free  space anteriorly, but at the posterior aspect of the incision in the lateral chest wall, there were extensive adhesions.  Opening this incision to allow for placement of the thoracoscope into the chest with some visualization.  Superiorly, there were  minimal thin adhesions of the apex to the apical chest wall. Inferiorly and laterally the adhesions were more dense.  An attempt was made to take down the adhesions from the chest wall.  Initially, a plane was developed but as the takedown proceeded, a  couple of centimeters more laterally, there were dense adhesions with no clear plane.  There were pleural tears and some bleeding from the lung with taking down these incisions.  The decision was made to perform a wedge resection and  not take down any more of the adhesions. The small adhesions  at the apex were taken down using electrocautery.  The nodule was apical.  A wedge resection then was performed, keeping a 2 cm gross margin.  The wedge was performed using an Echelon powered stapler with a 60 mm gold cartridges.  The specimen was placed  into an  endoscopic retrieval bag and removed.  The 2 closest margins were marked and sent for frozen section.  While awaiting those results, hemostasis was achieved.  The chest was copiously irrigated with warm saline.  Progel was applied to the denuded  surface of the lung where the adhesions were taken down laterally.  The frozen section returned showing  bronchial margins free of tumor.  A 28 French chest tube was placed through the original port incision.  A second incision that had been made  anterior to that for placement of the cautery scissors was closed in standard fashion.  Dual lung ventilation was resumed.  The working incision was closed with a #1 Vicryl fascial suture of 2-0 Vicryl subcutaneous suture and 3-0 Vicryl subcuticular  suture.  All sponge, needle and instrument counts were correct at the end of the procedure.    The patient was extubated in the operating room and taken to the [facility] in good condition.  AN/NUANCE  D:04/08/2018 T:04/08/2018 JOB:[id number]

## 2018-04-09 ENCOUNTER — Encounter (HOSPITAL_COMMUNITY): Payer: Self-pay | Admitting: Thoracic Surgery (Cardiothoracic Vascular Surgery)

## 2018-04-09 ENCOUNTER — Inpatient Hospital Stay (HOSPITAL_COMMUNITY): Payer: Medicare Other

## 2018-04-09 DIAGNOSIS — R0600 Dyspnea, unspecified: Secondary | ICD-10-CM

## 2018-04-09 LAB — BLOOD GAS, ARTERIAL
Acid-base deficit: 0.7 mmol/L (ref 0.0–2.0)
Bicarbonate: 24.2 mmol/L (ref 20.0–28.0)
Drawn by: 51155
O2 Content: 10 L/min
O2 Saturation: 87.6 %
Patient temperature: 98.6
pCO2 arterial: 44.8 mmHg (ref 32.0–48.0)
pH, Arterial: 7.351 (ref 7.350–7.450)
pO2, Arterial: 53.7 mmHg — ABNORMAL LOW (ref 83.0–108.0)

## 2018-04-09 LAB — BASIC METABOLIC PANEL
Anion gap: 5 (ref 5–15)
BUN: 14 mg/dL (ref 8–23)
CO2: 23 mmol/L (ref 22–32)
Calcium: 8 mg/dL — ABNORMAL LOW (ref 8.9–10.3)
Chloride: 106 mmol/L (ref 98–111)
Creatinine, Ser: 0.69 mg/dL (ref 0.44–1.00)
GFR calc Af Amer: >60 mL/min (ref >60)
GFR calc non Af Amer: >60 mL/min (ref >60)
Glucose, Bld: 92 mg/dL (ref 70–99)
Potassium: 3.9 mmol/L (ref 3.5–5.1)
Sodium: 134 mmol/L — ABNORMAL LOW (ref 135–145)

## 2018-04-09 LAB — CBC
HCT: 35.5 % — ABNORMAL LOW (ref 36.0–46.0)
Hemoglobin: 11.4 g/dL — ABNORMAL LOW (ref 12.0–15.0)
MCH: 31.3 pg (ref 26.0–34.0)
MCHC: 32.1 g/dL (ref 30.0–36.0)
MCV: 97.5 fL (ref 78.0–100.0)
Platelets: 217 10*3/uL (ref 150–400)
RBC: 3.64 MIL/uL — ABNORMAL LOW (ref 3.87–5.11)
RDW: 13.7 % (ref 11.5–15.5)
WBC: 13.5 10*3/uL — ABNORMAL HIGH (ref 4.0–10.5)

## 2018-04-09 LAB — PROCALCITONIN: Procalcitonin: <0.1 ng/mL

## 2018-04-09 MED ORDER — METOCLOPRAMIDE HCL 5 MG/ML IJ SOLN
10.0000 mg | Freq: Four times a day (QID) | INTRAMUSCULAR | Status: AC
  Administered 2018-04-09 – 2018-04-10 (×4): 10 mg via INTRAVENOUS
  Filled 2018-04-09 (×4): qty 2

## 2018-04-09 MED ORDER — FUROSEMIDE 10 MG/ML IJ SOLN
20.0000 mg | Freq: Once | INTRAMUSCULAR | Status: AC
  Administered 2018-04-09: 20 mg via INTRAVENOUS
  Filled 2018-04-09: qty 2

## 2018-04-09 MED ORDER — PIPERACILLIN-TAZOBACTAM 3.375 G IVPB
3.3750 g | Freq: Three times a day (TID) | INTRAVENOUS | Status: AC
  Administered 2018-04-09 – 2018-04-14 (×14): 3.375 g via INTRAVENOUS
  Filled 2018-04-09 (×14): qty 50

## 2018-04-09 MED ORDER — GUAIFENESIN ER 600 MG PO TB12
600.0000 mg | ORAL_TABLET | Freq: Two times a day (BID) | ORAL | Status: DC
  Administered 2018-04-09 – 2018-04-24 (×31): 600 mg via ORAL
  Filled 2018-04-09 (×31): qty 1

## 2018-04-09 MED ORDER — BUDESONIDE 0.25 MG/2ML IN SUSP
0.2500 mg | Freq: Two times a day (BID) | RESPIRATORY_TRACT | Status: DC
  Administered 2018-04-09 – 2018-04-24 (×30): 0.25 mg via RESPIRATORY_TRACT
  Filled 2018-04-09 (×31): qty 2

## 2018-04-09 MED ORDER — FUROSEMIDE 10 MG/ML IJ SOLN
20.0000 mg | Freq: Two times a day (BID) | INTRAMUSCULAR | Status: DC
Start: 2018-04-09 — End: 2018-04-10
  Administered 2018-04-09 (×2): 20 mg via INTRAVENOUS
  Filled 2018-04-09 (×2): qty 2

## 2018-04-09 MED ORDER — METHYLPREDNISOLONE SODIUM SUCC 40 MG IJ SOLR
40.0000 mg | Freq: Two times a day (BID) | INTRAMUSCULAR | Status: DC
  Administered 2018-04-09 – 2018-04-13 (×9): 40 mg via INTRAVENOUS
  Filled 2018-04-09 (×9): qty 1

## 2018-04-09 NOTE — Progress Notes (Signed)
[address]       [place] [address]             [id number]      1 Day Post-Op Procedure(s) (LRB): REDO LEFT VIDEO ASSISTED THORACOSCOPY with left lower lobe wedge resection (Left) Subjective: Feels SOB, with moderate pain, desats pretty easily, currently on 11 liters high flow [place]  Objective: Vital signs in last 24 hours: Temp:  [96.8 F (36 C)-99 F (37.2 C)] 98.4 F (36.9 C) (07/25 0701) Pulse Rate:  [65-92] 80 (07/25 0741) Cardiac Rhythm: Normal sinus rhythm (07/25 0701) Resp:  [13-26] 20 (07/25 0741) BP: (91-122)/(57-92) 103/60 (07/25 0701) SpO2:  [84 %-97 %] 87 % (07/25 0741) Arterial Line BP: (76-111)/(44-85) 106/69 (07/25 0741)  Hemodynamic parameters for last 24 hours:    Intake/Output from previous day: 07/24 0701 - 07/25 0700 In: 2845 [P.O.:840; I.V.:1905; IV Piggyback:100] Out: 1860 [Urine:1150; Blood:350; Chest Tube:360] Intake/Output this shift: No intake/output data recorded.  General appearance: alert, cooperative and no distress Heart: regular rate and rhythm Lungs: coarse BS, improves some with cough Abdomen: benign Extremities: PAS in place Wound: dressings CDI  Lab Results: Recent Labs    04/06/18 1505 04/09/18 0546  WBC 7.4 13.5*  HGB 14.9 11.4*  HCT 45.0 35.5*  PLT 320 217   BMET:  Recent Labs    04/06/18 1505 04/09/18 0546  NA 140 134*  K 3.7 3.9  CL 105 106  CO2 23 23  GLUCOSE 103* 92  BUN 22 14  CREATININE 0.78 0.69  CALCIUM 9.7 8.0*    PT/INR:  Recent Labs    04/06/18 1505  LABPROT 12.7  INR 0.96   ABG    Component Value Date/Time   PHART 7.351 04/09/2018 0348   HCO3 24.2 04/09/2018 0348   TCO2 24.5 12/18/2011 0523   ACIDBASEDEF 0.7 04/09/2018 0348   O2SAT 87.6 04/09/2018 0348   CBG (last 3)  No results for input(s): GLUCAP in the last 72 hours.  Meds Scheduled Meds: . acetaminophen  1,000 mg Oral Q6H   Or  . acetaminophen (TYLENOL) oral liquid 160 mg/5 mL  1,000 mg Oral Q6H  .  albuterol  2.5 mg Nebulization Q4H while awake  . amLODipine  10 mg Oral Daily  . bisacodyl  10 mg Oral Daily  . colestipol  1 g Oral Daily  . enoxaparin (LOVENOX) injection  40 mg Subcutaneous Daily  . fentaNYL   Intravenous Q4H  . montelukast  10 mg Oral QHS  . pantoprazole  40 mg Oral Daily  . senna-docusate  1 tablet Oral QHS  . sertraline  50 mg Oral QHS  . tiotropium  18 mcg Inhalation Daily   Continuous Infusions: . sodium chloride 100 mL/hr at 04/09/18 0533  . potassium chloride     PRN Meds:.diphenhydrAMINE **OR** diphenhydrAMINE, fluticasone, naloxone **AND** sodium chloride flush, ondansetron (ZOFRAN) IV, potassium chloride  Xrays Dg Chest Port 1 View  Result Date: 04/09/2018 CLINICAL DATA:  Status post video-assisted left thoracotomy. Chest tubes. Shortness of breath. EXAM: PORTABLE CHEST 1 VIEW COMPARISON:  04/08/2018 exams. FINDINGS: Mild increase in hazy airspace opacity has developed along the inferior right upper lobe and at the right lung base. Prominent bronchovascular and interstitial markings bilaterally are similar to the previous day's study. Right lung base opacity now mostly obscures the hemidiaphragm. There is persistent opacity at the left lung base, without change. Pulmonary anastomosis staples are noted the upper lung. There is relative lucency at  the left apex a defined pneumothorax. Left chest tube tip projects at the left apex, stable. Right internal jugular central venous line is also stable, tip at the caval atrial junction. There is a small amount left lateral chest wall subcutaneous air, increased from the previous day's study. IMPRESSION: 1. Mild worsening in lung aeration when compared to the previous day's study, with increased opacity noted in the inferior right upper lobe and at the right lung base, likely due to atelectasis with an associated small right pleural effusion. 2. No change in left lung aeration. No convincing left pneumothorax. 3. Support  apparatus is stable. Electronically Signed   By: Lajean Manes M.D.   On: 04/09/2018 07:13   Dg Chest Port 1 View  Result Date: 04/08/2018 CLINICAL DATA:  Postop day 0 resection of adenocarcinoma involving the SUPERIOR segment of the LEFT LOWER LOBE. Prior LEFT UPPER lobectomy in 2013 for non-small cell lung cancer. EXAM: PORTABLE CHEST 1 VIEW COMPARISON:  04/06/2018, 03/06/2018 and earlier, including PET-CT 02/18/2018 and CT chest 02/10/2018. FINDINGS: RIGHT jugular central venous catheter tip projects over the LOWER SVC. LEFT chest tube in place. Postsurgical changes involving the LEFT lung. Bleb involving the apical aspect of the remaining LEFT lung is favored over pneumothorax. Patchy opacities throughout the remaining LEFT LOWER LOBE. Focal airspace opacity involving the RIGHT lung base. Increased interstitial opacities in the RIGHT lung relative to baseline, superimposed upon emphysema. IMPRESSION: 1. No convincing evidence of pneumothorax with LEFT chest tube in place. A bleb involving the apex of the remaining LEFT LOWER LOBE is favored over pneumothorax. 2. Likely mild interstitial pulmonary edema superimposed upon baseline changes of severe COPD/emphysema, query fluid overload. 3. Postoperative atelectasis is favored over pneumonia involving the RIGHT lung base and the remaining LEFT lung. Electronically Signed   By: Evangeline Dakin M.D.   On: 04/08/2018 11:24    Assessment/Plan: S/P Procedure(s) (LRB): REDO LEFT VIDEO ASSISTED THORACOSCOPY with left lower lobe wedge resection (Left)   1 requires high flow O2 currently, push pulm toilet ABG is ok except hypoxemia.  Cont nebs/add flutter valve- may need pulm consult 2 hemodyn stable in sinus- d/c alone 3 CXR reviewed- no def PNTX. + Air leak- cont CT to suction 4 Tmax 99, WBC 13K- monitor closely  5 cont PCA 6 lytes/ renal fxn ok 7 routine rehab modalities   LOS: 1 day    [name] 04/09/2018 Pager [phone]

## 2018-04-09 NOTE — Consult Note (Signed)
PULMONARY / CRITICAL CARE MEDICINE   Name: ILLONA Mosley MRN: [id number] DOB: 1951/06/03    ADMISSION DATE:  04/08/2018 CONSULTATION DATE:  04/09/18  REFERRING MD:Dr. hendrickson  CHIEF COMPLAINT:  Dyspnea? 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  She has been followed with serial CTs since her resection in 2013.  She recently had her annual CT.  It showed an increase in the size of a left apical nodule.  This was previously thought to be scarring.  PET/CT showed the area was hypermetabolic.  She had a CT-guided needle biopsy on 03/06/2018.  That was uncomplicated.  She has occasional wheezing requiring albuterol.  She is taking Singulair.  She gets short of breath with heavy exertion.  She is not having chest pain, pressure, or tightness.  She has not had any unusual headaches or visual changes.  Her appetite is good and her weight is stable.  She does still work.  The patient underwent VATS yestetrday with a limited LLL wedge resection. I was asked to see the patient today. She is having increased breahtlessness and requiring HFNC. Her new CXR suggest a RUL and RLL infiltrates. The patient hads been afebbrile. Her WBC is uop  A bit [facility], she did have surgery yesterday). Starting to bring up some phlem. The patient had a PFT done 7/222 showing moderate obstructive lung defect with some reversibility and a failrly low diffusion capacity.      PAST MEDICAL HISTORY :  She  has a past medical history of Cancer ([place]), COPD (chronic obstructive pulmonary disease) ([place]), Cough (07/29/11), Cough, Hypertension, Kyphoscoliosis, PONV (postoperative nausea and vomiting), and Recurrent upper respiratory infection (URI).  PAST SURGICAL HISTORY: She  has a past surgical history that includes Urethra surgery;  Thyroid surgery (1980); Bronchoscopy; Lobectomy (12/17/2011); and Video assisted thoracoscopy (Left, 04/08/2018).  No Known Allergies  No current facility-administered medications on file prior to encounter.    Current Outpatient Medications on File Prior to Encounter  Medication Sig  . acetaminophen (TYLENOL) 500 MG tablet Take 1,000 mg by mouth every 6 (six) hours as needed for moderate pain or headache. For pain or fever  . albuterol (VENTOLIN HFA) 108 (90 Base) MCG/ACT inhaler Inhale 2 puffs into the lungs every 4 (four) hours as needed for wheezing or shortness of breath. For shortness of breath  . alendronate (FOSAMAX) 70 MG tablet Take 70 mg by mouth once a week. Take on Friday with a full glass of water on an empty stomach.  Marland Kitchen amLODipine (NORVASC) 10 MG tablet Take 10 mg by mouth daily.    . colestipol (COLESTID) 1 g tablet Take 1 g by mouth daily.  . fluticasone (FLONASE) 50 MCG/ACT nasal spray instill 2 sprays into each nostril once daily (Patient taking differently: instill 2 sprays into each nostril once daily as needed for allergies)  . montelukast (SINGULAIR) 10 MG tablet Take 1 tablet (10 mg total) by mouth at bedtime.  . sertraline (ZOLOFT) 50 MG tablet Take 50 mg by mouth at bedtime.  Marland Kitchen tiotropium (SPIRIVA HANDIHALER) 18 MCG inhalation capsule inhale the contents of one capsule in the handihaler once daily (Patient taking differently: Place 18 mcg into inhaler and inhale daily. )  . Vitamin D, Ergocalciferol, (DRISDOL) 50000 units CAPS capsule Take 50,000 Units by mouth 2 (two) times a week. Tuesdays  and Fridays  . acyclovir ointment (ZOVIRAX) 5 % Apply 1 application topically every 3 (three) hours. (Patient not taking: Reported on 04/03/2018)    FAMILY HISTORY:  Her family history is not on file.  SOCIAL HISTORY: She  reports that she quit smoking about 6 years ago. Her smoking use included cigarettes. She has a 20.00 pack-year smoking history. She has never used smokeless  tobacco. She reports that she drinks alcohol. She reports that she does not use drugs.  REVIEW OF SYSTEMS:   Review of Systems  Constitutional: Negative for chills, fever and malaise/fatigue.  HENT: Negative.   Eyes: Negative.   Respiratory: Positive for cough, sputum production, shortness of breath and wheezing. Negative for hemoptysis.   Cardiovascular: Negative for chest pain and palpitations.  Gastrointestinal: Negative.   Genitourinary: Negative.   Musculoskeletal: Negative.   Skin: Negative for rash.  Neurological: Negative.   Endo/Heme/Allergies: Negative.   Psychiatric/Behavioral: Negative.         VITAL SIGNS: BP 96/60 (BP Location: Right Arm)   Pulse 83   Temp 98.5 F (36.9 C) (Oral)   Resp 20   Ht 5\' 4"  (1.626 m)   Wt 124 lb (56.2 kg)   SpO2 (!) 88%   BMI 21.28 kg/m       INTAKE / OUTPUT: I/O last 3 completed shifts: In: 2845 [P.O.:840; I.V.:1905; IV Piggyback:100] Out: 1860 [Urine:1150; Blood:350; Chest Tube:360]  PHYSICAL EXAMINATION: General:  Patient does not appear to be in distress. Looks a bit older than stated age. Neuro: Alert and oriented. Moving all  extr. HEENT:  [place]/AT, sclerae anicteric Cardiovascular:  RRR, S1S2 Lungs:  Bilateral wheeze Back mild kyphosis Abdomen:  Soft, BS, non-tender  Extr: s c/c/e    LABS:  BMET Recent Labs  Lab 04/06/18 1505 04/09/18 0546  NA 140 134*  K 3.7 3.9  CL 105 106  CO2 23 23  BUN 22 14  CREATININE 0.78 0.69  GLUCOSE 103* 92    Electrolytes Recent Labs  Lab 04/06/18 1505 04/09/18 0546  CALCIUM 9.7 8.0*    CBC Recent Labs  Lab 04/06/18 1505 04/09/18 0546  WBC 7.4 13.5*  HGB 14.9 11.4*  HCT 45.0 35.5*  PLT 320 217    Coag's Recent Labs  Lab 04/06/18 1505  APTT 32  INR 0.96    Sepsis Markers No results for input(s): LATICACIDVEN, PROCALCITON, O2SATVEN in the last 168 hours.  ABG Recent Labs  Lab 04/06/18 1415 04/09/18 0348  PHART 7.478* 7.351  PCO2ART 32.3 44.8   PO2ART 67.5* 53.7*    Liver Enzymes Recent Labs  Lab 04/06/18 1505  AST 21  ALT 20  ALKPHOS 141*  BILITOT 0.5  ALBUMIN 3.7    Cardiac Enzymes No results for input(s): TROPONINI, PROBNP in the last 168 hours.  Glucose No results for input(s): GLUCAP in the last 168 hours.  Imaging Dg Chest Port 1 View  Result Date: 04/09/2018 CLINICAL DATA:  Status post video-assisted left thoracotomy. Chest tubes. Shortness of breath. EXAM: PORTABLE CHEST 1 VIEW COMPARISON:  04/08/2018 exams. FINDINGS: Mild increase in hazy airspace opacity has developed along the inferior right upper lobe and at the right lung base. Prominent bronchovascular and interstitial markings bilaterally are similar to the previous day's study. Right lung base opacity now mostly obscures the hemidiaphragm. There is persistent opacity at the left lung base, without change. Pulmonary anastomosis staples are noted the upper lung. There is relative lucency at the left apex a defined pneumothorax. Left chest tube  tip projects at the left apex, stable. Right internal jugular central venous line is also stable, tip at the caval atrial junction. There is a small amount left lateral chest wall subcutaneous air, increased from the previous day's study. IMPRESSION: 1. Mild worsening in lung aeration when compared to the previous day's study, with increased opacity noted in the inferior right upper lobe and at the right lung base, likely due to atelectasis with an associated small right pleural effusion. 2. No change in left lung aeration. No convincing left pneumothorax. 3. Support apparatus is stable. Electronically Signed   By: Lajean Manes M.D.   On: 04/09/2018 07:13        DISCUSSION: Patient is increasingly short of breath and hypoxemic. There is a new change on her CXR suggesting a new RUL infiltrate and RLL atelctasis vs. pneumonia  ASSESSMENT / PLAN:  PULMONARY  patient on HFNC. If she experiencels worsening  breahtlessness or hypoxemia or wob increases she may need mechanical ventilaltion. I will request sputum culure. I am starting the patient on Zosyn for possible aspiration syndrome. I suggest an expectorant. Will start [place] therapy. Had a slightly elevated PaCO2 today but close to normal pH and she is quitte alert.   CARDIOVASCULAR No history of caridiovascular disease other than HTN. No recent ECHO in the chart. EKG showed LAE, IRBBB, NSR and occasional APCs  RENAL Normal renal fn.*     HEMATOLOGIC  Hb went from 14.9 to 11.4 post-op. Will monitor  INFECTIOUS  ? Aspiration pneumonia in the setting of her recent surgery. Patient will  Have sputum ciuulture and procal. She willl be started on Zosyn empirically       Micheal Likens MD Pulmonary and [place] Pager: [phone] Cell [phone]  04/09/2018, 1:38 PM

## 2018-04-09 NOTE — Progress Notes (Signed)
CTS  Remains on hi flow O2 More lasix ordered this pm Coarse breath sounds with min air leak

## 2018-04-09 NOTE — Progress Notes (Signed)
Wasted 2.5 cc fentanyl 40mg /ml. Witnessed by [employer]

## 2018-04-09 NOTE — Progress Notes (Signed)
Waiting to transfer patient to [place] when bed available. Patient stable, on 15L NRB O2 sats 85-93.

## 2018-04-09 NOTE — Progress Notes (Signed)
Patient's BP 80-90s/50-60s, patient asymptomatic besides drowsiness. Amlodipine held, PA paged.   PA returned page, is in [place] with MD who advised to monitor closely and repage if BP drops further.

## 2018-04-10 ENCOUNTER — Inpatient Hospital Stay (HOSPITAL_COMMUNITY): Payer: Medicare Other

## 2018-04-10 LAB — CBC WITH DIFFERENTIAL/PLATELET
Abs Immature Granulocytes: 0.1 10*3/uL (ref 0.0–0.1)
Basophils Absolute: 0 10*3/uL (ref 0.0–0.1)
Basophils Relative: 0 %
Eosinophils Absolute: 0 10*3/uL (ref 0.0–0.7)
Eosinophils Relative: 0 %
HCT: 35.5 % — ABNORMAL LOW (ref 36.0–46.0)
Hemoglobin: 11.6 g/dL — ABNORMAL LOW (ref 12.0–15.0)
Immature Granulocytes: 1 %
Lymphocytes Relative: 8 %
Lymphs Abs: 1 10*3/uL (ref 0.7–4.0)
MCH: 31.2 pg (ref 26.0–34.0)
MCHC: 32.7 g/dL (ref 30.0–36.0)
MCV: 95.4 fL (ref 78.0–100.0)
Monocytes Absolute: 0.5 10*3/uL (ref 0.1–1.0)
Monocytes Relative: 4 %
Neutro Abs: 10.6 10*3/uL — ABNORMAL HIGH (ref 1.7–7.7)
Neutrophils Relative %: 87 %
Platelets: 226 10*3/uL (ref 150–400)
RBC: 3.72 MIL/uL — ABNORMAL LOW (ref 3.87–5.11)
RDW: 13.7 % (ref 11.5–15.5)
WBC: 12.2 10*3/uL — ABNORMAL HIGH (ref 4.0–10.5)

## 2018-04-10 LAB — COMPREHENSIVE METABOLIC PANEL
ALT: 13 U/L (ref 0–44)
AST: 18 U/L (ref 15–41)
Albumin: 2.9 g/dL — ABNORMAL LOW (ref 3.5–5.0)
Alkaline Phosphatase: 82 U/L (ref 38–126)
Anion gap: 9 (ref 5–15)
BUN: 11 mg/dL (ref 8–23)
CO2: 29 mmol/L (ref 22–32)
Calcium: 8.1 mg/dL — ABNORMAL LOW (ref 8.9–10.3)
Chloride: 99 mmol/L (ref 98–111)
Creatinine, Ser: 0.64 mg/dL (ref 0.44–1.00)
GFR calc Af Amer: >60 mL/min (ref >60)
GFR calc non Af Amer: >60 mL/min (ref >60)
Glucose, Bld: 128 mg/dL — ABNORMAL HIGH (ref 70–99)
Potassium: 3.5 mmol/L (ref 3.5–5.1)
Sodium: 137 mmol/L (ref 135–145)
Total Bilirubin: 0.7 mg/dL (ref 0.3–1.2)
Total Protein: 5.5 g/dL — ABNORMAL LOW (ref 6.5–8.1)

## 2018-04-10 LAB — EXPECTORATED SPUTUM ASSESSMENT W GRAM STAIN, RFLX TO RESP C

## 2018-04-10 MED ORDER — CHLORHEXIDINE GLUCONATE CLOTH 2 % EX PADS
6.0000 | MEDICATED_PAD | Freq: Every day | CUTANEOUS | Status: DC
  Administered 2018-04-10 – 2018-04-14 (×5): 6 via TOPICAL

## 2018-04-10 MED ORDER — SODIUM CHLORIDE 0.9% FLUSH
10.0000 mL | Freq: Two times a day (BID) | INTRAVENOUS | Status: DC
  Administered 2018-04-10 – 2018-04-17 (×10): 10 mL
  Administered 2018-04-17: 20 mL
  Administered 2018-04-18 – 2018-04-19 (×3): 10 mL

## 2018-04-10 MED ORDER — SODIUM CHLORIDE 0.9% FLUSH: 10.0000 mL | INTRAVENOUS | Status: DC | PRN

## 2018-04-10 MED ORDER — POTASSIUM CHLORIDE CRYS ER 20 MEQ PO TBCR
40.0000 meq | EXTENDED_RELEASE_TABLET | Freq: Once | ORAL | Status: AC
  Administered 2018-04-10: 40 meq via ORAL
  Filled 2018-04-10: qty 2

## 2018-04-10 MED ORDER — FUROSEMIDE 10 MG/ML IJ SOLN
20.0000 mg | Freq: Every day | INTRAMUSCULAR | Status: DC
  Administered 2018-04-10: 20 mg via INTRAVENOUS
  Filled 2018-04-10: qty 2

## 2018-04-10 NOTE — Progress Notes (Signed)
      [address]       [place] [address]             [phone]      No complaints this afternoon  BP 111/68 (BP Location: Right Arm)   Pulse 84   Temp 98.4 F (36.9 C) (Oral)   Resp 20   Ht 5\' 4"  (1.626 m)   Wt 124 lb (56.2 kg)   SpO2 100%   BMI 21.28 kg/m \ 10L HFNC   Intake/Output Summary (Last 24 hours) at 04/10/2018 1703 Last data filed at 04/10/2018 1312 Gross per 24 hour  Intake 321 ml  Output 4830 ml  Net -4509 ml   1300 negative today  Slowly improving Continue current care  Kmari Brian C. Roxan Hockey, MD [facility] [phone]

## 2018-04-10 NOTE — Plan of Care (Signed)

## 2018-04-10 NOTE — Progress Notes (Addendum)
TCTS DAILY ICU PROGRESS NOTE                   [address]            [place] [address]          [phone]   2 Days Post-Op Procedure(s) (LRB): REDO LEFT VIDEO ASSISTED THORACOSCOPY with left lower lobe wedge resection (Left)  Total Length of Stay:  LOS: 2 days   Subjective: Looks and feels better this morning, HF [place] at 13 liters currently  Objective: Vital signs in last 24 hours: Temp:  [97.7 F (36.5 C)-99.8 F (37.7 C)] 99.2 F (37.3 C) (07/26 0400) Pulse Rate:  [74-105] 84 (07/26 0017) Cardiac Rhythm: Normal sinus rhythm (07/26 0400) Resp:  [13-37] 20 (07/26 0726) BP: (81-139)/(56-89) 119/75 (07/26 0700) SpO2:  [83 %-99 %] 95 % (07/26 0726) Arterial Line BP: (112)/(64) 112/64 (07/25 0803) FiO2 (%):  [21 %] 21 % (07/26 0415)  Filed Weights   04/08/18 0543  Weight: 56.2 kg (124 lb)    Weight change:    Hemodynamic parameters for last 24 hours:    Intake/Output from previous day: 07/25 0701 - 07/26 0700 In: 508.5 [P.O.:320; I.V.:1; IV Piggyback:187.5] Out: 4790 [Urine:3830; Chest Tube:960]  Intake/Output this shift: No intake/output data recorded.  Current Meds: Scheduled Meds: . acetaminophen  1,000 mg Oral Q6H   Or  . acetaminophen (TYLENOL) oral liquid 160 mg/5 mL  1,000 mg Oral Q6H  . albuterol  2.5 mg Nebulization Q4H while awake  . amLODipine  10 mg Oral Daily  . bisacodyl  10 mg Oral Daily  . budesonide (PULMICORT) nebulizer solution  0.25 mg Nebulization BID  . colestipol  1 g Oral Daily  . enoxaparin (LOVENOX) injection  40 mg Subcutaneous Daily  . fentaNYL   Intravenous Q4H  . furosemide  20 mg Intravenous BID  . guaiFENesin  600 mg Oral BID  . methylPREDNISolone (SOLU-MEDROL) injection  40 mg Intravenous Q12H  . montelukast  10 mg Oral QHS  . pantoprazole  40 mg Oral Daily  . senna-docusate  1 tablet Oral QHS  . sertraline  50 mg Oral QHS   Continuous Infusions: . sodium chloride 10 mL/hr at 04/09/18 7824  .  piperacillin-tazobactam (ZOSYN)  IV 3.375 g (04/10/18 0542)  . potassium chloride 10 mEq (04/10/18 0554)   PRN Meds:.diphenhydrAMINE **OR** diphenhydrAMINE, fluticasone, naloxone **AND** sodium chloride flush, ondansetron (ZOFRAN) IV, potassium chloride  General appearance: alert, cooperative and no distress Heart: regular rate and rhythm Lungs: dim left base Abdomen: soft, nontender Extremities: no edema or calf tenderness Wound: incis healing well  Lab Results: CBC: Recent Labs    04/09/18 0546 04/10/18 0407  WBC 13.5* 12.2*  HGB 11.4* 11.6*  HCT 35.5* 35.5*  PLT 217 226   BMET:  Recent Labs    04/09/18 0546 04/10/18 0407  NA 134* 137  K 3.9 3.5  CL 106 99  CO2 23 29  GLUCOSE 92 128*  BUN 14 11  CREATININE 0.69 0.64  CALCIUM 8.0* 8.1*    CMET: Lab Results  Component Value Date   WBC 12.2 (H) 04/10/2018   HGB 11.6 (L) 04/10/2018   HCT 35.5 (L) 04/10/2018   PLT 226 04/10/2018   GLUCOSE 128 (H) 04/10/2018   ALT 13 04/10/2018   AST 18 04/10/2018   NA 137 04/10/2018   K 3.5 04/10/2018   CL 99 04/10/2018   CREATININE 0.64 04/10/2018   BUN 11 04/10/2018  CO2 29 04/10/2018   TSH 2.880 07/29/2011   INR 0.96 04/06/2018      PT/INR: No results for input(s): LABPROT, INR in the last 72 hours. Radiology: Dg Chest Port 1 View  Result Date: 04/10/2018 CLINICAL DATA:  Follow-up partial left lobectomy EXAM: PORTABLE CHEST 1 VIEW COMPARISON:  04/09/2018 FINDINGS: Right jugular central line is again seen and stable. Cardiac shadow is enlarged but stable. Left chest tube is noted in satisfactory position. Postsurgical changes in the left apex are again seen. Chronic changes in the left lung are noted. Patchy changes in the right upper lobe are seen although improved from the prior study. No acute bony abnormality is noted. IMPRESSION: Improved aeration in the right upper lobe. Stable changes on the left consistent with a prior surgical history. Electronically Signed   By:  Inez Catalina M.D.   On: 04/10/2018 07:24     Assessment/Plan: S/P Procedure(s) (LRB): REDO LEFT VIDEO ASSISTED THORACOSCOPY with left lower lobe wedge resection (Left)    1 clinically improved overall 2 cont aggressive pulm management, nebs, wean O2 as able 3 on Zosyn per PCCM for poss pneumonia 4 volume status improved with diuretic, decrease to q day  5 + air leak- cont CT 6 leukocytosis improving trend, no fevers, Neut ct is elevated at 10.6 7 H/H stable 8 renal fxn is ok , will give a little K+ with 3.5 level  [name] 04/10/2018 7:47 AM  Pager [phone]  Patient seen and examined, agree with above CXR shows some improvement on R with diuresis, still a lot of atelectasis on left Will continue Zosyn another 24 hours then reassess Keep CT to suction for now  [place]. Roxan Hockey, MD [facility] [phone]

## 2018-04-10 NOTE — Care Management Note (Signed)
Case Management Note Marvetta Gibbons RN,BSN Unit [facility] 1-22 Case Manager  [phone]  Patient Details  Name: Valerie Mosley MRN: [id number] Date of Birth: [birth year]  Subjective/Objective:  Pt admitted s/p REDO LEFT VIDEO ASSISTED THORACOSCOPY with left lower lobe wedge resection (Left)                  Action/Plan: PTA pt lived at home with spouse, anticipate return home, CM to follow for transition of care needs  Expected Discharge Date:                  Expected Discharge Plan:  Home/Self Care  In-House Referral:     Discharge planning Services  CM Consult  Post Acute Care Choice:    Choice offered to:     DME Arranged:    DME Agency:     HH Arranged:    [place] Agency:     Status of Service:  In process, will continue to follow  If discussed at Long Length of Stay Meetings, dates discussed:    Additional Comments:  Dawayne Patricia, RN 04/10/2018, 2:56 PM

## 2018-04-10 NOTE — Consult Note (Signed)
PULMONARY / CRITICAL CARE MEDICINE   Name: Valerie Mosley MRN: [id number] DOB: [birth year]    ADMISSION DATE:  04/08/2018 CONSULTATION DATE:  04/09/18  REFERRING MD:Dr. hendrickson  CHIEF COMPLAINT:  Dyspnea? 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  She has been followed with serial CTs since her resection in 2013.  She recently had her annual CT.  It showed an increase in the size of a left apical nodule.  This was previously thought to be scarring.  PET/CT showed the area was hypermetabolic.  She had a CT-guided needle biopsy on 03/06/2018.  That was uncomplicated.  She has occasional wheezing requiring albuterol.  She is taking Singulair.  She gets short of breath with heavy exertion.  She is not having chest pain, pressure, or tightness.  She has not had any unusual headaches or visual changes.  Her appetite is good and her weight is stable.  She does still work.  The patient underwent VATS yestetrday with a limited LLL wedge resection. I was asked to see the patient today. She is having increased breahtlessness and requiring HFNC. Her new CXR suggest a RUL and RLL infiltrates. The patient hads been afebbrile. Her WBC is uop  A bit [facility], she did have surgery yesterday). Starting to bring up some phlem. The patient had a PFT done 7/222 showing moderate obstructive lung defect with some reversibility and a failrly low diffusion capacity.  7/26 The patient is up in the chair and looks clinically well. Believes her breathing is a little easier today. She remains on the HFNC Vitals are stable     PAST MEDICAL HISTORY :  She  has a past medical history of Cancer ([place]), COPD (chronic obstructive pulmonary disease) ([place]), Cough (07/29/11), Cough, Hypertension, Kyphoscoliosis, PONV (postoperative nausea  and vomiting), and Recurrent upper respiratory infection (URI).  PAST SURGICAL HISTORY: She  has a past surgical history that includes Urethra surgery; Thyroid surgery (1980); Bronchoscopy; Lobectomy (12/17/2011); and Video assisted thoracoscopy (Left, 04/08/2018).  No Known Allergies  No current facility-administered medications on file prior to encounter.    Current Outpatient Medications on File Prior to Encounter  Medication Sig  . acetaminophen (TYLENOL) 500 MG tablet Take 1,000 mg by mouth every 6 (six) hours as needed for moderate pain or headache. For pain or fever  . albuterol (VENTOLIN HFA) 108 (90 Base) MCG/ACT inhaler Inhale 2 puffs into the lungs every 4 (four) hours as needed for wheezing or shortness of breath. For shortness of breath  . alendronate (FOSAMAX) 70 MG tablet Take 70 mg by mouth once a week. Take on Friday with a full glass of water on an empty stomach.  Marland Kitchen amLODipine (NORVASC) 10 MG tablet Take 10 mg by mouth daily.    . colestipol (COLESTID) 1 g tablet Take 1 g by mouth daily.  . fluticasone (FLONASE) 50 MCG/ACT nasal spray instill 2 sprays into each nostril once daily (Patient taking differently: instill 2 sprays into each nostril once daily as needed for allergies)  . montelukast (SINGULAIR) 10 MG tablet Take 1 tablet (10 mg total) by mouth at bedtime.  . sertraline (ZOLOFT) 50 MG tablet Take 50 mg by mouth at bedtime.  Marland Kitchen tiotropium (SPIRIVA HANDIHALER) 18 MCG inhalation capsule inhale the contents of one capsule in the handihaler once daily (Patient taking differently: Place 18  mcg into inhaler and inhale daily. )  . Vitamin D, Ergocalciferol, (DRISDOL) 50000 units CAPS capsule Take 50,000 Units by mouth 2 (two) times a week. Tuesdays and Fridays  . acyclovir ointment (ZOVIRAX) 5 % Apply 1 application topically every 3 (three) hours. (Patient not taking: Reported on 04/03/2018)    FAMILY HISTORY:  Her family history is not on file.  SOCIAL HISTORY: She  reports  that she quit smoking about 6 years ago. Her smoking use included cigarettes. She has a 20.00 pack-year smoking history. She has never used smokeless tobacco. She reports that she drinks alcohol. She reports that she does not use drugs.  REVIEW OF SYSTEMS:   Review of Systems  Constitutional: Negative for chills, fever and malaise/fatigue.  HENT: Negative.   Eyes: Negative.   Respiratory: Positive for cough, sputum production, shortness of breath and wheezing. Negative for hemoptysis.   Cardiovascular: Negative for chest pain and palpitations.  Gastrointestinal: Negative.   Genitourinary: Negative.   Musculoskeletal: Negative.   Skin: Negative for rash.  Neurological: Negative.   Endo/Heme/Allergies: Negative.   Psychiatric/Behavioral: Negative.         VITAL SIGNS: BP 119/75   Pulse 84   Temp 98.1 F (36.7 C) (Oral)   Resp 20   Ht 5\' 4"  (1.626 m)   Wt 124 lb (56.2 kg)   SpO2 94%   BMI 21.28 kg/m     FiO2 (%):  [21 %] 21 % INTAKE / OUTPUT: I/O last 3 completed shifts: In: 2128.5 [P.O.:1160; I.V.:681; IV Piggyback:287.5] Out: 6144 [RXVQM:0867; Chest Tube:1220]  PHYSICAL EXAMINATION: General:  Patient does not appear to be in distress. Looks a bit older than stated age. Neuro: Alert and oriented. Moving all  extr. HEENT:  [place]/AT, sclerae anicteric Cardiovascular:  RRR, S1S2 Lungs:  Bilateral wheeze. Decreased BS left lower chest. Chest tube with persisitent air leak on inspiration Back mild kyphosis Abdomen:  Soft, BS, non-tender  Extr: s c/c/e    LABS:  BMET Recent Labs  Lab 04/06/18 1505 04/09/18 0546 04/10/18 0407  NA 140 134* 137  K 3.7 3.9 3.5  CL 105 106 99  CO2 23 23 29   BUN 22 14 11   CREATININE 0.78 0.69 0.64  GLUCOSE 103* 92 128*    Electrolytes Recent Labs  Lab 04/06/18 1505 04/09/18 0546 04/10/18 0407  CALCIUM 9.7 8.0* 8.1*    CBC Recent Labs  Lab 04/06/18 1505 04/09/18 0546 04/10/18 0407  WBC 7.4 13.5* 12.2*  HGB 14.9 11.4*  11.6*  HCT 45.0 35.5* 35.5*  PLT 320 217 226    Coag's Recent Labs  Lab 04/06/18 1505  APTT 32  INR 0.96    Sepsis Markers Recent Labs  Lab 04/09/18 1420  PROCALCITON <0.10    ABG Recent Labs  Lab 04/06/18 1415 04/09/18 0348  PHART 7.478* 7.351  PCO2ART 32.3 44.8  PO2ART 67.5* 53.7*    Liver Enzymes Recent Labs  Lab 04/06/18 1505 04/10/18 0407  AST 21 18  ALT 20 13  ALKPHOS 141* 82  BILITOT 0.5 0.7  ALBUMIN 3.7 2.9*    Cardiac Enzymes No results for input(s): TROPONINI, PROBNP in the last 168 hours.  Glucose No results for input(s): GLUCAP in the last 168 hours.  Imaging Dg Chest Port 1 View  Result Date: 04/10/2018 CLINICAL DATA:  Follow-up partial left lobectomy EXAM: PORTABLE CHEST 1 VIEW COMPARISON:  04/09/2018 FINDINGS: Right jugular central line is again seen and stable. Cardiac shadow is enlarged but stable. Left chest tube  is noted in satisfactory position. Postsurgical changes in the left apex are again seen. Chronic changes in the left lung are noted. Patchy changes in the right upper lobe are seen although improved from the prior study. No acute bony abnormality is noted. IMPRESSION: Improved aeration in the right upper lobe. Stable changes on the left consistent with a prior surgical history. Electronically Signed   By: Inez Catalina M.D.   On: 04/10/2018 07:24        DISCUSSION: Patient is increasingly short of breath and hypoxemic. There is a new change on her CXR suggesting a new RUL infiltrate and RLL atelctasis vs. pneumonia  ASSESSMENT / PLAN:  PULMONARY  patient on HFNC. If she experiencels worsening breahtlessness or hypoxemia or wob increases she may need mechanical ventilaltion. I will request sputum culure. I am starting the patient on Zosyn for possible aspiration syndrome. I suggest an expectorant. Will start [place] therapy. Had a slightly elevated PaCO2 today but close to normal pH and she is quitte alert.  7/26 The  patient had improvement in the RUL density. Has increased atelectasis in the left lung with some further loss of volume. She is getting expectorants, duoneb and metaneb therapy. I put her  On Zosyn empirically. If for some reason the CXR loks increasingly worse or her 02 needs rise she may need FOB   CARDIOVASCULAR No history of caridiovascular disease other than HTN. No recent ECHO in the chart. EKG showed LAE, IRBBB, NSR and occasional APCs  RENAL Normal renal fn.*     HEMATOLOGIC  Hb went from 14.9 to 11.4 post-op. Will monitor  INFECTIOUS  ? Aspiration pneumonia in the setting of her recent surgery. Patient will  Have sputum ciuulture and procal. She willl be started on Zosyn empirically       Micheal Likens MD Pulmonary and [place] Pager: [phone] Cell [phone]  04/10/2018, 8:55 AM

## 2018-04-11 ENCOUNTER — Inpatient Hospital Stay (HOSPITAL_COMMUNITY): Payer: Medicare Other

## 2018-04-11 LAB — BASIC METABOLIC PANEL
Anion gap: 7 (ref 5–15)
BUN: 9 mg/dL (ref 8–23)
CO2: 30 mmol/L (ref 22–32)
Calcium: 8.3 mg/dL — ABNORMAL LOW (ref 8.9–10.3)
Chloride: 101 mmol/L (ref 98–111)
Creatinine, Ser: 0.55 mg/dL (ref 0.44–1.00)
GFR calc Af Amer: >60 mL/min (ref >60)
GFR calc non Af Amer: >60 mL/min (ref >60)
Glucose, Bld: 116 mg/dL — ABNORMAL HIGH (ref 70–99)
Potassium: 3.7 mmol/L (ref 3.5–5.1)
Sodium: 138 mmol/L (ref 135–145)

## 2018-04-11 MED ORDER — ALBUTEROL SULFATE (2.5 MG/3ML) 0.083% IN NEBU
2.5000 mg | INHALATION_SOLUTION | Freq: Three times a day (TID) | RESPIRATORY_TRACT | Status: DC
  Administered 2018-04-11 – 2018-04-18 (×24): 2.5 mg via RESPIRATORY_TRACT
  Filled 2018-04-11 (×25): qty 3

## 2018-04-11 MED ORDER — POTASSIUM CHLORIDE CRYS ER 20 MEQ PO TBCR
20.0000 meq | EXTENDED_RELEASE_TABLET | ORAL | Status: AC
  Administered 2018-04-11 (×3): 20 meq via ORAL
  Filled 2018-04-11 (×3): qty 1

## 2018-04-11 NOTE — Progress Notes (Signed)
      [address]       [place] [address]             [phone]      Ambulating presently  BP 100/67   Pulse 82   Temp 98.3 F (36.8 C) (Oral)   Resp (!) 29   Ht 5\' 4"  (1.626 m)   Wt 124 lb (56.2 kg)   SpO2 90%   BMI 21.28 kg/m   Intake/Output Summary (Last 24 hours) at 04/11/2018 1635 Last data filed at 04/11/2018 1400 Gross per 24 hour  Intake 382.5 ml  Output 3370 ml  Net -2987.5 ml   Down to 7L [place] currently  Vanderbilt C. Roxan Hockey, MD [facility] [phone]

## 2018-04-11 NOTE — Consult Note (Signed)
PULMONARY / CRITICAL CARE MEDICINE   Name: Valerie Mosley MRN: [id number] DOB: 02-01-1951    ADMISSION DATE:  04/08/2018 CONSULTATION DATE:  04/09/18  REFERRING MD:Dr. hendrickson  CHIEF COMPLAINT:  Dyspnea? 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  She has been followed with serial CTs since her resection in 2013.  She recently had her annual CT.  It showed an increase in the size of a left apical nodule.  This was previously thought to be scarring.  PET/CT showed the area was hypermetabolic.  She had a CT-guided needle biopsy on 03/06/2018.  That was uncomplicated.  She has occasional wheezing requiring albuterol.  She is taking Singulair.  She gets short of breath with heavy exertion.  She is not having chest pain, pressure, or tightness.  She has not had any unusual headaches or visual changes.  Her appetite is good and her weight is stable.  She does still work.  The patient underwent VATS yestetrday with a limited LLL wedge resection. I was asked to see the patient today. She is having increased breahtlessness and requiring HFNC. Her new CXR suggest a RUL and RLL infiltrates. The patient hads been afebbrile. Her WBC is uop  A bit [facility], she did have surgery yesterday). Starting to bring up some phlem. The patient had a PFT done 7/222 showing moderate obstructive lung defect with some reversibility and a failrly low diffusion capacity.  7/26 The patient is up in the chair and looks clinically well. Believes her breathing is a little easier today. She remains on the HFNC Vitals are stable.  7/27 The patient appears fairly comfortable. Had a quiet night. overla it asppears that her respiratory status has improved. There remains what appears to be some atelectasis at the right base. There is a  small apical PTX on the left. There is apersistent air leak in the Chest tube     PAST MEDICAL HISTORY :  She  has a past medical history of Cancer ([place]), COPD (chronic obstructive pulmonary disease) ([place]), Cough (07/29/11), Cough, Hypertension, Kyphoscoliosis, PONV (postoperative nausea and vomiting), and Recurrent upper respiratory infection (URI).  PAST SURGICAL HISTORY: She  has a past surgical history that includes Urethra surgery; Thyroid surgery (1980); Bronchoscopy; Lobectomy (12/17/2011); and Video assisted thoracoscopy (Left, 04/08/2018).  No Known Allergies  No current facility-administered medications on file prior to encounter.    Current Outpatient Medications on File Prior to Encounter  Medication Sig  . acetaminophen (TYLENOL) 500 MG tablet Take 1,000 mg by mouth every 6 (six) hours as needed for moderate pain or headache. For pain or fever  . albuterol (VENTOLIN HFA) 108 (90 Base) MCG/ACT inhaler Inhale 2 puffs into the lungs every 4 (four) hours as needed for wheezing or shortness of breath. For shortness of breath  . alendronate (FOSAMAX) 70 MG tablet Take 70 mg by mouth once a week. Take on Friday with a full glass of water on an empty stomach.  Marland Kitchen amLODipine (NORVASC) 10 MG tablet Take 10 mg by mouth daily.    . colestipol (COLESTID) 1 g tablet Take 1 g by mouth daily.  . fluticasone (FLONASE) 50 MCG/ACT nasal spray instill 2 sprays into each nostril once daily (Patient taking differently: instill 2 sprays into each nostril once daily as needed for allergies)  . montelukast (SINGULAIR) 10 MG  tablet Take 1 tablet (10 mg total) by mouth at bedtime.  . sertraline (ZOLOFT) 50 MG tablet Take 50 mg by mouth at bedtime.  Marland Kitchen tiotropium (SPIRIVA HANDIHALER) 18 MCG inhalation capsule inhale the contents of one capsule in the handihaler once daily (Patient taking differently: Place 18 mcg into inhaler and inhale daily. )  . Vitamin D, Ergocalciferol, (DRISDOL) 50000 units CAPS capsule  Take 50,000 Units by mouth 2 (two) times a week. Tuesdays and Fridays  . acyclovir ointment (ZOVIRAX) 5 % Apply 1 application topically every 3 (three) hours. (Patient not taking: Reported on 04/03/2018)    FAMILY HISTORY:  Her family history is not on file.  SOCIAL HISTORY: She  reports that she quit smoking about 6 years ago. Her smoking use included cigarettes. She has a 20.00 pack-year smoking history. She has never used smokeless tobacco. She reports that she drinks alcohol. She reports that she does not use drugs.  REVIEW OF SYSTEMS:   Review of Systems  Constitutional: Negative for chills, fever and malaise/fatigue.  HENT: Negative.   Eyes: Negative.   Respiratory: Positive for cough, sputum production, shortness of breath and wheezing. Negative for hemoptysis.   Cardiovascular: Negative for chest pain and palpitations.  Gastrointestinal: Negative.   Genitourinary: Negative.   Musculoskeletal: Negative.   Skin: Negative for rash.  Neurological: Negative.   Endo/Heme/Allergies: Negative.   Psychiatric/Behavioral: Negative.         VITAL SIGNS: BP 105/65   Pulse 75   Temp 98.1 F (36.7 C) (Oral)   Resp 18   Ht 5\' 4"  (1.626 m)   Wt 124 lb (56.2 kg)   SpO2 98%   BMI 21.28 kg/m       INTAKE / OUTPUT: I/O last 3 completed shifts: In: 558.5 [P.O.:120; I.V.:1; IV Piggyback:437.5] Out: 6385 [Urine:5790; Chest Tube:595]  PHYSICAL EXAMINATION: General:  Patient does not appear to be in distress. Looks a bit older than stated age. Neuro: Alert and oriented. Moving all  extr. HEENT:  [place]/AT, sclerae anicteric Cardiovascular:  RRR, S1S2 Lungs:  Bilateral wheeze. Decreased BS left lower chest and right lower chest. Chest tube with persisitent air leak on inspiration. Back mild kyphosis Abdomen:  Soft, BS, non-tender  Extr: s c/c/e    LABS:  BMET Recent Labs  Lab 04/09/18 0546 04/10/18 0407 04/11/18 0402  NA 134* 137 138  K 3.9 3.5 3.7  CL 106 99 101  CO2 23  29 30   BUN 14 11 9   CREATININE 0.69 0.64 0.55  GLUCOSE 92 128* 116*    Electrolytes Recent Labs  Lab 04/09/18 0546 04/10/18 0407 04/11/18 0402  CALCIUM 8.0* 8.1* 8.3*    CBC Recent Labs  Lab 04/06/18 1505 04/09/18 0546 04/10/18 0407  WBC 7.4 13.5* 12.2*  HGB 14.9 11.4* 11.6*  HCT 45.0 35.5* 35.5*  PLT 320 217 226    Coag's Recent Labs  Lab 04/06/18 1505  APTT 32  INR 0.96    Sepsis Markers Recent Labs  Lab 04/09/18 1420  PROCALCITON <0.10    ABG Recent Labs  Lab 04/06/18 1415 04/09/18 0348  PHART 7.478* 7.351  PCO2ART 32.3 44.8  PO2ART 67.5* 53.7*    Liver Enzymes Recent Labs  Lab 04/06/18 1505 04/10/18 0407  AST 21 18  ALT 20 13  ALKPHOS 141* 82  BILITOT 0.5 0.7  ALBUMIN 3.7 2.9*    Cardiac Enzymes No results for input(s): TROPONINI, PROBNP in the last 168 hours.  Glucose No results for input(s): GLUCAP in  the last 168 hours.  Imaging Dg Chest Port 1 View  Result Date: 04/11/2018 CLINICAL DATA:  Status post left partial lobectomy EXAM: PORTABLE CHEST 1 VIEW COMPARISON:  04/10/2018 FINDINGS: Cardiac shadow is stable. Volume loss is again noted on the left consistent with the recent surgery. Chest tube is noted in place with apical pneumothorax likely related to incomplete re-expansion of the left lung. This is better visualized than on the prior exam. The right lung again demonstrates some mild interstitial changes. No new focal abnormality is noted. Right jugular central line is again seen. IMPRESSION: Better visualized pneumothorax in the left apex likely related incomplete re-expansion of the left lung. Interstitial changes in the right lung stable from the prior study. Electronically Signed   By: Inez Catalina M.D.   On: 04/11/2018 07:40        DISCUSSION: Patient is increasingly short of breath and hypoxemic. There is a new change on her CXR suggesting a new RUL infiltrate and RLL atelctasis vs. pneumonia  ASSESSMENT /  PLAN:  PULMONARY  patient on HFNC. If she experiencels worsening breahtlessness or hypoxemia or wob increases she may need mechanical ventilaltion. I will request sputum culure. I am starting the patient on Zosyn for possible aspiration syndrome. I suggest an expectorant. Will start [place] therapy. Had a slightly elevated PaCO2 today but close to normal pH and she is quitte alert.  7/26 The patient had improvement in the RUL density. Has increased atelectasis in the left lung with some further loss of volume. She is getting expectorants, duoneb and metaneb therapy. I put her  On Zosyn empirically. If for some reason the CXR loks increasingly worse or her 02 needs rise she may need FOB 7/27 Small apical PTX about 2 cm. perisistent air leak left CT. The left lung has progressively expanded from earlier. perisistent atelectasis at the right base. The patient is on day of Zosyn. Gram stain showed many WBcs but no organisms  CARDIOVASCULAR No history of caridiovascular disease other than HTN. No recent ECHO in the chart. EKG showed LAE, IRBBB, NSR and occasional APCs.  RENAL Normal renal fn. No change in renal fn.     HEMATOLOGIC  Hb went from 14.9 to 11.4 post-op. Will monitor. Hb stable at 11.6  INFECTIOUS  ? Aspiration pneumonia in the setting of her recent surgery. Patient will  Have sputum ciuulture and procal. She willl be started on Zosyn empirically       Micheal Likens MD Pulmonary and [place] Pager: [phone] Cell [phone]  04/11/2018, 8:19 AM

## 2018-04-11 NOTE — Plan of Care (Signed)

## 2018-04-11 NOTE — Progress Notes (Signed)
3 Days Post-Op Procedure(s) (LRB): REDO LEFT VIDEO ASSISTED THORACOSCOPY with left lower lobe wedge resection (Left) Subjective: No complaints this AM  Objective: Vital signs in last 24 hours: Temp:  [97.8 F (36.6 C)-98.4 F (36.9 C)] 98.1 F (36.7 C) (07/27 0755) Pulse Rate:  [75-87] 75 (07/27 0722) Cardiac Rhythm: Normal sinus rhythm (07/26 2009) Resp:  [13-39] 18 (07/27 0722) BP: (103-134)/(64-84) 105/65 (07/27 0722) SpO2:  [89 %-100 %] 98 % (07/27 0729)  Hemodynamic parameters for last 24 hours:    Intake/Output from previous day: 07/26 0701 - 07/27 0700 In: 407.5 [P.O.:120; IV Piggyback:287.5] Out: 4305 [Urine:3900; Chest Tube:405] Intake/Output this shift: No intake/output data recorded.  General appearance: alert, cooperative and no distress Neurologic: intact Heart: regular rate and rhythm Lungs: diminished breath sounds on left but improved from yesterday Abdomen: normal findings: soft, non-tender + air leak  Lab Results: Recent Labs    04/09/18 0546 04/10/18 0407  WBC 13.5* 12.2*  HGB 11.4* 11.6*  HCT 35.5* 35.5*  PLT 217 226   BMET:  Recent Labs    04/10/18 0407 04/11/18 0402  NA 137 138  K 3.5 3.7  CL 99 101  CO2 29 30  GLUCOSE 128* 116*  BUN 11 9  CREATININE 0.64 0.55  CALCIUM 8.1* 8.3*    PT/INR: No results for input(s): LABPROT, INR in the last 72 hours. ABG    Component Value Date/Time   PHART 7.351 04/09/2018 0348   HCO3 24.2 04/09/2018 0348   TCO2 24.5 12/18/2011 0523   ACIDBASEDEF 0.7 04/09/2018 0348   O2SAT 87.6 04/09/2018 0348   CBG (last 3)  No results for input(s): GLUCAP in the last 72 hours.  Assessment/Plan: S/P Procedure(s) (LRB): REDO LEFT VIDEO ASSISTED THORACOSCOPY with left lower lobe wedge resection (Left) -CV- stable  RESP- CXR shows improved aeration in left lung today. Persistent changes at right base  Probable aspiration pneumonia, possibly with a component of pulmonary edema  Continue current pulmonary  Rx  Continue IS, flutter  Will keep CT to suction for now  RENAL- diuresed aggressively for past 48 hours, will hold off on further diuresis  SCD + enoxaparin for DVT prophylaxis  Mobilize as tolerated   LOS: 3 days    [name] 04/11/2018

## 2018-04-11 NOTE — Progress Notes (Signed)
A few mcgs of fentanyl wasted in sink after changing out PCA syringe.

## 2018-04-12 ENCOUNTER — Inpatient Hospital Stay (HOSPITAL_COMMUNITY): Payer: Medicare Other

## 2018-04-12 LAB — BASIC METABOLIC PANEL
Anion gap: 6 (ref 5–15)
BUN: 12 mg/dL (ref 8–23)
CO2: 27 mmol/L (ref 22–32)
Calcium: 8.1 mg/dL — ABNORMAL LOW (ref 8.9–10.3)
Chloride: 103 mmol/L (ref 98–111)
Creatinine, Ser: 0.57 mg/dL (ref 0.44–1.00)
GFR calc Af Amer: >60 mL/min (ref >60)
GFR calc non Af Amer: >60 mL/min (ref >60)
Glucose, Bld: 107 mg/dL — ABNORMAL HIGH (ref 70–99)
Potassium: 4.1 mmol/L (ref 3.5–5.1)
Sodium: 136 mmol/L (ref 135–145)

## 2018-04-12 LAB — CBC
HCT: 36.3 % (ref 36.0–46.0)
Hemoglobin: 11.8 g/dL — ABNORMAL LOW (ref 12.0–15.0)
MCH: 31 pg (ref 26.0–34.0)
MCHC: 32.5 g/dL (ref 30.0–36.0)
MCV: 95.3 fL (ref 78.0–100.0)
Platelets: 285 10*3/uL (ref 150–400)
RBC: 3.81 MIL/uL — ABNORMAL LOW (ref 3.87–5.11)
RDW: 13.7 % (ref 11.5–15.5)
WBC: 12.7 10*3/uL — ABNORMAL HIGH (ref 4.0–10.5)

## 2018-04-12 LAB — CULTURE, RESPIRATORY W GRAM STAIN: Culture: NORMAL

## 2018-04-12 MED ORDER — FUROSEMIDE 10 MG/ML IJ SOLN
20.0000 mg | Freq: Once | INTRAMUSCULAR | Status: AC
  Administered 2018-04-12: 20 mg via INTRAVENOUS
  Filled 2018-04-12: qty 2

## 2018-04-12 NOTE — Consult Note (Signed)
PULMONARY / CRITICAL CARE MEDICINE   Name: Valerie Mosley MRN: [id number] DOB: 1951/06/10    ADMISSION DATE:  04/08/2018 CONSULTATION DATE:  04/09/18  REFERRING MD:Dr. hendrickson  CHIEF COMPLAINT:  Dyspnea? 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  She has been followed with serial CTs since her resection in 2013.  She recently had her annual CT.  It showed an increase in the size of a left apical nodule.  This was previously thought to be scarring.  PET/CT showed the area was hypermetabolic.  She had a CT-guided needle biopsy on 03/06/2018.  That was uncomplicated.  She has occasional wheezing requiring albuterol.  She is taking Singulair.  She gets short of breath with heavy exertion.  She is not having chest pain, pressure, or tightness.  She has not had any unusual headaches or visual changes.  Her appetite is good and her weight is stable.  She does still work.  The patient underwent VATS yestetrday with a limited LLL wedge resection. I was asked to see the patient today. She is having increased breahtlessness and requiring HFNC. Her new CXR suggest a RUL and RLL infiltrates. The patient hads been afebbrile. Her WBC is uop  A bit [facility], she did have surgery yesterday). Starting to bring up some phlem. The patient had a PFT done 7/222 showing moderate obstructive lung defect with some reversibility and a failrly low diffusion capacity.  7/26 The patient is up in the chair and looks clinically well. Believes her breathing is a little easier today. She remains on the HFNC Vitals are stable.  7/27 The patient appears fairly comfortable. Had a quiet night. overla it asppears that her respiratory status has improved. There remains what appears to be some atelectasis at the right base. There is a  small apical PTX on the left. There is apersistent air leak in the Chest tube.  7/28 The patient looks well. No major complainta. Sitting up in the chair. Conitnues to have aor leak although it apopears to be a little less than it had been. Remains on high flow 02. CXR is eesentially unchanged. Her appetite ois good. Ske is doing laps around theunit with rolloing walker and assistance     PAST MEDICAL HISTORY :  She  has a past medical history of Cancer ([place]), COPD (chronic obstructive pulmonary disease) ([place]), Cough (07/29/11), Cough, Hypertension, Kyphoscoliosis, PONV (postoperative nausea and vomiting), and Recurrent upper respiratory infection (URI).  PAST SURGICAL HISTORY: She  has a past surgical history that includes Urethra surgery; Thyroid surgery (1980); Bronchoscopy; Lobectomy (12/17/2011); and Video assisted thoracoscopy (Left, 04/08/2018).  No Known Allergies  No current facility-administered medications on file prior to encounter.    Current Outpatient Medications on File Prior to Encounter  Medication Sig  . acetaminophen (TYLENOL) 500 MG tablet Take 1,000 mg by mouth every 6 (six) hours as needed for moderate pain or headache. For pain or fever  . albuterol (VENTOLIN HFA) 108 (90 Base) MCG/ACT inhaler Inhale 2 puffs into the lungs every 4 (four) hours as needed for wheezing or shortness of breath. For shortness of breath  . alendronate (FOSAMAX) 70 MG tablet Take 70 mg by mouth once a week. Take on Friday with a full glass of water on an empty stomach.  Marland Kitchen amLODipine (NORVASC) 10 MG tablet Take 10 mg  by mouth daily.    . colestipol (COLESTID) 1 g tablet Take 1 g by mouth daily.  . fluticasone (FLONASE) 50 MCG/ACT nasal spray instill 2 sprays into each nostril once daily (Patient taking differently: instill 2 sprays into each nostril once daily as needed for allergies)  . montelukast (SINGULAIR) 10 MG tablet Take 1 tablet (10 mg total) by mouth at bedtime.  . sertraline  (ZOLOFT) 50 MG tablet Take 50 mg by mouth at bedtime.  Marland Kitchen tiotropium (SPIRIVA HANDIHALER) 18 MCG inhalation capsule inhale the contents of one capsule in the handihaler once daily (Patient taking differently: Place 18 mcg into inhaler and inhale daily. )  . Vitamin D, Ergocalciferol, (DRISDOL) 50000 units CAPS capsule Take 50,000 Units by mouth 2 (two) times a week. Tuesdays and Fridays  . acyclovir ointment (ZOVIRAX) 5 % Apply 1 application topically every 3 (three) hours. (Patient not taking: Reported on 04/03/2018)    FAMILY HISTORY:  Her family history is not on file.  SOCIAL HISTORY: She  reports that she quit smoking about 6 years ago. Her smoking use included cigarettes. She has a 20.00 pack-year smoking history. She has never used smokeless tobacco. She reports that she drinks alcohol. She reports that she does not use drugs.  REVIEW OF SYSTEMS:   Review of Systems  Constitutional: Negative for chills, fever and malaise/fatigue.  HENT: Negative.   Eyes: Negative.   Respiratory: Positive for cough, sputum production, shortness of breath and wheezing. Negative for hemoptysis.   Cardiovascular: Negative for chest pain and palpitations.  Gastrointestinal: Negative.   Genitourinary: Negative.   Musculoskeletal: Negative.   Skin: Negative for rash.  Neurological: Negative.   Endo/Heme/Allergies: Negative.   Psychiatric/Behavioral: Negative.         VITAL SIGNS: BP 121/77   Pulse 87   Temp 98.4 F (36.9 C) (Oral)   Resp 18   Ht 5\' 4"  (1.626 m)   Wt 124 lb (56.2 kg)   SpO2 95%   BMI 21.28 kg/m       INTAKE / OUTPUT: I/O last 3 completed shifts: In: 347.5 [I.V.:110; IV Piggyback:237.5] Out: 2955 [Urine:2650; Chest Tube:305]  PHYSICAL EXAMINATION: General:  Patient does not appear to be in distress. Looks a bit older than stated age. Neuro: Alert and oriented. Moving all  Extr.As noted she is transferring and ambulating HEENT:  [place]/AT, sclerae anicteric Cardiovascular:   RRR, S1S2 Lungs:Bilateral BS. Decreased BS left lower chest and right lower chest. Chest tube with persisitent air leak on inspiration. Back mild kyphosis Abdomen:  Soft, BS, non-tender  Extr: s c/c/e    LABS:  BMET Recent Labs  Lab 04/10/18 0407 04/11/18 0402 04/12/18 0327  NA 137 138 136  K 3.5 3.7 4.1  CL 99 101 103  CO2 29 30 27   BUN 11 9 12   CREATININE 0.64 0.55 0.57  GLUCOSE 128* 116* 107*    Electrolytes Recent Labs  Lab 04/10/18 0407 04/11/18 0402 04/12/18 0327  CALCIUM 8.1* 8.3* 8.1*    CBC Recent Labs  Lab 04/09/18 0546 04/10/18 0407 04/12/18 0327  WBC 13.5* 12.2* 12.7*  HGB 11.4* 11.6* 11.8*  HCT 35.5* 35.5* 36.3  PLT 217 226 285    Coag's Recent Labs  Lab 04/06/18 1505  APTT 32  INR 0.96    Sepsis Markers Recent Labs  Lab 04/09/18 1420  PROCALCITON <0.10    ABG Recent Labs  Lab 04/06/18 1415 04/09/18 0348  PHART 7.478* 7.351  PCO2ART 32.3 44.8  PO2ART 67.5*  53.7*    Liver Enzymes Recent Labs  Lab 04/06/18 1505 04/10/18 0407  AST 21 18  ALT 20 13  ALKPHOS 141* 82  BILITOT 0.5 0.7  ALBUMIN 3.7 2.9*    C Imaging Dg Chest Port 1 View  Result Date: 04/12/2018 CLINICAL DATA:  Status post left upper lobectomy and left lower lobe wedge resection EXAM: PORTABLE CHEST 1 VIEW COMPARISON:  Chest radiograph from one day prior. FINDINGS: Stable left apical chest tube position. Right internal jugular central venous catheter terminates in the lower third of the SVC. Stable cardiomediastinal silhouette with mild cardiomegaly. Stable small left apical pneumothorax. No right pneumothorax. Stable volume loss in the left hemithorax with suture line in the left upper hemithorax. Stable streaky and hazy lung opacities throughout both lungs. No pleural effusions. IMPRESSION: 1. Stable small left apical pneumothorax with stable left chest tube position. 2. Stable postsurgical changes in the left hemithorax. Stable hazy and streaky lung  opacities throughout both lungs. 3. Stable mild cardiomegaly. Electronically Signed   By: Ilona Sorrel M.D.   On: 04/12/2018 07:17        DISCUSSION: Patient is increasingly short of breath and hypoxemic. There is a new change on her CXR suggesting a new RUL infiltrate and RLL atelctasis vs. Pneumonia. The patient appears to be getting a little better incrementally.  ASSESSMENT / PLAN:  PULMONARY  patient on HFNC. If she experiencels worsening breahtlessness or hypoxemia or wob increases she may need mechanical ventilaltion. I will request sputum culure. I am starting the patient on Zosyn for possible aspiration syndrome. I suggest an expectorant. Will start [place] therapy. Had a slightly elevated PaCO2 today but close to normal pH and she is quitte alert.  7/26 The patient had improvement in the RUL density. Has increased atelectasis in the left lung with some further loss of volume. She is getting expectorants, duoneb and metaneb therapy. I put her  On Zosyn empirically. If for some reason the CXR loks increasingly worse or her 02 needs rise she may need FOB 7/27 Small apical PTX about 2 cm. perisistent air leak left CT. The left lung has progressively expanded from earlier. perisistent atelectasis at the right base. The patient is on day of Zosyn. Gram stain showed many WBcs but no organisms 7/28  Chest tube placed oion air seal. May be helpful to repat CXR on air seal  CARDIOVASCULAR No history of caridiovascular disease other than HTN. No recent ECHO in the chart. EKG showed LAE, IRBBB, NSR and occasional APCs.       HEMATOLOGIC Platelets and Hb stable.  INFECTIOUS  ? Aspiration pneumonia in the setting of her recent surgery. Patient will  Have sputum ciuulture and procal. She willl be started on Zosyn empirically.       Micheal Likens MD Pulmonary and [place] Pager: [phone] Cell [phone]  04/12/2018, 9:09  AM

## 2018-04-12 NOTE — Progress Notes (Signed)
      [address]       [place] [address]             [phone]      Had some pain and SOB earlier- improved when CT placed back to suction Has a small air leak BP 116/74   Pulse (!) 101   Temp 97.8 F (36.6 C) (Oral)   Resp (!) 26   Ht 5\' 4"  (1.626 m)   Wt 124 lb (56.2 kg)   SpO2 (!) 89%   BMI 21.28 kg/m   Intake/Output Summary (Last 24 hours) at 04/12/2018 1810 Last data filed at 04/12/2018 1200 Gross per 24 hour  Intake 480 ml  Output 1800 ml  Net -1320 ml   Will keep CT to suction for now Down to [place]. Roxan Hockey, MD [facility] [phone]

## 2018-04-12 NOTE — Progress Notes (Signed)
4 Days Post-Op Procedure(s) (LRB): REDO LEFT VIDEO ASSISTED THORACOSCOPY with left lower lobe wedge resection (Left) Subjective: No complaints currently She is not sure why O2 increased, says sat decreased to 80 with coughing spell. No indication in records.  Objective: Vital signs in last 24 hours: Temp:  [98.3 F (36.8 C)-98.7 F (37.1 C)] 98.4 F (36.9 C) (07/28 0757) Pulse Rate:  [82-100] 87 (07/28 0718) Cardiac Rhythm: Normal sinus rhythm (07/28 0400) Resp:  [0-33] 18 (07/28 0718) BP: (100-129)/(64-82) 121/77 (07/28 0718) SpO2:  [84 %-98 %] 95 % (07/28 0722)  Hemodynamic parameters for last 24 hours:    Intake/Output from previous day: 07/27 0701 - 07/28 0700 In: 197.5 [I.V.:110; IV Piggyback:87.5] Out: 1615 [Urine:1425; Chest Tube:190] Intake/Output this shift: No intake/output data recorded.  General appearance: alert, cooperative and no distress Neurologic: intact Heart: regular rate and rhythm Lungs: diminished breath sounds on left Wound: clean and dryt + air leak  Lab Results: Recent Labs    04/10/18 0407 04/12/18 0327  WBC 12.2* 12.7*  HGB 11.6* 11.8*  HCT 35.5* 36.3  PLT 226 285   BMET:  Recent Labs    04/11/18 0402 04/12/18 0327  NA 138 136  K 3.7 4.1  CL 101 103  CO2 30 27  GLUCOSE 116* 107*  BUN 9 12  CREATININE 0.55 0.57  CALCIUM 8.3* 8.1*    PT/INR: No results for input(s): LABPROT, INR in the last 72 hours. ABG    Component Value Date/Time   PHART 7.351 04/09/2018 0348   HCO3 24.2 04/09/2018 0348   TCO2 24.5 12/18/2011 0523   ACIDBASEDEF 0.7 04/09/2018 0348   O2SAT 87.6 04/09/2018 0348   CBG (last 3)  No results for input(s): GLUCAP in the last 72 hours.  Assessment/Plan: S/P Procedure(s) (LRB): REDO LEFT VIDEO ASSISTED THORACOSCOPY with left lower lobe wedge resection (Left) -CV- stable  RESP- weaned O2 to 2 L yesterday then turned back up this AM. She is not in any distress and does not feel dyspneic anymore than usual.  Wean O2 as tolerated  RENAL- creatinine and lytes OK  ID- on zosyn for presumed aspiration pneumonia  Continue ambulation   LOS: 4 days    [name] 04/12/2018

## 2018-04-12 NOTE — Progress Notes (Signed)
Pt c/o left sided surgical site pain and SOB. Pt states that  "pain has been there for about an hour". Plura-vac reconnected to -63mmHg suction. Pain resolved. CCM contacted to relay event. Maintain suction to Plura-vac until evaluation by CVTS. Will continue to monitor. VSS at this time.

## 2018-04-13 ENCOUNTER — Inpatient Hospital Stay (HOSPITAL_COMMUNITY): Payer: Medicare Other

## 2018-04-13 DIAGNOSIS — J690 Pneumonitis due to inhalation of food and vomit: Secondary | ICD-10-CM

## 2018-04-13 DIAGNOSIS — C3432 Malignant neoplasm of lower lobe, left bronchus or lung: Principal | ICD-10-CM

## 2018-04-13 DIAGNOSIS — J441 Chronic obstructive pulmonary disease with (acute) exacerbation: Secondary | ICD-10-CM

## 2018-04-13 DIAGNOSIS — R918 Other nonspecific abnormal finding of lung field: Secondary | ICD-10-CM

## 2018-04-13 LAB — BASIC METABOLIC PANEL
Anion gap: 6 (ref 5–15)
BUN: 12 mg/dL (ref 8–23)
CO2: 30 mmol/L (ref 22–32)
Calcium: 8 mg/dL — ABNORMAL LOW (ref 8.9–10.3)
Chloride: 101 mmol/L (ref 98–111)
Creatinine, Ser: 0.64 mg/dL (ref 0.44–1.00)
GFR calc Af Amer: >60 mL/min (ref >60)
GFR calc non Af Amer: >60 mL/min (ref >60)
Glucose, Bld: 108 mg/dL — ABNORMAL HIGH (ref 70–99)
Potassium: 3.5 mmol/L (ref 3.5–5.1)
Sodium: 137 mmol/L (ref 135–145)

## 2018-04-13 LAB — CBC
HCT: 36.2 % (ref 36.0–46.0)
Hemoglobin: 11.8 g/dL — ABNORMAL LOW (ref 12.0–15.0)
MCH: 30.9 pg (ref 26.0–34.0)
MCHC: 32.6 g/dL (ref 30.0–36.0)
MCV: 94.8 fL (ref 78.0–100.0)
Platelets: 292 10*3/uL (ref 150–400)
RBC: 3.82 MIL/uL — ABNORMAL LOW (ref 3.87–5.11)
RDW: 13.7 % (ref 11.5–15.5)
WBC: 11.2 10*3/uL — ABNORMAL HIGH (ref 4.0–10.5)

## 2018-04-13 MED ORDER — POTASSIUM CHLORIDE CRYS ER 20 MEQ PO TBCR
40.0000 meq | EXTENDED_RELEASE_TABLET | Freq: Once | ORAL | Status: AC
Start: 2018-04-13 — End: 2018-04-13
  Administered 2018-04-13: 40 meq via ORAL
  Filled 2018-04-13: qty 2

## 2018-04-13 MED ORDER — PREDNISONE 20 MG PO TABS
40.0000 mg | ORAL_TABLET | Freq: Every day | ORAL | Status: DC
  Administered 2018-04-14 – 2018-04-24 (×11): 40 mg via ORAL
  Filled 2018-04-13 (×11): qty 2

## 2018-04-13 NOTE — Progress Notes (Signed)
PULMONARY / CRITICAL CARE MEDICINE   Name: Valerie Mosley MRN: [id number] DOB: 03/06/1951    ADMISSION DATE:  04/08/2018 CONSULTATION DATE:  04/09/18  REFERRING MD:Dr. hendrickson  CHIEF COMPLAINT:  Dyspnea? 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  She has been followed with serial CTs since her resection in 2013.  She recently had her annual CT.  It showed an increase in the size of a left apical nodule.  This was previously thought to be scarring.  PET/CT showed the area was hypermetabolic.  She had a CT-guided needle biopsy on 03/06/2018.  That was uncomplicated and demonstrated adenocarcinoma. She has occasional wheezing requiring albuterol. She is taking Singulair.  She gets short of breath with heavy exertion.  She is not having chest pain, pressure, or tightness.  She has not had any unusual headaches or visual changes.  Her appetite is good and her weight is stable.  The patient underwent VATS with a limited LLL wedge resection 7/24. Pulmonary consulted for  increased breahtlessness and requiring HFNC in the post op setting. Her CXR was suggestive of RUL and RLL infiltrates. This was felt to potentially demonstrate aspiration pneumonia and she was started on Zosyn.   Course complicated by ongoing air leak from chest tube.     VITAL SIGNS: BP 98/63   Pulse (!) 101   Temp 98.6 F (37 C) (Oral)   Resp (!) 24   Ht 5\' 4"  (1.626 m)   Wt 56 kg (123 lb 7.3 oz)   SpO2 95%   BMI 21.19 kg/m     INTAKE / OUTPUT: I/O last 3 completed shifts: In: 10 [P.O.:720; I.V.:20; IV Piggyback:150] Out: 3100 [Urine:2800; Chest Tube:300]  PHYSICAL EXAMINATION: General:  Elderly appearing female in NAD Neuro:Alert, oriented, non-focal HEENT:  [place]/AT, PERRL, no appreciable JVD Cardiovascular:  RRR, S1S2 Lungs:  Decreased L base.  Abdomen:  Soft, BS, non-tender.  LABS:  BMET Recent Labs  Lab 04/11/18 0402 04/12/18 0327 04/13/18 0403  NA 138 136 137  K 3.7 4.1 3.5  CL 101 103 101  CO2 30 27 30   BUN 9 12 12   CREATININE 0.55 0.57 0.64  GLUCOSE 116* 107* 108*    Electrolytes Recent Labs  Lab 04/11/18 0402 04/12/18 0327 04/13/18 0403  CALCIUM 8.3* 8.1* 8.0*    CBC Recent Labs  Lab 04/10/18 0407 04/12/18 0327 04/13/18 0403  WBC 12.2* 12.7* 11.2*  HGB 11.6* 11.8* 11.8*  HCT 35.5* 36.3 36.2  PLT 226 285 292    Coag's Recent Labs  Lab 04/06/18 1505  APTT 32  INR 0.96    Sepsis Markers Recent Labs  Lab 04/09/18 1420  PROCALCITON <0.10    ABG Recent Labs  Lab 04/06/18 1415 04/09/18 0348  PHART 7.478* 7.351  PCO2ART 32.3 44.8  PO2ART 67.5* 53.7*    Liver Enzymes Recent Labs  Lab 04/06/18 1505 04/10/18 0407  AST 21 18  ALT 20 13  ALKPHOS 141* 82  BILITOT 0.5 0.7  ALBUMIN 3.7 2.9*    C Imaging Dg Chest Port 1 View  Result Date: 04/13/2018 CLINICAL DATA:  Status postleft lower lobe wedge resection on July 24th, 2019. History of previous left upper lobectomy in 2013. Follow-up pneumothorax with chest tube placement. EXAM: PORTABLE CHEST 1 VIEW COMPARISON:  Portable chest x-ray of April 12, 2018. FINDINGS: There remains  a small left apical pneumothorax. The tip of the tube lies in the interspace between the third and fourth ribs. There is a small amount of subcutaneous emphysema on the left. There is mild shift of the mediastinum toward the left which appears stable. The interstitial markings of both lungs remain increased greatest on the left. The heart is top-normal in size. The pulmonary vascularity is normal. The right internal jugular venous catheter tip projects over the junction of the middle and distal thirds of the SVC. There is calcification in the wall of the aortic arch. The observed bony structures exhibit no acute abnormalities. IMPRESSION: Stable  small left apical pneumothorax. Stable positioning of the chest tube. Stable to slightly improved appearance of the interstitial markings in the left mid and lower lung. Mild stable enlargement of the cardiac silhouette with minimal interstitial edema. Thoracic aortic atherosclerosis. Electronically Signed   By: David  Martinique M.D.   On: 04/13/2018 07:25     The patient had a PFT done 7/22 showing moderate obstructive lung defect with some reversibility and a failrly low diffusion capacity.  DISCUSSION: Patient is increasingly short of breath and hypoxemic. There is a new change on her CXR suggesting a new RUL infiltrate and RLL atelctasis vs. Pneumonia. She was treated with high flow O2 and ABX and has improved.   ASSESSMENT / PLAN:  Acute hypoxemic respiratory failure: etiology not entirely clear. Ddx includes post op ATX and pneumonia. Also has a small L apical PTX on side of surgery. Chest tube in place with moderate air leak. Supplemental O2 now down to 2L [place].  - Now s/p 5 day course of Zosyn with negative PCT and WBC improved. DC ABX - Continue aggressive incentive spirometry - Pain management with PCA analgesia - Mobilize.  - Chest tube per CVTS.   COPD with possible acute exacerbation. Improved. - D/C solumedrol and transition to prednisone 40 mg taper over 2 weeks.  - Continue albuterol, budesonide, Singulair. Metaneb chest PT.   Georgann Housekeeper, AGACNP-BC [facility] Pulmonology/Critical Care Pager [phone] or [phone]  04/13/2018 1:23 PM

## 2018-04-13 NOTE — Progress Notes (Signed)
5 Days Post-Op Procedure(s) (LRB): REDO LEFT VIDEO ASSISTED THORACOSCOPY with left lower lobe wedge resection (Left) Subjective: Feels better this morning  Objective: Vital signs in last 24 hours: Temp:  [97.8 F (36.6 C)-98.4 F (36.9 C)] 97.9 F (36.6 C) (07/29 0755) Pulse Rate:  [101] 101 (07/28 1338) Cardiac Rhythm: Normal sinus rhythm (07/29 0500) Resp:  [13-30] 22 (07/29 0700) BP: (101-132)/(58-83) 119/69 (07/29 0700) SpO2:  [89 %-97 %] 97 % (07/29 0700) FiO2 (%):  [21 %] 21 % (07/28 1247) Weight:  [123 lb 7.3 oz (56 kg)] 123 lb 7.3 oz (56 kg) (07/29 0500)  Hemodynamic parameters for last 24 hours:    Intake/Output from previous day: 07/28 0701 - 07/29 0700 In: 840 [P.O.:720; I.V.:20; IV Piggyback:100] Out: 2350 [Urine:2100; Chest Tube:250] Intake/Output this shift: No intake/output data recorded.  General appearance: alert, cooperative and no distress Neurologic: intact Heart: regular rate and rhythm Lungs: rhonchi on leftr + air leak  Lab Results: Recent Labs    04/12/18 0327 04/13/18 0403  WBC 12.7* 11.2*  HGB 11.8* 11.8*  HCT 36.3 36.2  PLT 285 292   BMET:  Recent Labs    04/12/18 0327 04/13/18 0403  NA 136 137  K 4.1 3.5  CL 103 101  CO2 27 30  GLUCOSE 107* 108*  BUN 12 12  CREATININE 0.57 0.64  CALCIUM 8.1* 8.0*    PT/INR: No results for input(s): LABPROT, INR in the last 72 hours. ABG    Component Value Date/Time   PHART 7.351 04/09/2018 0348   HCO3 24.2 04/09/2018 0348   TCO2 24.5 12/18/2011 0523   ACIDBASEDEF 0.7 04/09/2018 0348   O2SAT 87.6 04/09/2018 0348   CBG (last 3)  No results for input(s): GLUCAP in the last 72 hours.  Assessment/Plan: S/P Procedure(s) (LRB): REDO LEFT VIDEO ASSISTED THORACOSCOPY with left lower lobe wedge resection (Left) Plan for transfer to step-down: see transfer orders  Looks better this AM Still has air leak- did not tolerate water seal yesterday, keep on suction today Continue IS, flutter Day  5 of Zosyn for presumed aspiration pneumonia Continue ambulation   LOS: 5 days    [name] 04/13/2018

## 2018-04-13 NOTE — Progress Notes (Signed)
Patient transferred to 2C08

## 2018-04-14 ENCOUNTER — Inpatient Hospital Stay (HOSPITAL_COMMUNITY): Payer: Medicare Other

## 2018-04-14 LAB — BASIC METABOLIC PANEL
Anion gap: 8 (ref 5–15)
BUN: 22 mg/dL (ref 8–23)
CO2: 27 mmol/L (ref 22–32)
Calcium: 8.4 mg/dL — ABNORMAL LOW (ref 8.9–10.3)
Chloride: 102 mmol/L (ref 98–111)
Creatinine, Ser: 0.69 mg/dL (ref 0.44–1.00)
GFR calc Af Amer: >60 mL/min (ref >60)
GFR calc non Af Amer: >60 mL/min (ref >60)
Glucose, Bld: 90 mg/dL (ref 70–99)
Potassium: 3.9 mmol/L (ref 3.5–5.1)
Sodium: 137 mmol/L (ref 135–145)

## 2018-04-14 MED ORDER — ACETAMINOPHEN 325 MG PO TABS
650.0000 mg | ORAL_TABLET | Freq: Four times a day (QID) | ORAL | Status: DC | PRN
  Administered 2018-04-14 – 2018-04-23 (×12): 650 mg via ORAL
  Filled 2018-04-14 (×12): qty 2

## 2018-04-14 NOTE — Progress Notes (Signed)
      [address]       [place] [address]             [phone]      6 Days Post-Op Procedure(s) (LRB): REDO LEFT VIDEO ASSISTED THORACOSCOPY with left lower lobe wedge resection (Left) Subjective: She is having some incisional pain this morning and pain around her chest tube. Asking to add back her tylenol.   Objective: Vital signs in last 24 hours: Temp:  [98 F (36.7 C)-98.6 F (37 C)] 98 F (36.7 C) (07/30 0346) Pulse Rate:  [78-86] 78 (07/30 0731) Cardiac Rhythm: Normal sinus rhythm (07/30 0358) Resp:  [17-34] 23 (07/30 0731) BP: (98-117)/(63-84) 106/75 (07/29 2316) SpO2:  [88 %-98 %] 94 % (07/30 0740) FiO2 (%):  [32 %] 32 % (07/30 0740)     Intake/Output from previous day: 07/29 0701 - 07/30 0700 In: 220 [I.V.:170; IV Piggyback:50] Out: 270 [Chest Tube:270] Intake/Output this shift: No intake/output data recorded.  General appearance: alert, cooperative and no distress Heart: regular rate and rhythm, S1, S2 normal, no murmur, click, rub or gallop Lungs: crackles bilaterally and in all fields Abdomen: soft, non-tender; bowel sounds normal; no masses,  no organomegaly Extremities: extremities normal, atraumatic, no cyanosis or edema Wound: clean and dry  Lab Results: Recent Labs    04/12/18 0327 04/13/18 0403  WBC 12.7* 11.2*  HGB 11.8* 11.8*  HCT 36.3 36.2  PLT 285 292   BMET:  Recent Labs    04/13/18 0403 04/14/18 0429  NA 137 137  K 3.5 3.9  CL 101 102  CO2 30 27  GLUCOSE 108* 90  BUN 12 22  CREATININE 0.64 0.69  CALCIUM 8.0* 8.4*    PT/INR: No results for input(s): LABPROT, INR in the last 72 hours. ABG    Component Value Date/Time   PHART 7.351 04/09/2018 0348   HCO3 24.2 04/09/2018 0348   TCO2 24.5 12/18/2011 0523   ACIDBASEDEF 0.7 04/09/2018 0348   O2SAT 87.6 04/09/2018 0348   CBG (last 3)  No results for input(s): GLUCAP in the last 72 hours.  Assessment/Plan: S/P Procedure(s) (LRB): REDO LEFT VIDEO  ASSISTED THORACOSCOPY with left lower lobe wedge resection (Left)  1. CV-NSR in the 2s. BP well controlled. Continue Norvasc at home dose.  2. Pulm-CXR this morning appears stable when compared to yesterday study, will await official read. Tolerating 3L [place] with good oxygen saturation. Continue to wean as tolerated. Chest tube output 270cc/24 hours, ++ air leak with and without cough.  3. Renal-creatinine 0.54, electrolytes okay.  4. H and H has been stable, platelets 292k 5. Endo-blood glucose level well controlled  Plan: Continue chest tube on suction due to air leak. Continue to work on pain control.    LOS: 6 days    Elgie Collard 04/14/2018

## 2018-04-15 ENCOUNTER — Inpatient Hospital Stay (HOSPITAL_COMMUNITY): Payer: Medicare Other

## 2018-04-15 MED ORDER — CHLORHEXIDINE GLUCONATE CLOTH 2 % EX PADS
6.0000 | MEDICATED_PAD | Freq: Every day | CUTANEOUS | Status: DC
  Administered 2018-04-16: 6 via TOPICAL

## 2018-04-15 MED ORDER — GERHARDT'S BUTT CREAM
TOPICAL_CREAM | CUTANEOUS | Status: DC | PRN
  Filled 2018-04-15: qty 1

## 2018-04-15 MED ORDER — BISACODYL 5 MG PO TBEC
10.0000 mg | DELAYED_RELEASE_TABLET | Freq: Every day | ORAL | Status: DC | PRN
  Administered 2018-04-22: 5 mg via ORAL
  Filled 2018-04-15: qty 2

## 2018-04-15 NOTE — Progress Notes (Signed)
Wasted 3 ml fentanyl from PCA syringe with Janett Billow, Therapist, sports.  Tressie Ellis, RN

## 2018-04-15 NOTE — H&P (View-Only) (Signed)
      [address]       [place] [address]             [phone]      7 Days Post-Op Procedure(s) (LRB): REDO LEFT VIDEO ASSISTED THORACOSCOPY with left lower lobe wedge resection (Left) Subjective: No issues with the chest tube since yesterday. She feels like her throat is a little sore this morning.   Objective: Vital signs in last 24 hours: Temp:  [98.3 F (36.8 C)-99 F (37.2 C)] 98.5 F (36.9 C) (07/31 0802) Pulse Rate:  [67-121] 67 (07/31 0802) Cardiac Rhythm: Normal sinus rhythm (07/31 0705) Resp:  [16-29] 24 (07/31 0802) BP: (97-153)/(67-87) 97/68 (07/31 0802) SpO2:  [90 %-97 %] 96 % (07/31 0832) FiO2 (%):  [36 %] 36 % (07/30 1433)     Intake/Output from previous day: 07/30 0701 - 07/31 0700 In: 184.4 [I.V.:184.4] Out: 1630 [Urine:1200; Chest Tube:430] Intake/Output this shift: No intake/output data recorded.  General appearance: alert, cooperative and no distress Heart: regular rate and rhythm, S1, S2 normal, no murmur, click, rub or gallop Lungs: clear to auscultation bilaterally and subcutaneous air throughout entire left side extending from the neck, down the arm, and into her back.  Abdomen: soft, non-tender; bowel sounds normal; no masses,  no organomegaly Extremities: extremities normal, atraumatic, no cyanosis or edema Wound: clean and dry  Lab Results: Recent Labs    04/13/18 0403  WBC 11.2*  HGB 11.8*  HCT 36.2  PLT 292   BMET:  Recent Labs    04/13/18 0403 04/14/18 0429  NA 137 137  K 3.5 3.9  CL 101 102  CO2 30 27  GLUCOSE 108* 90  BUN 12 22  CREATININE 0.64 0.69  CALCIUM 8.0* 8.4*    PT/INR: No results for input(s): LABPROT, INR in the last 72 hours. ABG    Component Value Date/Time   PHART 7.351 04/09/2018 0348   HCO3 24.2 04/09/2018 0348   TCO2 24.5 12/18/2011 0523   ACIDBASEDEF 0.7 04/09/2018 0348   O2SAT 87.6 04/09/2018 0348   CBG (last 3)  No results for input(s): GLUCAP in the last 72  hours.  Assessment/Plan: S/P Procedure(s) (LRB): REDO LEFT VIDEO ASSISTED THORACOSCOPY with left lower lobe wedge resection (Left)   1. CV-NSR in the 24s. BP well controlled. Continue Norvasc at home dose.  2. Pulm-CXR this morning pending. Tolerating 4L  with good oxygen saturation. Continue to wean as tolerated. Chest tube output 430cc/24 hours, ++ air leak with and without cough. Extensive subq air throughout left side.  3. Renal-creatinine 0.69, electrolytes okay.  4. H and H has been stable, platelets 292k-no new labs  Plan: Continue chest tube to suction. Continue ambulation as tolerated. Encourage use of incentive spirometer.     LOS: 7 days    Elgie Collard 04/15/2018 Patient seen and examined, agree with above She still has a sizable air leak with no significant improvement over the past few days Discussed placement of IBV with her. I informed her of the indications, risks, benefits and alternatives. She accepts the risks and wishes to proceed. Will schedule for later this week.  Revonda Standard Roxan Hockey, MD [facility] [phone]

## 2018-04-15 NOTE — Progress Notes (Addendum)
      [address]       [place] [address]             [phone]      7 Days Post-Op Procedure(s) (LRB): REDO LEFT VIDEO ASSISTED THORACOSCOPY with left lower lobe wedge resection (Left) Subjective: No issues with the chest tube since yesterday. She feels like her throat is a little sore this morning.   Objective: Vital signs in last 24 hours: Temp:  [98.3 F (36.8 C)-99 F (37.2 C)] 98.5 F (36.9 C) (07/31 0802) Pulse Rate:  [67-121] 67 (07/31 0802) Cardiac Rhythm: Normal sinus rhythm (07/31 0705) Resp:  [16-29] 24 (07/31 0802) BP: (97-153)/(67-87) 97/68 (07/31 0802) SpO2:  [90 %-97 %] 96 % (07/31 0832) FiO2 (%):  [36 %] 36 % (07/30 1433)     Intake/Output from previous day: 07/30 0701 - 07/31 0700 In: 184.4 [I.V.:184.4] Out: 1630 [Urine:1200; Chest Tube:430] Intake/Output this shift: No intake/output data recorded.  General appearance: alert, cooperative and no distress Heart: regular rate and rhythm, S1, S2 normal, no murmur, click, rub or gallop Lungs: clear to auscultation bilaterally and subcutaneous air throughout entire left side extending from the neck, down the arm, and into her back.  Abdomen: soft, non-tender; bowel sounds normal; no masses,  no organomegaly Extremities: extremities normal, atraumatic, no cyanosis or edema Wound: clean and dry  Lab Results: Recent Labs    04/13/18 0403  WBC 11.2*  HGB 11.8*  HCT 36.2  PLT 292   BMET:  Recent Labs    04/13/18 0403 04/14/18 0429  NA 137 137  K 3.5 3.9  CL 101 102  CO2 30 27  GLUCOSE 108* 90  BUN 12 22  CREATININE 0.64 0.69  CALCIUM 8.0* 8.4*    PT/INR: No results for input(s): LABPROT, INR in the last 72 hours. ABG    Component Value Date/Time   PHART 7.351 04/09/2018 0348   HCO3 24.2 04/09/2018 0348   TCO2 24.5 12/18/2011 0523   ACIDBASEDEF 0.7 04/09/2018 0348   O2SAT 87.6 04/09/2018 0348   CBG (last 3)  No results for input(s): GLUCAP in the last 72  hours.  Assessment/Plan: S/P Procedure(s) (LRB): REDO LEFT VIDEO ASSISTED THORACOSCOPY with left lower lobe wedge resection (Left)   1. CV-NSR in the 61s. BP well controlled. Continue Norvasc at home dose.  2. Pulm-CXR this morning pending. Tolerating 4L [place] with good oxygen saturation. Continue to wean as tolerated. Chest tube output 430cc/24 hours, ++ air leak with and without cough. Extensive subq air throughout left side.  3. Renal-creatinine 0.69, electrolytes okay.  4. H and H has been stable, platelets 292k-no new labs  Plan: Continue chest tube to suction. Continue ambulation as tolerated. Encourage use of incentive spirometer.     LOS: 7 days    Elgie Collard 04/15/2018 Patient seen and examined, agree with above She still has a sizable air leak with no significant improvement over the past few days Discussed placement of IBV with her. I informed her of the indications, risks, benefits and alternatives. She accepts the risks and wishes to proceed. Will schedule for later this week.  Revonda Standard Roxan Hockey, MD [facility] [phone]

## 2018-04-16 ENCOUNTER — Encounter (HOSPITAL_COMMUNITY): Payer: Self-pay | Admitting: Surgery

## 2018-04-16 ENCOUNTER — Inpatient Hospital Stay (HOSPITAL_COMMUNITY): Payer: Medicare Other | Admitting: Certified Registered Nurse Anesthetist

## 2018-04-16 ENCOUNTER — Other Ambulatory Visit: Payer: Self-pay | Admitting: *Deleted

## 2018-04-16 ENCOUNTER — Encounter (HOSPITAL_COMMUNITY)
Admission: RE | Disposition: A | Discharge status: Home Health | Payer: Self-pay | Source: Home / Self Care | Attending: Thoracic Surgery (Cardiothoracic Vascular Surgery)

## 2018-04-16 DIAGNOSIS — J942 Hemothorax: Secondary | ICD-10-CM

## 2018-04-16 HISTORY — PX: INSERTION OF IBV VALVE: SHX5091

## 2018-04-16 HISTORY — PX: VIDEO BRONCHOSCOPY: SHX5072

## 2018-04-16 SURGERY — INSERTION, INTRABRONCHIAL VALVE, SPIRATION
Anesthesia: General

## 2018-04-16 MED ORDER — MIDAZOLAM HCL 2 MG/2ML IJ SOLN
INTRAMUSCULAR | Status: AC
  Filled 2018-04-16: qty 2

## 2018-04-16 MED ORDER — PHENYLEPHRINE 40 MCG/ML (10ML) SYRINGE FOR IV PUSH (FOR BLOOD PRESSURE SUPPORT)
PREFILLED_SYRINGE | INTRAVENOUS | Status: AC
  Filled 2018-04-16: qty 10

## 2018-04-16 MED ORDER — PROPOFOL 10 MG/ML IV BOLUS
INTRAVENOUS | Status: AC
  Filled 2018-04-16: qty 20

## 2018-04-16 MED ORDER — MIDAZOLAM HCL 2 MG/2ML IJ SOLN
INTRAMUSCULAR | Status: DC | PRN
  Administered 2018-04-16: 2 mg via INTRAVENOUS

## 2018-04-16 MED ORDER — ROCURONIUM BROMIDE 10 MG/ML (PF) SYRINGE
PREFILLED_SYRINGE | INTRAVENOUS | Status: AC
Start: 2018-04-16 — End: ?
  Filled 2018-04-16: qty 10

## 2018-04-16 MED ORDER — ONDANSETRON HCL 4 MG/2ML IJ SOLN: 4.0000 mg | Freq: Once | INTRAMUSCULAR | Status: DC | PRN

## 2018-04-16 MED ORDER — SUGAMMADEX SODIUM 200 MG/2ML IV SOLN
INTRAVENOUS | Status: AC
  Filled 2018-04-16: qty 2

## 2018-04-16 MED ORDER — ROCURONIUM BROMIDE 10 MG/ML (PF) SYRINGE
PREFILLED_SYRINGE | INTRAVENOUS | Status: AC
  Filled 2018-04-16: qty 10

## 2018-04-16 MED ORDER — FENTANYL CITRATE (PF) 100 MCG/2ML IJ SOLN: 25.0000 ug | INTRAMUSCULAR | Status: DC | PRN

## 2018-04-16 MED ORDER — ONDANSETRON HCL 4 MG/2ML IJ SOLN
INTRAMUSCULAR | Status: DC | PRN
  Administered 2018-04-16: 4 mg via INTRAVENOUS

## 2018-04-16 MED ORDER — LIDOCAINE 2% (20 MG/ML) 5 ML SYRINGE
INTRAMUSCULAR | Status: DC | PRN
  Administered 2018-04-16: 100 mg via INTRAVENOUS

## 2018-04-16 MED ORDER — FENTANYL CITRATE (PF) 250 MCG/5ML IJ SOLN
INTRAMUSCULAR | Status: AC
  Filled 2018-04-16: qty 5

## 2018-04-16 MED ORDER — ROCURONIUM BROMIDE 10 MG/ML (PF) SYRINGE
PREFILLED_SYRINGE | INTRAVENOUS | Status: DC | PRN
  Administered 2018-04-16: 20 mg via INTRAVENOUS
  Administered 2018-04-16: 40 mg via INTRAVENOUS

## 2018-04-16 MED ORDER — DEXAMETHASONE SODIUM PHOSPHATE 10 MG/ML IJ SOLN
INTRAMUSCULAR | Status: DC | PRN
  Administered 2018-04-16: 10 mg via INTRAVENOUS

## 2018-04-16 MED ORDER — DEXAMETHASONE SODIUM PHOSPHATE 10 MG/ML IJ SOLN
INTRAMUSCULAR | Status: AC
  Filled 2018-04-16: qty 1

## 2018-04-16 MED ORDER — SUGAMMADEX SODIUM 200 MG/2ML IV SOLN
INTRAVENOUS | Status: DC | PRN
  Administered 2018-04-16: 200 mg via INTRAVENOUS

## 2018-04-16 MED ORDER — ONDANSETRON HCL 4 MG/2ML IJ SOLN
INTRAMUSCULAR | Status: AC
  Filled 2018-04-16: qty 2

## 2018-04-16 MED ORDER — PHENYLEPHRINE HCL 10 MG/ML IJ SOLN
INTRAMUSCULAR | Status: DC | PRN
  Administered 2018-04-16 (×3): 80 ug via INTRAVENOUS

## 2018-04-16 MED ORDER — 0.9 % SODIUM CHLORIDE (POUR BTL) OPTIME
TOPICAL | Status: DC | PRN
  Administered 2018-04-16: 1000 mL

## 2018-04-16 MED ORDER — PROPOFOL 10 MG/ML IV BOLUS
INTRAVENOUS | Status: DC | PRN
  Administered 2018-04-16: 80 mg via INTRAVENOUS

## 2018-04-16 MED ORDER — MENTHOL 3 MG MT LOZG
1.0000 | LOZENGE | OROMUCOSAL | Status: DC | PRN
  Administered 2018-04-16: 3 mg via ORAL
  Filled 2018-04-16 (×2): qty 9

## 2018-04-16 MED ORDER — PHENOL 1.4 % MT LIQD: 1.0000 | OROMUCOSAL | Status: DC | PRN

## 2018-04-16 MED ORDER — FENTANYL CITRATE (PF) 100 MCG/2ML IJ SOLN
INTRAMUSCULAR | Status: DC | PRN
  Administered 2018-04-16 (×5): 50 ug via INTRAVENOUS

## 2018-04-16 MED ORDER — LACTATED RINGERS IV SOLN
INTRAVENOUS | Status: DC
  Administered 2018-04-16: 16:00:00 via INTRAVENOUS

## 2018-04-16 MED ORDER — LIDOCAINE 2% (20 MG/ML) 5 ML SYRINGE
INTRAMUSCULAR | Status: AC
  Filled 2018-04-16: qty 5

## 2018-04-16 SURGICAL SUPPLY — 42 items
BRUSH CYTOL CELLEBRITY 1.5X140 (MISCELLANEOUS) IMPLANT
CANISTER SUCT 3000ML PPV (MISCELLANEOUS) ×3 IMPLANT
CATH BALLN 4FR (CATHETERS) ×1 IMPLANT
CATH EMB 4FR 80CM (CATHETERS) IMPLANT
CATH EMB 5FR 80CM (CATHETERS) ×1 IMPLANT
CATH EMB 6FR 80CM (CATHETERS) IMPLANT
CATH LOADER DEPLOYMENT HUD (CATHETERS) ×1 IMPLANT
CONT SPEC 4OZ CLIKSEAL STRL BL (MISCELLANEOUS) ×6 IMPLANT
COVER BACK TABLE 60X90IN (DRAPES) ×3 IMPLANT
FILTER STRAW FLUID ASPIR (MISCELLANEOUS) IMPLANT
FORCEPS BIOP RJ4 1.8 (CUTTING FORCEPS) ×1 IMPLANT
FORCEPS RADIAL JAW LRG 4 PULM (INSTRUMENTS) IMPLANT
GAUZE SPONGE 4X4 12PLY STRL (GAUZE/BANDAGES/DRESSINGS) ×3 IMPLANT
GLOVE SURG SIGNA 7.5 PF LTX (GLOVE) ×3 IMPLANT
GOWN STRL REUS W/ TWL LRG LVL3 (GOWN DISPOSABLE) ×2 IMPLANT
GOWN STRL REUS W/ TWL XL LVL3 (GOWN DISPOSABLE) ×2 IMPLANT
GOWN STRL REUS W/TWL LRG LVL3 (GOWN DISPOSABLE) ×3
GOWN STRL REUS W/TWL XL LVL3 (GOWN DISPOSABLE) ×3
KIT AIRWAY SIZING HUD (KITS) ×1 IMPLANT
KIT CLEAN ENDO COMPLIANCE (KITS) ×3 IMPLANT
KIT TURNOVER KIT B (KITS) ×3 IMPLANT
MARKER SKIN DUAL TIP RULER LAB (MISCELLANEOUS) ×3 IMPLANT
NS IRRIG 1000ML POUR BTL (IV SOLUTION) ×3 IMPLANT
OIL SILICONE PENTAX (PARTS (SERVICE/REPAIRS)) ×3 IMPLANT
PAD ARMBOARD 7.5X6 YLW CONV (MISCELLANEOUS) ×6 IMPLANT
RADIAL JAW LRG 4 PULMONARY (INSTRUMENTS) ×1
STOPCOCK 4 WAY LG BORE MALE ST (IV SETS) ×1 IMPLANT
STOPCOCK MORSE 400PSI 3WAY (MISCELLANEOUS) ×3 IMPLANT
SYR 10ML LL (SYRINGE) ×3 IMPLANT
SYR 20ML ECCENTRIC (SYRINGE) ×6 IMPLANT
SYR 3ML LL SCALE MARK (SYRINGE) ×1 IMPLANT
SYR 5ML LL (SYRINGE) ×3 IMPLANT
SYR 5ML LUER SLIP (SYRINGE) ×3 IMPLANT
TOWEL GREEN STERILE (TOWEL DISPOSABLE) ×3 IMPLANT
TOWEL GREEN STERILE FF (TOWEL DISPOSABLE) ×3 IMPLANT
TOWEL NATURAL 4PK STERILE (DISPOSABLE) ×3 IMPLANT
TRAP SPECIMEN MUCOUS 40CC (MISCELLANEOUS) ×3 IMPLANT
TUBE CONNECTING 20X1/4 (TUBING) ×3 IMPLANT
UNDERPAD 30X30 (UNDERPADS AND DIAPERS) ×3 IMPLANT
VALVE DISPOSABLE (MISCELLANEOUS) ×3 IMPLANT
VALVE IN CARTRIDGE 7MM HUD (Valve) ×3 IMPLANT
VALVE IN CARTRIDGE 9MM HUD (Valve) ×2 IMPLANT

## 2018-04-16 NOTE — Anesthesia Procedure Notes (Signed)
Procedure Name: Intubation Date/Time: 04/16/2018 4:09 PM Performed by: Jessice Madill T, CRNA Pre-anesthesia Checklist: Patient identified, Emergency Drugs available, Suction available and Patient being monitored Patient Re-evaluated:Patient Re-evaluated prior to induction Oxygen Delivery Method: Circle system utilized Preoxygenation: Pre-oxygenation with 100% oxygen Induction Type: IV induction Ventilation: Mask ventilation without difficulty Laryngoscope Size: Miller and 3 Grade View: Grade I Tube type: Oral Tube size: 9.0 mm Number of attempts: 1 Airway Equipment and Method: Patient positioned with wedge pillow and Stylet Placement Confirmation: ETT inserted through vocal cords under direct vision,  positive ETCO2 and breath sounds checked- equal and bilateral Secured at: 20 cm Tube secured with: Tape Dental Injury: Teeth and Oropharynx as per pre-operative assessment

## 2018-04-16 NOTE — Anesthesia Preprocedure Evaluation (Addendum)
Anesthesia Evaluation  Patient identified by MRN, date of birth, ID band Patient awake    Reviewed: Allergy & Precautions, NPO status , Patient's Chart, lab work & pertinent test results  History of Anesthesia Complications (+) PONV and history of anesthetic complications  Airway Mallampati: II  TM Distance: >3 FB Neck ROM: Full    Dental   Pulmonary COPD, former smoker,     + decreased breath sounds      Cardiovascular hypertension, Pt. on medications  Rhythm:Regular Rate:Normal     Neuro/Psych    GI/Hepatic negative GI ROS, Neg liver ROS,   Endo/Other  negative endocrine ROS  Renal/GU negative Renal ROS     Musculoskeletal negative musculoskeletal ROS (+)   Abdominal   Peds  Hematology negative hematology ROS (+)   Anesthesia Other Findings   Reproductive/Obstetrics                            Lab Results  Component Value Date   WBC 11.2 (H) 04/13/2018   HGB 11.8 (L) 04/13/2018   HCT 36.2 04/13/2018   MCV 94.8 04/13/2018   PLT 292 04/13/2018   Lab Results  Component Value Date   CREATININE 0.69 04/14/2018   BUN 22 04/14/2018   NA 137 04/14/2018   K 3.9 04/14/2018   CL 102 04/14/2018   CO2 27 04/14/2018   Lab Results  Component Value Date   INR 0.96 04/06/2018   INR 1.03 03/06/2018   INR 1.01 12/16/2011   EKG: sinus rhythm, PAC's, RBBB.  Anesthesia Physical Anesthesia Plan  ASA: III  Anesthesia Plan: General   Post-op Pain Management:    Induction: Intravenous  PONV Risk Score and Plan: 4 or greater and Ondansetron, Dexamethasone, Midazolam and Treatment may vary due to age or medical condition  Airway Management Planned: Oral ETT  Additional Equipment:   Intra-op Plan:   Post-operative Plan: Extubation in OR  Informed Consent:   Plan Discussed with:   Anesthesia Plan Comments:         Anesthesia Quick Evaluation

## 2018-04-16 NOTE — Interval H&P Note (Signed)
History and Physical Interval Note:  04/16/2018 3:44 PM  Valerie Mosley  has presented today for surgery, with the diagnosis of AIRLEAK  The various methods of treatment have been discussed with the patient and family. After consideration of risks, benefits and other options for treatment, the patient has consented to  Procedure(s): INSERTION OF INTERBRONCHIAL VALVE (IBV) (N/A) VIDEO BRONCHOSCOPY (N/A) as a surgical intervention .  The patient's history has been reviewed, patient examined, no change in status, stable for surgery.  I have reviewed the patient's chart and labs.  Questions were answered to the patient's satisfaction.     [name]

## 2018-04-16 NOTE — Transfer of Care (Signed)
Immediate Anesthesia Transfer of Care Note  Patient: Valerie Mosley  Procedure(s) Performed: INSERTION OF INTERBRONCHIAL VALVE (IBV) (Left ) VIDEO BRONCHOSCOPY (N/A )  Patient Location: PACU  Anesthesia Type:General  Level of Consciousness: awake, alert  and oriented  Airway & Oxygen Therapy: Patient Spontanous Breathing and Patient connected to nasal cannula oxygen  Post-op Assessment: Report given to RN, Post -op Vital signs reviewed and stable and Patient moving all extremities  Post vital signs: Reviewed and stable  Last Vitals:  Vitals Value Taken Time  BP 108/68 04/16/2018  6:04 PM  Temp 37.1 C 04/16/2018  6:00 PM  Pulse 101 04/16/2018  6:08 PM  Resp 18 04/16/2018  6:08 PM  SpO2 91 % 04/16/2018  6:08 PM  Vitals shown include unvalidated device data.  Last Pain:  Vitals:   04/16/18 1800  TempSrc:   PainSc: 0-No pain      Patients Stated Pain Goal: 3 (76/14/70 9295)  Complications: No apparent anesthesia complications

## 2018-04-16 NOTE — Brief Op Note (Signed)
04/16/2018  8:33 PM  PATIENT:  Valerie Mosley  67 y.o. female  PRE-OPERATIVE DIAGNOSIS:  AIRLEAK  POST-OPERATIVE DIAGNOSIS:  AIRLEAK  PROCEDURE:  Procedure(s): INSERTION OF INTERBRONCHIAL VALVE (IBV) (Left) VIDEO BRONCHOSCOPY (N/A)  SURGEON:  Surgeon(s) and Role:    * [name], MD - Primary  PHYSICIAN ASSISTANT:   ASSISTANTS: none   ANESTHESIA:   general  EBL:  minimal  BLOOD ADMINISTERED:none  DRAINS: 2 interbronchial valves placed   LOCAL MEDICATIONS USED:  NONE  SPECIMEN:  No Specimen  DISPOSITION OF SPECIMEN:  N/A  COUNTS:  NO endo  TOURNIQUET:  * No tourniquets in log *  DICTATION: .Other Dictation: Dictation Number -  PLAN OF CARE: Admit to inpatient   PATIENT DISPOSITION:  PACU - hemodynamically stable.   Delay start of Pharmacological VTE agent (>24hrs) due to surgical blood loss or risk of bleeding: no

## 2018-04-17 ENCOUNTER — Inpatient Hospital Stay (HOSPITAL_COMMUNITY): Payer: Medicare Other

## 2018-04-17 NOTE — Discharge Summary (Addendum)
Physician Discharge Summary  Patient ID: Valerie Mosley MRN: [id number] DOB/AGE: 1951-04-27 67 y.o.  Admit date: 04/08/2018 Discharge date: 04/24/2018  Admission Diagnoses: Left lung mass  Discharge Diagnoses: Adenocarcinoma left lower lobe- T1Nx, stage IA Active Problems:   Mass of left lung   Patient Active Problem List   Diagnosis Date Noted  . Mass of left lung 04/08/2018  . Fever blister 10/30/2017  . Allergic rhinitis 03/04/2016  . Non-small cell lung cancer ([place]) 01/08/2012  . COPD (chronic obstructive pulmonary disease) ([place]) 07/29/2011  . Tobacco abuse 07/29/2011  . Kyphoscoliosis 07/29/2011   History of present illness:  The patient is a 67 year old female with a past history of hypertension, tobacco abuse, COPD, and stage Ia adenocarcinoma treated with a thoracoscopic left upper lobectomy in 2013.  She has been followed with serial CT scans since her resection in 2013.  And her most recent annual CT it showed a increase in size of a left apical nodule.  This was previously thought to be scarring./CT scan of the chest showed this to be hypermetabolic.  She had a CT-guided needle biopsy on 03/06/2018.  She was admitted this hospitalization for further resection.   Discharged Condition: good  Hospital Course: The patient was admitted electively and on 04/08/2018 taken to the operating room where she underwent the below described procedure.  He tolerated well was taken to the postanesthesia care unit in stable condition.  Postoperative hospital course:  The patient initially had some significant difficulty with oxygenation.  She required increasing levels of supplementation including high flow nasal cannula.  Due to this pulmonary medicine consultation was obtained to assist with management. She was started on Zosyn for possible aspiration and Metaneb therapy.  She also responded to some diuresis.  Aeration improved on chest x-ray with these maneuvers.  Oxygen was able to be  weaned over time.  She did however develop a significant air leak which was monitored in the usual fashion for several days but ultimately was determined that attempt should be made to place endobronchial valves.  This was done on 04/16/2018. She continued to have an air leak. On 8/7 we switched her chest tube to a mini express. Her CXR was stable the following morning and showed a small air space near the apical portion of the left pleural space. She had some subcutaneous emphysema on her left side as well which was stable. She was weaning off supplemental oxygen to room air with good saturation. She was ambulating with limited assistance, her incisions were healing well, and she was ready for discharge home with her husband. She will have home health to assist with dressing changes for her chest tube.     Consults: pulmonary/intensive care  Significant Diagnostic Studies: Routine postoperative labs and serial chest x-rays.  Treatments: surgery:  OPERATIVE REPORT  DATE OF PROCEDURE:  04/08/2018  PREOPERATIVE DIAGNOSIS:  Adenocarcinoma of left lower lobe, clinical stage IA (T1N0).  POSTOPERATIVE DIAGNOSIS:  Adenocarcinoma of left lower lobe, clinical stage IA (T1N0).  PROCEDURE:   Redo left video-assisted thoracoscopy,  Wedge resection left lower lobe nodule.  SURGEON:   Revonda Standard. Roxan Hockey, MD  ASSISTANT:  Jadene Pierini, PA-C.  ANESTHESIA:  General.  FINDINGS:  Severe adhesions laterally.  Minimal adhesions at the apex.  Wedge resection performed with 2 cm gross margin. Margin negative for tumor on frozen section.  04/16/2018  8:33 PM  PATIENT:  Valerie Mosley  67 y.o. female  PRE-OPERATIVE DIAGNOSIS:  AIRLEAK  POST-OPERATIVE DIAGNOSIS:  AIRLEAK  PROCEDURE:  Procedure(s): INSERTION OF INTERBRONCHIAL VALVE (IBV) (Left) VIDEO BRONCHOSCOPY (N/A)  SURGEON:  Surgeon(s) and Role:    * [name], MD - Primary  PHYSICIAN ASSISTANT:   ASSISTANTS:  none   ANESTHESIA:   general    Discharge Exam: Blood pressure 113/75, pulse 90, temperature 98 F (36.7 C), temperature source Oral, resp. rate (!) 26, height 5\' 4"  (1.626 m), weight 56.9 kg, SpO2 93 %.   General appearance: alert, cooperative and no distress Heart: regular rate and rhythm, S1, S2 normal, no murmur, click, rub or gallop Lungs: course breath sounds throughout Abdomen: soft, non-tender; bowel sounds normal; no masses,  no organomegaly Extremities: extremities normal, atraumatic, no cyanosis or edema Wound: clean and dry  Disposition:    Allergies as of 04/24/2018   No Known Allergies     Medication List    STOP taking these medications   acyclovir ointment 5 % Commonly known as:  ZOVIRAX     TAKE these medications   acetaminophen 325 MG tablet Commonly known as:  TYLENOL Take 2 tablets (650 mg total) by mouth every 6 (six) hours as needed for moderate pain. What changed:    medication strength  how much to take  reasons to take this  additional instructions   albuterol 108 (90 Base) MCG/ACT inhaler Commonly known as:  PROVENTIL HFA;VENTOLIN HFA Inhale 2 puffs into the lungs every 4 (four) hours as needed for wheezing or shortness of breath. For shortness of breath   alendronate 70 MG tablet Commonly known as:  FOSAMAX Take 70 mg by mouth once a week. Take on Friday with a full glass of water on an empty stomach.   amLODipine 10 MG tablet Commonly known as:  NORVASC Take 10 mg by mouth daily.   colestipol 1 g tablet Commonly known as:  COLESTID Take 1 g by mouth daily.   fluticasone 50 MCG/ACT nasal spray Commonly known as:  FLONASE instill 2 sprays into each nostril once daily What changed:  See the new instructions.   guaiFENesin 600 MG 12 hr tablet Commonly known as:  MUCINEX Take 1 tablet (600 mg total) by mouth 2 (two) times daily.   montelukast 10 MG tablet Commonly known as:  SINGULAIR Take 1 tablet (10 mg total) by mouth  at bedtime.   oxyCODONE 5 MG immediate release tablet Commonly known as:  Oxy IR/ROXICODONE Take 1 tablet (5 mg total) by mouth every 3 (three) hours as needed for severe pain.   predniSONE 5 MG tablet Commonly known as:  DELTASONE Take 7 tablets (35 mg total) by mouth daily for 1 day, THEN 6 tablets (30 mg total) daily for 1 day, THEN 5 tablets (25 mg total) daily for 1 day, THEN 4 tablets (20 mg total) daily for 1 day, THEN 3 tablets (15 mg total) daily for 1 day, THEN 2 tablets (10 mg total) daily for 1 day, THEN 1 tablet (5 mg total) daily for 1 day. Start taking on:  04/25/2018   sertraline 50 MG tablet Commonly known as:  ZOLOFT Take 50 mg by mouth at bedtime.   tiotropium 18 MCG inhalation capsule Commonly known as:  SPIRIVA inhale the contents of one capsule in the handihaler once daily What changed:    how much to take  how to take this  when to take this  additional instructions   traMADol 50 MG tablet Commonly known as:  ULTRAM Take 1 tablet (50 mg total) by mouth every  6 (six) hours as needed for moderate pain.   Vitamin D (Ergocalciferol) 50000 units Caps capsule Commonly known as:  DRISDOL Take 50,000 Units by mouth 2 (two) times a week. Tuesdays and Fridays      Follow-up Information    [name], MD Follow up.   Specialty:  Cardiothoracic Surgery Why:  Your routine follow-up appointment is on 04/28/2018 at 10:30am. Please arrive at 10:00am for a chest xray located at [facility] which is on the first floor of our building.  Contact information: [address] [place] [address] [phone]        [name], MD. Call in 1 day(s).   Specialty:  Family Medicine Contact information: [address] [place] [address] [phone]           Signed: Elgie Collard 04/24/2018, 8:42 AM

## 2018-04-17 NOTE — Progress Notes (Signed)
1 Day Post-Op Procedure(s) (LRB): INSERTION OF INTERBRONCHIAL VALVE (IBV) (Left) VIDEO BRONCHOSCOPY (N/A) Subjective: No complaints this AM  Objective: Vital signs in last 24 hours: Temp:  [97.3 F (36.3 C)-99 F (37.2 C)] 98.1 F (36.7 C) (08/02 0328) Pulse Rate:  [76-112] 76 (08/02 0328) Cardiac Rhythm: Normal sinus rhythm (08/01 1931) Resp:  [9-27] 18 (08/02 0355) BP: (92-117)/(56-75) 117/74 (08/02 0328) SpO2:  [85 %-98 %] 96 % (08/02 0355)  Hemodynamic parameters for last 24 hours:    Intake/Output from previous day: 08/01 0701 - 08/02 0700 In: 900 [I.V.:900] Out: 710 [Urine:600; Chest Tube:110] Intake/Output this shift: Total I/O In: -  Out: 710 [Urine:600; Chest Tube:110]  General appearance: alert, cooperative and no distress Neurologic: intact Heart: regular rate and rhythm Lungs: air leak sounds on left + air leak  Lab Results: No results for input(s): WBC, HGB, HCT, PLT in the last 72 hours. BMET: No results for input(s): NA, K, CL, CO2, GLUCOSE, BUN, CREATININE, CALCIUM in the last 72 hours.  PT/INR: No results for input(s): LABPROT, INR in the last 72 hours. ABG    Component Value Date/Time   PHART 7.351 04/09/2018 0348   HCO3 24.2 04/09/2018 0348   TCO2 24.5 12/18/2011 0523   ACIDBASEDEF 0.7 04/09/2018 0348   O2SAT 87.6 04/09/2018 0348   CBG (last 3)  No results for input(s): GLUCAP in the last 72 hours.  Assessment/Plan: S/P Procedure(s) (LRB): INSERTION OF INTERBRONCHIAL VALVE (IBV) (Left) VIDEO BRONCHOSCOPY (N/A) -  Still has significant air leak on left after IBV placement Will try CT to water seal again today   LOS: 9 days    Valerie Mosley 04/17/2018

## 2018-04-17 NOTE — Op Note (Signed)
NAME: Valerie Mosley, BOW MEDICAL RECORD [id number] ACCOUNT [id number] DATE OF BIRTH:23-Nov-1950 FACILITY: [facility] LOCATION: [facility] PHYSICIAN:Brylei Pedley Chaya Jan, MD  OPERATIVE REPORT  DATE OF PROCEDURE:  04/16/2018  PREOPERATIVE DIAGNOSIS:  Postoperative air leak.  POSTOPERATIVE DIAGNOSIS:  Postoperative air leak.  PROCEDURE:  Video bronchoscopy with insertion of intrabronchial valves.  SURGEON:  Modesto Charon, MD  ASSISTANT:  None.  ANESTHESIA:  General.  FINDINGS:  Air leak diminished but not completely resolved with valve placement in the basilar segmental bronchus.  Distortion of more distal bronchi precluded placement of valves in those areas.  CLINICAL NOTE:  The patient is a 68 year old woman with a past history of left upper lobectomy for stage IA lung cancer 6 years ago.  She recently was found to have a new lung cancer in the superior segment of the left lower lobe.  She wished to proceed  with surgical resection rather than radiation.  She underwent a wedge resection.  During that procedure, there were extensive adhesions of the lung to the chest wall, and multiple air leaks occurred with the dissection of those adhesions.  She was  offered an attempt at intrabronchial valve placement to see if the valves would help the air leak resolve more rapidly.  The indications, risks, benefits, and alternatives were discussed in detail with the patient.  She understood and accepted the  risks and agreed to proceed.  OPERATIVE NOTE:  The patient was brought to the operating room on 04/16/2018.  She had induction of general anesthesia and was intubated.  After performing a timeout, flexible fiberoptic bronchoscopy was performed via the endotracheal tube.  On the  right, there was normal endobronchial anatomy with no endobronchial lesions.  There were some thick clear secretions, which were evacuated.  On the left side, the left upper lobe bronchial stump was well healed.  There  was some distortion of the  segmental bronchi, particularly the lateral segmental bronchus due to her previous surgery.  A #5 Fogarty catheter was used.  The balloon was inflated in the left main stem, and there was complete resolution of the air leak.  Further down, inflation of  the balloon in the superior segmental bronchus seemed to have no effect on the air leak.  Inflation in the basilar segmental bronchus eliminated the air leak.  An attempt was made to localize this to one of the subsegmental bronchi, but none of those  were able to be identified as the culprit site.  The medial and lateral segmental bronchi arose separately.  The anterior and posterior arose together and then bifurcated. Inflation of the balloon in this bronchus resulted in significant decrease in  the air leak but did not completely eliminate it.  The calibrated balloon then was used to measure this bronchus which measured for a 7 mm intrabronchial valve, and the valve was placed.  Initially, the valve was at an angle, and attempt to reposition  this resulted in dislodgement of the valve.  It was removed, and a new valve was placed with better seating.  There still was a significant air leak.  Inflation of the balloon in the lateral or medial segmental bronchus had no effect, but inflating in  the basilar segmental bronchus nearly completely eliminated the air leak.  Decision was made to try to place valves in the medial and lateral segmental bronchi.  The medial segmental bronchus sized for a 9 mm valve, which was deployed.  The lateral  segmental bronchus also sized for a 9 mm valve,  but because of distortion from her previous surgery, the valve could not be placed.  The decision then was made to try to occlude the common basilar segmental bronchus, and a 9 mm valve was placed there.   This did not eliminate the air leak but did improve it.  It was hoped that with some swelling around the valve that the seal would be tighter with  time.  The bronchoscope was removed.  The patient was extubated in the operating room and taken to the  [facility] in good condition.  LN/NUANCE  D:04/17/2018 T:04/17/2018 JOB:[id number]

## 2018-04-17 NOTE — Anesthesia Postprocedure Evaluation (Signed)
Anesthesia Post Note  Patient: ADESSA PRIMIANO  Procedure(s) Performed: INSERTION OF INTERBRONCHIAL VALVE (IBV) (Left ) VIDEO BRONCHOSCOPY (N/A )     Patient location during evaluation: PACU Anesthesia Type: General Level of consciousness: awake and alert Pain management: pain level controlled Vital Signs Assessment: post-procedure vital signs reviewed and stable Respiratory status: spontaneous breathing, nonlabored ventilation, respiratory function stable and patient connected to nasal cannula oxygen Cardiovascular status: blood pressure returned to baseline and stable Postop Assessment: no apparent nausea or vomiting Anesthetic complications: no    Last Vitals:  Vitals:   04/17/18 0355 04/17/18 0716  BP:    Pulse:  78  Resp: 18 20  Temp:    SpO2: 96% 97%    Last Pain:  Vitals:   04/17/18 0617  TempSrc:   PainSc: 0-No pain                 Tarren Sabree COKER

## 2018-04-18 ENCOUNTER — Inpatient Hospital Stay (HOSPITAL_COMMUNITY): Payer: Medicare Other

## 2018-04-18 NOTE — Progress Notes (Addendum)
      [address]       [place] [address]             [phone]      2 Days Post-Op Procedure(s) (LRB): INSERTION OF INTERBRONCHIAL VALVE (IBV) (Left) VIDEO BRONCHOSCOPY (N/A) Subjective: Spirits are better this morning. Her pain is well controlled on the PCA  Objective: Vital signs in last 24 hours: Temp:  [97.8 F (36.6 C)-99.7 F (37.6 C)] 98 F (36.7 C) (08/03 0821) Pulse Rate:  [79-100] 84 (08/03 0821) Cardiac Rhythm: Normal sinus rhythm (08/03 0700) Resp:  [16-26] 26 (08/03 0821) BP: (90-132)/(62-90) 110/69 (08/03 0821) SpO2:  [91 %-100 %] 100 % (08/03 0900)     Intake/Output from previous day: 08/02 0701 - 08/03 0700 In: 757.8 [P.O.:480; I.V.:277.8] Out: 1020 [Urine:900; Chest Tube:120] Intake/Output this shift: No intake/output data recorded.  General appearance: alert, cooperative and no distress Heart: regular rate and rhythm, S1, S2 normal, no murmur, click, rub or gallop Lungs: some crackles throughout, + expiratory wheeze  Abdomen: soft, non-tender; bowel sounds normal; no masses,  no organomegaly Extremities: extremities normal, atraumatic, no cyanosis or edema Wound: clean and dry  Lab Results: No results for input(s): WBC, HGB, HCT, PLT in the last 72 hours. BMET: No results for input(s): NA, K, CL, CO2, GLUCOSE, BUN, CREATININE, CALCIUM in the last 72 hours.  PT/INR: No results for input(s): LABPROT, INR in the last 72 hours. ABG    Component Value Date/Time   PHART 7.351 04/09/2018 0348   HCO3 24.2 04/09/2018 0348   TCO2 24.5 12/18/2011 0523   ACIDBASEDEF 0.7 04/09/2018 0348   O2SAT 87.6 04/09/2018 0348   CBG (last 3)  No results for input(s): GLUCAP in the last 72 hours.  Assessment/Plan: S/P Procedure(s) (LRB): INSERTION OF INTERBRONCHIAL VALVE (IBV) (Left) VIDEO BRONCHOSCOPY (N/A)  1. Pulm-s/p IBV on 8/1. CXR this morning showed no significant change in the left apical pneumothorax. Remains on 3L  for oxygen  support. +++airleak 2. CV-NSR in the 80s. BP well controlled. Continue Norvasc.  3. No new labs in the last several days. Will order for tomorrow.   Plan: Chest tube is on water seal. CXR stable today. There is still a large air leak with and without coughing. + fluid wave. Subcutaneous air has vastly improved. Keep chest tube in place.    LOS: 10 days    Elgie Collard 04/18/2018   Chart reviewed, patient examined, agree with above. CXR looks ok with trivial left apical ptx. Subcutaneous air a little improved. The air leak from her tube is moderate and not with every breath. There is some tidaling and she is maintaining expansion of her lung on water seal. I think this will stop with time. Continue to water seal.

## 2018-04-18 NOTE — Progress Notes (Signed)
New syringe of Fentanyl 46mcg/ml PCA dose added - programmed at 73mcg/q6minutes w/a max dose of 95mcg in one hour.  Scanner is broken.  Dosing confirmed per two RNs:  Fabienne Bruns, RN and Avon Gully, RN.

## 2018-04-19 ENCOUNTER — Inpatient Hospital Stay (HOSPITAL_COMMUNITY): Payer: Medicare Other

## 2018-04-19 LAB — CBC
HCT: 35 % — ABNORMAL LOW (ref 36.0–46.0)
Hemoglobin: 11.5 g/dL — ABNORMAL LOW (ref 12.0–15.0)
MCH: 30.9 pg (ref 26.0–34.0)
MCHC: 32.9 g/dL (ref 30.0–36.0)
MCV: 94.1 fL (ref 78.0–100.0)
Platelets: 353 10*3/uL (ref 150–400)
RBC: 3.72 MIL/uL — ABNORMAL LOW (ref 3.87–5.11)
RDW: 13.3 % (ref 11.5–15.5)
WBC: 15.4 10*3/uL — ABNORMAL HIGH (ref 4.0–10.5)

## 2018-04-19 LAB — BASIC METABOLIC PANEL
Anion gap: 6 (ref 5–15)
BUN: 16 mg/dL (ref 8–23)
CO2: 29 mmol/L (ref 22–32)
Calcium: 8.3 mg/dL — ABNORMAL LOW (ref 8.9–10.3)
Chloride: 102 mmol/L (ref 98–111)
Creatinine, Ser: 0.6 mg/dL (ref 0.44–1.00)
GFR calc Af Amer: >60 mL/min (ref >60)
GFR calc non Af Amer: >60 mL/min (ref >60)
Glucose, Bld: 96 mg/dL (ref 70–99)
Potassium: 3.5 mmol/L (ref 3.5–5.1)
Sodium: 137 mmol/L (ref 135–145)

## 2018-04-19 MED ORDER — FENTANYL CITRATE (PF) 100 MCG/2ML IJ SOLN: 25.0000 ug | INTRAMUSCULAR | Status: DC | PRN

## 2018-04-19 MED ORDER — ALBUTEROL SULFATE (2.5 MG/3ML) 0.083% IN NEBU
2.5000 mg | INHALATION_SOLUTION | Freq: Two times a day (BID) | RESPIRATORY_TRACT | Status: DC
  Administered 2018-04-19 – 2018-04-24 (×11): 2.5 mg via RESPIRATORY_TRACT
  Filled 2018-04-19 (×11): qty 3

## 2018-04-19 MED ORDER — TRAMADOL HCL 50 MG PO TABS
50.0000 mg | ORAL_TABLET | Freq: Four times a day (QID) | ORAL | Status: DC | PRN
  Administered 2018-04-20 – 2018-04-24 (×10): 50 mg via ORAL
  Filled 2018-04-19 (×10): qty 1

## 2018-04-19 MED ORDER — OXYCODONE HCL 5 MG PO TABS
5.0000 mg | ORAL_TABLET | ORAL | Status: DC | PRN
  Administered 2018-04-19 – 2018-04-23 (×8): 5 mg via ORAL
  Filled 2018-04-19 (×8): qty 1

## 2018-04-19 NOTE — Progress Notes (Addendum)
      [address]       [place] [address]             [phone]      3 Days Post-Op Procedure(s) (LRB): INSERTION OF INTERBRONCHIAL VALVE (IBV) (Left) VIDEO BRONCHOSCOPY (N/A) Subjective: Feels good this morning. She is doing her incentive spirometer  Objective: Vital signs in last 24 hours: Temp:  [98 F (36.7 C)-99.7 F (37.6 C)] 98 F (36.7 C) (08/04 0752) Pulse Rate:  [82-97] 95 (08/04 0752) Cardiac Rhythm: Normal sinus rhythm (08/04 0456) Resp:  [15-26] 24 (08/04 0752) BP: (96-134)/(61-87) 109/67 (08/04 0752) SpO2:  [95 %-100 %] 97 % (08/04 0752) Weight:  [56.2 kg (123 lb 12.8 oz)] 56.2 kg (123 lb 12.8 oz) (08/04 0456)     Intake/Output from previous day: 08/03 0701 - 08/04 0700 In: 720 [P.O.:720] Out: 150 [Chest Tube:150] Intake/Output this shift: Total I/O In: 0  Out: 500 [Urine:500]  General appearance: alert, cooperative and no distress Heart: regular rate and rhythm, S1, S2 normal, no murmur, click, rub or gallop Lungs: bilateral rhonchi Abdomen: soft, non-tender; bowel sounds normal; no masses,  no organomegaly Extremities: extremities normal, atraumatic, no cyanosis or edema Wound: clean and dry  Lab Results: Recent Labs    04/19/18 0228  WBC 15.4*  HGB 11.5*  HCT 35.0*  PLT 353   BMET:  Recent Labs    04/19/18 0228  NA 137  K 3.5  CL 102  CO2 29  GLUCOSE 96  BUN 16  CREATININE 0.60  CALCIUM 8.3*    PT/INR: No results for input(s): LABPROT, INR in the last 72 hours. ABG    Component Value Date/Time   PHART 7.351 04/09/2018 0348   HCO3 24.2 04/09/2018 0348   TCO2 24.5 12/18/2011 0523   ACIDBASEDEF 0.7 04/09/2018 0348   O2SAT 87.6 04/09/2018 0348   CBG (last 3)  No results for input(s): GLUCAP in the last 72 hours.  Assessment/Plan: S/P Procedure(s) (LRB): INSERTION OF INTERBRONCHIAL VALVE (IBV) (Left) VIDEO BRONCHOSCOPY (N/A)  1. Pulm-s/p IBV on 8/1. CXR this morning showed no significant change in  the left apical pneumothorax. Stable image.  Remains on 2L [place] for oxygen support. +++airleak 2. CV-NSR in the 90s. BP well controlled. Continue Norvasc.  3. H and H stable 11.5/35.0, platelets 353k 4. Renal-creatinine 0.60, electrolytes okay 5. Pain well controlled. Transition from PCA to oral pain medication. D/c central line.   Plan: Chest tube is on water seal. CXR stable today. There is still a large air leak with coughing. + fluid wave.  Keep chest tube in place until air leak resolves.      LOS: 11 days    Valerie Mosley 04/19/2018   Chart reviewed, patient examined, agree with above. Left lung is expanded and the subcutaneous air is resolving on water seal.

## 2018-04-19 NOTE — Progress Notes (Signed)
15 cc of Fentanyl wasted in sink with Tammy P, RN

## 2018-04-20 ENCOUNTER — Encounter (HOSPITAL_COMMUNITY): Payer: Self-pay | Admitting: Thoracic Surgery (Cardiothoracic Vascular Surgery)

## 2018-04-20 MED ORDER — POTASSIUM CHLORIDE CRYS ER 20 MEQ PO TBCR
40.0000 meq | EXTENDED_RELEASE_TABLET | Freq: Once | ORAL | Status: AC
  Administered 2018-04-20: 40 meq via ORAL
  Filled 2018-04-20: qty 2

## 2018-04-20 NOTE — Progress Notes (Addendum)
[address]       [place] [address]             [phone]      4 Days Post-Op Procedure(s) (LRB): INSERTION OF INTERBRONCHIAL VALVE (IBV) (Left) VIDEO BRONCHOSCOPY (N/A) Subjective: conts to slowly feel better, ambulation improving, clear sputum- less  Objective: Vital signs in last 24 hours: Temp:  [97.6 F (36.4 C)-99.1 F (37.3 C)] 98.7 F (37.1 C) (08/05 0405) Pulse Rate:  [88-101] 88 (08/04 1951) Cardiac Rhythm: Normal sinus rhythm (08/05 0700) Resp:  [17-25] 17 (08/05 0405) BP: (101-137)/(62-91) 137/90 (08/05 0405) SpO2:  [96 %-100 %] 97 % (08/05 0727)  Hemodynamic parameters for last 24 hours:    Intake/Output from previous day: 08/04 0701 - 08/05 0700 In: 720 [P.O.:720] Out: 1660 [Urine:1450; Chest Tube:210] Intake/Output this shift: No intake/output data recorded.  General appearance: alert, cooperative and no distress Heart: regular rate and rhythm Lungs: fairly clear throughout Abdomen: benign Extremities: no edema or calf tenderness Wound: incis healing well  Lab Results: Recent Labs    04/19/18 0228  WBC 15.4*  HGB 11.5*  HCT 35.0*  PLT 353   BMET:  Recent Labs    04/19/18 0228  NA 137  K 3.5  CL 102  CO2 29  GLUCOSE 96  BUN 16  CREATININE 0.60  CALCIUM 8.3*    PT/INR: No results for input(s): LABPROT, INR in the last 72 hours. ABG    Component Value Date/Time   PHART 7.351 04/09/2018 0348   HCO3 24.2 04/09/2018 0348   TCO2 24.5 12/18/2011 0523   ACIDBASEDEF 0.7 04/09/2018 0348   O2SAT 87.6 04/09/2018 0348   CBG (last 3)  No results for input(s): GLUCAP in the last 72 hours.  Meds Scheduled Meds: . albuterol  2.5 mg Nebulization BID  . amLODipine  10 mg Oral Daily  . budesonide (PULMICORT) nebulizer solution  0.25 mg Nebulization BID  . colestipol  1 g Oral Daily  . enoxaparin (LOVENOX) injection  40 mg Subcutaneous Daily  . guaiFENesin  600 mg Oral BID  . montelukast  10 mg Oral QHS  .  pantoprazole  40 mg Oral Daily  . predniSONE  40 mg Oral Q breakfast  . senna-docusate  1 tablet Oral QHS  . sertraline  50 mg Oral QHS   Continuous Infusions: . sodium chloride Stopped (04/16/18 0854)  . potassium chloride 10 mEq (04/13/18 0451)   PRN Meds:.acetaminophen, bisacodyl, fentaNYL (SUBLIMAZE) injection, fluticasone, Gerhardt's butt cream, menthol-cetylpyridinium, oxyCODONE, phenol, potassium chloride, traMADol  Xrays Dg Chest Port 1 View  Result Date: 04/19/2018 CLINICAL DATA:  Follow-up left pneumothorax EXAM: PORTABLE CHEST 1 VIEW COMPARISON:  04/18/2018 FINDINGS: Cardiac shadow is stable. Right jugular central line is again seen and stable. Left thoracostomy catheter is noted with apical pneumothorax stable from the prior exam. Right lung is well aerated. Coarsened interstitial markings are noted bilaterally and stable. Subcutaneous emphysema is again seen. IMPRESSION: Tubes and lines as described. Stable left apical pneumothorax.  No new focal abnormality is seen. Electronically Signed   By: Inez Catalina M.D.   On: 04/19/2018 07:12    Assessment/Plan: S/P Procedure(s) (LRB): INSERTION OF INTERBRONCHIAL VALVE (IBV) (Left) VIDEO BRONCHOSCOPY (N/A)  1 clinically conts to show gradual improvement. Moving air well on exam 2 Chest tube- + air leak on H2O seal- cont same 3 no CXR yet- since no real change on exam/air leak will obtain for tomorrow 4 cont aggressive pulm toilet/nebs  5 no new labs today- recheck in am, WBC was trending up , K+ 3.5 yesterday  LOS: 12 days    Valerie Mosley 04/20/2018  patient examined and medical record reviewed,agree with above note.Repeat CXR in am Valerie Mosley 04/20/2018

## 2018-04-21 ENCOUNTER — Inpatient Hospital Stay (HOSPITAL_COMMUNITY): Payer: Medicare Other

## 2018-04-21 LAB — BASIC METABOLIC PANEL
Anion gap: 7 (ref 5–15)
BUN: 14 mg/dL (ref 8–23)
CO2: 26 mmol/L (ref 22–32)
Calcium: 7.9 mg/dL — ABNORMAL LOW (ref 8.9–10.3)
Chloride: 101 mmol/L (ref 98–111)
Creatinine, Ser: 0.6 mg/dL (ref 0.44–1.00)
GFR calc Af Amer: >60 mL/min (ref >60)
GFR calc non Af Amer: >60 mL/min (ref >60)
Glucose, Bld: 93 mg/dL (ref 70–99)
Potassium: 4.1 mmol/L (ref 3.5–5.1)
Sodium: 134 mmol/L — ABNORMAL LOW (ref 135–145)

## 2018-04-21 LAB — CBC
HCT: 35.2 % — ABNORMAL LOW (ref 36.0–46.0)
Hemoglobin: 11.4 g/dL — ABNORMAL LOW (ref 12.0–15.0)
MCH: 30.7 pg (ref 26.0–34.0)
MCHC: 32.4 g/dL (ref 30.0–36.0)
MCV: 94.9 fL (ref 78.0–100.0)
Platelets: 384 10*3/uL (ref 150–400)
RBC: 3.71 MIL/uL — ABNORMAL LOW (ref 3.87–5.11)
RDW: 13.6 % (ref 11.5–15.5)
WBC: 14.7 10*3/uL — ABNORMAL HIGH (ref 4.0–10.5)

## 2018-04-21 NOTE — Progress Notes (Addendum)
[name]       [name]             [phone]     04/08/2018  PREOPERATIVE DIAGNOSIS:  Adenocarcinoma of left lower lobe, clinical stage IA (T1N0). POSTOPERATIVE DIAGNOSIS:  Adenocarcinoma of left lower lobe, clinical stage IA (T1N0). PROCEDURE:   Redo left video-assisted thoracoscopy,  Wedge resection left lower lobe nodule.      5 Days Post-Op Procedure(s) (LRB): INSERTION OF INTERBRONCHIAL VALVE (IBV) (Left) VIDEO BRONCHOSCOPY (N/A) Subjective: Feels ok, ambulation improving, remains on 2 liters with air leak about the same  Objective: Vital signs in last 24 hours: Temp:  [97.7 F (36.5 C)-99 F (37.2 C)] 98.9 F (37.2 C) (08/06 0317) Pulse Rate:  [83-103] 84 (08/06 0317) Cardiac Rhythm: Normal sinus rhythm (08/06 0317) Resp:  [15-27] 22 (08/06 0317) BP: (105-129)/(59-83) 117/78 (08/06 0317) SpO2:  [95 %-98 %] 95 % (08/06 0317) Weight:  [56.9 kg (125 lb 8 oz)] 56.9 kg (125 lb 8 oz) (08/05 1554)  Hemodynamic parameters for last 24 hours:    Intake/Output from previous day: 08/05 0701 - 08/06 0700 In: 140 [P.O.:140] Out: 885 [Chest Tube:885] Intake/Output this shift: No intake/output data recorded.  General appearance: alert, cooperative and no distress Heart: regular rate and rhythm Lungs: mildly dim in bases Abdomen: benign Extremities: no edema or calf tenderness Wound: incis healing well  Lab Results: Recent Labs    04/19/18 0228 04/21/18 0303  WBC 15.4* 14.7*  HGB 11.5* 11.4*  HCT 35.0* 35.2*  PLT 353 384   BMET:  Recent Labs    04/19/18 0228 04/21/18 0303  NA 137 134*  K 3.5 4.1  CL 102 101  CO2 29 26  GLUCOSE 96 93  BUN 16 14  CREATININE 0.60 0.60  CALCIUM 8.3* 7.9*    PT/INR: No results for input(s): LABPROT, INR in the last 72 hours. ABG    Component Value Date/Time   PHART 7.351 04/09/2018 0348   HCO3 24.2 04/09/2018 0348   TCO2 24.5 12/18/2011 0523   ACIDBASEDEF 0.7 04/09/2018 0348     O2SAT 87.6 04/09/2018 0348   CBG (last 3)  No results for input(s): GLUCAP in the last 72 hours.  Meds Scheduled Meds: . albuterol  2.5 mg Nebulization BID  . amLODipine  10 mg Oral Daily  . budesonide (PULMICORT) nebulizer solution  0.25 mg Nebulization BID  . colestipol  1 g Oral Daily  . enoxaparin (LOVENOX) injection  40 mg Subcutaneous Daily  . guaiFENesin  600 mg Oral BID  . montelukast  10 mg Oral QHS  . pantoprazole  40 mg Oral Daily  . predniSONE  40 mg Oral Q breakfast  . senna-docusate  1 tablet Oral QHS  . sertraline  50 mg Oral QHS   Continuous Infusions: . sodium chloride Stopped (04/16/18 0854)   PRN Meds:.acetaminophen, bisacodyl, fentaNYL (SUBLIMAZE) injection, fluticasone, Uilani Sanville's butt cream, menthol-cetylpyridinium, oxyCODONE, phenol, traMADol  Xrays Dg Chest 2 View  Result Date: 04/21/2018 CLINICAL DATA:  Check bronchial valve placement EXAM: CHEST - 2 VIEW COMPARISON:  04/19/2018 FINDINGS: Cardiac shadow is within normal limits. Left apical pneumothorax is again noted and stable. Left thoracostomy tube is again seen. Endobronchial valve is noted stable in appearance. The remainder of the exam is stable from the previous day. IMPRESSION: Stable changes when compared with the prior day. Electronically Signed   By: Inez Catalina M.D.   On: 04/21/2018 07:02  Assessment/Plan: S/P Procedure(s) (LRB): INSERTION OF INTERBRONCHIAL VALVE (IBV) (Left) VIDEO BRONCHOSCOPY (N/A)  1 remains stable with + air leak, CXR is stable in appearance. Moving air well, remains on 2 liters. 2 leukocytosis is improving 3 H/H is stable 4 renal fxn is normal range 5 Poss consider mini-express with home O2 if not resolved in next few days     LOS: 13 days    Valerie Mosley 04/21/2018   Walking well, still on o2 3+ air leak with cough Persistent apical space Sub q air decreasing Consider mini express- will place tomorrow if no improvement  I have seen and examined  [name] and agree with the above assessment  and plan.  [name] MD Beeper [phone] Office [phone] 04/21/2018 9:21 AM

## 2018-04-22 ENCOUNTER — Inpatient Hospital Stay (HOSPITAL_COMMUNITY): Payer: Medicare Other

## 2018-04-22 NOTE — Progress Notes (Addendum)
[address]       [place] [address]             [phone]      6 Days Post-Op Procedure(s) (LRB): INSERTION OF INTERBRONCHIAL VALVE (IBV) (Left) VIDEO BRONCHOSCOPY (N/A) Subjective: Feels pretty well , conts with large air leak, O2 has been weaned  Objective: Vital signs in last 24 hours: Temp:  [98 F (36.7 C)-99.1 F (37.3 C)] 98 F (36.7 C) (08/07 0751) Pulse Rate:  [85-141] 96 (08/07 0751) Cardiac Rhythm: Normal sinus rhythm (08/07 0751) Resp:  [17-34] 17 (08/07 0751) BP: (99-145)/(59-87) 99/67 (08/07 0751) SpO2:  [90 %-96 %] 93 % (08/07 0751)  Hemodynamic parameters for last 24 hours:    Intake/Output from previous day: 08/06 0701 - 08/07 0700 In: 360 [P.O.:360] Out: 150 [Chest Tube:150] Intake/Output this shift: No intake/output data recorded.  General appearance: alert, cooperative and no distress Heart: regular rate and rhythm Lungs: somewhat coarse throughout , + SQ air  Abdomen: benign Extremities: No edema or calf tenderness Wound: incis healing well  Lab Results: Recent Labs    04/21/18 0303  WBC 14.7*  HGB 11.4*  HCT 35.2*  PLT 384   BMET:  Recent Labs    04/21/18 0303  NA 134*  K 4.1  CL 101  CO2 26  GLUCOSE 93  BUN 14  CREATININE 0.60  CALCIUM 7.9*    PT/INR: No results for input(s): LABPROT, INR in the last 72 hours. ABG    Component Value Date/Time   PHART 7.351 04/09/2018 0348   HCO3 24.2 04/09/2018 0348   TCO2 24.5 12/18/2011 0523   ACIDBASEDEF 0.7 04/09/2018 0348   O2SAT 87.6 04/09/2018 0348   CBG (last 3)  No results for input(s): GLUCAP in the last 72 hours.  Meds Scheduled Meds: . albuterol  2.5 mg Nebulization BID  . amLODipine  10 mg Oral Daily  . budesonide (PULMICORT) nebulizer solution  0.25 mg Nebulization BID  . colestipol  1 g Oral Daily  . enoxaparin (LOVENOX) injection  40 mg Subcutaneous Daily  . guaiFENesin  600 mg Oral BID  . montelukast  10 mg Oral QHS  . pantoprazole   40 mg Oral Daily  . predniSONE  40 mg Oral Q breakfast  . senna-docusate  1 tablet Oral QHS  . sertraline  50 mg Oral QHS   Continuous Infusions: . sodium chloride Stopped (04/16/18 0854)   PRN Meds:.acetaminophen, bisacodyl, fentaNYL (SUBLIMAZE) injection, fluticasone, Valerie Mosley's butt cream, menthol-cetylpyridinium, oxyCODONE, phenol, traMADol  Xrays Dg Chest 2 View  Result Date: 04/21/2018 CLINICAL DATA:  Check bronchial valve placement EXAM: CHEST - 2 VIEW COMPARISON:  04/19/2018 FINDINGS: Cardiac shadow is within normal limits. Left apical pneumothorax is again noted and stable. Left thoracostomy tube is again seen. Endobronchial valve is noted stable in appearance. The remainder of the exam is stable from the previous day. IMPRESSION: Stable changes when compared with the prior day. Electronically Signed   By: Valerie Mosley M.D.   On: 04/21/2018 07:02    Assessment/Plan: S/P Procedure(s) (LRB): INSERTION OF INTERBRONCHIAL VALVE (IBV) (Left) VIDEO BRONCHOSCOPY (N/A)   1 stable overall 2 hemodyn stable in sinus rhythm 3 + air leak, about the same on H2O seal 4 CXR looks like may have some increase in Subq air- radiology reading pending 5 poss change to miniexpress   LOS: 14 days    [name] 04/22/2018 Pager [phone]  Chest xray reviewed , still apical  space poss more obvious sq air, but on exam is improving  Change to mini express today  Persistent air leak I have seen and examined Valerie Mosley and agree with the above assessment  and plan.  [name] MD Beeper [phone] Office [phone] 04/22/2018 8:53 AM

## 2018-04-22 NOTE — Plan of Care (Signed)

## 2018-04-22 NOTE — Plan of Care (Signed)
  Problem: Education: Goal: Knowledge of General Education information will improve Description Including pain rating scale, medication(s)/side effects and non-pharmacologic comfort measures Outcome: Progressing   Problem: Health Behavior/Discharge Planning: Goal: Ability to manage health-related needs will improve Outcome: Progressing   Problem: Clinical Measurements: Goal: Ability to maintain clinical measurements within normal limits will improve Outcome: Progressing Goal: Will remain free from infection Outcome: Progressing Goal: Diagnostic test results will improve Outcome: Progressing Goal: Respiratory complications will improve Outcome: Progressing Goal: Cardiovascular complication will be avoided Outcome: Progressing   Problem: Activity: Goal: Risk for activity intolerance will decrease Outcome: Progressing   Problem: Nutrition: Goal: Adequate nutrition will be maintained Outcome: Progressing   Problem: Coping: Goal: Level of anxiety will decrease Outcome: Progressing   Problem: Elimination: Goal: Will not experience complications related to bowel motility Outcome: Progressing Goal: Will not experience complications related to urinary retention Outcome: Progressing   Problem: Pain Managment: Goal: General experience of comfort will improve Outcome: Progressing   Problem: Safety: Goal: Ability to remain free from injury will improve Outcome: Progressing   Problem: Skin Integrity: Goal: Risk for impaired skin integrity will decrease Outcome: Progressing   Problem: Education: Goal: Knowledge of disease or condition will improve Outcome: Progressing Goal: Knowledge of the prescribed therapeutic regimen will improve Outcome: Progressing   Problem: Activity: Goal: Risk for activity intolerance will decrease Outcome: Progressing   Problem: Clinical Measurements: Goal: Postoperative complications will be avoided or minimized Outcome: Progressing    Problem: Respiratory: Goal: Respiratory status will improve Outcome: Progressing   Problem: Skin Integrity: Goal: Wound healing without signs and symptoms infection will improve Outcome: Progressing

## 2018-04-23 ENCOUNTER — Inpatient Hospital Stay (HOSPITAL_COMMUNITY): Payer: Medicare Other

## 2018-04-23 NOTE — Progress Notes (Addendum)
      [address]       [place] [address]             [phone]   04/08/2018  PREOPERATIVE DIAGNOSIS: Adenocarcinoma of left lower lobe, clinical stage IA (T1N0). POSTOPERATIVE DIAGNOSIS: Adenocarcinoma of left lower lobe, clinical stage IA (T1N0). PROCEDURE:  Redo left video-assisted thoracoscopy, Wedge resection left lower lobe nodule.    7 Days Post-Op Procedure(s) (LRB): INSERTION OF INTERBRONCHIAL VALVE (IBV) (Left) VIDEO BRONCHOSCOPY (N/A) Subjective: Feels good this morning. Off the oxygen.   Objective: Vital signs in last 24 hours: Temp:  [98 F (36.7 C)-98.9 F (37.2 C)] 98.6 F (37 C) (08/08 0739) Pulse Rate:  [88-183] 88 (08/08 0414) Cardiac Rhythm: Sinus tachycardia;Supraventricular tachycardia (08/07 2146) Resp:  [17-20] 20 (08/08 0414) BP: (99-128)/(65-90) 116/87 (08/08 0739) SpO2:  [75 %-95 %] 95 % (08/08 0414)     Intake/Output from previous day: 08/07 0701 - 08/08 0700 In: 1083 [P.O.:1080; I.V.:3] Out: 45 [Urine:4; Stool:1; Chest Tube:40] Intake/Output this shift: No intake/output data recorded.  General appearance: alert, cooperative and no distress Heart: regular rate and rhythm, S1, S2 normal, no murmur, click, rub or gallop Lungs: course breath sounds throughout Abdomen: soft, non-tender; bowel sounds normal; no masses,  no organomegaly Extremities: extremities normal, atraumatic, no cyanosis or edema Wound: clean and dry  Lab Results: Recent Labs    04/21/18 0303  WBC 14.7*  HGB 11.4*  HCT 35.2*  PLT 384   BMET:  Recent Labs    04/21/18 0303  NA 134*  K 4.1  CL 101  CO2 26  GLUCOSE 93  BUN 14  CREATININE 0.60  CALCIUM 7.9*    PT/INR: No results for input(s): LABPROT, INR in the last 72 hours. ABG    Component Value Date/Time   PHART 7.351 04/09/2018 0348   HCO3 24.2 04/09/2018 0348   TCO2 24.5 12/18/2011 0523   ACIDBASEDEF 0.7 04/09/2018 0348   O2SAT 87.6 04/09/2018 0348   CBG (last 3)  No  results for input(s): GLUCAP in the last 72 hours.  Assessment/Plan: S/P Procedure(s) (LRB): INSERTION OF INTERBRONCHIAL VALVE (IBV) (Left) VIDEO BRONCHOSCOPY (N/A)  1. Tolerating room air with good oxygen saturation. She now has a mini express. + air leak  2. CXR remains stable with a left apical space and left-sided subcutaneous air.  3. NSR in the 90s- ST in the low 100s. BP is well controlled.  4. H and H 11.4/35.2, platelets 384k 5. Renal-creatinine 0.60, electrolytes okay 6. Blood glucose level is well controlled-the patient isn't a diabetic.   Plan: Possibly home today with mini express. CXR stable, still has an air leak. Follow-up in the office with a CXR in 2 weeks.       LOS: 15 days    Valerie Mosley 04/23/2018   Monitor miniexpress 24 hours, pa and lat chest xray in am, then poss home with chest tube in place  Now odd 02  I have seen and examined Valerie Mosley and agree with the above assessment  and plan.  [name] MD Beeper [phone] Office [phone] 04/23/2018 8:41 AM

## 2018-04-23 NOTE — Discharge Instructions (Signed)
Thoracoscopy, Care After °Refer to this sheet in the next few weeks. These instructions provide you with information about caring for yourself after your procedure. Your health care provider may also give you more specific instructions. Your treatment has been planned according to current medical practices, but problems sometimes occur. Call your health care provider if you have any problems or questions after your procedure. °What can I expect after the procedure? °After your procedure, it is common to feel sore for up to two weeks. °Follow these instructions at home: °· There are many different ways to close and cover an incision, including stitches (sutures), skin glue, and adhesive strips. Follow your health care provider's instructions about: °? Incision care. °? Bandage (dressing) changes and removal. °? Incision closure removal. °· Check your incision area every day for signs of infection. Watch for: °? Redness, swelling, or pain. °? Fluid, blood, or pus. °· Take medicines only as directed by your health care provider. °· Try to cough often. Coughing helps to protect against lung infection (pneumonia). It may hurt to cough. If this happens, hold a pillow against your chest when you cough. °· Take deep breaths. This also helps to protect against pneumonia. °· If you were given an incentive spirometer, use it as directed by your health care provider. °· Do not take baths, swim, or use a hot tub until your health care provider approves. You may take showers. °· Avoid lifting until your health care provider approves. °· Avoid driving until your health care provider approves. °· Do not travel by airplane after the chest tube is removed until your health care provider approves. °Contact a health care provider if: °· You have a fever. °· Pain medicines do not ease your pain. °· You have redness, swelling, or increasing pain in your incision area. °· You develop a cough that does not go away, or you are coughing up  mucus that is yellow or green. °Get help right away if: °· You have fluid, blood, or pus coming from your incision. °· There is a bad smell coming from your incision or dressing. °· You develop a rash. °· You have difficulty breathing. °· You cough up blood. °· You develop light-headedness or you feel faint. °· You develop chest pain. °· Your heartbeat feels irregular or very fast. °This information is not intended to replace advice given to you by your health care provider. Make sure you discuss any questions you have with your health care provider. °Document Released: 03/22/2005 Document Revised: 05/05/2016 Document Reviewed: 05/18/2014 °Elsevier Interactive Patient Education © 2018 [employer] Inc. ° °

## 2018-04-24 ENCOUNTER — Inpatient Hospital Stay (HOSPITAL_COMMUNITY): Payer: Medicare Other

## 2018-04-24 MED ORDER — ACETAMINOPHEN 325 MG PO TABS: 650.0000 mg | ORAL_TABLET | Freq: Four times a day (QID) | ORAL | Status: DC | PRN

## 2018-04-24 MED ORDER — PREDNISONE 5 MG PO TABS: ORAL_TABLET | ORAL | 0 refills | Status: AC

## 2018-04-24 MED ORDER — OXYCODONE HCL 5 MG PO TABS: 5.0000 mg | ORAL_TABLET | ORAL | 0 refills | Status: DC | PRN

## 2018-04-24 MED ORDER — GUAIFENESIN ER 600 MG PO TB12: 600.0000 mg | ORAL_TABLET | Freq: Two times a day (BID) | ORAL | Status: DC

## 2018-04-24 MED ORDER — TRAMADOL HCL 50 MG PO TABS: 50.0000 mg | ORAL_TABLET | Freq: Four times a day (QID) | ORAL | 0 refills | Status: DC | PRN

## 2018-04-24 NOTE — Progress Notes (Signed)
Discharge instruction discussed and explained with patient. Extra gauze given. Patient going home with chest tube on mini express. Waiting on husband to come and get her.

## 2018-04-24 NOTE — Progress Notes (Signed)
      [address]       [place] [address]             [phone]      8 Days Post-Op Procedure(s) (LRB): INSERTION OF INTERBRONCHIAL VALVE (IBV) (Left) VIDEO BRONCHOSCOPY (N/A) Subjective: Doing well this morning. Ready for home.   Objective: Vital signs in last 24 hours: Temp:  [97.6 F (36.4 C)-98.9 F (37.2 C)] 98 F (36.7 C) (08/09 0722) Pulse Rate:  [78-91] 90 (08/09 0722) Cardiac Rhythm: Normal sinus rhythm (08/09 0722) Resp:  [12-27] 26 (08/09 0722) BP: (108-138)/(72-83) 113/75 (08/09 0722) SpO2:  [93 %-98 %] 93 % (08/09 0722)     Intake/Output from previous day: 08/08 0701 - 08/09 0700 In: 780 [P.O.:780] Out: 100 [Chest Tube:100] Intake/Output this shift: No intake/output data recorded.  General appearance: alert, cooperative and no distress Heart: regular rate and rhythm, S1, S2 normal, no murmur, click, rub or gallop Lungs: clear to auscultation bilaterally Abdomen: soft, non-tender; bowel sounds normal; no masses,  no organomegaly Extremities: extremities normal, atraumatic, no cyanosis or edema Wound: clean and dry  Lab Results: No results for input(s): WBC, HGB, HCT, PLT in the last 72 hours. BMET: No results for input(s): NA, K, CL, CO2, GLUCOSE, BUN, CREATININE, CALCIUM in the last 72 hours.  PT/INR: No results for input(s): LABPROT, INR in the last 72 hours. ABG    Component Value Date/Time   PHART 7.351 04/09/2018 0348   HCO3 24.2 04/09/2018 0348   TCO2 24.5 12/18/2011 0523   ACIDBASEDEF 0.7 04/09/2018 0348   O2SAT 87.6 04/09/2018 0348   CBG (last 3)  No results for input(s): GLUCAP in the last 72 hours.  Assessment/Plan: S/P Procedure(s) (LRB): INSERTION OF INTERBRONCHIAL VALVE (IBV) (Left) VIDEO BRONCHOSCOPY (N/A)  1. Tolerating room air with good oxygen saturation. She now has a mini express. + air leak  2. CXR remains stable with a left apical space and improved left-sided subcutaneous air.  3. NSR in the 90s-  ST in the low 100s. BP is well controlled.  4. H and H 11.4/35.2, platelets 384k-no new labs 5. Renal-creatinine 0.60, electrolytes okay 6. Blood glucose level is well controlled  Plan: Home today with mini express. Prednisone taper. Will follow-up in our office next week.    LOS: 16 days    Elgie Collard 04/24/2018

## 2018-04-24 NOTE — Consult Note (Signed)
            [facility] CM Primary Care Navigator  04/24/2018  Valerie Mosley Jan 19, 1951 [id number]   Went back to see patient at the bedside to identify possible discharge needs but she was just discharged per staff report.  Per MD note, patient was electively admitted on 7/24 for Redo Left Video Assisted Thoracoscopy with left lower lobe wedge for a left lung mass (stage 1a adenocarcinoma), developed significant air leak and had insertion of interbronchial valve.   Patient was discharged home with home health services. Patient has discharge instruction to follow-up with primary care provider in 1 day and cardiothoracic surgery on 04/28/18.  Primary care provider's office called [name]) to notifyofdischarge, need for post hospital follow-up and transition of care. Notified ofhealth issues needing follow-up.   Made aware to refer patient to [facility] care management if deemed necessaryandappropriate for services.   For additional questions please contact:  Edwena Felty A. Randall Colden, BSN, RN-BC [facility] PRIMARY CARE Navigator Cell: [phone]

## 2018-04-24 NOTE — Plan of Care (Signed)
  Problem: Skin Integrity: Goal: Wound healing without signs and symptoms infection will improve Outcome: Adequate for Discharge   Problem: Education: Goal: Knowledge of General Education information will improve Description Including pain rating scale, medication(s)/side effects and non-pharmacologic comfort measures Outcome: Completed/Met   Problem: Health Behavior/Discharge Planning: Goal: Ability to manage health-related needs will improve Outcome: Completed/Met   Problem: Clinical Measurements: Goal: Ability to maintain clinical measurements within normal limits will improve Outcome: Completed/Met Goal: Will remain free from infection Outcome: Completed/Met Goal: Diagnostic test results will improve Outcome: Completed/Met Goal: Respiratory complications will improve Outcome: Completed/Met Goal: Cardiovascular complication will be avoided Outcome: Completed/Met   Problem: Activity: Goal: Risk for activity intolerance will decrease Outcome: Completed/Met   Problem: Nutrition: Goal: Adequate nutrition will be maintained Outcome: Completed/Met   Problem: Coping: Goal: Level of anxiety will decrease Outcome: Completed/Met   Problem: Elimination: Goal: Will not experience complications related to bowel motility Outcome: Completed/Met Goal: Will not experience complications related to urinary retention Outcome: Completed/Met   Problem: Pain Managment: Goal: General experience of comfort will improve Outcome: Completed/Met   Problem: Safety: Goal: Ability to remain free from injury will improve Outcome: Completed/Met   Problem: Skin Integrity: Goal: Risk for impaired skin integrity will decrease Outcome: Completed/Met   Problem: Education: Goal: Knowledge of disease or condition will improve Outcome: Completed/Met Goal: Knowledge of the prescribed therapeutic regimen will improve Outcome: Completed/Met   Problem: Activity: Goal: Risk for activity  intolerance will decrease Outcome: Completed/Met   Problem: Clinical Measurements: Goal: Postoperative complications will be avoided or minimized Outcome: Completed/Met   Problem: Respiratory: Goal: Respiratory status will improve Outcome: Completed/Met  Pt going home with mini express chest tube

## 2018-04-24 NOTE — Care Management Note (Signed)
Case Management Note  Patient Details  Name: KEORA ECCLESTON MRN: [id number] Date of Birth: 1951-04-22  Subjective/Objective:   For dc home with mini express, will need HHRN, NCM gave patient [facility] for choice, she chose [facility], referral made to Butch Penny with [facility] for [facility].  Soc will begin 24-48 hrs post dc.                  Action/Plan: DC home with [facility] services when ready.  Expected Discharge Date:  04/24/18               Expected Discharge Plan:  [place]  In-House Referral:     Discharge planning Services  CM Consult  Post Acute Care Choice:  Home Health Choice offered to:  Patient  DME Arranged:    DME Agency:     HH Arranged:  RN [place] Agency:  [place]  Status of Service:  Completed, signed off  If discussed at [place] of Stay Meetings, dates discussed:    Additional Comments:  Zenon Mayo, RN 04/24/2018, 9:12 AM

## 2018-04-25 DIAGNOSIS — C3432 Malignant neoplasm of lower lobe, left bronchus or lung: Secondary | ICD-10-CM | POA: Diagnosis not present

## 2018-04-25 DIAGNOSIS — I10 Essential (primary) hypertension: Secondary | ICD-10-CM | POA: Diagnosis not present

## 2018-04-25 DIAGNOSIS — J449 Chronic obstructive pulmonary disease, unspecified: Secondary | ICD-10-CM | POA: Diagnosis not present

## 2018-04-25 DIAGNOSIS — Z87891 Personal history of nicotine dependence: Secondary | ICD-10-CM | POA: Diagnosis not present

## 2018-04-25 DIAGNOSIS — Z438 Encounter for attention to other artificial openings: Secondary | ICD-10-CM | POA: Diagnosis not present

## 2018-04-28 ENCOUNTER — Other Ambulatory Visit: Payer: Self-pay | Admitting: Thoracic Surgery (Cardiothoracic Vascular Surgery)

## 2018-04-28 ENCOUNTER — Ambulatory Visit
Admission: RE | Admit: 2018-04-28 | Discharge: 2018-04-28 | Disposition: A | Discharge status: Home / Self Care | Payer: 59 | Source: Ambulatory Visit | Attending: Thoracic Surgery (Cardiothoracic Vascular Surgery) | Admitting: Thoracic Surgery (Cardiothoracic Vascular Surgery)

## 2018-04-28 ENCOUNTER — Ambulatory Visit (INDEPENDENT_AMBULATORY_CARE_PROVIDER_SITE_OTHER): Payer: Self-pay | Admitting: Thoracic Surgery (Cardiothoracic Vascular Surgery)

## 2018-04-28 VITALS — BP 94/56 | HR 84 | Resp 20 | Ht 64.0 in | Wt 118.0 lb

## 2018-04-28 DIAGNOSIS — IMO0002 Reserved for concepts with insufficient information to code with codable children: Secondary | ICD-10-CM

## 2018-04-28 DIAGNOSIS — C3490 Malignant neoplasm of unspecified part of unspecified bronchus or lung: Secondary | ICD-10-CM

## 2018-04-28 DIAGNOSIS — C3432 Malignant neoplasm of lower lobe, left bronchus or lung: Secondary | ICD-10-CM | POA: Diagnosis not present

## 2018-04-28 DIAGNOSIS — J9382 Other air leak: Secondary | ICD-10-CM

## 2018-04-28 DIAGNOSIS — C3492 Malignant neoplasm of unspecified part of left bronchus or lung: Secondary | ICD-10-CM

## 2018-04-28 NOTE — Progress Notes (Signed)
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  She recently was found to have a new adenocarcinoma in the superior segment of her left lower lobe.  We discussed surgery and radiation.  She favored surgical resection.  I did a wedge resection on 04/08/2018.  She had a persistent air leak afterwards and I placed intrabronchial valves on 04/16/2018.  She was discharged home with a chest tube in place on 04/24/2018.  She has been doing well at home.  She still hears air movement in the tube.  She is taking tramadol about once a day and also is using Tylenol for pain.  Past Medical History:  Diagnosis Date  . Cancer ([place])    Stage IA non-small cell lung cancer, left upper lobectomy 12/2011  . COPD (chronic obstructive pulmonary disease) ([place])   . Cough 07/29/11   started coughing up blood this am  . Cough   . Hypertension   . Kyphoscoliosis   . PONV (postoperative nausea and vomiting)   . Recurrent upper respiratory infection (URI)     Current Outpatient Medications  Medication Sig Dispense Refill  . acetaminophen (TYLENOL) 325 MG tablet Take 2 tablets (650 mg total) by mouth every 6 (six) hours as needed for moderate pain.    Marland Kitchen albuterol (VENTOLIN HFA) 108 (90 Base) MCG/ACT inhaler Inhale 2 puffs into the lungs every 4 (four) hours as needed for wheezing or shortness of breath. For shortness of breath 8.51 g 5  . alendronate (FOSAMAX) 70 MG tablet Take 70 mg by mouth once a week. Take on Friday with a full glass of water on an empty stomach.    Marland Kitchen amLODipine (NORVASC) 10 MG tablet Take 10 mg by mouth daily.      . colestipol (COLESTID) 1 g tablet Take 1 g by mouth daily.    . fluticasone (FLONASE) 50 MCG/ACT nasal spray instill 2 sprays into each  nostril once daily (Patient taking differently: instill 2 sprays into each nostril once daily as needed for allergies) 16 g 5  . guaiFENesin (MUCINEX) 600 MG 12 hr tablet Take 1 tablet (600 mg total) by mouth 2 (two) times daily.    . montelukast (SINGULAIR) 10 MG tablet Take 1 tablet (10 mg total) by mouth at bedtime. 30 tablet 5  . predniSONE (DELTASONE) 5 MG tablet Take 7 tablets (35 mg total) by mouth daily for 1 day, THEN 6 tablets (30 mg total) daily for 1 day, THEN 5 tablets (25 mg total) daily for 1 day, THEN 4 tablets (20 mg total) daily for 1 day, THEN 3 tablets (15 mg total) daily for 1 day, THEN 2 tablets (10 mg total) daily for 1 day, THEN 1 tablet (5 mg total) daily for 1 day. 28 tablet 0  . sertraline (ZOLOFT) 50 MG tablet Take 50 mg by mouth at bedtime.    Marland Kitchen tiotropium (SPIRIVA HANDIHALER) 18 MCG inhalation capsule inhale the contents of one capsule in the handihaler once daily (Patient taking differently: Place 18 mcg into inhaler and inhale daily. ) 30 capsule 5  . traMADol (ULTRAM) 50 MG tablet Take 1 tablet (50 mg total) by mouth  every 6 (six) hours as needed for moderate pain. 15 tablet 0  . Vitamin D, Ergocalciferol, (DRISDOL) 50000 units CAPS capsule Take 50,000 Units by mouth 2 (two) times a week. Tuesdays and Fridays     No current facility-administered medications for this visit.     Physical Exam BP (!) 94/56   Pulse 84   Resp 20   Ht 5\' 4"  (1.626 m)   Wt 118 lb (53.5 kg)   SpO2 97% Comment: RA  BMI 20.68 kg/m  67 year old woman in no acute distress Alert and oriented x3 with no focal deficits Lungs clear on right, chest tube sounds on left Positive air leak Incisions healing well, chest tube site clean and dry  Diagnostic Tests: CHEST - 2 VIEW  COMPARISON:  04/24/2018  FINDINGS: Apical component of residual left pneumothorax is fairly stable. There is slight increase in basilar component of pneumothorax. Intrabronchial valves shows stable positioning  as does a single left chest tube. Stable chronic lung disease without airspace consolidation or pulmonary edema. No pleural fluid identified. Stable subcutaneous air along the left lateral chest wall and axillary region.  IMPRESSION: Fairly stable apical component of residual left pneumothorax and slight increase in basilar component of pneumothorax.   Electronically Signed   By: Aletta Edouard M.D.   On: 04/28/2018 10:22  I personally reviewed the chest x-ray images and concur with the findings noted above  Impression: Valerie Mosley is a 67 year old woman who had a wedge resection for a stage Ia adenocarcinoma.  This was a redo VATS.  She is had a prolonged air leak postoperatively.  I suspect the air leak is from attempting to dissect the basilar portion of the lower lobe off the chest wall rather than the staple line in the superior segment.  I tried intrabronchial valve placement which decrease the air leak but did not eliminate it.  Air leak is still present.  Need to leave the chest tube in place.  She is doing well with this at home.  Plan: Return in 1 week with repeat PA and lateral chest x-ray  [name], MD [facility] [phone]

## 2018-04-30 DIAGNOSIS — I10 Essential (primary) hypertension: Secondary | ICD-10-CM | POA: Diagnosis not present

## 2018-04-30 DIAGNOSIS — Z438 Encounter for attention to other artificial openings: Secondary | ICD-10-CM | POA: Diagnosis not present

## 2018-04-30 DIAGNOSIS — J449 Chronic obstructive pulmonary disease, unspecified: Secondary | ICD-10-CM | POA: Diagnosis not present

## 2018-04-30 DIAGNOSIS — C3432 Malignant neoplasm of lower lobe, left bronchus or lung: Secondary | ICD-10-CM | POA: Diagnosis not present

## 2018-04-30 DIAGNOSIS — Z87891 Personal history of nicotine dependence: Secondary | ICD-10-CM | POA: Diagnosis not present

## 2018-05-04 ENCOUNTER — Other Ambulatory Visit: Payer: Self-pay | Admitting: Thoracic Surgery (Cardiothoracic Vascular Surgery)

## 2018-05-04 DIAGNOSIS — C3490 Malignant neoplasm of unspecified part of unspecified bronchus or lung: Secondary | ICD-10-CM

## 2018-05-05 ENCOUNTER — Ambulatory Visit
Admission: RE | Admit: 2018-05-05 | Discharge: 2018-05-05 | Disposition: A | Discharge status: Home / Self Care | Payer: 59 | Source: Ambulatory Visit | Attending: Thoracic Surgery (Cardiothoracic Vascular Surgery) | Admitting: Thoracic Surgery (Cardiothoracic Vascular Surgery)

## 2018-05-05 ENCOUNTER — Other Ambulatory Visit: Payer: Self-pay | Admitting: *Deleted

## 2018-05-05 ENCOUNTER — Ambulatory Visit (INDEPENDENT_AMBULATORY_CARE_PROVIDER_SITE_OTHER): Payer: Self-pay | Admitting: Thoracic Surgery (Cardiothoracic Vascular Surgery)

## 2018-05-05 ENCOUNTER — Encounter: Payer: Self-pay | Admitting: Thoracic Surgery (Cardiothoracic Vascular Surgery)

## 2018-05-05 VITALS — BP 100/76 | HR 105 | Resp 20 | Ht 64.0 in | Wt 118.0 lb

## 2018-05-05 DIAGNOSIS — J95812 Postprocedural air leak: Secondary | ICD-10-CM

## 2018-05-05 DIAGNOSIS — G8918 Other acute postprocedural pain: Secondary | ICD-10-CM

## 2018-05-05 DIAGNOSIS — J9382 Other air leak: Secondary | ICD-10-CM

## 2018-05-05 DIAGNOSIS — IMO0002 Reserved for concepts with insufficient information to code with codable children: Secondary | ICD-10-CM

## 2018-05-05 DIAGNOSIS — C3490 Malignant neoplasm of unspecified part of unspecified bronchus or lung: Secondary | ICD-10-CM

## 2018-05-05 DIAGNOSIS — J449 Chronic obstructive pulmonary disease, unspecified: Secondary | ICD-10-CM | POA: Diagnosis not present

## 2018-05-05 DIAGNOSIS — C3492 Malignant neoplasm of unspecified part of left bronchus or lung: Secondary | ICD-10-CM

## 2018-05-05 MED ORDER — TRAMADOL HCL 50 MG PO TABS: 50.0000 mg | ORAL_TABLET | Freq: Four times a day (QID) | ORAL | 0 refills | Status: DC | PRN

## 2018-05-05 NOTE — Progress Notes (Signed)
[address]       [place] [address]             [phone]     HPI: Valerie Mosley returns for a scheduled follow up visit  Valerie Mosley is a 67 year old woman with a history of tobacco abuse and COPD.  She had a left upper lobectomy for stage I adenocarcinoma in 2013.  She recently was found to have a new adenocarcinoma in the superior segment of her left lower lobe.  I did a VATS and wedge resection on 04/08/2018.  She had a persistent air leak postoperatively.  Intrabronchial valve replaced on 04/16/2018.  Her leak still persisted and she was sent home with a chest tube on 04/24/2018.  Today she complains of more pain at the chest tube site and anterior to that.  She says it has been that way for a couple of days.  She has not noted any redness or drainage.  She says that she used to hear a "pop" when she coughed but has not for the past 3 days.  Past Medical History:  Diagnosis Date  . Cancer ([place])    Stage IA non-small cell lung cancer, left upper lobectomy 12/2011  . COPD (chronic obstructive pulmonary disease) ([place])   . Cough 07/29/11   started coughing up blood this am  . Cough   . Hypertension   . Kyphoscoliosis   . PONV (postoperative nausea and vomiting)   . Recurrent upper respiratory infection (URI)      Current Outpatient Medications  Medication Sig Dispense Refill  . acetaminophen (TYLENOL) 325 MG tablet Take 2 tablets (650 mg total) by mouth every 6 (six) hours as needed for moderate pain.    Marland Kitchen albuterol (VENTOLIN HFA) 108 (90 Base) MCG/ACT inhaler Inhale 2 puffs into the lungs every 4 (four) hours as needed for wheezing or shortness of breath. For shortness of breath 8.51 g 5  . alendronate (FOSAMAX) 70 MG tablet Take 70 mg by mouth once a week. Take on Friday with a full glass of water on an empty stomach.    Marland Kitchen amLODipine (NORVASC) 10 MG tablet Take 10 mg by mouth daily.      . colestipol (COLESTID) 1 g tablet Take 1 g by mouth daily.    .  fluticasone (FLONASE) 50 MCG/ACT nasal spray instill 2 sprays into each nostril once daily (Patient taking differently: instill 2 sprays into each nostril once daily as needed for allergies) 16 g 5  . guaiFENesin (MUCINEX) 600 MG 12 hr tablet Take 1 tablet (600 mg total) by mouth 2 (two) times daily.    . montelukast (SINGULAIR) 10 MG tablet Take 1 tablet (10 mg total) by mouth at bedtime. 30 tablet 5  . sertraline (ZOLOFT) 50 MG tablet Take 50 mg by mouth at bedtime.    Marland Kitchen tiotropium (SPIRIVA HANDIHALER) 18 MCG inhalation capsule inhale the contents of one capsule in the handihaler once daily (Patient taking differently: Place 18 mcg into inhaler and inhale daily. ) 30 capsule 5  . traMADol (ULTRAM) 50 MG tablet Take 1 tablet (50 mg total) by mouth every 6 (six) hours as needed for moderate pain. 20 tablet 0  . Vitamin D, Ergocalciferol, (DRISDOL) 50000 units CAPS capsule Take 50,000 Units by mouth 2 (two) times a week. Tuesdays and Fridays     No current facility-administered medications for this visit.     Physical Exam BP 100/76   Pulse Marland Kitchen)  105   Resp 20   Ht 5\' 4"  (1.626 m)   Wt 118 lb (53.5 kg)   SpO2 98% Comment: RA  BMI 20.58 kg/m  67 year old woman in no acute distress Obvious discomfort with movement Incision well-healed Chest tube site clean and dry without erythema No airleak Lungs diminished on left side otherwise clear  Diagnostic Tests: CHEST - 2 VIEW  COMPARISON:  04/28/2018  FINDINGS: Left chest tube remains in place. Decreasing size of the left apical pneumothorax. Several show small air-fluid levels in the left apex compatible with loculated hydropneumothorax. Left base atelectasis. Heart is upper limits normal in size. Right lung clear.  IMPRESSION: Postoperative changes on the left and left chest tube in place. Decreasing size of the left apical pneumothorax with loculated hydropneumothorax noted at the left apex.   Electronically Signed   By:  Rolm Baptise M.D.   On: 05/05/2018 13:29 I personally reviewed the chest x-ray images and concur with the findings noted above  Impression: Valerie Mosley is a 67 year old woman who had a wedge resection for a stage Ia adenocarcinoma in the superior segment of her left lower lobe.  This was in a redo VATS setting.  She had a persistent air leak postoperatively.  Intrabronchial valves were placed in the air leak decreased but did not resolve.  She went home with a chest tube a couple of weeks ago.  I saw her in the office last week and there was still a leak.  Today there is no evidence of a leak.  Her apical space is smaller on chest x-ray.  Given the prolonged nature of her air leak I would like to observe her for a couple hours after removing her chest tube.  We will plan to do that in short stay tomorrow.  I will also provide [place] with facilities to replace the tube should it be necessary.  Plan: We will plan for chest tube removal and short stay tomorrow.  Return in 2 weeks with a PA and lateral chest x-ray.  We will discuss timing of bronchial valve removal at that visit  [name], MD [facility] [phone]

## 2018-05-06 ENCOUNTER — Ambulatory Visit (HOSPITAL_COMMUNITY): Payer: Medicare Other

## 2018-05-06 ENCOUNTER — Ambulatory Visit (HOSPITAL_COMMUNITY)
Admission: RE | Admit: 2018-05-06 | Discharge: 2018-05-06 | Disposition: A | Discharge status: Home / Self Care | Payer: Medicare Other | Source: Ambulatory Visit | Attending: Thoracic Surgery (Cardiothoracic Vascular Surgery) | Admitting: Thoracic Surgery (Cardiothoracic Vascular Surgery)

## 2018-05-06 ENCOUNTER — Encounter (HOSPITAL_COMMUNITY)
Admission: RE | Disposition: A | Discharge status: Home / Self Care | Payer: Self-pay | Source: Ambulatory Visit | Attending: Thoracic Surgery (Cardiothoracic Vascular Surgery)

## 2018-05-06 ENCOUNTER — Encounter (HOSPITAL_COMMUNITY): Payer: Self-pay | Admitting: Urology

## 2018-05-06 DIAGNOSIS — I10 Essential (primary) hypertension: Secondary | ICD-10-CM | POA: Insufficient documentation

## 2018-05-06 DIAGNOSIS — Z79899 Other long term (current) drug therapy: Secondary | ICD-10-CM | POA: Diagnosis not present

## 2018-05-06 DIAGNOSIS — J9382 Other air leak: Secondary | ICD-10-CM | POA: Insufficient documentation

## 2018-05-06 DIAGNOSIS — J449 Chronic obstructive pulmonary disease, unspecified: Secondary | ICD-10-CM | POA: Diagnosis not present

## 2018-05-06 DIAGNOSIS — Z87891 Personal history of nicotine dependence: Secondary | ICD-10-CM | POA: Diagnosis not present

## 2018-05-06 DIAGNOSIS — Z09 Encounter for follow-up examination after completed treatment for conditions other than malignant neoplasm: Secondary | ICD-10-CM

## 2018-05-06 DIAGNOSIS — J9811 Atelectasis: Secondary | ICD-10-CM | POA: Diagnosis not present

## 2018-05-06 DIAGNOSIS — J95812 Postprocedural air leak: Secondary | ICD-10-CM

## 2018-05-06 DIAGNOSIS — J948 Other specified pleural conditions: Secondary | ICD-10-CM | POA: Diagnosis not present

## 2018-05-06 HISTORY — PX: REMOVAL OF PLEURAL DRAINAGE CATHETER: SHX5080

## 2018-05-06 SURGERY — REMOVAL, CLOSED DRAINAGE CATHETER SYSTEM, PLEURAL
Anesthesia: Monitor Anesthesia Care | Laterality: Left

## 2018-05-06 MED ORDER — LIDOCAINE HCL (PF) 1 % IJ SOLN
INTRAMUSCULAR | Status: AC
  Filled 2018-05-06: qty 30

## 2018-05-06 NOTE — H&P (Signed)
[address]       [place] [address]             [phone]                HPI: Mrs. Lemmerman returns for a scheduled follow up visit  Victoriah Wilds is a 67 year old woman with a history of tobacco abuse and COPD.  She had a left upper lobectomy for stage I adenocarcinoma in 2013.  She recently was found to have a new adenocarcinoma in the superior segment of her left lower lobe.  I did a VATS and wedge resection on 04/08/2018.  She had a persistent air leak postoperatively.  Intrabronchial valve replaced on 04/16/2018.  Her leak still persisted and she was sent home with a chest tube on 04/24/2018.  Today she complains of more pain at the chest tube site and anterior to that.  She says it has been that way for a couple of days.  She has not noted any redness or drainage.  She says that she used to hear a "pop" when she coughed but has not for the past 3 days.      Past Medical History:  Diagnosis Date  . Cancer ([place])    Stage IA non-small cell lung cancer, left upper lobectomy 12/2011  . COPD (chronic obstructive pulmonary disease) ([place])   . Cough 07/29/11   started coughing up blood this am  . Cough   . Hypertension   . Kyphoscoliosis   . PONV (postoperative nausea and vomiting)   . Recurrent upper respiratory infection (URI)            Current Outpatient Medications  Medication Sig Dispense Refill  . acetaminophen (TYLENOL) 325 MG tablet Take 2 tablets (650 mg total) by mouth every 6 (six) hours as needed for moderate pain.    Marland Kitchen albuterol (VENTOLIN HFA) 108 (90 Base) MCG/ACT inhaler Inhale 2 puffs into the lungs every 4 (four) hours as needed for wheezing or shortness of breath. For shortness of breath 8.51 g 5  . alendronate (FOSAMAX) 70 MG tablet Take 70 mg by mouth once a week. Take on Friday with a full glass of water on an empty stomach.    Marland Kitchen amLODipine (NORVASC) 10 MG tablet Take 10 mg by mouth daily.      . colestipol (COLESTID)  1 g tablet Take 1 g by mouth daily.    . fluticasone (FLONASE) 50 MCG/ACT nasal spray instill 2 sprays into each nostril once daily (Patient taking differently: instill 2 sprays into each nostril once daily as needed for allergies) 16 g 5  . guaiFENesin (MUCINEX) 600 MG 12 hr tablet Take 1 tablet (600 mg total) by mouth 2 (two) times daily.    . montelukast (SINGULAIR) 10 MG tablet Take 1 tablet (10 mg total) by mouth at bedtime. 30 tablet 5  . sertraline (ZOLOFT) 50 MG tablet Take 50 mg by mouth at bedtime.    Marland Kitchen tiotropium (SPIRIVA HANDIHALER) 18 MCG inhalation capsule inhale the contents of one capsule in the handihaler once daily (Patient taking differently: Place 18 mcg into inhaler and inhale daily. ) 30 capsule 5  . traMADol (ULTRAM) 50 MG tablet Take 1 tablet (50 mg total) by mouth every 6 (six) hours as needed for moderate pain. 20 tablet 0  . Vitamin D, Ergocalciferol, (DRISDOL) 50000 units CAPS capsule Take 50,000 Units by mouth 2 (two) times a week. Tuesdays and Fridays  No current facility-administered medications for this visit.     Physical Exam BP 100/76   Pulse (!) 105   Resp 20   Ht 5\' 4"  (1.626 m)   Wt 118 lb (53.5 kg)   SpO2 98% Comment: RA  BMI 20.98 kg/m  67 year old woman in no acute distress Obvious discomfort with movement Incision well-healed Chest tube site clean and dry without erythema No airleak Lungs diminished on left side otherwise clear  Diagnostic Tests: CHEST - 2 VIEW  COMPARISON: 04/28/2018  FINDINGS: Left chest tube remains in place. Decreasing size of the left apical pneumothorax. Several show small air-fluid levels in the left apex compatible with loculated hydropneumothorax. Left base atelectasis. Heart is upper limits normal in size. Right lung clear.  IMPRESSION: Postoperative changes on the left and left chest tube in place. Decreasing size of the left apical pneumothorax with loculated hydropneumothorax noted at  the left apex.   Electronically Signed By: Rolm Baptise M.D. On: 05/05/2018 13:29 I personally reviewed the chest x-ray images and concur with the findings noted above  Impression: Mrs. Zeller is a 67 year old woman who had a wedge resection for a stage Ia adenocarcinoma in the superior segment of her left lower lobe.  This was in a redo VATS setting.  She had a persistent air leak postoperatively.  Intrabronchial valves were placed in the air leak decreased but did not resolve.  She went home with a chest tube a couple of weeks ago.  I saw her in the office last week and there was still a leak.  Today there is no evidence of a leak.  Her apical space is smaller on chest x-ray.  Given the prolonged nature of her air leak I would like to observe her for a couple hours after removing her chest tube.  We will plan to do that in short stay tomorrow.  I will also provide [place] with facilities to replace the tube should it be necessary.  Plan: We will plan for chest tube removal and short stay tomorrow.  Return in 2 weeks with a PA and lateral chest x-ray.  We will discuss timing of bronchial valve removal at that visit  [name], MD [facility] [phone]

## 2018-05-06 NOTE — Progress Notes (Signed)
Spoke with Evonnie Pat, PA.  Instructions repeated, office will call with f/u. She is in good spirits, no breathing difficulties, ready for discharge.

## 2018-05-06 NOTE — H&P (Signed)
      [address]       [place] [address]             [phone]     Recent H&P:   Ms. Chandra is a 67 year old female who was recently hospitalized for left VATS wedge resection on 04/08/2018 by Dr. Roxan Hockey.  She had a persistent air leak postoperatively.  Intrabronchial valve was placed on 04/16/2018.  He was sent home on 04/24/2018 with a chest tube and air leak.  He was seen yesterday in the office by Dr. Roxan Hockey who felt the tube can be removed at this time and she was brought into the short stay area for the procedure.  He denies significant shortness of breath.   Gen: alert and NAD Lungs: dim left base Cor: RRR Abd : benign Ext: no edema Incis: healing well  Procedure: The left chest tube was removed using standard technique.  She tolerated it well.  Will obtain x-ray in 1 hour.  We will observe for a total of at least 2 hours to see if she becomes symptomatic.   [name], PA-C

## 2018-05-07 ENCOUNTER — Encounter (HOSPITAL_COMMUNITY): Payer: Self-pay | Admitting: Thoracic Surgery (Cardiothoracic Vascular Surgery)

## 2018-05-07 DIAGNOSIS — C3492 Malignant neoplasm of unspecified part of left bronchus or lung: Secondary | ICD-10-CM | POA: Diagnosis not present

## 2018-05-07 DIAGNOSIS — F329 Major depressive disorder, single episode, unspecified: Secondary | ICD-10-CM | POA: Diagnosis not present

## 2018-05-07 DIAGNOSIS — I10 Essential (primary) hypertension: Secondary | ICD-10-CM | POA: Diagnosis not present

## 2018-05-08 DIAGNOSIS — C3432 Malignant neoplasm of lower lobe, left bronchus or lung: Secondary | ICD-10-CM | POA: Diagnosis not present

## 2018-05-08 DIAGNOSIS — J449 Chronic obstructive pulmonary disease, unspecified: Secondary | ICD-10-CM | POA: Diagnosis not present

## 2018-05-08 DIAGNOSIS — Z438 Encounter for attention to other artificial openings: Secondary | ICD-10-CM | POA: Diagnosis not present

## 2018-05-08 DIAGNOSIS — I10 Essential (primary) hypertension: Secondary | ICD-10-CM | POA: Diagnosis not present

## 2018-05-08 DIAGNOSIS — Z87891 Personal history of nicotine dependence: Secondary | ICD-10-CM | POA: Diagnosis not present

## 2018-05-12 DIAGNOSIS — J449 Chronic obstructive pulmonary disease, unspecified: Secondary | ICD-10-CM | POA: Diagnosis not present

## 2018-05-12 DIAGNOSIS — I10 Essential (primary) hypertension: Secondary | ICD-10-CM | POA: Diagnosis not present

## 2018-05-12 DIAGNOSIS — Z438 Encounter for attention to other artificial openings: Secondary | ICD-10-CM | POA: Diagnosis not present

## 2018-05-12 DIAGNOSIS — C3432 Malignant neoplasm of lower lobe, left bronchus or lung: Secondary | ICD-10-CM | POA: Diagnosis not present

## 2018-05-12 DIAGNOSIS — Z87891 Personal history of nicotine dependence: Secondary | ICD-10-CM | POA: Diagnosis not present

## 2018-05-13 ENCOUNTER — Ambulatory Visit (INDEPENDENT_AMBULATORY_CARE_PROVIDER_SITE_OTHER): Payer: Self-pay | Admitting: *Deleted

## 2018-05-13 DIAGNOSIS — Z902 Acquired absence of lung [part of]: Secondary | ICD-10-CM

## 2018-05-13 DIAGNOSIS — Z4802 Encounter for removal of sutures: Secondary | ICD-10-CM

## 2018-05-13 DIAGNOSIS — C3492 Malignant neoplasm of unspecified part of left bronchus or lung: Secondary | ICD-10-CM

## 2018-05-13 NOTE — Progress Notes (Signed)
Valerie Mosley returns for suture removal of one previous chest tube site. Her chest tube for persistent air leak s/p LLLobectomy was removed on 05/06/18. The site is slightly red but consistent with suture irritation.  She will return next week with a chest xray. At that time Dr. Roxan Hockey will discuss timing of the removal of the bronchial valves.

## 2018-05-14 DIAGNOSIS — Z438 Encounter for attention to other artificial openings: Secondary | ICD-10-CM | POA: Diagnosis not present

## 2018-05-14 DIAGNOSIS — I10 Essential (primary) hypertension: Secondary | ICD-10-CM | POA: Diagnosis not present

## 2018-05-14 DIAGNOSIS — Z87891 Personal history of nicotine dependence: Secondary | ICD-10-CM | POA: Diagnosis not present

## 2018-05-14 DIAGNOSIS — C3432 Malignant neoplasm of lower lobe, left bronchus or lung: Secondary | ICD-10-CM | POA: Diagnosis not present

## 2018-05-14 DIAGNOSIS — J449 Chronic obstructive pulmonary disease, unspecified: Secondary | ICD-10-CM | POA: Diagnosis not present

## 2018-05-15 ENCOUNTER — Other Ambulatory Visit: Payer: Self-pay | Admitting: Thoracic Surgery (Cardiothoracic Vascular Surgery)

## 2018-05-15 DIAGNOSIS — C3490 Malignant neoplasm of unspecified part of unspecified bronchus or lung: Secondary | ICD-10-CM

## 2018-05-19 ENCOUNTER — Ambulatory Visit (INDEPENDENT_AMBULATORY_CARE_PROVIDER_SITE_OTHER): Payer: Self-pay | Admitting: Thoracic Surgery (Cardiothoracic Vascular Surgery)

## 2018-05-19 ENCOUNTER — Ambulatory Visit
Admission: RE | Admit: 2018-05-19 | Discharge: 2018-05-19 | Disposition: A | Discharge status: Home / Self Care | Payer: 59 | Source: Ambulatory Visit | Attending: Thoracic Surgery (Cardiothoracic Vascular Surgery) | Admitting: Thoracic Surgery (Cardiothoracic Vascular Surgery)

## 2018-05-19 ENCOUNTER — Other Ambulatory Visit: Payer: Self-pay

## 2018-05-19 ENCOUNTER — Encounter: Payer: Self-pay | Admitting: Thoracic Surgery (Cardiothoracic Vascular Surgery)

## 2018-05-19 VITALS — BP 100/68 | HR 96 | Resp 18 | Ht 64.0 in | Wt 116.6 lb

## 2018-05-19 DIAGNOSIS — C3492 Malignant neoplasm of unspecified part of left bronchus or lung: Secondary | ICD-10-CM

## 2018-05-19 DIAGNOSIS — C3490 Malignant neoplasm of unspecified part of unspecified bronchus or lung: Secondary | ICD-10-CM

## 2018-05-19 NOTE — Progress Notes (Signed)
[address]       [place] [address]             [phone]      HPI: Valerie Mosley returns for scheduled follow-up visit  Valerie Mosley is a 67 year old woman with a history of tobacco abuse and COPD.  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  She developed a new nodule in the superior segment of her left lower lobe.  Needle biopsy showed adenocarcinoma.  She preferred surgery of radiation therapy I did a wedge resection on 04/08/2018.  That was complicated by persistent air leak.  Intrabronchial valves were placed on 04/16/2018 but she continued to have a leak.  She went home with a chest tube on 04/24/2018.  I saw her in the office on 05/05/2018 at that time and there was no evidence of an air leak.  Her tube was removed on the 21st.  She has not had any problems with her breathing since then.  She does still have some soreness with her incisions and is not able to wear a bra.  She is not taking any narcotics.  She is using Tylenol occasionally for the pain.  She has been driving without problems.  Past Medical History:  Diagnosis Date  . Cancer ([place])    Stage IA non-small cell lung cancer, left upper lobectomy 12/2011  . COPD (chronic obstructive pulmonary disease) ([place])   . Cough 07/29/11   started coughing up blood this am  . Cough   . Hypertension   . Kyphoscoliosis   . PONV (postoperative nausea and vomiting)   . Recurrent upper respiratory infection (URI)     Current Outpatient Medications  Medication Sig Dispense Refill  . acetaminophen (TYLENOL) 500 MG tablet Take 1,000 mg by mouth every 6 (six) hours as needed for moderate pain or headache.    . albuterol (VENTOLIN HFA) 108 (90 Base) MCG/ACT inhaler Inhale 2 puffs into the lungs every 4 (four) hours as needed for wheezing or shortness of breath. For shortness of breath 8.51 g 5  . alendronate (FOSAMAX) 70 MG tablet Take 70 mg by mouth every Friday.     Marland Kitchen amLODipine (NORVASC)  10 MG tablet Take 10 mg by mouth daily.      . colestipol (COLESTID) 1 g tablet Take 1 g by mouth daily.    . fluticasone (FLONASE) 50 MCG/ACT nasal spray instill 2 sprays into each nostril once daily (Patient taking differently: Place 2 sprays into both nostrils daily as needed for allergies. ) 16 g 5  . guaiFENesin (MUCINEX) 600 MG 12 hr tablet Take 1 tablet (600 mg total) by mouth 2 (two) times daily.    . montelukast (SINGULAIR) 10 MG tablet Take 1 tablet (10 mg total) by mouth at bedtime. 30 tablet 5  . sertraline (ZOLOFT) 50 MG tablet Take 50 mg by mouth at bedtime.    Marland Kitchen tiotropium (SPIRIVA HANDIHALER) 18 MCG inhalation capsule inhale the contents of one capsule in the handihaler once daily (Patient taking differently: Place 18 mcg into inhaler and inhale daily. ) 30 capsule 5  . traMADol (ULTRAM) 50 MG tablet Take 1 tablet (50 mg total) by mouth every 6 (six) hours as needed for moderate pain. 20 tablet 0  . Vitamin D, Ergocalciferol, (DRISDOL) 50000 units CAPS capsule Take 50,000 Units by mouth See admin instructions. Take 50000 units by mouth twice weekly on Tuesday and Friday  No current facility-administered medications for this visit.     Physical Exam BP 100/68   Pulse 96   Resp 18   Ht 5\' 4"  (1.626 m)   Wt 116 lb 9.6 oz (52.9 kg)   SpO2 96% Comment: RA  BMI 20.70 kg/m  67 year old woman in no acute distress Alert and oriented x3 with no focal deficits Lungs with faint wheezing bilaterally Incisions healing well  Diagnostic Tests: CHEST - 2 VIEW  COMPARISON:  Chest radiograph 05/06/2018  Chest CT 02/10/2018, 03/06/2018  FINDINGS: Status post left upper lobectomy with unchanged air and fluid collection at the left apical pleura. Left hemidiaphragm elevation and atelectasis are unchanged. The lungs are hyperinflated with diffuse interstitial coarsening. No focal consolidation. No sizable pleural effusion.  IMPRESSION: Status post left upper lobectomy with  unchanged left apical pleural fluid and air.   Electronically Signed   By: Ulyses Jarred M.D.   On: 05/19/2018 13:36 I personally reviewed the chest x-ray images and concur with the findings noted above  Impression: Valerie Mosley is a 67 year old woman with a history of tobacco abuse who recently had a redo left VATS for wedge resection of a second primary adenocarcinoma.  This was a T1, N0(x) lesion.  This was complicated by a prolonged air leak.  IBV were placed but had relatively minimal effect.  Her air leak eventually resolved and she was able to have the chest tube removed about 2 weeks ago.  Her chest x-ray today shows no issues post chest tube removal.  There is a small airspace apically.  Surgical standpoint she is healing well.  She does still have some soreness which is to be expected.  She is not requiring any narcotics.  There are no restrictions on her activities at this time, but she was advised to increase her activities gradually.  Since this is a second lesion I think would be best to have her evaluated in our multidisciplinary thoracic oncology clinic to see if oncology or radiation oncology would recommend any adjuvant treatment.  I do not think will be necessary.  She does need the intrabronchial valves removed.  I prefer to leave those for 8 weeks before removing them.  That would be early October.  She has a wedding the first week end of October.  So we will tentatively plan to remove them on Thursday, 06/25/2018  Plan: Referral to [facility] to see oncology. Bronchoscopy for intrabronchial valve removal on 06/25/2018  Valerie Nakayama, MD [facility] [phone]

## 2018-05-20 ENCOUNTER — Other Ambulatory Visit: Payer: Self-pay | Admitting: *Deleted

## 2018-05-20 ENCOUNTER — Encounter: Payer: Self-pay | Admitting: *Deleted

## 2018-05-20 DIAGNOSIS — J95812 Postprocedural air leak: Secondary | ICD-10-CM

## 2018-05-20 NOTE — Progress Notes (Signed)
Oncology Nurse Navigator Documentation  Oncology Nurse Navigator Flowsheets 05/20/2018  Navigator Location [facility]  Referral date to RadOnc/MedOnc 05/20/2018  Navigator Encounter Type Other/I received referral from Dr. Leonarda Salon office. I updated scheduling team to call and schedule patient to be seen on 06/02/18 labs at 85 and Dr. Julien Nordmann at 11:30  Barriers/Navigation Needs Coordination of Care  Interventions Coordination of Care  Coordination of Care Other  Acuity Level 2  Time Spent with Patient 15

## 2018-05-21 ENCOUNTER — Encounter: Payer: Self-pay | Admitting: Internal Medicine

## 2018-05-21 ENCOUNTER — Telehealth: Payer: Self-pay | Admitting: Internal Medicine

## 2018-05-21 NOTE — Telephone Encounter (Signed)
New referral from Dr. Roxan Hockey at [facility] for non-small cell adenocarcinoma of lung. Pt has been scheduled to see Dr. Julien Nordmann on 9/17 at 215pm. I cld and lft the appt info on the pt's vm. Letter mailed.

## 2018-06-01 ENCOUNTER — Other Ambulatory Visit: Payer: Self-pay | Admitting: Medical Oncology

## 2018-06-01 DIAGNOSIS — C3492 Malignant neoplasm of unspecified part of left bronchus or lung: Secondary | ICD-10-CM

## 2018-06-02 ENCOUNTER — Encounter: Payer: Self-pay | Admitting: Internal Medicine

## 2018-06-02 ENCOUNTER — Inpatient Hospital Stay: Payer: Medicare Other

## 2018-06-02 ENCOUNTER — Other Ambulatory Visit: Payer: Self-pay | Admitting: Internal Medicine

## 2018-06-02 ENCOUNTER — Telehealth: Payer: Self-pay | Admitting: Internal Medicine

## 2018-06-02 ENCOUNTER — Inpatient Hospital Stay: Payer: Medicare Other | Attending: Internal Medicine | Admitting: Internal Medicine

## 2018-06-02 VITALS — BP 123/88 | HR 96 | Temp 98.2°F | Resp 18 | Ht 64.0 in | Wt 117.4 lb

## 2018-06-02 DIAGNOSIS — C3432 Malignant neoplasm of lower lobe, left bronchus or lung: Secondary | ICD-10-CM

## 2018-06-02 DIAGNOSIS — C3490 Malignant neoplasm of unspecified part of unspecified bronchus or lung: Secondary | ICD-10-CM

## 2018-06-02 DIAGNOSIS — Z87891 Personal history of nicotine dependence: Secondary | ICD-10-CM | POA: Diagnosis not present

## 2018-06-02 DIAGNOSIS — C3492 Malignant neoplasm of unspecified part of left bronchus or lung: Secondary | ICD-10-CM

## 2018-06-02 DIAGNOSIS — J449 Chronic obstructive pulmonary disease, unspecified: Secondary | ICD-10-CM | POA: Diagnosis not present

## 2018-06-02 DIAGNOSIS — I10 Essential (primary) hypertension: Secondary | ICD-10-CM

## 2018-06-02 DIAGNOSIS — Z720 Tobacco use: Secondary | ICD-10-CM

## 2018-06-02 LAB — CMP (CANCER CENTER ONLY)
ALT: 11 U/L (ref 0–44)
AST: 14 U/L — ABNORMAL LOW (ref 15–41)
Albumin: 3.9 g/dL (ref 3.5–5.0)
Alkaline Phosphatase: 123 U/L (ref 38–126)
Anion gap: 9 (ref 5–15)
BUN: 17 mg/dL (ref 8–23)
CO2: 25 mmol/L (ref 22–32)
Calcium: 9.4 mg/dL (ref 8.9–10.3)
Chloride: 104 mmol/L (ref 98–111)
Creatinine: 0.72 mg/dL (ref 0.44–1.00)
GFR, Est AFR Am: >60 mL/min (ref >60)
GFR, Estimated: >60 mL/min (ref >60)
Glucose, Bld: 85 mg/dL (ref 70–99)
Potassium: 4.3 mmol/L (ref 3.5–5.1)
Sodium: 138 mmol/L (ref 135–145)
Total Bilirubin: 0.4 mg/dL (ref 0.3–1.2)
Total Protein: 7.2 g/dL (ref 6.5–8.1)

## 2018-06-02 LAB — CBC WITH DIFFERENTIAL (CANCER CENTER ONLY)
Basophils Absolute: 0.1 10*3/uL (ref 0.0–0.1)
Basophils Relative: 1 %
Eosinophils Absolute: 0.2 10*3/uL (ref 0.0–0.5)
Eosinophils Relative: 1 %
HCT: 42 % (ref 34.8–46.6)
Hemoglobin: 14 g/dL (ref 11.6–15.9)
Lymphocytes Relative: 16 %
Lymphs Abs: 2 10*3/uL (ref 0.9–3.3)
MCH: 30.4 pg (ref 25.1–34.0)
MCHC: 33.3 g/dL (ref 31.5–36.0)
MCV: 91.3 fL (ref 79.5–101.0)
Monocytes Absolute: 0.8 10*3/uL (ref 0.1–0.9)
Monocytes Relative: 6 %
Neutro Abs: 9.9 10*3/uL — ABNORMAL HIGH (ref 1.5–6.5)
Neutrophils Relative %: 76 %
Platelet Count: 277 10*3/uL (ref 145–400)
RBC: 4.6 MIL/uL (ref 3.70–5.45)
RDW: 14.1 % (ref 11.2–14.5)
WBC Count: 12.9 10*3/uL — ABNORMAL HIGH (ref 3.9–10.3)

## 2018-06-02 NOTE — Telephone Encounter (Signed)
appts scheduled avs calendar printed per 9/17 los °

## 2018-06-02 NOTE — Progress Notes (Signed)
[facility] [phone]   [phone]  CONSULT NOTE  REFERRING PHYSICIAN: Dr. Modesto Charon  REASON FOR CONSULTATION:  67 years old white female recently diagnosed with lung cancer.  HPI Valerie Mosley is a 67 y.o. female with past medical history significant for COPD, hypertension, recurrent upper respiratory infection as well as history of a stage Ia non-small cell lung cancer, adenocarcinoma status post left upper lobectomy in April 2013 under the care of Dr. Roxan Hockey.  The patient was followed by serial CT imaging after her surgery in 2013.  CT scan of the chest performed on 02/10/2018 showed enlarging irregular pleural-based solid 1.9 cm pulmonary nodule located in the superior segment of the left lower lobe.  This was followed by a PET scan on 02/18/2018 and that showed hypermetabolic apical nodule in the superior segment of the left lower lobe but no evidence of distant metastatic disease.  The patient underwent CT-guided core biopsy of the left lower lobe lung nodule by interventional radiology and the final pathology was consistent with adenocarcinoma. On April 05, 2018 the patient underwent redo left VATS with wedge resection of the left lower lobe nodule under the care of Dr. Roxan Hockey.  The final pathology [id number]) showed invasive moderately differentiated adenocarcinoma spanning 1.3 cm with adenocarcinoma involving the visceral pleura as well as focal lymphovascular invasion.  The dissected intrapulmonary lymph node was negative for malignancy. Her surgery was complicated by air leak and the patient had a intrabronchial valves placed on April 16, 2018. She spent around 11 days in the hospital.  She is now feeling fine with no concerning complaints. Dr. Roxan Hockey kindly referred the patient to me today for evaluation and recommendation regarding treatment of her condition. When seen today she is feeling fine except for mild cough.  She  denied having any chest pain, shortness of breath or hemoptysis.  She denied having any recent weight loss or night sweats.  She has no headache or visual changes.  The patient denied having any nausea, vomiting, diarrhea or constipation. Family history significant for mother died from brain aneurysm, father had melanoma, brother had brain cancer. The patient is married and has 1 son.  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  She has a history for smoking 1 pack/day for around 30 years and quit in April 2013.  She also drinks 2 beers every day.  She has no history of drug abuse.  HPI  Past Medical History:  Diagnosis Date  . Cancer ([place])    Stage IA non-small cell lung cancer, left upper lobectomy 12/2011  . COPD (chronic obstructive pulmonary disease) ([place])   . Cough 07/29/11   started coughing up blood this am  . Cough   . Hypertension   . Kyphoscoliosis   . PONV (postoperative nausea and vomiting)   . Recurrent upper respiratory infection (URI)     Past Surgical History:  Procedure Laterality Date  . BRONCHOSCOPY    . INSERTION OF IBV VALVE Left 04/16/2018   Procedure: INSERTION OF INTERBRONCHIAL VALVE (IBV);  Surgeon: [name], MD;  Location: [place];  Service: Thoracic;  Laterality: Left;  . LOBECTOMY  12/17/2011   Procedure: LOBECTOMY;  Surgeon: [name], MD;  Location: [facility] OR;  Service: Thoracic;  Laterality: Left;  (L)VATS, WEDGE RESECTION, LEFT UPPER LOBECTOMY,  Multiple node biopsies  . REMOVAL OF PLEURAL DRAINAGE CATHETER Left 05/06/2018   Procedure: REMOVAL OF CHEST TUBE;  Surgeon: [name], MD;  Location:  [facility] OR;  Service: Thoracic;  Laterality: Left;  . THYROID SURGERY  1980   1/2 thyroid removed - benign  . URETHRA SURGERY    . VIDEO ASSISTED THORACOSCOPY Left 04/08/2018   Procedure: REDO LEFT VIDEO ASSISTED THORACOSCOPY with left lower lobe wedge resection;  Surgeon: [name], MD;  Location: [place];  Service: Thoracic;  Laterality:  Left;  Marland Kitchen VIDEO BRONCHOSCOPY N/A 04/16/2018   Procedure: VIDEO BRONCHOSCOPY;  Surgeon: [name], MD;  Location: [place];  Service: Thoracic;  Laterality: N/A;    History reviewed. No pertinent family history.  Social History Social History   Tobacco Use  . Smoking status: Former Smoker    Packs/day: 0.50    Years: 40.00    Pack years: 20.00    Types: Cigarettes    Last attempt to quit: 12/16/2011    Years since quitting: 6.4  . Smokeless tobacco: Never Used  Substance Use Topics  . Alcohol use: Yes    Alcohol/week: 1.0 standard drinks    Types: 1 Cans of beer per week  . Drug use: No    No Known Allergies  Current Outpatient Medications  Medication Sig Dispense Refill  . acetaminophen (TYLENOL) 500 MG tablet Take 1,000 mg by mouth every 6 (six) hours as needed for moderate pain or headache.    Marland Kitchen amLODipine (NORVASC) 10 MG tablet Take 10 mg by mouth daily.      . colestipol (COLESTID) 1 g tablet Take 1 g by mouth daily.    . fluticasone (FLONASE) 50 MCG/ACT nasal spray instill 2 sprays into each nostril once daily (Patient taking differently: Place 2 sprays into both nostrils daily as needed for allergies. ) 16 g 5  . guaiFENesin (MUCINEX) 600 MG 12 hr tablet Take 1 tablet (600 mg total) by mouth 2 (two) times daily.    . montelukast (SINGULAIR) 10 MG tablet Take 1 tablet (10 mg total) by mouth at bedtime. 30 tablet 5  . sertraline (ZOLOFT) 50 MG tablet Take 50 mg by mouth at bedtime.    Marland Kitchen tiotropium (SPIRIVA HANDIHALER) 18 MCG inhalation capsule inhale the contents of one capsule in the handihaler once daily (Patient taking differently: Place 18 mcg into inhaler and inhale daily. ) 30 capsule 5  . albuterol (VENTOLIN HFA) 108 (90 Base) MCG/ACT inhaler Inhale 2 puffs into the lungs every 4 (four) hours as needed for wheezing or shortness of breath. For shortness of breath (Patient not taking: Reported on 06/02/2018) 8.51 g 5  . alendronate (FOSAMAX) 70 MG tablet Take 70 mg  by mouth every Friday.     . traMADol (ULTRAM) 50 MG tablet Take 1 tablet (50 mg total) by mouth every 6 (six) hours as needed for moderate pain. (Patient not taking: Reported on 06/02/2018) 20 tablet 0  . Vitamin D, Ergocalciferol, (DRISDOL) 50000 units CAPS capsule Take 50,000 Units by mouth See admin instructions. Take 50000 units by mouth twice weekly on Tuesday and Friday     No current facility-administered medications for this visit.     Review of Systems  Constitutional: positive for fatigue Eyes: negative Ears, nose, mouth, throat, and face: negative Respiratory: positive for cough Cardiovascular: negative Gastrointestinal: negative Genitourinary:negative Integument/breast: negative Hematologic/lymphatic: negative Musculoskeletal:negative Neurological: negative Behavioral/Psych: negative Endocrine: negative Allergic/Immunologic: negative  Physical Exam  MWN:UUVOZ, healthy, no distress, well nourished and well developed SKIN: skin color, texture, turgor are normal, no rashes or significant lesions HEAD: Normocephalic, No masses, lesions, tenderness or abnormalities EYES: normal, PERRLA,  Conjunctiva are pink and non-injected EARS: External ears normal, Canals clear OROPHARYNX:no exudate, no erythema and lips, buccal mucosa, and tongue normal  NECK: supple, no adenopathy, no JVD LYMPH:  no palpable lymphadenopathy, no hepatosplenomegaly BREAST:not examined LUNGS: clear to auscultation , and palpation HEART: regular rate & rhythm, no murmurs and no gallops ABDOMEN:abdomen soft, non-tender, normal bowel sounds and no masses or organomegaly BACK: Back symmetric, no curvature., No CVA tenderness EXTREMITIES:no joint deformities, effusion, or inflammation, no edema  NEURO: alert & oriented x 3 with fluent speech, no focal motor/sensory deficits  PERFORMANCE STATUS: ECOG 1  LABORATORY DATA: Lab Results  Component Value Date   WBC 12.9 (H) 06/02/2018   HGB 14.0  06/02/2018   HCT 42.0 06/02/2018   MCV 91.3 06/02/2018   PLT 277 06/02/2018      Chemistry      Component Value Date/Time   NA 134 (L) 04/21/2018 0303   K 4.1 04/21/2018 0303   CL 101 04/21/2018 0303   CO2 26 04/21/2018 0303   BUN 14 04/21/2018 0303   CREATININE 0.60 04/21/2018 0303      Component Value Date/Time   CALCIUM 7.9 (L) 04/21/2018 0303   ALKPHOS 82 04/10/2018 0407   AST 18 04/10/2018 0407   ALT 13 04/10/2018 0407   BILITOT 0.7 04/10/2018 0407       RADIOGRAPHIC STUDIES: Dg Chest 2 View  Result Date: 05/19/2018 CLINICAL DATA:  Left-sided lung carcinoma status post resection. EXAM: CHEST - 2 VIEW COMPARISON:  Chest radiograph 05/06/2018 Chest CT 02/10/2018, 03/06/2018 FINDINGS: Status post left upper lobectomy with unchanged air and fluid collection at the left apical pleura. Left hemidiaphragm elevation and atelectasis are unchanged. The lungs are hyperinflated with diffuse interstitial coarsening. No focal consolidation. No sizable pleural effusion. IMPRESSION: Status post left upper lobectomy with unchanged left apical pleural fluid and air. Electronically Signed   By: Ulyses Jarred M.D.   On: 05/19/2018 13:36   Dg Chest 2 View  Result Date: 05/05/2018 CLINICAL DATA:  VATS, COPD, shortness of breath EXAM: CHEST - 2 VIEW COMPARISON:  04/28/2018 FINDINGS: Left chest tube remains in place. Decreasing size of the left apical pneumothorax. Several show small air-fluid levels in the left apex compatible with loculated hydropneumothorax. Left base atelectasis. Heart is upper limits normal in size. Right lung clear. IMPRESSION: Postoperative changes on the left and left chest tube in place. Decreasing size of the left apical pneumothorax with loculated hydropneumothorax noted at the left apex. Electronically Signed   By: Rolm Baptise M.D.   On: 05/05/2018 13:29   Dg Chest Port 1 View  Result Date: 05/06/2018 CLINICAL DATA:  Chest tube removal. EXAM: PORTABLE CHEST 1 VIEW  COMPARISON:  05/05/2018 FINDINGS: Previous left chest tube has been removed. No change in the small amount of pleural air at the apex. Mild left base atelectasis as seen previously. Right lung is clear. IMPRESSION: Left chest tube removed. No change in the amount of pleural air at the left apex. No significant change in left base atelectasis. Electronically Signed   By: Nelson Chimes M.D.   On: 05/06/2018 13:30    ASSESSMENT: This is a very pleasant 67 years old white female with history of a stage IA, T1 a, N0, M0) non-small cell lung cancer, adenocarcinoma status post right upper lobectomy in April 2013 and recently presented with stage Ib (T2, and 0, M0) non-small cell lung cancer, adenocarcinoma measuring 1.3 cm with visceral pleural invasion and focal lymphovascular invasion diagnosed and  June 2019.  She status post left lower lobe superior segmentectomy under the care of Dr. Roxan Hockey on April 08, 2018.   PLAN: I had a lengthy discussion with the patient today about her current condition and treatment options. The patient has no evidence of metastatic disease to the lymph node or distant metastasis. I explained to the patient that there is no survival benefit for adjuvant systemic chemotherapy for patient with a stage Ib with tumor size less than 4.0 cm. I recommended for the patient to continue on observation with repeat CT scan of the chest in 6 months. She was advised to call immediately if she has any concerning symptoms in the interval. The patient voices understanding of current disease status and treatment options and is in agreement with the current care plan.  All questions were answered. The patient knows to call the clinic with any problems, questions or concerns. We can certainly see the patient much sooner if necessary.  Thank you so much for allowing me to participate in the care of Valerie Mosley. I will continue to follow up the patient with you and assist in her care.  I  spent 40 minutes counseling the patient face to face. The total time spent in the appointment was 60 minutes.  Disclaimer: This note was dictated with voice recognition software. Similar sounding words can inadvertently be transcribed and may not be corrected upon review.   Eilleen Kempf June 02, 2018, 2:18 PM

## 2018-06-16 NOTE — Pre-Procedure Instructions (Signed)
Valerie Mosley  06/16/2018      [facility] [place], [address] [place] [address] Phone: [phone] Fax: [phone]    Your procedure is scheduled on October 10th.  Report to [facility] Admitting at 0600 A.M.  Call this number if you have problems the morning of surgery:  [phone]   Remember:  Do not eat or drink after midnight.     Take these medicines the morning of surgery with A SIP OF WATER   Tylenol (if needed)  Albuterol Inhaler (if needed) Bring inhaler with you  Norvasc  Flonase (if needed)  Spiriva (if needed)  7 days prior to surgery STOP taking any Aspirin(unless otherwise instructed by your surgeon), Aleve, Naproxen, Ibuprofen, Motrin, Advil, Goody's, BC's, all herbal medications, fish oil, and all vitamins     Do not wear jewelry, make-up or nail polish.  Do not wear lotions, powders, or perfumes, or deodorant.  Do not shave 48 hours prior to surgery.  Men may shave face and neck.  Do not bring valuables to the hospital.  [facility] is not responsible for any belongings or valuables.  Contacts, dentures or bridgework may not be worn into surgery.  Leave your suitcase in the car.  After surgery it may be brought to your room.  For patients admitted to the hospital, discharge time will be determined by your treatment team.  Patients discharged the day of surgery will not be allowed to drive home.    [facility]- Preparing For Surgery  Before surgery, you can play an important role. Because skin is not sterile, your skin needs to be as free of germs as possible. You can reduce the number of germs on your skin by washing with CHG (chlorahexidine gluconate) Soap before surgery.  CHG is an antiseptic cleaner which kills germs and bonds with the skin to continue killing germs even after washing.    Oral Hygiene is also important to reduce your risk of infection.  Remember - BRUSH  YOUR TEETH THE MORNING OF SURGERY WITH YOUR REGULAR TOOTHPASTE  Please do not use if you have an allergy to CHG or antibacterial soaps. If your skin becomes reddened/irritated stop using the CHG.  Do not shave (including legs and underarms) for at least 48 hours prior to first CHG shower. It is OK to shave your face.  Please follow these instructions carefully.   1. Shower the NIGHT BEFORE SURGERY and the MORNING OF SURGERY with CHG.   2. If you chose to wash your hair, wash your hair first as usual with your normal shampoo.  3. After you shampoo, rinse your hair and body thoroughly to remove the shampoo.  4. Use CHG as you would any other liquid soap. You can apply CHG directly to the skin and wash gently with a scrungie or a clean washcloth.   5. Apply the CHG Soap to your body ONLY FROM THE NECK DOWN.  Do not use on open wounds or open sores. Avoid contact with your eyes, ears, mouth and genitals (private parts). Wash Face and genitals (private parts)  with your normal soap.  6. Wash thoroughly, paying special attention to the area where your surgery will be performed.  7. Thoroughly rinse your body with warm water from the neck down.  8. DO NOT shower/wash with your normal soap after using and rinsing off the CHG Soap.  9. Pat yourself dry  with a CLEAN TOWEL.  10. Wear CLEAN PAJAMAS to bed the night before surgery, wear comfortable clothes the morning of surgery  11. Place CLEAN SHEETS on your bed the night of your first shower and DO NOT SLEEP WITH PETS.    Day of Surgery:  Do not apply any deodorants/lotions.  Please wear clean clothes to the hospital/surgery center.   Remember to brush your teeth WITH YOUR REGULAR TOOTHPASTE.    Please read over the following fact sheets that you were given.

## 2018-06-17 ENCOUNTER — Encounter (HOSPITAL_COMMUNITY): Payer: Self-pay

## 2018-06-17 ENCOUNTER — Encounter (HOSPITAL_COMMUNITY)
Admission: RE | Admit: 2018-06-17 | Discharge: 2018-06-17 | Disposition: A | Discharge status: Home / Self Care | Payer: Medicare Other | Source: Ambulatory Visit | Attending: Thoracic Surgery (Cardiothoracic Vascular Surgery) | Admitting: Thoracic Surgery (Cardiothoracic Vascular Surgery)

## 2018-06-17 DIAGNOSIS — Y838 Other surgical procedures as the cause of abnormal reaction of the patient, or of later complication, without mention of misadventure at the time of the procedure: Secondary | ICD-10-CM | POA: Diagnosis not present

## 2018-06-17 DIAGNOSIS — J95812 Postprocedural air leak: Secondary | ICD-10-CM | POA: Insufficient documentation

## 2018-06-17 DIAGNOSIS — Z01818 Encounter for other preprocedural examination: Secondary | ICD-10-CM | POA: Diagnosis not present

## 2018-06-17 LAB — COMPREHENSIVE METABOLIC PANEL
ALT: 14 U/L (ref 0–44)
AST: 20 U/L (ref 15–41)
Albumin: 3.7 g/dL (ref 3.5–5.0)
Alkaline Phosphatase: 108 U/L (ref 38–126)
Anion gap: 8 (ref 5–15)
BUN: 19 mg/dL (ref 8–23)
CO2: 23 mmol/L (ref 22–32)
Calcium: 9 mg/dL (ref 8.9–10.3)
Chloride: 105 mmol/L (ref 98–111)
Creatinine, Ser: 0.6 mg/dL (ref 0.44–1.00)
GFR calc Af Amer: >60 mL/min (ref >60)
GFR calc non Af Amer: >60 mL/min (ref >60)
Glucose, Bld: 90 mg/dL (ref 70–99)
Potassium: 3.6 mmol/L (ref 3.5–5.1)
Sodium: 136 mmol/L (ref 135–145)
Total Bilirubin: 0.4 mg/dL (ref 0.3–1.2)
Total Protein: 6.6 g/dL (ref 6.5–8.1)

## 2018-06-17 LAB — SURGICAL PCR SCREEN
MRSA, PCR: NEGATIVE
Staphylococcus aureus: NEGATIVE

## 2018-06-17 LAB — CBC
HCT: 42.5 % (ref 36.0–46.0)
Hemoglobin: 13.7 g/dL (ref 12.0–15.0)
MCH: 30.4 pg (ref 26.0–34.0)
MCHC: 32.2 g/dL (ref 30.0–36.0)
MCV: 94.4 fL (ref 78.0–100.0)
Platelets: 407 10*3/uL — ABNORMAL HIGH (ref 150–400)
RBC: 4.5 MIL/uL (ref 3.87–5.11)
RDW: 13.8 % (ref 11.5–15.5)
WBC: 8.6 10*3/uL (ref 4.0–10.5)

## 2018-06-17 LAB — APTT: aPTT: 32 seconds (ref 24–36)

## 2018-06-17 LAB — PROTIME-INR
INR: 0.96
Prothrombin Time: 12.7 seconds (ref 11.4–15.2)

## 2018-06-17 NOTE — Progress Notes (Signed)
PCP: Dr. Tamala Julian Cardiologist: Denies  EKG: 03/2018 CXR: DOS ECHO: Denies Stress Test: Denies Cardiac Cath: Denies   Patient denies shortness of breath, fever, cough, and chest pain at PAT appointment.  Patient verbalized understanding of instructions provided today at the PAT appointment.  Patient asked to review instructions at home and day of surgery.

## 2018-06-17 NOTE — Pre-Procedure Instructions (Signed)
Valerie Mosley  06/17/2018       Your procedure is scheduled on October 10th.  Report to [facility] Admitting at 06:00 A.M.  Call this number if you have problems the morning of surgery:  [phone]   Remember:  Do not eat or drink after midnight.     Take these medicines the morning of surgery with A SIP OF WATER:  Tylenol (if needed)  Albuterol Inhaler (if needed) Bring inhaler with you  Amlodipine (Norvasc)  Fluticasone (Flonase) nasal spray (if needed)  Tiotropium (Spiriva)--if needed  7 days prior to surgery STOP taking any Aspirin (unless otherwise instructed by your surgeon), Aleve, Naproxen, Ibuprofen, Motrin, Advil, Goody's, BC's, all herbal medications, fish oil, and all vitamins.     Do not wear jewelry, make-up or nail polish.  Do not wear lotions, powders, or perfumes, or deodorant.  Do not shave 48 hours prior to surgery.   Do not bring valuables to the hospital.  [facility] is not responsible for any belongings or valuables.  Contacts, dentures or bridgework may not be worn into surgery.  Leave your suitcase in the car.  After surgery it may be brought to your room.  For patients admitted to the hospital, discharge time will be determined by your treatment team.  Patients discharged the day of surgery will not be allowed to drive home.    [place]- Preparing For Surgery  Before surgery, you can play an important role. Because skin is not sterile, your skin needs to be as free of germs as possible. You can reduce the number of germs on your skin by washing with CHG (chlorahexidine gluconate) Soap before surgery.  CHG is an antiseptic cleaner which kills germs and bonds with the skin to continue killing germs even after washing.    Oral Hygiene is also important to reduce your risk of infection.  Remember - BRUSH YOUR TEETH THE MORNING OF SURGERY WITH YOUR REGULAR TOOTHPASTE  Please do not use if you have an allergy to CHG or  antibacterial soaps. If your skin becomes reddened/irritated stop using the CHG.  Do not shave (including legs and underarms) for at least 48 hours prior to first CHG shower. It is OK to shave your face.  Please follow these instructions carefully.   1. Shower the NIGHT BEFORE SURGERY and the MORNING OF SURGERY with CHG.   2. If you chose to wash your hair, wash your hair first as usual with your normal shampoo.  3. After you shampoo, rinse your hair and body thoroughly to remove the shampoo.  4. Use CHG as you would any other liquid soap. You can apply CHG directly to the skin and wash gently with a scrungie or a clean washcloth.   5. Apply the CHG Soap to your body ONLY FROM THE NECK DOWN.  Do not use on open wounds or open sores. Avoid contact with your eyes, ears, mouth and genitals (private parts). Wash Face and genitals (private parts)  with your normal soap.  6. Wash thoroughly, paying special attention to the area where your surgery will be performed.  7. Thoroughly rinse your body with warm water from the neck down.  8. DO NOT shower/wash with your normal soap after using and rinsing off the CHG Soap.  9. Pat yourself dry with a CLEAN TOWEL.  10. Wear CLEAN PAJAMAS to bed the night before surgery, wear comfortable clothes the morning of surgery  11. Place CLEAN SHEETS  on your bed the night of your first shower and DO NOT SLEEP WITH PETS.    Day of Surgery:  Do not apply any deodorants/lotions.  Please wear clean clothes to the hospital/surgery center.   Remember to brush your teeth WITH YOUR REGULAR TOOTHPASTE.    Please read over the following fact sheets that you were given.

## 2018-06-25 ENCOUNTER — Encounter (HOSPITAL_COMMUNITY)
Admission: RE | Disposition: A | Discharge status: Home / Self Care | Payer: Self-pay | Source: Ambulatory Visit | Attending: Thoracic Surgery (Cardiothoracic Vascular Surgery)

## 2018-06-25 ENCOUNTER — Ambulatory Visit (HOSPITAL_COMMUNITY): Payer: Medicare Other

## 2018-06-25 ENCOUNTER — Ambulatory Visit (HOSPITAL_COMMUNITY): Payer: Medicare Other | Admitting: Certified Registered Nurse Anesthetist

## 2018-06-25 ENCOUNTER — Ambulatory Visit (HOSPITAL_COMMUNITY)
Admission: RE | Admit: 2018-06-25 | Discharge: 2018-06-25 | Disposition: A | Discharge status: Home / Self Care | Payer: Medicare Other | Source: Ambulatory Visit | Attending: Thoracic Surgery (Cardiothoracic Vascular Surgery) | Admitting: Thoracic Surgery (Cardiothoracic Vascular Surgery)

## 2018-06-25 DIAGNOSIS — Z902 Acquired absence of lung [part of]: Secondary | ICD-10-CM | POA: Diagnosis not present

## 2018-06-25 DIAGNOSIS — M419 Scoliosis, unspecified: Secondary | ICD-10-CM | POA: Diagnosis not present

## 2018-06-25 DIAGNOSIS — Z79899 Other long term (current) drug therapy: Secondary | ICD-10-CM | POA: Insufficient documentation

## 2018-06-25 DIAGNOSIS — I10 Essential (primary) hypertension: Secondary | ICD-10-CM | POA: Diagnosis not present

## 2018-06-25 DIAGNOSIS — J449 Chronic obstructive pulmonary disease, unspecified: Secondary | ICD-10-CM | POA: Insufficient documentation

## 2018-06-25 DIAGNOSIS — Z87891 Personal history of nicotine dependence: Secondary | ICD-10-CM | POA: Diagnosis not present

## 2018-06-25 DIAGNOSIS — J95812 Postprocedural air leak: Secondary | ICD-10-CM

## 2018-06-25 DIAGNOSIS — Z4689 Encounter for fitting and adjustment of other specified devices: Secondary | ICD-10-CM | POA: Diagnosis not present

## 2018-06-25 DIAGNOSIS — Z85118 Personal history of other malignant neoplasm of bronchus and lung: Secondary | ICD-10-CM | POA: Diagnosis not present

## 2018-06-25 DIAGNOSIS — Z09 Encounter for follow-up examination after completed treatment for conditions other than malignant neoplasm: Secondary | ICD-10-CM

## 2018-06-25 DIAGNOSIS — J942 Hemothorax: Secondary | ICD-10-CM | POA: Diagnosis not present

## 2018-06-25 DIAGNOSIS — J9382 Other air leak: Secondary | ICD-10-CM | POA: Diagnosis not present

## 2018-06-25 DIAGNOSIS — J984 Other disorders of lung: Secondary | ICD-10-CM | POA: Diagnosis not present

## 2018-06-25 DIAGNOSIS — J939 Pneumothorax, unspecified: Secondary | ICD-10-CM | POA: Diagnosis not present

## 2018-06-25 HISTORY — PX: INSERTION OF IBV VALVE: SHX5091

## 2018-06-25 HISTORY — PX: VIDEO BRONCHOSCOPY: SHX5072

## 2018-06-25 SURGERY — BRONCHOSCOPY, VIDEO-ASSISTED
Anesthesia: General

## 2018-06-25 MED ORDER — PROPOFOL 10 MG/ML IV BOLUS
INTRAVENOUS | Status: AC
  Filled 2018-06-25: qty 20

## 2018-06-25 MED ORDER — ONDANSETRON HCL 4 MG/2ML IJ SOLN
INTRAMUSCULAR | Status: AC
  Filled 2018-06-25: qty 2

## 2018-06-25 MED ORDER — MIDAZOLAM HCL 2 MG/2ML IJ SOLN
INTRAMUSCULAR | Status: AC
  Filled 2018-06-25: qty 2

## 2018-06-25 MED ORDER — EPINEPHRINE PF 1 MG/ML IJ SOLN
INTRAMUSCULAR | Status: AC
  Filled 2018-06-25: qty 1

## 2018-06-25 MED ORDER — SUGAMMADEX SODIUM 200 MG/2ML IV SOLN
INTRAVENOUS | Status: DC | PRN
  Administered 2018-06-25: 180 mg via INTRAVENOUS

## 2018-06-25 MED ORDER — MIDAZOLAM HCL 2 MG/2ML IJ SOLN
INTRAMUSCULAR | Status: DC | PRN
  Administered 2018-06-25: 1 mg via INTRAVENOUS

## 2018-06-25 MED ORDER — LIDOCAINE 2% (20 MG/ML) 5 ML SYRINGE
INTRAMUSCULAR | Status: DC | PRN
  Administered 2018-06-25: 40 mg via INTRAVENOUS

## 2018-06-25 MED ORDER — ONDANSETRON HCL 4 MG/2ML IJ SOLN
INTRAMUSCULAR | Status: DC | PRN
  Administered 2018-06-25: 4 mg via INTRAVENOUS

## 2018-06-25 MED ORDER — EPINEPHRINE PF 1 MG/ML IJ SOLN
INTRAMUSCULAR | Status: DC | PRN
  Administered 2018-06-25: 1 mg

## 2018-06-25 MED ORDER — PHENYLEPHRINE 40 MCG/ML (10ML) SYRINGE FOR IV PUSH (FOR BLOOD PRESSURE SUPPORT)
PREFILLED_SYRINGE | INTRAVENOUS | Status: DC | PRN
  Administered 2018-06-25: 160 ug via INTRAVENOUS
  Administered 2018-06-25: 80 ug via INTRAVENOUS
  Administered 2018-06-25: 160 ug via INTRAVENOUS

## 2018-06-25 MED ORDER — 0.9 % SODIUM CHLORIDE (POUR BTL) OPTIME
TOPICAL | Status: DC | PRN
  Administered 2018-06-25: 1000 mL

## 2018-06-25 MED ORDER — ROCURONIUM BROMIDE 50 MG/5ML IV SOSY
PREFILLED_SYRINGE | INTRAVENOUS | Status: AC
  Filled 2018-06-25: qty 5

## 2018-06-25 MED ORDER — PROPOFOL 10 MG/ML IV BOLUS
INTRAVENOUS | Status: DC | PRN
  Administered 2018-06-25: 90 mg via INTRAVENOUS

## 2018-06-25 MED ORDER — DEXAMETHASONE SODIUM PHOSPHATE 10 MG/ML IJ SOLN
INTRAMUSCULAR | Status: DC | PRN
  Administered 2018-06-25: 10 mg via INTRAVENOUS

## 2018-06-25 MED ORDER — PHENYLEPHRINE 40 MCG/ML (10ML) SYRINGE FOR IV PUSH (FOR BLOOD PRESSURE SUPPORT)
PREFILLED_SYRINGE | INTRAVENOUS | Status: AC
  Filled 2018-06-25: qty 10

## 2018-06-25 MED ORDER — ROCURONIUM BROMIDE 10 MG/ML (PF) SYRINGE
PREFILLED_SYRINGE | INTRAVENOUS | Status: DC | PRN
  Administered 2018-06-25: 40 mg via INTRAVENOUS

## 2018-06-25 MED ORDER — DEXAMETHASONE SODIUM PHOSPHATE 10 MG/ML IJ SOLN
INTRAMUSCULAR | Status: AC
  Filled 2018-06-25: qty 1

## 2018-06-25 MED ORDER — LIDOCAINE 2% (20 MG/ML) 5 ML SYRINGE
INTRAMUSCULAR | Status: AC
  Filled 2018-06-25: qty 5

## 2018-06-25 MED ORDER — FENTANYL CITRATE (PF) 250 MCG/5ML IJ SOLN
INTRAMUSCULAR | Status: DC | PRN
  Administered 2018-06-25: 100 ug via INTRAVENOUS

## 2018-06-25 MED ORDER — LACTATED RINGERS IV SOLN
INTRAVENOUS | Status: DC
  Administered 2018-06-25: 07:00:00 via INTRAVENOUS

## 2018-06-25 MED ORDER — FENTANYL CITRATE (PF) 250 MCG/5ML IJ SOLN
INTRAMUSCULAR | Status: AC
  Filled 2018-06-25: qty 5

## 2018-06-25 SURGICAL SUPPLY — 40 items
ADAPTER VALVE BIOPSY EBUS (MISCELLANEOUS) IMPLANT
ADPTR VALVE BIOPSY EBUS (MISCELLANEOUS)
BRUSH CYTOL CELLEBRITY 1.5X140 (MISCELLANEOUS) IMPLANT
CANISTER SUCT 3000ML PPV (MISCELLANEOUS) ×2 IMPLANT
CATH EMB 4FR 80CM (CATHETERS) IMPLANT
CATH EMB 5FR 80CM (CATHETERS) IMPLANT
CATH EMB 6FR 80CM (CATHETERS) IMPLANT
CONT SPEC 4OZ CLIKSEAL STRL BL (MISCELLANEOUS) ×4 IMPLANT
COVER BACK TABLE 60X90IN (DRAPES) ×3 IMPLANT
COVER WAND RF STERILE (DRAPES) ×2 IMPLANT
FILTER STRAW FLUID ASPIR (MISCELLANEOUS) IMPLANT
FORCEPS BIOP RJ4 1.8 (CUTTING FORCEPS) IMPLANT
FORCEPS RADIAL JAW LRG 4 PULM (INSTRUMENTS) IMPLANT
GAUZE SPONGE 4X4 12PLY STRL (GAUZE/BANDAGES/DRESSINGS) ×3 IMPLANT
GLOVE SURG SIGNA 7.5 PF LTX (GLOVE) ×2 IMPLANT
GOWN STRL REUS W/ TWL LRG LVL3 (GOWN DISPOSABLE) ×1 IMPLANT
GOWN STRL REUS W/ TWL XL LVL3 (GOWN DISPOSABLE) ×1 IMPLANT
GOWN STRL REUS W/TWL LRG LVL3 (GOWN DISPOSABLE) ×2
GOWN STRL REUS W/TWL XL LVL3 (GOWN DISPOSABLE) ×2
KIT CLEAN ENDO COMPLIANCE (KITS) ×2 IMPLANT
KIT TURNOVER KIT B (KITS) ×2 IMPLANT
MARKER SKIN DUAL TIP RULER LAB (MISCELLANEOUS) ×3 IMPLANT
NS IRRIG 1000ML POUR BTL (IV SOLUTION) ×2 IMPLANT
OIL SILICONE PENTAX (PARTS (SERVICE/REPAIRS)) ×3 IMPLANT
PAD ARMBOARD 7.5X6 YLW CONV (MISCELLANEOUS) ×4 IMPLANT
RADIAL JAW LRG 4 PULMONARY (INSTRUMENTS) ×1
STOPCOCK MORSE 400PSI 3WAY (MISCELLANEOUS) ×2 IMPLANT
SYR 10ML LL (SYRINGE) ×2 IMPLANT
SYR 20ML ECCENTRIC (SYRINGE) ×6 IMPLANT
SYR 5ML LL (SYRINGE) ×2 IMPLANT
SYR 5ML LUER SLIP (SYRINGE) ×2 IMPLANT
TOWEL GREEN STERILE (TOWEL DISPOSABLE) ×2 IMPLANT
TOWEL GREEN STERILE FF (TOWEL DISPOSABLE) ×2 IMPLANT
TOWEL NATURAL 4PK STERILE (DISPOSABLE) ×2 IMPLANT
TRAP SPECIMEN MUCOUS 40CC (MISCELLANEOUS) ×3 IMPLANT
TUBE CONNECTING 20X1/4 (TUBING) ×3 IMPLANT
UNDERPAD 30X30 (UNDERPADS AND DIAPERS) ×2 IMPLANT
VALVE BIOPSY  SINGLE USE (MISCELLANEOUS) ×1
VALVE BIOPSY SINGLE USE (MISCELLANEOUS) ×1 IMPLANT
VALVE SUCTION BRONCHIO DISP (MISCELLANEOUS) ×2 IMPLANT

## 2018-06-25 NOTE — Op Note (Signed)
NAME: Valerie Mosley, Valerie Mosley MEDICAL RECORD [id number] ACCOUNT [id number] DATE OF BIRTH:24-Apr-1951 FACILITY: [facility] LOCATION: [facility] PHYSICIAN:Carilyn Woolston C. Jahmiyah Dullea, MD  OPERATIVE REPORT  DATE OF PROCEDURE:  06/25/2018  PREOPERATIVE DIAGNOSIS:  Prolonged air leak, status post placement of intrabronchial valves.  POSTOPERATIVE DIAGNOSIS:  Prolonged air leak, status post placement of intrabronchial valves.  PROCEDURE:  Flexible fiberoptic bronchoscopy with removal of intrabronchial valves.  SURGEON:  Modesto Charon, MD  ASSISTANT:  None.  ANESTHESIA:  General.  FINDINGS:  Valves removed without difficulty.  CLINICAL NOTE:  The patient is a 67 year old woman who recently underwent a wedge resection for a second primary lung cancer.  Postoperatively, she had an air leak which was persistent.  She was treated with endobronchial valves, and the air leak  ultimately resolved.  She now is 8 weeks out from placement of valves and presents to have the valves removed.  The indications, risks, benefits, and alternatives were discussed in detail with the patient.  She understood and accepted the risks and  agreed to proceed.  OPERATIVE NOTE:  The patient was brought to the operating room on 06/25/2018.  She had induction of general anesthesia and was intubated.  After performing a timeout, flexible fiberoptic bronchoscopy was performed via the endotracheal tube.  There were  thick clear secretions bilaterally.  On the right side, there was normal endobronchial anatomy with no endobronchial lesions to the level of the subsegmental bronchi.  On the left side, there was a well-healed bronchial stump.  There was distortion of  the anatomy due to her previous lobectomy.  The valve in the basilar segmental trunk was removed without difficulty.  The scope then was replaced, and the more distal valve was removed without difficulty as well.  There was minimal bleeding, which  resolved in less than a  minute after removal of the valves.  Secretions were cleared with irrigation with saline.  The bronchoscope was removed.  The patient was extubated in the operating room and taken to the [facility] in good condition.  LN/NUANCE  D:06/25/2018 T:06/25/2018 JOB:[id number]

## 2018-06-25 NOTE — Brief Op Note (Signed)
06/25/2018  9:09 AM  PATIENT:  Valerie Mosley  67 y.o. female  PRE-OPERATIVE DIAGNOSIS:  AIR LEAK, IBV  POST-OPERATIVE DIAGNOSIS:  AIR LEAK, IBV  PROCEDURE:  VIDEO BRONCHOSCOPY and REMOVAL OF INTERBRONCHIAL VALVES x 2  SURGEON:  Surgeon(s) and Role:    * [name], MD - Primary  PHYSICIAN ASSISTANT:   ASSISTANTS: none   ANESTHESIA:   general  EBL: minimal  BLOOD ADMINISTERED:none  DRAINS: none   LOCAL MEDICATIONS USED:  NONE  SPECIMEN:  No Specimen  DISPOSITION OF SPECIMEN:  N/A  COUNTS:  NO endo  TOURNIQUET:  * No tourniquets in log *  DICTATION: .Other Dictation: Dictation Number -  PLAN OF CARE: Discharge to home after PACU  PATIENT DISPOSITION:  PACU - hemodynamically stable.   Delay start of Pharmacological VTE agent (>24hrs) due to surgical blood loss or risk of bleeding: not applicable

## 2018-06-25 NOTE — Interval H&P Note (Signed)
History and Physical Interval Note:  06/25/2018 8:11 AM  Valerie Mosley  has presented today for surgery, with the diagnosis of AIR LEAK, IBV  The various methods of treatment have been discussed with the patient and family. After consideration of risks, benefits and other options for treatment, the patient has consented to  Procedure(s): VIDEO BRONCHOSCOPY (N/A) REMOVAL OF INTERBRONCHIAL VALVE (IBV) (N/A) as a surgical intervention .  The patient's history has been reviewed, patient examined, no change in status, stable for surgery.  I have reviewed the patient's chart and labs.  Questions were answered to the patient's satisfaction.     [name]

## 2018-06-25 NOTE — Transfer of Care (Signed)
Immediate Anesthesia Transfer of Care Note  Patient: Valerie Mosley  Procedure(s) Performed: VIDEO BRONCHOSCOPY (N/A ) REMOVAL OF THREE INTERBRONCHIAL VALVE (IBV) (N/A )  Patient Location: PACU  Anesthesia Type:General  Level of Consciousness: awake, alert  and oriented  Airway & Oxygen Therapy: Patient Spontanous Breathing and Patient connected to nasal cannula oxygen  Post-op Assessment: Report given to RN and Post -op Vital signs reviewed and stable  Post vital signs: Reviewed and stable  Last Vitals:  Vitals Value Taken Time  BP 106/76 06/25/2018  9:16 AM  Temp    Pulse 95 06/25/2018  9:17 AM  Resp 19 06/25/2018  9:17 AM  SpO2 96 % 06/25/2018  9:17 AM  Vitals shown include unvalidated device data.  Last Pain:  Vitals:   06/25/18 0614  TempSrc: Oral         Complications: No apparent anesthesia complications

## 2018-06-25 NOTE — Anesthesia Preprocedure Evaluation (Addendum)
Anesthesia Evaluation  Patient identified by MRN, date of birth, ID band Patient awake    Reviewed: Allergy & Precautions, NPO status , Patient's Chart, lab work & pertinent test results  History of Anesthesia Complications (+) PONV and history of anesthetic complications  Airway Mallampati: II  TM Distance: >3 FB Neck ROM: Full    Dental  (+) Teeth Intact   Pulmonary COPD,  COPD inhaler, Recent URI , former smoker,  Air leak   breath sounds clear to auscultation       Cardiovascular hypertension, Pt. on medications  Rhythm:Regular     Neuro/Psych negative neurological ROS  negative psych ROS   GI/Hepatic negative GI ROS, Neg liver ROS,   Endo/Other  negative endocrine ROS  Renal/GU negative Renal ROS     Musculoskeletal   Abdominal   Peds  Hematology negative hematology ROS (+)   Anesthesia Other Findings 2012 tte: Left ventricle: The cavity size was normal. There was mild concentric hypertrophy. Systolic function was normal. The estimated ejection fraction was in the range of 60% to 65%. Although no diagnostic regional wall motion abnormality was identified, this possibility cannot be completely excluded on the basis of this study. Doppler parameters are consistent with abnormal left ventricular relaxation (grade 1 diastolic dysfunction). - Right atrium: The atrium was mildly dilated.  Reproductive/Obstetrics                           Anesthesia Physical Anesthesia Plan  ASA: III  Anesthesia Plan: General   Post-op Pain Management:    Induction: Intravenous  PONV Risk Score and Plan: 4 or greater and Ondansetron and Dexamethasone  Airway Management Planned: Oral ETT  Additional Equipment: None  Intra-op Plan:   Post-operative Plan: Extubation in OR  Informed Consent: I have reviewed the patients History and Physical, chart, labs and discussed the  procedure including the risks, benefits and alternatives for the proposed anesthesia with the patient or authorized representative who has indicated his/her understanding and acceptance.   Dental advisory given  Plan Discussed with: CRNA and Surgeon  Anesthesia Plan Comments:         Anesthesia Quick Evaluation

## 2018-06-25 NOTE — Anesthesia Procedure Notes (Signed)
Procedure Name: Intubation Date/Time: 06/25/2018 8:36 AM Performed by: Bryson Corona, CRNA Pre-anesthesia Checklist: Patient identified, Emergency Drugs available, Suction available and Patient being monitored Patient Re-evaluated:Patient Re-evaluated prior to induction Oxygen Delivery Method: Circle System Utilized Preoxygenation: Pre-oxygenation with 100% oxygen Induction Type: IV induction Ventilation: Mask ventilation without difficulty Laryngoscope Size: Mac and 3 Grade View: Grade II Tube type: Oral Tube size: 8.5 mm Number of attempts: 1 Airway Equipment and Method: Stylet and Oral airway Placement Confirmation: ETT inserted through vocal cords under direct vision,  positive ETCO2 and breath sounds checked- equal and bilateral Secured at: 21 cm Tube secured with: Tape Dental Injury: Teeth and Oropharynx as per pre-operative assessment

## 2018-06-25 NOTE — Discharge Instructions (Addendum)
Do not drive or engage in heavy physical activity for 24 hours  You may resume normal activities tomorrow  You may cough up small amounts of blood over the next few days  You may use acetaminophen (Tylenol) if needed for discomfort  Call [phone] if you develop chest pain, shortness of breath, fever > 101 F or cough up more than 2 tablespoons of blood  My office will contact you with a follow up appointment    General Anesthesia, Adult, Care After These instructions provide you with information about caring for yourself after your procedure. Your health care provider may also give you more specific instructions. Your treatment has been planned according to current medical practices, but problems sometimes occur. Call your health care provider if you have any problems or questions after your procedure. What can I expect after the procedure? After the procedure, it is common to have:  Vomiting.  A sore throat.  Mental slowness.  It is common to feel:  Nauseous.  Cold or shivery.  Sleepy.  Tired.  Sore or achy, even in parts of your body where you did not have surgery.  Follow these instructions at home: For at least 24 hours after the procedure:  Do not: ? Participate in activities where you could fall or become injured. ? Drive. ? Use heavy machinery. ? Drink alcohol. ? Take sleeping pills or medicines that cause drowsiness. ? Make important decisions or sign legal documents. ? Take care of children on your own.  Rest. Eating and drinking  If you vomit, drink water, juice, or soup when you can drink without vomiting.  Drink enough fluid to keep your urine clear or pale yellow.  Make sure you have little or no nausea before eating solid foods.  Follow the diet recommended by your health care provider. General instructions  Have a responsible adult stay with you until you are awake and alert.  Return to your normal activities as told by your health care  provider. Ask your health care provider what activities are safe for you.  Take over-the-counter and prescription medicines only as told by your health care provider.  If you smoke, do not smoke without supervision.  Keep all follow-up visits as told by your health care provider. This is important. Contact a health care provider if:  You continue to have nausea or vomiting at home, and medicines are not helpful.  You cannot drink fluids or start eating again.  You cannot urinate after 8-12 hours.  You develop a skin rash.  You have fever.  You have increasing redness at the site of your procedure. Get help right away if:  You have difficulty breathing.  You have chest pain.  You have unexpected bleeding.  You feel that you are having a life-threatening or urgent problem. This information is not intended to replace advice given to you by your health care provider. Make sure you discuss any questions you have with your health care provider. Document Released: 12/09/2000 Document Revised: 02/05/2016 Document Reviewed: 08/17/2015 Elsevier Interactive Patient Education  [employer].

## 2018-06-25 NOTE — H&P (Signed)
HPI: [name] returns for scheduled follow-up visit  Mahathi Pokorney is a 67 year old woman with a history of tobacco abuse and COPD.  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  She developed a new nodule in the superior segment of her left lower lobe.  Needle biopsy showed adenocarcinoma.  She preferred surgery of radiation therapy I did a wedge resection on 04/08/2018.  That was complicated by persistent air leak.  Intrabronchial valves were placed on 04/16/2018 but she continued to have a leak.  She went home with a chest tube on 04/24/2018.  I saw her in the office on 05/05/2018 at that time and there was no evidence of an air leak.  Her tube was removed on the 21st.  She has not had any problems with her breathing since then.  She does still have some soreness with her incisions and is not able to wear a bra.  She is not taking any narcotics.  She is using Tylenol occasionally for the pain.  She has been driving without problems.      Past Medical History:  Diagnosis Date  . Cancer ([place])    Stage IA non-small cell lung cancer, left upper lobectomy 12/2011  . COPD (chronic obstructive pulmonary disease) ([place])   . Cough 07/29/11   started coughing up blood this am  . Cough   . Hypertension   . Kyphoscoliosis   . PONV (postoperative nausea and vomiting)   . Recurrent upper respiratory infection (URI)           Current Outpatient Medications  Medication Sig Dispense Refill  . acetaminophen (TYLENOL) 500 MG tablet Take 1,000 mg by mouth every 6 (six) hours as needed for moderate pain or headache.    . albuterol (VENTOLIN HFA) 108 (90 Base) MCG/ACT inhaler Inhale 2 puffs into the lungs every 4 (four) hours as needed for wheezing or shortness of breath. For shortness of breath 8.51 g 5  . alendronate (FOSAMAX) 70 MG tablet Take 70 mg by mouth every Friday.     Marland Kitchen amLODipine (NORVASC) 10 MG tablet Take 10 mg by mouth daily.      .  colestipol (COLESTID) 1 g tablet Take 1 g by mouth daily.    . fluticasone (FLONASE) 50 MCG/ACT nasal spray instill 2 sprays into each nostril once daily (Patient taking differently: Place 2 sprays into both nostrils daily as needed for allergies. ) 16 g 5  . guaiFENesin (MUCINEX) 600 MG 12 hr tablet Take 1 tablet (600 mg total) by mouth 2 (two) times daily.    . montelukast (SINGULAIR) 10 MG tablet Take 1 tablet (10 mg total) by mouth at bedtime. 30 tablet 5  . sertraline (ZOLOFT) 50 MG tablet Take 50 mg by mouth at bedtime.    Marland Kitchen tiotropium (SPIRIVA HANDIHALER) 18 MCG inhalation capsule inhale the contents of one capsule in the handihaler once daily (Patient taking differently: Place 18 mcg into inhaler and inhale daily. ) 30 capsule 5  . traMADol (ULTRAM) 50 MG tablet Take 1 tablet (50 mg total) by mouth every 6 (six) hours as needed for moderate pain. 20 tablet 0  . Vitamin D, Ergocalciferol, (DRISDOL) 50000 units CAPS capsule Take 50,000 Units by mouth See admin instructions. Take 50000 units by mouth twice weekly on Tuesday and Friday     No current facility-administered medications for this visit.     Physical Exam BP 100/68   Pulse 96   Resp 18  Ht 5\' 4"  (1.626 m)   Wt 116 lb 9.6 oz (52.9 kg)   SpO2 96% Comment: RA  BMI 20.89 kg/m  67 year old woman in no acute distress Alert and oriented x3 with no focal deficits Lungs with faint wheezing bilaterally Incisions healing well  Diagnostic Tests: CHEST - 2 VIEW  COMPARISON: Chest radiograph 05/06/2018  Chest CT 02/10/2018, 03/06/2018  FINDINGS: Status post left upper lobectomy with unchanged air and fluid collection at the left apical pleura. Left hemidiaphragm elevation and atelectasis are unchanged. The lungs are hyperinflated with diffuse interstitial coarsening. No focal consolidation. No sizable pleural effusion.  IMPRESSION: Status post left upper lobectomy with unchanged left apical pleural fluid  and air.   Electronically Signed By: Ulyses Jarred M.D. On: 05/19/2018 13:36 I personally reviewed the chest x-ray images and concur with the findings noted above  Impression: Mrs. Barhorst is a 67 year old woman with a history of tobacco abuse who recently had a redo left VATS for wedge resection of a second primary adenocarcinoma.  This was a T1, N0(x) lesion.  This was complicated by a prolonged air leak.  IBV were placed but had relatively minimal effect.  Her air leak eventually resolved and she was able to have the chest tube removed about 2 weeks ago.  Her chest x-ray today shows no issues post chest tube removal.  There is a small airspace apically.  Surgical standpoint she is healing well.  She does still have some soreness which is to be expected.  She is not requiring any narcotics.  There are no restrictions on her activities at this time, but she was advised to increase her activities gradually.  Since this is a second lesion I think would be best to have her evaluated in our multidisciplinary thoracic oncology clinic to see if oncology or radiation oncology would recommend any adjuvant treatment.  I do not think will be necessary.  She does need the intrabronchial valves removed.  I prefer to leave those for 8 weeks before removing them.  That would be early October.  She has a wedding the first week end of October.  So we will tentatively plan to remove them on Thursday, 06/25/2018  Plan: Referral to [facility] to see oncology. Bronchoscopy for intrabronchial valve removal on 06/25/2018  [name], MD [facility] [phone]   No interval change  Revonda Standard. Roxan Hockey, MD [facility] [phone]

## 2018-06-26 ENCOUNTER — Encounter (HOSPITAL_COMMUNITY): Payer: Self-pay | Admitting: Thoracic Surgery (Cardiothoracic Vascular Surgery)

## 2018-06-26 NOTE — Anesthesia Postprocedure Evaluation (Signed)
Anesthesia Post Note  Patient: Valerie Mosley  Procedure(s) Performed: VIDEO BRONCHOSCOPY (N/A ) REMOVAL OF THREE INTERBRONCHIAL VALVE (IBV) (N/A )     Patient location during evaluation: PACU Anesthesia Type: General Level of consciousness: awake and patient cooperative Pain management: pain level controlled Vital Signs Assessment: post-procedure vital signs reviewed and stable Respiratory status: spontaneous breathing, nonlabored ventilation, respiratory function stable and patient connected to nasal cannula oxygen Cardiovascular status: blood pressure returned to baseline and stable Postop Assessment: no apparent nausea or vomiting Anesthetic complications: no    Last Vitals:  Vitals:   06/25/18 1043 06/25/18 1058  BP:    Pulse:    Resp:    Temp:    SpO2: 96% 94%    Last Pain:  Vitals:   06/25/18 1005  TempSrc:   PainSc: 0-No pain                 Maeson Purohit

## 2018-07-03 ENCOUNTER — Other Ambulatory Visit: Payer: Self-pay | Admitting: Thoracic Surgery (Cardiothoracic Vascular Surgery)

## 2018-07-03 DIAGNOSIS — C3490 Malignant neoplasm of unspecified part of unspecified bronchus or lung: Secondary | ICD-10-CM

## 2018-07-06 ENCOUNTER — Encounter: Payer: Self-pay | Admitting: Thoracic Surgery (Cardiothoracic Vascular Surgery)

## 2018-07-07 ENCOUNTER — Ambulatory Visit (INDEPENDENT_AMBULATORY_CARE_PROVIDER_SITE_OTHER): Payer: Self-pay | Admitting: Thoracic Surgery (Cardiothoracic Vascular Surgery)

## 2018-07-07 ENCOUNTER — Other Ambulatory Visit: Payer: Self-pay

## 2018-07-07 ENCOUNTER — Ambulatory Visit
Admission: RE | Admit: 2018-07-07 | Discharge: 2018-07-07 | Disposition: A | Discharge status: Home / Self Care | Payer: 59 | Source: Ambulatory Visit | Attending: Thoracic Surgery (Cardiothoracic Vascular Surgery) | Admitting: Thoracic Surgery (Cardiothoracic Vascular Surgery)

## 2018-07-07 ENCOUNTER — Encounter: Payer: Self-pay | Admitting: Thoracic Surgery (Cardiothoracic Vascular Surgery)

## 2018-07-07 VITALS — BP 111/76 | HR 96 | Resp 16 | Ht 64.0 in | Wt 118.6 lb

## 2018-07-07 DIAGNOSIS — C3490 Malignant neoplasm of unspecified part of unspecified bronchus or lung: Secondary | ICD-10-CM

## 2018-07-07 DIAGNOSIS — J95812 Postprocedural air leak: Secondary | ICD-10-CM

## 2018-07-07 DIAGNOSIS — Z902 Acquired absence of lung [part of]: Secondary | ICD-10-CM

## 2018-07-07 NOTE — Progress Notes (Signed)
[address]       [place], [address]             [phone]       HPI: Valerie Mosley returns for follow-up after having intrabronchial valves removed  Valerie Mosley is a 67 year old woman with history of tobacco abuse and COPD.  She had a stage IA adenocarcinoma treated with a left upper lobectomy in 2013.  She developed a new nodule in her left lower lobe.  That also was a stage IA adenocarcinoma.  I did a wedge resection on 04/08/2018.  She had a prolonged air leak afterwards and had to have intrabronchial valves placed.  I remove the valves on 06/25/2018.  The procedure was uncomplicated and she went home the same day.  Since removal she is noticed an increase in clear mucus production.  She has not had any breathing issues.  Past Medical History:  Diagnosis Date  . Cancer ([place])    Stage IA non-small cell lung cancer, left upper lobectomy 12/2011  . COPD (chronic obstructive pulmonary disease) ([place])   . Cough 07/29/11   started coughing up blood this am  . Cough   . Hypertension   . Kyphoscoliosis   . PONV (postoperative nausea and vomiting)   . Recurrent upper respiratory infection (URI)      Current Outpatient Medications  Medication Sig Dispense Refill  . acetaminophen (TYLENOL) 500 MG tablet Take 1,000 mg by mouth every 6 (six) hours as needed for moderate pain or headache.    . albuterol (VENTOLIN HFA) 108 (90 Base) MCG/ACT inhaler Inhale 2 puffs into the lungs every 4 (four) hours as needed for wheezing or shortness of breath. For shortness of breath 8.51 g 5  . alendronate (FOSAMAX) 70 MG tablet Take 70 mg by mouth every Friday.     Marland Kitchen amLODipine (NORVASC) 10 MG tablet Take 10 mg by mouth daily.      . colestipol (COLESTID) 1 g tablet Take 1 g by mouth daily as needed (for diarrhea).     . fluticasone (FLONASE) 50 MCG/ACT nasal spray instill 2 sprays into each nostril once daily (Patient taking differently: Place 2 sprays into both nostrils  daily as needed for allergies. ) 16 g 5  . montelukast (SINGULAIR) 10 MG tablet Take 1 tablet (10 mg total) by mouth at bedtime. 30 tablet 5  . sertraline (ZOLOFT) 50 MG tablet Take 50 mg by mouth at bedtime.    Marland Kitchen tiotropium (SPIRIVA HANDIHALER) 18 MCG inhalation capsule inhale the contents of one capsule in the handihaler once daily (Patient taking differently: Place 18 mcg into inhaler and inhale daily. ) 30 capsule 5   No current facility-administered medications for this visit.     Physical Exam BP 111/76 (BP Location: Right Arm, Patient Position: Sitting, Cuff Size: Normal)   Pulse 96   Resp 16   Ht 5\' 4"  (1.626 m)   Wt 118 lb 9.6 oz (53.8 kg)   SpO2 96% Comment: ON RA  BMI 20.66 kg/m  67 year old woman in no acute distress Alert and oriented x2 no focal deficits Lungs faint wheezes bilaterally, diminished at left base  Diagnostic Tests: CHEST - 2 VIEW  COMPARISON:  06/25/2018  FINDINGS: Normal heart size, mediastinal contours, and pulmonary vascularity.  Chronic elevation of LEFT diaphragm.  Emphysematous and bronchitic changes consistent with COPD.  Chronic accentuation of interstitial markings stable.  Postsurgical changes LEFT upper lobe stable.  No acute infiltrate or  pleural effusion.  No IBV valves identified.  No definite pneumothorax.  IMPRESSION: COPD changes with chronic accentuation of interstitial markings.  No acute abnormalities.   Electronically Signed   By: Lavonia Dana M.D.   On: 07/07/2018 12:31 I personally reviewed the chest x-ray images and concur with the findings noted above.  Impression: Valerie Mosley is a 67 year old woman who had a wedge resection for a stage IA adenocarcinoma complicated by prolonged air leak.  Intrabronchial valves were placed in her air leak resolved.  She now has undergone removal of the intrabronchial valves without any complications.  Plan: She will see Dr. Julien Nordmann in March with a CT scan.  I  will see her back after she has that scan done.  [name], MD [facility] [phone]

## 2018-07-13 ENCOUNTER — Ambulatory Visit (INDEPENDENT_AMBULATORY_CARE_PROVIDER_SITE_OTHER): Payer: Medicare Other | Admitting: Emergency Medicine

## 2018-07-13 ENCOUNTER — Encounter: Payer: Self-pay | Admitting: Emergency Medicine

## 2018-07-13 DIAGNOSIS — J449 Chronic obstructive pulmonary disease, unspecified: Secondary | ICD-10-CM

## 2018-07-13 DIAGNOSIS — C3490 Malignant neoplasm of unspecified part of unspecified bronchus or lung: Secondary | ICD-10-CM

## 2018-07-13 DIAGNOSIS — J301 Allergic rhinitis due to pollen: Secondary | ICD-10-CM | POA: Diagnosis not present

## 2018-07-13 DIAGNOSIS — Z23 Encounter for immunization: Secondary | ICD-10-CM

## 2018-07-13 MED ORDER — UMECLIDINIUM-VILANTEROL 62.5-25 MCG/INH IN AEPB: 1.0000 | INHALATION_SPRAY | Freq: Every day | RESPIRATORY_TRACT | 0 refills | Status: DC

## 2018-07-13 NOTE — Assessment & Plan Note (Signed)
Continue Singulair daily and fluticasone nasal spray if needed.

## 2018-07-13 NOTE — Patient Instructions (Signed)
Please temporarily stop your Spiriva. We will do a trial of Anoro 1 inhalation once daily.  Please call us in about 1 month to let us know if you benefit from this medication. Try to obtain a copy of your inhaler formulary from your insurance company.  We would like this information to help [place] determine which maintenance inhaler will be best long-term. If you like the Anoro and if your insurance covers it then we will continue it going forward. Keep your albuterol available to use 2 puffs up to every 4 hours if needed for shortness of breath Flu shot today. Your pneumonia shots are up-to-date. Get your CT scan of the chest in March 2020 as planned by Drs. Hendrickson in [place]. Continue Singulair daily and fluticasone nasal spray if needed. Follow with Dr Lamonte Sakai in 2 months or sooner if you have any problems.

## 2018-07-13 NOTE — Progress Notes (Signed)
Subjective:    Patient ID: Valerie Mosley, female    DOB: Feb 10, 1951, 67 y.o.   MRN: [id number] HPI 67 yo woman, history of tobacco use and COPD, status post left upper lobe lobectomy in April 2013 for adenocarcinoma of the lung.  She no longer smokes.  She has been managed on Spiriva (her insurance would not cover Stiolto).  Serial CT scans have not shown any evidence for recurrence, last on 01/21/17.  Acute exacerbation in August for which she was treated with prednisone and antibiotics.  She improved and is back to baseline. She is working 5 days a week, remains active, is doing chores at home. She has some cough at night that wakes her up, not every night. Non-productive. She uses albuterol about . Remains of fluticasone NS. She started singulair at her OV last time with Dr [place] Bells and she feels that she is benefiting more than from loratadine. She is due for CT chest next Spring.   ROV 01/13/18 --follow-up visit today for 67 year old woman with a history of COPD and adenocarcinoma of the lung status post left upper lobe lobectomy in April 2013. She has had some trouble with increased cough during the pollen season, mostly at night. She took her loratadine daily during this time, usually takes prn. Also uses flonase prn. She is also on singulair every day. She remains on spiriva. Uses albuterol about 2-3x a week. She is still working. No flares since last time. Her next CT chest is 02/10/18 w follow up w Dr Roxan Hockey.   ROV 07/13/18 --[name] follows up today for her history of COPD, left upper lobe lobectomy for adenocarcinoma (12/2011).  She has lung nodules that we have been following with serial scans along with Dr. Roxan Hockey.  On her most recent imaging there was a superior segmental left lower lobe nodule that was enlarging, PET scan 02/18/2018 showed hypermetabolism and then needle biopsy 6/21 confirmed adenocarcinoma.  This was surgically resected 04/08/2018.  She had a prolonged air leak and  had to have endobronchial valves placed, ultimately removed on 06/25/2018.  Repeat CT scan of her chest is planned for 11/2018  She deals with chronic cough in the setting of allergic rhinitis.  She is currently managed on spiriva, has been using her albuterol slightly more often for SOB. She is not sure that she is inhaling her meds as well as she was before the surgery. She has not had flu shot yet. PNA shot is up to date.    She is on singulair, flonase is prn.   PULMONARY FUNCTON TEST 09/19/2011  Peak Flow 130  FVC 2.87  FEV1 1.94  FEV1/FVC 67.6  FVC  % Predicted 88  FEV % Predicted 81  FeF 25-75 1.04  FeF 25-75 % Predicted 2.65     Objective:   Vitals:   07/13/18 0925  BP: 108/64  Pulse: 99  SpO2: 95%  Weight: 119 lb 6.4 oz (54.2 kg)  Height: 5' 3.75" (1.619 m)   Gen: Pleasant, well-nourished, in no distress,  normal affect  ENT: No lesions,  mouth clear,  oropharynx clear, no postnasal drip, mild congestion  Neck: No JVD, no stridor  Lungs: No use of accessory muscles, coarse bilaterally, no wheeze  Cardiovascular: RRR, heart sounds normal, no murmur or gallops, no peripheral edema  Musculoskeletal: No deformities, no cyanosis or clubbing  Neuro: alert, non focal  Skin: Warm, no lesions or rashes     COPD (chronic obstructive pulmonary disease) Some increased albuterol  use.  She is unsure as to whether she is getting her full dose of Spiriva due to her inspiratory force or whether it is just not working as effectively.  No evidence of acute exacerbation  Please temporarily stop your Spiriva. We will do a trial of Anoro 1 inhalation once daily.  Please call Valerie Mosley in about 1 month to let Valerie Mosley know if you benefit from this medication. Try to obtain a copy of your inhaler formulary from your insurance company.  We would like this information to help Valerie Mosley determine which maintenance inhaler will be best long-term. If you like the Anoro and if your insurance covers it then  we will continue it going forward. Keep your albuterol available to use 2 puffs up to every 4 hours if needed for shortness of breath Flu shot today. Your pneumonia shots are up-to-date. Follow with Dr Lamonte Sakai in 2 months or sooner if you have any problems.  Non-small cell lung cancer Second episode stage Ia adenocarcinoma superior segment left long status post resection.  Get your CT scan of the chest in March 2020 as planned by Drs. Hendrickson in [place].  Allergic rhinitis Continue Singulair daily and fluticasone nasal spray if needed.  Valerie Apo, MD, PhD 07/13/2018, 9:44 AM [facility] [phone] or if no answer [phone]

## 2018-07-13 NOTE — Assessment & Plan Note (Signed)
Second episode stage Ia adenocarcinoma superior segment left long status post resection.  Get your CT scan of the chest in March 2020 as planned by Drs. Hendrickson in [place].

## 2018-07-13 NOTE — Assessment & Plan Note (Signed)
Some increased albuterol use.  She is unsure as to whether she is getting her full dose of Spiriva due to her inspiratory force or whether it is just not working as effectively.  No evidence of acute exacerbation  Please temporarily stop your Spiriva. We will do a trial of Anoro 1 inhalation once daily.  Please call us in about 1 month to let us know if you benefit from this medication. Try to obtain a copy of your inhaler formulary from your insurance company.  We would like this information to help [place] determine which maintenance inhaler will be best long-term. If you like the Anoro and if your insurance covers it then we will continue it going forward. Keep your albuterol available to use 2 puffs up to every 4 hours if needed for shortness of breath Flu shot today. Your pneumonia shots are up-to-date. Follow with Dr Lamonte Sakai in 2 months or sooner if you have any problems.

## 2018-07-27 ENCOUNTER — Telehealth: Payer: Self-pay | Admitting: Emergency Medicine

## 2018-07-27 MED ORDER — UMECLIDINIUM-VILANTEROL 62.5-25 MCG/INH IN AEPB: 1.0000 | INHALATION_SPRAY | Freq: Every day | RESPIRATORY_TRACT | 0 refills | Status: DC

## 2018-07-27 NOTE — Telephone Encounter (Signed)
Called and spoke to patient, patient states she was given a trial of anoro at her last visit and was told to get her drug formulary. She is going to bring hte formulary by the office and wanted to know if we had another sample of anoro. Patient states she only needs enough to get her through the week so RB can place her on a inhaler that is on her formulary. Informed patient sample has been placed upfront and once formulary was received we would get this information to [place]. Voiced understanding. Nothing further is needed at this time.

## 2018-07-27 NOTE — Telephone Encounter (Signed)
Patient dropped off her medication formulary. Formulary has been placed in RB's cubby. Will give to [place] tomorrow since she is gone for the day.

## 2018-07-29 NOTE — Telephone Encounter (Signed)
Formulary has been placed in RB's look at folder.

## 2018-07-29 NOTE — Telephone Encounter (Signed)
Formulary will be given to RB this morning.

## 2018-08-04 NOTE — Telephone Encounter (Signed)
Attempted to contact pt. I did not receive an answer. I have left a message for pt to return our call.  

## 2018-08-04 NOTE — Telephone Encounter (Signed)
Anoro is Tier 3, bevespi and Spiriva are tier 2.   I'd recommend trying bevespi 2 puffs BID is she is interested, may be better covered by her insurance.

## 2018-08-05 MED ORDER — GLYCOPYRROLATE-FORMOTEROL 9-4.8 MCG/ACT IN AERO: 2.0000 | INHALATION_SPRAY | Freq: Two times a day (BID) | RESPIRATORY_TRACT | 11 refills | Status: DC

## 2018-08-05 NOTE — Telephone Encounter (Signed)
Called and spoke with pt letting her know that [place] has said for her to try Bevespi instead of Anoro due to the [place] being a tier 2 and the Anoro being a tier 3. Tier 3 inhalers are going to be more expensive.  Pt stated she was fine with [place] sending an Rx in to her pharmacy of the [facility] [place], I stated to her if there were any problems with the Rx to call our office. Rx sent to pt's preferred pharmacy. Nothing further needed.

## 2018-09-14 ENCOUNTER — Ambulatory Visit (INDEPENDENT_AMBULATORY_CARE_PROVIDER_SITE_OTHER): Payer: Medicare Other | Admitting: Emergency Medicine

## 2018-09-14 ENCOUNTER — Encounter: Payer: Self-pay | Admitting: Emergency Medicine

## 2018-09-14 DIAGNOSIS — J301 Allergic rhinitis due to pollen: Secondary | ICD-10-CM

## 2018-09-14 DIAGNOSIS — C3490 Malignant neoplasm of unspecified part of unspecified bronchus or lung: Secondary | ICD-10-CM | POA: Diagnosis not present

## 2018-09-14 DIAGNOSIS — J449 Chronic obstructive pulmonary disease, unspecified: Secondary | ICD-10-CM | POA: Diagnosis not present

## 2018-09-14 MED ORDER — FLUTICASONE PROPIONATE 50 MCG/ACT NA SUSP: 2.0000 | Freq: Every day | NASAL | 2 refills | Status: DC | PRN

## 2018-09-14 NOTE — Patient Instructions (Signed)
Please continue Bevespi 2 puffs twice a day. Keep your albuterol available to use 2 puffs if needed for shortness of breath, chest tightness, wheezing. Continue your Singulair once daily Restart fluticasone nasal spray, 2 sprays each nostril once daily. Get your CT scan of the chest this spring as planned by Drs. Hendrickson in [place]. Follow with Dr Lamonte Sakai in 4 months or sooner if you have any problems.

## 2018-09-14 NOTE — Assessment & Plan Note (Signed)
She used the Anoro until samples run out, started the [place] last week.  Need to continue this to see if she is going to get the same clinical benefit.  Keep albuterol available.

## 2018-09-14 NOTE — Assessment & Plan Note (Signed)
Increased congestion and right ear fullness.  Ear exam unremarkable except for some evidence of increased pressure.  I like for her to add back her Flonase to Singulair see if she gets benefit.  We could consider ENT referral depending on the results.

## 2018-09-14 NOTE — Progress Notes (Signed)
Subjective:    Patient ID: Valerie Mosley, female    DOB: [birth year], 67 y.o.   MRN: [id number] HPI 67 yo woman, history of tobacco use and COPD, status post left upper lobe lobectomy in April 2013 for adenocarcinoma of the lung.  She no longer smokes.  She has been managed on Spiriva (her insurance would not cover Stiolto).  Serial CT scans have not shown any evidence for recurrence, last on 01/21/17.  Acute exacerbation in August for which she was treated with prednisone and antibiotics.  She improved and is back to baseline. She is working 5 days a week, remains active, is doing chores at home. She has some cough at night that wakes her up, not every night. Non-productive. She uses albuterol about . Remains of fluticasone NS. She started singulair at her OV last time with Dr Valerie Mosley and she feels that she is benefiting more than from loratadine. She is due for CT chest next Spring.   ROV 01/13/18 --follow-up visit today for 67 year old woman with a history of COPD and adenocarcinoma of the lung status post left upper lobe lobectomy in April 2013. She has had some trouble with increased cough during the pollen season, mostly at night. She took her loratadine daily during this time, usually takes prn. Also uses flonase prn. She is also on singulair every day. She remains on spiriva. Uses albuterol about 2-3x a week. She is still working. No flares since last time. Her next CT chest is 02/10/18 w follow up w Dr Valerie Mosley.   ROV 07/13/18 --Valerie Mosley follows up today for her history of COPD, left upper lobe lobectomy for adenocarcinoma (12/2011).  She has lung nodules that we have been following with serial scans along with Dr. Roxan Mosley.  On her most recent imaging there was a superior segmental left lower lobe nodule that was enlarging, PET scan 02/18/2018 showed hypermetabolism and then needle biopsy 6/21 confirmed adenocarcinoma.  This was surgically resected 04/08/2018.  She had a prolonged air leak and  had to have endobronchial valves placed, ultimately removed on 06/25/2018.  Repeat CT scan of her chest is planned for 11/2018  She deals with chronic cough in the setting of allergic rhinitis.  She is currently managed on spiriva, has been using her albuterol slightly more often for SOB. She is not sure that she is inhaling her meds as well as she was before the surgery. She has not had flu shot yet. PNA shot is up to date.   She is on singulair, flonase is prn.   ROV 09/14/18 --67 year old woman with history of COPD, adenocarcinoma of the lung status post left upper lobe lobectomy, lung nodules that we have been following with serial imaging ultimately revealing a recurrence in the left lower lobe superior segment 02/2018, c/b BPF.  She underwent surgical resection.  Since last time we made a change from Spiriva to [facility] and then ultimately to [place] based on her drug formulary.  She just got on the bevespi last week.  She is currently using albuterol about 1-2x week.  She reports that she will get SOB with heavier exertion, walking fast. She is able to shop. She still works, Network engineer job. No flares since last time. She has intermittent flares of cough. No wheeze CT chest this Spring.  Intermittent nasal congestion.  She has some right ear fullness and intermittent change in her hearing that she thinks is related to congestion.  Currently on Singulair alone.  PULMONARY FUNCTON TEST 09/19/2011  Peak Flow 130  FVC 2.87  FEV1 1.94  FEV1/FVC 67.6  FVC  % Predicted 88  FEV % Predicted 81  FeF 25-75 1.04  FeF 25-75 % Predicted 2.65     Objective:   Vitals:   09/14/18 0918  BP: 110/70  Pulse: 86  SpO2: 96%  Weight: 118 lb 9.6 oz (53.8 kg)  Height: 5' 3.75" (1.619 m)   Gen: Pleasant, well-nourished, in no distress,  normal affect  ENT: No lesions,  mouth clear,  oropharynx clear, no postnasal drip, mild congestion, some right TM fullness without purulence  Neck: No JVD, no stridor  Lungs: No  use of accessory muscles, coarse bilaterally, no wheeze  Cardiovascular: RRR, heart sounds normal, no murmur or gallops, no peripheral edema  Musculoskeletal: No deformities, no cyanosis or clubbing  Neuro: alert, non focal  Skin: Warm, no lesions or rashes     Non-small cell lung cancer (HCC) Currently on observation, planning for repeat CT scan of the chest in spring 2020.  Following with Dr. Julien Mosley and also Dr. Roxan Mosley.  COPD (chronic obstructive pulmonary disease) She used the Anoro until samples run out, started the [place] last week.  Need to continue this to see if she is going to get the same clinical benefit.  Keep albuterol available.  Allergic rhinitis Increased congestion and right ear fullness.  Ear exam unremarkable except for some evidence of increased pressure.  I like for her to add back her Flonase to Singulair see if she gets benefit.  We could consider ENT referral depending on the results.  Valerie Apo, MD, PhD 09/14/2018, 9:38 AM [facility] [phone] or if no answer [phone]

## 2018-09-14 NOTE — Addendum Note (Signed)
Addended by: Valerie Salts on: 09/14/2018 11:21 AM   Modules accepted: Orders

## 2018-09-14 NOTE — Assessment & Plan Note (Signed)
Currently on observation, planning for repeat CT scan of the chest in spring 2020.  Following with Dr. Julien Nordmann and also Dr. Roxan Hockey.

## 2018-10-27 DIAGNOSIS — F439 Reaction to severe stress, unspecified: Secondary | ICD-10-CM | POA: Diagnosis not present

## 2018-10-27 DIAGNOSIS — J449 Chronic obstructive pulmonary disease, unspecified: Secondary | ICD-10-CM | POA: Diagnosis not present

## 2018-10-27 DIAGNOSIS — M810 Age-related osteoporosis without current pathological fracture: Secondary | ICD-10-CM | POA: Diagnosis not present

## 2018-10-27 DIAGNOSIS — Z1239 Encounter for other screening for malignant neoplasm of breast: Secondary | ICD-10-CM | POA: Diagnosis not present

## 2018-10-27 DIAGNOSIS — I10 Essential (primary) hypertension: Secondary | ICD-10-CM | POA: Diagnosis not present

## 2018-10-27 DIAGNOSIS — C3490 Malignant neoplasm of unspecified part of unspecified bronchus or lung: Secondary | ICD-10-CM | POA: Diagnosis not present

## 2018-10-27 DIAGNOSIS — F325 Major depressive disorder, single episode, in full remission: Secondary | ICD-10-CM | POA: Diagnosis not present

## 2018-10-29 ENCOUNTER — Other Ambulatory Visit: Payer: Self-pay | Admitting: Family Medicine

## 2018-10-29 DIAGNOSIS — Z1231 Encounter for screening mammogram for malignant neoplasm of breast: Secondary | ICD-10-CM

## 2018-11-24 ENCOUNTER — Ambulatory Visit
Admission: RE | Admit: 2018-11-24 | Discharge: 2018-11-24 | Disposition: A | Discharge status: Home / Self Care | Payer: 59 | Source: Ambulatory Visit | Attending: Family Medicine | Admitting: Family Medicine

## 2018-11-24 DIAGNOSIS — Z1231 Encounter for screening mammogram for malignant neoplasm of breast: Secondary | ICD-10-CM

## 2018-11-30 ENCOUNTER — Other Ambulatory Visit: Payer: Self-pay

## 2018-11-30 ENCOUNTER — Inpatient Hospital Stay: Payer: Medicare Other | Attending: Internal Medicine

## 2018-11-30 ENCOUNTER — Inpatient Hospital Stay: Payer: Medicare Other

## 2018-11-30 DIAGNOSIS — C3490 Malignant neoplasm of unspecified part of unspecified bronchus or lung: Secondary | ICD-10-CM

## 2018-11-30 DIAGNOSIS — C3432 Malignant neoplasm of lower lobe, left bronchus or lung: Secondary | ICD-10-CM | POA: Diagnosis not present

## 2018-11-30 LAB — CBC WITH DIFFERENTIAL (CANCER CENTER ONLY)
Abs Immature Granulocytes: 0.02 10*3/uL (ref 0.00–0.07)
Basophils Absolute: 0.1 10*3/uL (ref 0.0–0.1)
Basophils Relative: 1 %
Eosinophils Absolute: 0.1 10*3/uL (ref 0.0–0.5)
Eosinophils Relative: 2 %
HCT: 45.9 % (ref 36.0–46.0)
Hemoglobin: 15 g/dL (ref 12.0–15.0)
Immature Granulocytes: 0 %
Lymphocytes Relative: 30 %
Lymphs Abs: 2.7 10*3/uL (ref 0.7–4.0)
MCH: 30.2 pg (ref 26.0–34.0)
MCHC: 32.7 g/dL (ref 30.0–36.0)
MCV: 92.5 fL (ref 80.0–100.0)
Monocytes Absolute: 0.6 10*3/uL (ref 0.1–1.0)
Monocytes Relative: 6 %
Neutro Abs: 5.6 10*3/uL (ref 1.7–7.7)
Neutrophils Relative %: 61 %
Platelet Count: 312 10*3/uL (ref 150–400)
RBC: 4.96 MIL/uL (ref 3.87–5.11)
RDW: 13.9 % (ref 11.5–15.5)
WBC Count: 9.1 10*3/uL (ref 4.0–10.5)
nRBC: 0 % (ref 0.0–0.2)

## 2018-11-30 LAB — CMP (CANCER CENTER ONLY)
ALT: 15 U/L (ref 0–44)
AST: 15 U/L (ref 15–41)
Albumin: 3.9 g/dL (ref 3.5–5.0)
Alkaline Phosphatase: 130 U/L — ABNORMAL HIGH (ref 38–126)
Anion gap: 10 (ref 5–15)
BUN: 19 mg/dL (ref 8–23)
CO2: 23 mmol/L (ref 22–32)
Calcium: 8.8 mg/dL — ABNORMAL LOW (ref 8.9–10.3)
Chloride: 106 mmol/L (ref 98–111)
Creatinine: 1.02 mg/dL — ABNORMAL HIGH (ref 0.44–1.00)
GFR, Est AFR Am: >60 mL/min (ref >60)
GFR, Estimated: 56 mL/min — ABNORMAL LOW (ref >60)
Glucose, Bld: 94 mg/dL (ref 70–99)
Potassium: 4.5 mmol/L (ref 3.5–5.1)
Sodium: 139 mmol/L (ref 135–145)
Total Bilirubin: 0.3 mg/dL (ref 0.3–1.2)
Total Protein: 7.2 g/dL (ref 6.5–8.1)

## 2018-12-01 ENCOUNTER — Ambulatory Visit
Admission: RE | Admit: 2018-12-01 | Discharge: 2018-12-01 | Disposition: A | Discharge status: Home / Self Care | Payer: Medicare Other | Source: Ambulatory Visit | Attending: Internal Medicine | Admitting: Internal Medicine

## 2018-12-01 ENCOUNTER — Other Ambulatory Visit: Payer: Medicare Other

## 2018-12-01 DIAGNOSIS — J439 Emphysema, unspecified: Secondary | ICD-10-CM | POA: Diagnosis not present

## 2018-12-01 DIAGNOSIS — C3490 Malignant neoplasm of unspecified part of unspecified bronchus or lung: Secondary | ICD-10-CM

## 2018-12-01 MED ORDER — IOPAMIDOL (ISOVUE-300) INJECTION 61%
75.0000 mL | Freq: Once | INTRAVENOUS | Status: AC | PRN
  Administered 2018-12-01: 75 mL via INTRAVENOUS

## 2018-12-07 ENCOUNTER — Other Ambulatory Visit: Payer: Self-pay

## 2018-12-07 ENCOUNTER — Telehealth: Payer: Self-pay | Admitting: Medical Oncology

## 2018-12-07 ENCOUNTER — Inpatient Hospital Stay: Payer: Medicare Other | Admitting: Internal Medicine

## 2018-12-07 NOTE — Telephone Encounter (Signed)
Told pt that Valerie Mosley will call her in the next few days with e-visit.

## 2018-12-08 ENCOUNTER — Encounter: Payer: Self-pay | Admitting: Thoracic Surgery (Cardiothoracic Vascular Surgery)

## 2018-12-08 ENCOUNTER — Ambulatory Visit (INDEPENDENT_AMBULATORY_CARE_PROVIDER_SITE_OTHER): Payer: Medicare Other | Admitting: Thoracic Surgery (Cardiothoracic Vascular Surgery)

## 2018-12-08 VITALS — BP 120/72 | HR 97 | Temp 97.5°F | Resp 18 | Ht 63.0 in | Wt 117.0 lb

## 2018-12-08 DIAGNOSIS — C3490 Malignant neoplasm of unspecified part of unspecified bronchus or lung: Secondary | ICD-10-CM

## 2018-12-08 NOTE — Progress Notes (Signed)
[address]       [place] [address]             [phone]      HPI: Valerie Mosley returns for a scheduled follow-up  Valerie Mosley is a 68 year old woman with a history of tobacco abuse and COPD.  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  In July 2019 I did a VATS and wedge resection for an adenocarcinoma in the superior segment of the lower lobe.  She had a persistent air leak postoperatively and required IBV placement.  Those were removed in October.  She is anxious about her CT scan results.  She quit smoking back in 2013.  Her appetite is good and her weight is stable.  She says that she frequently has coughing spells.  She has not seen Dr. Lamonte Sakai since December.  She is scheduled to see him again next month.  Overall she feels well.  Past Medical History:  Diagnosis Date  . Cancer ([facility])    Stage IA non-small cell lung cancer, left upper lobectomy 12/2011  . COPD (chronic obstructive pulmonary disease) ([place])   . Cough 07/29/11   started coughing up blood this am  . Cough   . Hypertension   . Kyphoscoliosis   . PONV (postoperative nausea and vomiting)   . Recurrent upper respiratory infection (URI)     Current Outpatient Medications  Medication Sig Dispense Refill  . acetaminophen (TYLENOL) 500 MG tablet Take 1,000 mg by mouth every 6 (six) hours as needed for moderate pain or headache.    . albuterol (VENTOLIN HFA) 108 (90 Base) MCG/ACT inhaler Inhale 2 puffs into the lungs every 4 (four) hours as needed for wheezing or shortness of breath. For shortness of breath 8.51 g 5  . alendronate (FOSAMAX) 70 MG tablet Take 70 mg by mouth every Friday.     Marland Kitchen amLODipine (NORVASC) 10 MG tablet Take 10 mg by mouth daily.      . colestipol (COLESTID) 1 g tablet Take 1 g by mouth daily as needed (for diarrhea).     . fluticasone (FLONASE) 50 MCG/ACT nasal spray Place 2 sprays into both nostrils daily as needed for allergies. 16 g 2  .  Glycopyrrolate-Formoterol (BEVESPI AEROSPHERE) 9-4.8 MCG/ACT AERO Inhale 2 puffs into the lungs 2 (two) times daily. 1 Inhaler 11  . montelukast (SINGULAIR) 10 MG tablet Take 1 tablet (10 mg total) by mouth at bedtime. 30 tablet 5  . sertraline (ZOLOFT) 50 MG tablet Take 50 mg by mouth at bedtime.     No current facility-administered medications for this visit.     Physical Exam BP 120/72 (BP Location: Left Arm, Patient Position: Sitting, Cuff Size: Small)   Pulse 97   Temp (!) 97.5 F (36.4 C)   Resp 18   Ht 5\' 3"  (1.6 m)   Wt 117 lb (53.1 kg)   SpO2 94% Comment: RA  BMI 20.38 kg/m  68 year old woman in no acute distress Alert and oriented x3 with no focal deficit No cervical supraclavicular adenopathy Lungs diminished breath sounds bilaterally left greater than right Cardiac regular rate and rhythm normal S1-S2  Diagnostic Tests: CT CHEST WITH CONTRAST  TECHNIQUE: Multidetector CT imaging of the chest was performed during intravenous contrast administration.  CONTRAST:  2mL ISOVUE-300 IOPAMIDOL (ISOVUE-300) INJECTION 61%  COMPARISON:  Chest CT 02/10/2018 and PET-CT 02/18/2018  FINDINGS: Cardiovascular: The heart is normal in size. No  pericardial effusion. Stable tortuosity and calcification of the thoracic aorta. Stable coronary artery calcifications.  Mediastinum/Nodes: Stable 14 mm precarinal lymph node on image number 50.  Stable 10.5 mm right hilar lymph node on image number 56.  Fluid noted in the pericardial recesses. Prominent left atrial appendage. The esophagus is grossly normal.  Lungs/Pleura: Status post resection of the left apical lung lesion. No residual or recurrent tumor is demonstrated. There are also surgical changes from a left lower lobe wedge resection with stable nodular scarring type changes at the left lung base and loss of volume with eventration of the left hemidiaphragm. Stable 6.5 mm left lower lobe nodule on image number 85.   Three right upper lobe pulmonary nodules are stable. 7 mm and 6 mm adjacent nodules on image number 55 are unchanged. There is also a 5 mm medial nodule on image number 54. No new pulmonary lesions.  Stable advanced underlying emphysematous changes and areas of pulmonary scarring. No acute infiltrates or effusions.  Upper Abdomen: Stable hepatic cysts.  Stable left renal calculus.  Musculoskeletal: No breast masses, supraclavicular or axillary adenopathy. Stable right thyroid goiter. The bony structures are intact. Stable exaggerated thoracic kyphosis.  IMPRESSION: 1. Surgical changes involving the left lung apex and left lung base but no findings suspicious for residual or recurrent tumor. 2. Three stable right upper lobe pulmonary nodules. Recommend continued surveillance. 3. Stable mediastinal and hilar lymph nodes. 4. Stable advanced emphysematous changes and pulmonary scarring.  Aortic Atherosclerosis (ICD10-I70.0) and Emphysema (ICD10-J43.9).   Electronically Signed   By: Marijo Sanes M.D.   On: 12/01/2018 11:25 I personally reviewed the CT images and concur with the findings noted above  Impression: Valerie Mosley is a 68 year old woman with a history of tobacco abuse and COPD.  She had a left upper lobectomy for stage Ia non-small cell carcinoma in 2013 and a wedge resection for a stage Ib (T2, N0) adenocarcinoma in July 2019.  She currently is doing well with no evidence of recurrent disease.  Tobacco abuse-quit in 2013.  COPD-more frequent coughing recently.  No fevers or sputum production.  Has an appointment with Dr. Lamonte Sakai next month.  Plan: Follow-up as scheduled with Dr. Julien Nordmann Return in 1 year  [name], MD [facility] [phone]

## 2018-12-10 ENCOUNTER — Inpatient Hospital Stay (HOSPITAL_BASED_OUTPATIENT_CLINIC_OR_DEPARTMENT_OTHER): Payer: Medicare Other | Admitting: Internal Medicine

## 2018-12-10 DIAGNOSIS — C3490 Malignant neoplasm of unspecified part of unspecified bronchus or lung: Secondary | ICD-10-CM

## 2018-12-10 NOTE — Progress Notes (Signed)
Virtual Visit via Telephone Note  I connected with [name] on 12/10/18 at 11:15 AM EDT by telephone and verified that I am speaking with the correct person using two identifiers.   I discussed the limitations, risks, security and privacy concerns of performing an evaluation and management service by telephone and the availability of in person appointments. I also discussed with the patient that there may be a patient responsible charge related to this service. The patient expressed understanding and agreed to proceed.  VISIT DIAGNOSIS: History of lung cancer and discussion of the staging scan results.  DIAGNOSIS:  1) stage IA, T1 a, N0, M0) non-small cell lung cancer, adenocarcinoma diagnosed in 2013. 2) stage Ib (T2, and 0, M0) non-small cell lung cancer, adenocarcinoma measuring 1.3 cm with visceral pleural invasion and focal lymphovascular invasion diagnosed and June 2019.  PRIOR THERAPY: 1) status post right upper lobectomy in April 2013 2) She status post left lower lobe superior segmentectomy under the care of Dr. Roxan Hockey on April 08, 2018.  CURRENT THERAPY: Observation.  History of Present Illness: Valerie Mosley 68 y.o. female had a telephone virtual visit today for evaluation and discussion of her scan results.  The patient is feeling fine with no concerning complaints.  She denied having any recent chest pain, shortness of breath, cough or hemoptysis.  She denied having any fever or chills.  She has no nausea, vomiting, diarrhea or constipation.  She denied having any headache or visual changes.  She had repeat CT scan of the chest performed recently.  MEDICAL HISTORY: Past Medical History:  Diagnosis Date  . Cancer ([place])    Stage IA non-small cell lung cancer, left upper lobectomy 12/2011  . COPD (chronic obstructive pulmonary disease) ([place])   . Cough 07/29/11   started coughing up blood this am  . Cough   . Hypertension   . Kyphoscoliosis   . PONV  (postoperative nausea and vomiting)   . Recurrent upper respiratory infection (URI)     ALLERGIES:  has No Known Allergies.  MEDICATIONS:  Current Outpatient Medications  Medication Sig Dispense Refill  . acetaminophen (TYLENOL) 500 MG tablet Take 1,000 mg by mouth every 6 (six) hours as needed for moderate pain or headache.    . albuterol (VENTOLIN HFA) 108 (90 Base) MCG/ACT inhaler Inhale 2 puffs into the lungs every 4 (four) hours as needed for wheezing or shortness of breath. For shortness of breath 8.51 g 5  . alendronate (FOSAMAX) 70 MG tablet Take 70 mg by mouth every Friday.     Marland Kitchen amLODipine (NORVASC) 10 MG tablet Take 10 mg by mouth daily.      . colestipol (COLESTID) 1 g tablet Take 1 g by mouth daily as needed (for diarrhea).     . fluticasone (FLONASE) 50 MCG/ACT nasal spray Place 2 sprays into both nostrils daily as needed for allergies. 16 g 2  . Glycopyrrolate-Formoterol (BEVESPI AEROSPHERE) 9-4.8 MCG/ACT AERO Inhale 2 puffs into the lungs 2 (two) times daily. 1 Inhaler 11  . montelukast (SINGULAIR) 10 MG tablet Take 1 tablet (10 mg total) by mouth at bedtime. 30 tablet 5  . sertraline (ZOLOFT) 50 MG tablet Take 50 mg by mouth at bedtime.     No current facility-administered medications for this visit.     SURGICAL HISTORY:  Past Surgical History:  Procedure Laterality Date  . BRONCHOSCOPY    . CATARACT EXTRACTION, BILATERAL  2016  . EYE SURGERY    . INSERTION OF  IBV VALVE Left 04/16/2018   Procedure: INSERTION OF INTERBRONCHIAL VALVE (IBV);  Surgeon: [name], MD;  Location: [facility];  Service: Thoracic;  Laterality: Left;  . INSERTION OF IBV VALVE N/A 06/25/2018   Procedure: REMOVAL OF THREE INTERBRONCHIAL VALVE (IBV);  Surgeon: [name], MD;  Location: [facility];  Service: Thoracic;  Laterality: N/A;  . LOBECTOMY  12/17/2011   Procedure: LOBECTOMY;  Surgeon: [name], MD;  Location: [place];  Service: Thoracic;  Laterality: Left;  (L)VATS,  WEDGE RESECTION, LEFT UPPER LOBECTOMY,  Multiple node biopsies  . REMOVAL OF PLEURAL DRAINAGE CATHETER Left 05/06/2018   Procedure: REMOVAL OF CHEST TUBE;  Surgeon: [name], MD;  Location: [facility];  Service: Thoracic;  Laterality: Left;  . THYROID SURGERY  1980   1/2 thyroid removed - benign  . URETHRA SURGERY    . VIDEO ASSISTED THORACOSCOPY Left 04/08/2018   Procedure: REDO LEFT VIDEO ASSISTED THORACOSCOPY with left lower lobe wedge resection;  Surgeon: [name], MD;  Location: [name];  Service: Thoracic;  Laterality: Left;  Marland Kitchen VIDEO BRONCHOSCOPY N/A 04/16/2018   Procedure: VIDEO BRONCHOSCOPY;  Surgeon: [name], MD;  Location: [place] [facility];  Service: Thoracic;  Laterality: N/A;  . VIDEO BRONCHOSCOPY N/A 06/25/2018   Procedure: VIDEO BRONCHOSCOPY;  Surgeon: [name], MD;  [facility];  Service: Thoracic;  Laterality: N/A;    REVIEW OF SYSTEMS:  A comprehensive review of systems was negative.   LABORATORY DATA: Lab Results  Component Value Date   WBC 9.1 11/30/2018   HGB 15.0 11/30/2018   HCT 45.9 11/30/2018   MCV 92.5 11/30/2018   PLT 312 11/30/2018      Chemistry      Component Value Date/Time   NA 139 11/30/2018 1528   K 4.5 11/30/2018 1528   CL 106 11/30/2018 1528   CO2 23 11/30/2018 1528   BUN 19 11/30/2018 1528   CREATININE 1.02 (H) 11/30/2018 1528      Component Value Date/Time   CALCIUM 8.8 (L) 11/30/2018 1528   ALKPHOS 130 (H) 11/30/2018 1528   AST 15 11/30/2018 1528   ALT 15 11/30/2018 1528   BILITOT 0.3 11/30/2018 1528       RADIOGRAPHIC STUDIES: Ct Chest W Contrast  Result Date: 12/01/2018 CLINICAL DATA:  History of lung cancer. Status post resection of a left apical lung lesion 06/25/2018. Prior history of left lower lobe wedge resection. EXAM: CT CHEST WITH CONTRAST TECHNIQUE: Multidetector CT imaging of the chest was performed during intravenous contrast administration. CONTRAST:  83mL ISOVUE-300 IOPAMIDOL  (ISOVUE-300) INJECTION 61% COMPARISON:  Chest CT 02/10/2018 and PET-CT 02/18/2018 FINDINGS: Cardiovascular: The heart is normal in size. No pericardial effusion. Stable tortuosity and calcification of the thoracic aorta. Stable coronary artery calcifications. Mediastinum/Nodes: Stable 14 mm precarinal lymph node on image number 50. Stable 10.5 mm right hilar lymph node on image number 56. Fluid noted in the pericardial recesses. Prominent left atrial appendage. The esophagus is grossly normal. Lungs/Pleura: Status post resection of the left apical lung lesion. No residual or recurrent tumor is demonstrated. There are also surgical changes from a left lower lobe wedge resection with stable nodular scarring type changes at the left lung base and loss of volume with eventration of the left hemidiaphragm. Stable 6.5 mm left lower lobe nodule on image number 85. Three right upper lobe pulmonary nodules are stable. 7 mm and 6 mm adjacent nodules on image number 55 are unchanged. There is  also a 5 mm medial nodule on image number 54. No new pulmonary lesions. Stable advanced underlying emphysematous changes and areas of pulmonary scarring. No acute infiltrates or effusions. Upper Abdomen: Stable hepatic cysts.  Stable left renal calculus. Musculoskeletal: No breast masses, supraclavicular or axillary adenopathy. Stable right thyroid goiter. The bony structures are intact. Stable exaggerated thoracic kyphosis. IMPRESSION: 1. Surgical changes involving the left lung apex and left lung base but no findings suspicious for residual or recurrent tumor. 2. Three stable right upper lobe pulmonary nodules. Recommend continued surveillance. 3. Stable mediastinal and hilar lymph nodes. 4. Stable advanced emphysematous changes and pulmonary scarring. Aortic Atherosclerosis (ICD10-I70.0) and Emphysema (ICD10-J43.9). Electronically Signed   By: Marijo Sanes M.D.   On: 12/01/2018 11:25   Mm 3d Screen Breast Bilateral  Result Date:  11/25/2018 CLINICAL DATA:  Screening. EXAM: DIGITAL SCREENING BILATERAL MAMMOGRAM WITH TOMO AND CAD COMPARISON:  Previous exam(s). ACR Breast Density Category b: There are scattered areas of fibroglandular density. FINDINGS: There are no findings suspicious for malignancy. Images were processed with CAD. IMPRESSION: No mammographic evidence of malignancy. A result letter of this screening mammogram will be mailed directly to the patient. RECOMMENDATION: Screening mammogram in one year. (Code:[id number]-B-01Y) BI-RADS CATEGORY  1: Negative. Electronically Signed   By: Kristopher Oppenheim M.D.   On: 11/25/2018 11:46    ASSESSMENT AND PLAN:  This is a very pleasant 68 years old white female with history of a stage Ia non-small cell lung cancer, adenocarcinoma status post right upper lobectomy in April 2013 in addition to stage Ib non-small cell lung cancer, adenocarcinoma with visceral pleural invasion diagnosed in June 2019 status post left lower lobe superior segmentectomy under the care of Dr. Roxan Hockey. The patient is currently on observation and she is feeling fine. She had repeat CT scan of the chest performed recently.  I personally and independently reviewed the scans and discussed the results with the patient today. Her scan showed no concerning findings for disease progression. I recommended for the patient to continue on observation with repeat CT scan of the chest in 6 months for restaging of her disease. She was advised to call immediately if she has any concerning symptoms in the interval. The patient voices understanding of current disease status and treatment options and is in agreement with the current care plan.  All questions were answered. The patient knows to call the clinic with any problems, questions or concerns. We can certainly see the patient much sooner if necessary.  Follow Up Instructions: Follow-up in 6 months with repeat CT scan of the chest.   I discussed the assessment and  treatment plan with the patient. The patient was provided an opportunity to ask questions and all were answered. The patient agreed with the plan and demonstrated an understanding of the instructions.   The patient was advised to call back or seek an in-person evaluation if the symptoms worsen or if the condition fails to improve as anticipated.  I provided 15 minutes of non-face-to-face time during this encounter.   Eilleen Kempf, MD

## 2018-12-14 ENCOUNTER — Telehealth: Payer: Self-pay | Admitting: Oncology

## 2018-12-14 ENCOUNTER — Telehealth: Payer: Self-pay | Admitting: Internal Medicine

## 2018-12-14 NOTE — Telephone Encounter (Signed)
Scheduled appt per sch message f/u in 6 months - sent reminder letter in the mail with appt date and time

## 2018-12-14 NOTE — Telephone Encounter (Signed)
Sent reminder letter in the mail - scheduled appt per 3/27 sch message.

## 2019-01-14 ENCOUNTER — Ambulatory Visit: Payer: Medicare Other | Admitting: Emergency Medicine

## 2019-02-03 ENCOUNTER — Encounter: Payer: Self-pay | Admitting: Emergency Medicine

## 2019-02-03 ENCOUNTER — Ambulatory Visit (INDEPENDENT_AMBULATORY_CARE_PROVIDER_SITE_OTHER): Payer: Medicare Other | Admitting: Emergency Medicine

## 2019-02-03 ENCOUNTER — Other Ambulatory Visit: Payer: Self-pay

## 2019-02-03 DIAGNOSIS — C3490 Malignant neoplasm of unspecified part of unspecified bronchus or lung: Secondary | ICD-10-CM | POA: Diagnosis not present

## 2019-02-03 DIAGNOSIS — J301 Allergic rhinitis due to pollen: Secondary | ICD-10-CM | POA: Diagnosis not present

## 2019-02-03 DIAGNOSIS — J449 Chronic obstructive pulmonary disease, unspecified: Secondary | ICD-10-CM

## 2019-02-03 NOTE — Assessment & Plan Note (Signed)
Continue Singulair 10 mg each evening. Continue fluticasone (Flonase) nasal spray, 2 sprays each nostril once daily.  Start taking this in the morning instead of the evening.

## 2019-02-03 NOTE — Patient Instructions (Addendum)
Please continue Bevespi 2 puffs twice daily. Keep your albuterol available to use 2 puffs if needed for shortness of breath, chest tightness, wheezing. Continue Singulair 10 mg each evening. Continue fluticasone (Flonase) nasal spray, 2 sprays each nostril once daily.  Start taking this in the morning instead of the evening. Get your flu shot in the fall. Get your CT scan of the chest in September as planned.  Follow with Drs. Roxan Hockey and [place] as planned. Follow with Dr Lamonte Sakai in 6 months or sooner if you have any problems

## 2019-02-03 NOTE — Assessment & Plan Note (Addendum)
Please continue Bevespi 2 puffs twice daily. Keep your albuterol available to use 2 puffs if needed for shortness of breath, chest tightness, wheezing. Get your flu shot in the fall. Follow with Dr Lamonte Sakai in 6 months or sooner if you have any problems

## 2019-02-03 NOTE — Assessment & Plan Note (Signed)
Get your CT scan of the chest in September as planned.  Follow with Drs. Roxan Hockey and [place] as planned.

## 2019-02-03 NOTE — Progress Notes (Signed)
  Subjective:    Patient ID: Valerie Mosley, female    DOB: November 25, 1950, 68 y.o.   MRN: [id number] HPI   ROV 09/14/18 --68 year old woman with history of COPD, adenocarcinoma of the lung status post left upper lobe lobectomy, lung nodules that we have been following with serial imaging ultimately revealing a recurrence in the left lower lobe superior segment 02/2018, c/b BPF.  She underwent surgical resection.  Since last time we made a change from [facility] to [facility] and then ultimately to [place] based on her drug formulary.  She just got on the bevespi last week.  She is currently using albuterol about 1-2x week.  She reports that she will get SOB with heavier exertion, walking fast. She is able to shop. She still works, Network engineer job. No flares since last time. She has intermittent flares of cough. No wheeze CT chest this Spring.  Intermittent nasal congestion.  She has some right ear fullness and intermittent change in her hearing that she thinks is related to congestion.  Currently on Singulair alone.  ROV 02/03/19 --this is a follow-up visit for [name], 68 year old woman with COPD and adenocarcinoma of the lung.  She is undergone left upper lobe lobectomy, left lower lobe superior segmentectomy. Last Ct was 12/01/18 > no recurrence. Stable chronic nodules.   Also has allergic rhinitis on Singulair and flonase.  She is on Bevespi, is tolerating, feels that she benefits. She does still have some exertional SOB - happened to her recently when she was gardening. She keeps a cough, usually dry. Rare albuterol use. No exacerbations.     PULMONARY FUNCTON TEST 09/19/2011  Peak Flow 130  FVC 2.87  FEV1 1.94  FEV1/FVC 67.6  FVC  % Predicted 88  FEV % Predicted 81  FeF 25-75 1.04  FeF 25-75 % Predicted 2.65     Objective:   Vitals:   02/03/19 1605  BP: 116/68  Pulse: 92  SpO2: 96%  Weight: 127 lb (57.6 kg)  Height: 5\' 4"  (1.626 m)   Gen: Pleasant, well-nourished, in no distress,  normal  affect  ENT: No lesions,  mouth clear,  oropharynx clear, no postnasal drip  Neck: No JVD, no stridor  Lungs: No use of accessory muscles, coarse bilaterally, no wheeze  Cardiovascular: RRR, heart sounds normal, no murmur, intermittent S4 heard, no peripheral edema  Musculoskeletal: No deformities, no cyanosis or clubbing  Neuro: alert, non focal  Skin: Warm, no lesions or rashes     COPD (chronic obstructive pulmonary disease) Please continue Bevespi 2 puffs twice daily. Keep your albuterol available to use 2 puffs if needed for shortness of breath, chest tightness, wheezing. Get your flu shot in the fall. Follow with Dr Lamonte Sakai in 6 months or sooner if you have any problems  Non-small cell lung cancer [facility]) Get your CT scan of the chest in September as planned.  Follow with Drs. Roxan Hockey and [place] as planned.   Allergic rhinitis Continue Singulair 10 mg each evening. Continue fluticasone (Flonase) nasal spray, 2 sprays each nostril once daily.  Start taking this in the morning instead of the evening.  Baltazar Apo, MD, PhD 02/03/2019, 4:37 PM [facility] [phone] or if no answer [phone]

## 2019-03-18 ENCOUNTER — Ambulatory Visit: Payer: Medicare Other | Admitting: Emergency Medicine

## 2019-04-12 ENCOUNTER — Other Ambulatory Visit: Payer: Self-pay

## 2019-04-12 DIAGNOSIS — Z20822 Contact with and (suspected) exposure to covid-19: Secondary | ICD-10-CM

## 2019-04-14 LAB — NOVEL CORONAVIRUS, NAA: SARS-CoV-2, NAA: NOT DETECTED

## 2019-04-20 DIAGNOSIS — L821 Other seborrheic keratosis: Secondary | ICD-10-CM | POA: Diagnosis not present

## 2019-04-20 DIAGNOSIS — D1801 Hemangioma of skin and subcutaneous tissue: Secondary | ICD-10-CM | POA: Diagnosis not present

## 2019-04-20 DIAGNOSIS — D485 Neoplasm of uncertain behavior of skin: Secondary | ICD-10-CM | POA: Diagnosis not present

## 2019-04-20 DIAGNOSIS — D225 Melanocytic nevi of trunk: Secondary | ICD-10-CM | POA: Diagnosis not present

## 2019-04-20 DIAGNOSIS — C44311 Basal cell carcinoma of skin of nose: Secondary | ICD-10-CM | POA: Diagnosis not present

## 2019-06-14 ENCOUNTER — Ambulatory Visit (HOSPITAL_COMMUNITY)
Admission: RE | Admit: 2019-06-14 | Discharge: 2019-06-14 | Disposition: A | Discharge status: Home / Self Care | Payer: Medicare Other | Source: Ambulatory Visit | Attending: Internal Medicine | Admitting: Internal Medicine

## 2019-06-14 ENCOUNTER — Other Ambulatory Visit: Payer: Medicare Other

## 2019-06-14 ENCOUNTER — Other Ambulatory Visit: Payer: Self-pay

## 2019-06-14 ENCOUNTER — Inpatient Hospital Stay: Payer: Medicare Other | Attending: Internal Medicine

## 2019-06-14 DIAGNOSIS — E041 Nontoxic single thyroid nodule: Secondary | ICD-10-CM | POA: Diagnosis not present

## 2019-06-14 DIAGNOSIS — Z79899 Other long term (current) drug therapy: Secondary | ICD-10-CM | POA: Diagnosis not present

## 2019-06-14 DIAGNOSIS — C3490 Malignant neoplasm of unspecified part of unspecified bronchus or lung: Secondary | ICD-10-CM

## 2019-06-14 DIAGNOSIS — I10 Essential (primary) hypertension: Secondary | ICD-10-CM | POA: Insufficient documentation

## 2019-06-14 DIAGNOSIS — R918 Other nonspecific abnormal finding of lung field: Secondary | ICD-10-CM | POA: Diagnosis not present

## 2019-06-14 DIAGNOSIS — Z85118 Personal history of other malignant neoplasm of bronchus and lung: Secondary | ICD-10-CM | POA: Diagnosis not present

## 2019-06-14 DIAGNOSIS — Z902 Acquired absence of lung [part of]: Secondary | ICD-10-CM | POA: Insufficient documentation

## 2019-06-14 LAB — CMP (CANCER CENTER ONLY)
ALT: 14 U/L (ref 0–44)
AST: 18 U/L (ref 15–41)
Albumin: 4.2 g/dL (ref 3.5–5.0)
Alkaline Phosphatase: 122 U/L (ref 38–126)
Anion gap: 12 (ref 5–15)
BUN: 10 mg/dL (ref 8–23)
CO2: 23 mmol/L (ref 22–32)
Calcium: 8.6 mg/dL — ABNORMAL LOW (ref 8.9–10.3)
Chloride: 102 mmol/L (ref 98–111)
Creatinine: 0.69 mg/dL (ref 0.44–1.00)
GFR, Est AFR Am: >60 mL/min (ref >60)
GFR, Estimated: >60 mL/min (ref >60)
Glucose, Bld: 85 mg/dL (ref 70–99)
Potassium: 3.5 mmol/L (ref 3.5–5.1)
Sodium: 137 mmol/L (ref 135–145)
Total Bilirubin: 0.6 mg/dL (ref 0.3–1.2)
Total Protein: 7 g/dL (ref 6.5–8.1)

## 2019-06-14 LAB — CBC WITH DIFFERENTIAL (CANCER CENTER ONLY)
Abs Immature Granulocytes: 0.02 10*3/uL (ref 0.00–0.07)
Basophils Absolute: 0.1 10*3/uL (ref 0.0–0.1)
Basophils Relative: 1 %
Eosinophils Absolute: 0.1 10*3/uL (ref 0.0–0.5)
Eosinophils Relative: 1 %
HCT: 46.9 % — ABNORMAL HIGH (ref 36.0–46.0)
Hemoglobin: 15.8 g/dL — ABNORMAL HIGH (ref 12.0–15.0)
Immature Granulocytes: 0 %
Lymphocytes Relative: 32 %
Lymphs Abs: 2.2 10*3/uL (ref 0.7–4.0)
MCH: 31.7 pg (ref 26.0–34.0)
MCHC: 33.7 g/dL (ref 30.0–36.0)
MCV: 94.2 fL (ref 80.0–100.0)
Monocytes Absolute: 0.5 10*3/uL (ref 0.1–1.0)
Monocytes Relative: 7 %
Neutro Abs: 4.1 10*3/uL (ref 1.7–7.7)
Neutrophils Relative %: 59 %
Platelet Count: 290 10*3/uL (ref 150–400)
RBC: 4.98 MIL/uL (ref 3.87–5.11)
RDW: 13.2 % (ref 11.5–15.5)
WBC Count: 7 10*3/uL (ref 4.0–10.5)
nRBC: 0 % (ref 0.0–0.2)

## 2019-06-14 MED ORDER — SODIUM CHLORIDE (PF) 0.9 % IJ SOLN
INTRAMUSCULAR | Status: AC
  Filled 2019-06-14: qty 50

## 2019-06-14 MED ORDER — IOHEXOL 300 MG/ML  SOLN
75.0000 mL | Freq: Once | INTRAMUSCULAR | Status: AC | PRN
  Administered 2019-06-14: 75 mL via INTRAVENOUS

## 2019-06-16 ENCOUNTER — Inpatient Hospital Stay (HOSPITAL_BASED_OUTPATIENT_CLINIC_OR_DEPARTMENT_OTHER): Payer: Medicare Other | Admitting: Internal Medicine

## 2019-06-16 ENCOUNTER — Other Ambulatory Visit: Payer: Self-pay

## 2019-06-16 ENCOUNTER — Encounter: Payer: Self-pay | Admitting: Internal Medicine

## 2019-06-16 VITALS — BP 122/88 | HR 86 | Temp 99.8°F | Resp 18 | Ht 64.0 in | Wt 124.3 lb

## 2019-06-16 DIAGNOSIS — C3490 Malignant neoplasm of unspecified part of unspecified bronchus or lung: Secondary | ICD-10-CM

## 2019-06-16 DIAGNOSIS — Z85118 Personal history of other malignant neoplasm of bronchus and lung: Secondary | ICD-10-CM | POA: Diagnosis not present

## 2019-06-16 DIAGNOSIS — Z902 Acquired absence of lung [part of]: Secondary | ICD-10-CM | POA: Diagnosis not present

## 2019-06-16 DIAGNOSIS — Z79899 Other long term (current) drug therapy: Secondary | ICD-10-CM | POA: Diagnosis not present

## 2019-06-16 DIAGNOSIS — I10 Essential (primary) hypertension: Secondary | ICD-10-CM | POA: Diagnosis not present

## 2019-06-16 DIAGNOSIS — E041 Nontoxic single thyroid nodule: Secondary | ICD-10-CM | POA: Diagnosis not present

## 2019-06-16 NOTE — Progress Notes (Signed)
[place] Telephone:([phone]) [phone]   [phone]  OFFICE PROGRESS NOTE  [name], MD [place] [address]  DIAGNOSIS:  1) stage IA, T1 a, N0, M0) non-small cell lung cancer, adenocarcinoma diagnosed in 2013. 2) stage Ib (T2, and 0, M0) non-small cell lung cancer, adenocarcinoma measuring 1.3 cm with visceral pleural invasion and focal lymphovascular invasion diagnosed and June 2019.  PRIOR THERAPY: 1) status post right upper lobectomy in April 2013 2) She status post left lower lobe superior segmentectomy under the care of Dr. Roxan Hockey on April 08, 2018.  CURRENT THERAPY: Observation.  INTERVAL HISTORY: Valerie Mosley 68 y.o. female returns to the clinic today for follow-up visit.  The patient is feeling fine today with no concerning complaints except for mild fatigue.  She denied having any chest pain, shortness of breath, cough or hemoptysis.  She denied having any fever or chills.  She has no nausea, vomiting, diarrhea or constipation.  She has no headache or visual changes.  She has no weight loss or night sweats.  The patient had repeat CT scan of the chest performed recently and she is here for evaluation and discussion of her scan results.  MEDICAL HISTORY: Past Medical History:  Diagnosis Date   Cancer ([place])    Stage IA non-small cell lung cancer, left upper lobectomy 12/2011   COPD (chronic obstructive pulmonary disease) (H[facility])    Cough 07/29/11   started coughing up blood this am   Cough    Hypertension    Kyphoscoliosis    PONV (postoperative nausea and vomiting)    Recurrent upper respiratory infection (URI)     ALLERGIES:  has No Known Allergies.  MEDICATIONS:  Current Outpatient Medications  Medication Sig Dispense Refill   acetaminophen (TYLENOL) 500 MG tablet Take 1,000 mg by mouth every 6 (six) hours as needed for moderate pain or headache.     albuterol (VENTOLIN HFA) 108 (90  Base) MCG/ACT inhaler Inhale 2 puffs into the lungs every 4 (four) hours as needed for wheezing or shortness of breath. For shortness of breath 8.51 g 5   alendronate (FOSAMAX) 70 MG tablet Take 70 mg by mouth every Friday.      amLODipine (NORVASC) 10 MG tablet Take 10 mg by mouth daily.       colestipol (COLESTID) 1 g tablet Take 1 g by mouth daily as needed (for diarrhea).      fluticasone (FLONASE) 50 MCG/ACT nasal spray Place 2 sprays into both nostrils daily as needed for allergies. 16 g 2   Glycopyrrolate-Formoterol (BEVESPI AEROSPHERE) 9-4.8 MCG/ACT AERO Inhale 2 puffs into the lungs 2 (two) times daily. 1 Inhaler 11   montelukast (SINGULAIR) 10 MG tablet Take 1 tablet (10 mg total) by mouth at bedtime. 30 tablet 5   sertraline (ZOLOFT) 50 MG tablet Take 50 mg by mouth at bedtime.     No current facility-administered medications for this visit.     SURGICAL HISTORY:  Past Surgical History:  Procedure Laterality Date   BRONCHOSCOPY     CATARACT EXTRACTION, BILATERAL  2016   EYE SURGERY     INSERTION OF IBV VALVE Left 04/16/2018   Procedure: INSERTION OF INTERBRONCHIAL VALVE (IBV);  Surgeon: [name], MD;  Location: [facility];  Service: Thoracic;  Laterality: Left;   INSERTION OF IBV VALVE N/A 06/25/2018   Procedure: REMOVAL OF THREE INTERBRONCHIAL VALVE (IBV);  Surgeon: [name], MD;  Location: [facility];  Service: Thoracic;  Laterality: N/A;   LOBECTOMY  12/17/2011   Procedure: LOBECTOMY;  Surgeon: [name], MD;  Location: [facility];  Service: Thoracic;  Laterality: Left;  (L)VATS, WEDGE RESECTION, LEFT UPPER LOBECTOMY,  Multiple node biopsies   REMOVAL OF PLEURAL DRAINAGE CATHETER Left 05/06/2018   Procedure: REMOVAL OF CHEST TUBE;  Surgeon: [name], MD;  Location: [facility];  Service: Thoracic;  Laterality: Left;   THYROID SURGERY  1980   1/2 thyroid removed - benign   URETHRA SURGERY     VIDEO ASSISTED THORACOSCOPY Left 04/08/2018    Procedure: REDO LEFT VIDEO ASSISTED THORACOSCOPY with left lower lobe wedge resection;  Surgeon: [name], MD;  Location: [facility];  Service: Thoracic;  Laterality: Left;   VIDEO BRONCHOSCOPY N/A 04/16/2018   Procedure: VIDEO BRONCHOSCOPY;  Surgeon: [name], MD;  Location: [facility];  Service: Thoracic;  Laterality: N/A;   VIDEO BRONCHOSCOPY N/A 06/25/2018   Procedure: VIDEO BRONCHOSCOPY;  Surgeon: [name], MD;  Location: [facility];  Service: Thoracic;  Laterality: N/A;    REVIEW OF SYSTEMS:  A comprehensive review of systems was negative except for: Constitutional: positive for fatigue   PHYSICAL EXAMINATION: General appearance: alert, cooperative, fatigued and no distress Head: Normocephalic, without obvious abnormality, atraumatic Neck: no adenopathy, no JVD, supple, symmetrical, trachea midline and thyroid not enlarged, symmetric, no tenderness/mass/nodules Lymph nodes: Cervical, supraclavicular, and axillary nodes normal. Resp: clear to auscultation bilaterally Back: symmetric, no curvature. ROM normal. No CVA tenderness. Cardio: regular rate and rhythm, S1, S2 normal, no murmur, click, rub or gallop GI: soft, non-tender; bowel sounds normal; no masses,  no organomegaly Extremities: extremities normal, atraumatic, no cyanosis or edema  ECOG PERFORMANCE STATUS: 1 - Symptomatic but completely ambulatory  Blood pressure 122/88, pulse 86, temperature 99.8 F (37.7 C), temperature source Oral, resp. rate 18, height 5\' 4"  (1.626 m), weight 124 lb 4.8 oz (56.4 kg), SpO2 96 %.  LABORATORY DATA: Lab Results  Component Value Date   WBC 7.0 06/14/2019   HGB 15.8 (H) 06/14/2019   HCT 46.9 (H) 06/14/2019   MCV 94.2 06/14/2019   PLT 290 06/14/2019      Chemistry      Component Value Date/Time   NA 137 06/14/2019 1158   K 3.5 06/14/2019 1158   CL 102 06/14/2019 1158   CO2 23 06/14/2019 1158   BUN 10 06/14/2019 1158   CREATININE 0.69 06/14/2019 1158       Component Value Date/Time   CALCIUM 8.6 (L) 06/14/2019 1158   ALKPHOS 122 06/14/2019 1158   AST 18 06/14/2019 1158   ALT 14 06/14/2019 1158   BILITOT 0.6 06/14/2019 1158       RADIOGRAPHIC STUDIES: Ct Chest W Contrast  Result Date: 06/14/2019 CLINICAL DATA:  Left upper lobectomy 12/17/2011 for stage IA lung adenocarcinoma. Status post superior segmentectomy in the left lower lobe for stage IB lung adenocarcinoma 04/08/2018. EXAM: CT CHEST WITH CONTRAST TECHNIQUE: Multidetector CT imaging of the chest was performed during intravenous contrast administration. CONTRAST:  28mL OMNIPAQUE IOHEXOL 300 MG/ML  SOLN COMPARISON:  12/01/2018 chest CT. FINDINGS: Cardiovascular: Normal heart size. No significant pericardial effusion/thickening. Three-vessel coronary atherosclerosis. Atherosclerotic nonaneurysmal thoracic aorta. Normal caliber pulmonary arteries. No central pulmonary emboli. Mediastinum/Nodes: Heterogeneous right thyroid lobe with suggestion of subcentimeter hypodense right thyroid nodules, not appreciably changed. Apparent left hemithyroidectomy. Unremarkable esophagus. No axillary adenopathy. Mildly enlarged 1.2 cm low right paratracheal node (series 2/image 55), previously  1.2 cm, stable. No new pathologically enlarged mediastinal nodes. No pathologically enlarged hilar nodes. Lungs/Pleura: Status post left upper lobectomy and superior segment left lower lobe resection. No pneumothorax. No pleural effusion. Severe centrilobular and paraseptal emphysema. No acute consolidative airspace disease. Peripheral basilar left lower lobe 1.0 cm solid pulmonary nodule (series 5/image 96) and basilar left lower lobe 1.1 x 0.5 cm solid pulmonary nodule (series 5/image 91) both remain stable since at least 02/10/2018 chest CT. Scattered solid right upper lobe pulmonary nodules, largest 7 mm (series 5/image 58), all stable since at least 02/10/2018 chest CT. No new significant pulmonary nodules. Upper abdomen:  Several simple cysts scattered throughout the visualized liver, largest 3.2 cm in the anterior right liver lobe. Several subcentimeter hypodense lesions scattered in the visualized liver are too small to characterize and are not appreciably changed. Nonobstructing 4 mm lower left renal stone. Simple lower left renal cysts are partially visualized, largest 1.4 cm. Musculoskeletal: No aggressive appearing focal osseous lesions. Moderate thoracic spondylosis. Stable exaggerated thoracic kyphosis. IMPRESSION: 1. Stable postsurgical changes from left upper lobectomy and superior segmentectomy in the left lower lobe, with no findings of local tumor recurrence in the left lung. 2. No findings suspicious for metastatic disease in the chest. Bilateral pulmonary nodules remain stable. Mildly enlarged paratracheal lymph node remains stable. Aortic Atherosclerosis (ICD10-I70.0) and Emphysema (ICD10-J43.9). Electronically Signed   By: Ilona Sorrel M.D.   On: 06/14/2019 15:07    ASSESSMENT AND PLAN: This is a very pleasant 68 years old white female with history of a stage Ia non-small cell lung cancer, adenocarcinoma status post right upper lobectomy in April 2013 in addition to stage Ib non-small cell lung cancer, adenocarcinoma with visceral pleural invasion diagnosed in June 2019 status post left lower lobe superior segmentectomy under the care of Dr. Roxan Hockey.  She is currently on observation. The patient is doing fine today with no concerning complaints. She had repeat CT scan of the chest performed recently.  I personally and independently reviewed the scans and discussed the results with the patient today. Her scan showed no concerning findings for disease recurrence or progression. I recommended for her to continue on observation with repeat CT scan of the chest in 6 months. She was advised to call immediately if she has any concerning symptoms in the interval. The patient voices understanding of current  disease status and treatment options and is in agreement with the current care plan.  All questions were answered. The patient knows to call the clinic with any problems, questions or concerns. We can certainly see the patient much sooner if necessary.  I spent 10 minutes counseling the patient face to face. The total time spent in the appointment was 15 minutes.  Disclaimer: This note was dictated with voice recognition software. Similar sounding words can inadvertently be transcribed and may not be corrected upon review.

## 2019-06-17 ENCOUNTER — Telehealth: Payer: Self-pay | Admitting: Internal Medicine

## 2019-06-17 NOTE — Telephone Encounter (Signed)
Scheduled apt per 9/30 los - mailed letter with appt date and time

## 2019-06-28 DIAGNOSIS — I10 Essential (primary) hypertension: Secondary | ICD-10-CM | POA: Diagnosis not present

## 2019-06-28 DIAGNOSIS — E785 Hyperlipidemia, unspecified: Secondary | ICD-10-CM | POA: Diagnosis not present

## 2019-07-01 DIAGNOSIS — Z0000 Encounter for general adult medical examination without abnormal findings: Secondary | ICD-10-CM | POA: Diagnosis not present

## 2019-07-01 DIAGNOSIS — Z1389 Encounter for screening for other disorder: Secondary | ICD-10-CM | POA: Diagnosis not present

## 2019-07-01 DIAGNOSIS — R768 Other specified abnormal immunological findings in serum: Secondary | ICD-10-CM | POA: Diagnosis not present

## 2019-07-01 DIAGNOSIS — C3492 Malignant neoplasm of unspecified part of left bronchus or lung: Secondary | ICD-10-CM | POA: Diagnosis not present

## 2019-07-01 DIAGNOSIS — I10 Essential (primary) hypertension: Secondary | ICD-10-CM | POA: Diagnosis not present

## 2019-07-01 DIAGNOSIS — E785 Hyperlipidemia, unspecified: Secondary | ICD-10-CM | POA: Diagnosis not present

## 2019-07-01 DIAGNOSIS — F325 Major depressive disorder, single episode, in full remission: Secondary | ICD-10-CM | POA: Diagnosis not present

## 2019-07-01 DIAGNOSIS — M810 Age-related osteoporosis without current pathological fracture: Secondary | ICD-10-CM | POA: Diagnosis not present

## 2019-07-01 DIAGNOSIS — J449 Chronic obstructive pulmonary disease, unspecified: Secondary | ICD-10-CM | POA: Diagnosis not present

## 2019-07-07 ENCOUNTER — Telehealth: Payer: Self-pay | Admitting: Emergency Medicine

## 2019-07-07 NOTE — Telephone Encounter (Signed)
Spoke with pt. She has been scheduled for her flu shot on 07/09/2019 at 1130. Nothing further was needed.

## 2019-07-09 ENCOUNTER — Ambulatory Visit (INDEPENDENT_AMBULATORY_CARE_PROVIDER_SITE_OTHER): Payer: Medicare Other

## 2019-07-09 ENCOUNTER — Other Ambulatory Visit: Payer: Self-pay

## 2019-07-09 DIAGNOSIS — Z23 Encounter for immunization: Secondary | ICD-10-CM | POA: Diagnosis not present

## 2019-08-09 DIAGNOSIS — C44311 Basal cell carcinoma of skin of nose: Secondary | ICD-10-CM | POA: Diagnosis not present

## 2019-08-11 ENCOUNTER — Ambulatory Visit (INDEPENDENT_AMBULATORY_CARE_PROVIDER_SITE_OTHER): Payer: Medicare Other | Admitting: Emergency Medicine

## 2019-08-11 ENCOUNTER — Other Ambulatory Visit: Payer: Self-pay

## 2019-08-11 ENCOUNTER — Encounter: Payer: Self-pay | Admitting: Emergency Medicine

## 2019-08-11 DIAGNOSIS — J301 Allergic rhinitis due to pollen: Secondary | ICD-10-CM

## 2019-08-11 DIAGNOSIS — J449 Chronic obstructive pulmonary disease, unspecified: Secondary | ICD-10-CM

## 2019-08-11 DIAGNOSIS — C3490 Malignant neoplasm of unspecified part of unspecified bronchus or lung: Secondary | ICD-10-CM | POA: Diagnosis not present

## 2019-08-11 NOTE — Assessment & Plan Note (Signed)
Please continue Bevespi 2 puffs twice daily as you have been taking it. Keep your albuterol available to use 2 puffs if needed for shortness of breath, chest tightness, wheezing. Flu shot and pneumonia shots are both up-to-date. You will be a good candidate to receive the coronavirus-19 vaccine when it becomes available.  We will discuss further at that time. Follow with Dr Lamonte Sakai in 6 months or sooner if you have any problems

## 2019-08-11 NOTE — Assessment & Plan Note (Signed)
Clinically and radiographically stable, last scan September.  She is following with Dr. Julien Nordmann, Dr. Roxan Hockey and next scan will be in March

## 2019-08-11 NOTE — Assessment & Plan Note (Signed)
Currently on Singulair, using her fluticasone nasal spray only as needed.  Encouraged her to increase to 2 sprays each nostril once daily if her allergy burden, mucus burden, cough increase.

## 2019-08-11 NOTE — Patient Instructions (Addendum)
Please continue Bevespi 2 puffs twice daily as you have been taking it. Keep your albuterol available to use 2 puffs if needed for shortness of breath, chest tightness, wheezing. Flu shot and pneumonia shots are both up-to-date. You will be a good candidate to receive the coronavirus-19 vaccine when it becomes available.  We will discuss further at that time. Get your CT scan of the chest and follow-up with Dr. Julien Nordmann as planned Follow with Dr Lamonte Sakai in 6 months or sooner if you have any problems

## 2019-08-11 NOTE — Progress Notes (Signed)
  Subjective:    Patient ID: Valerie Mosley, female    DOB: [birth year], 68 y.o.   MRN: [id number] HPI  ROV 08/11/2019 --68 year old woman with a history of COPD, allergic rhinitis and adenocarcinoma of the lung for which she has required a left upper lobectomy and a left lower lobe superior segmentectomy (Dr. Roxan Hockey, also followed by Dr. Julien Nordmann).  She had a CT scan done 06/14/2019 which I reviewed, was overall stable and remains on observation.  Next scan planned in 6 months.  Currently managed on Bevespi.  She has albuterol available which she uses rarely, about once a month. She is on Singulair and fluticasone nasal spray prn for her allergic rhinitis.  She reports that her breathing has been impacted most by wearing her mask (she is still wearing it). She cares for her grandchild 2 days a week, has a stable exercise tolerance. Her cough is stable, does sometimes have paroxysms of cough 2x a week. Usually dry. Her last flare was over 3 years ago. Flu and PNA shots are up to date.   Most recent PFT 04/06/2018 reviewed, show FEV1 1.14 L (49% predicted) with a positive bronchodilator response.  Objective:   Vitals:   08/11/19 1030  BP: 120/80  Pulse: 90  SpO2: 97%  Weight: 125 lb 6.4 oz (56.9 kg)  Height: 5\' 4"  (1.626 m)   Gen: Pleasant, well-nourished, in no distress,  normal affect  ENT: No lesions,  mouth clear,  oropharynx clear, no postnasal drip. She has a dressing on her R nose post-basal cell removal.   Neck: No JVD, no stridor  Lungs: No use of accessory muscles, mild insp coarse BS bilaterally, no wheezing  Cardiovascular: RRR, heart sounds normal, no murmur, no peripheral edema  Musculoskeletal: No deformities, no cyanosis or clubbing  Neuro: alert, non focal  Skin: Warm, no lesions or rashes    COPD (chronic obstructive pulmonary disease) Please continue Bevespi 2 puffs twice daily as you have been taking it. Keep your albuterol available to use 2 puffs if needed  for shortness of breath, chest tightness, wheezing. Flu shot and pneumonia shots are both up-to-date. You will be a good candidate to receive the coronavirus-19 vaccine when it becomes available.  We will discuss further at that time. Follow with Dr Lamonte Sakai in 6 months or sooner if you have any problems  Non-small cell lung cancer ([place]) Clinically and radiographically stable, last scan September.  She is following with Dr. Julien Nordmann, Dr. Roxan Hockey and next scan will be in March  Allergic rhinitis Currently on Singulair, using her fluticasone nasal spray only as needed.  Encouraged her to increase to 2 sprays each nostril once daily if her allergy burden, mucus burden, cough increase.  Baltazar Apo, MD, PhD 08/11/2019, 10:47 AM [facility] [phone] or if no answer [phone]

## 2019-09-27 DIAGNOSIS — H43813 Vitreous degeneration, bilateral: Secondary | ICD-10-CM | POA: Diagnosis not present

## 2019-09-27 DIAGNOSIS — H5213 Myopia, bilateral: Secondary | ICD-10-CM | POA: Diagnosis not present

## 2019-09-27 DIAGNOSIS — Z961 Presence of intraocular lens: Secondary | ICD-10-CM | POA: Diagnosis not present

## 2019-09-27 DIAGNOSIS — H26493 Other secondary cataract, bilateral: Secondary | ICD-10-CM | POA: Diagnosis not present

## 2019-10-06 ENCOUNTER — Telehealth: Payer: Self-pay | Admitting: Emergency Medicine

## 2019-10-21 DIAGNOSIS — L905 Scar conditions and fibrosis of skin: Secondary | ICD-10-CM | POA: Diagnosis not present

## 2019-10-21 DIAGNOSIS — D225 Melanocytic nevi of trunk: Secondary | ICD-10-CM | POA: Diagnosis not present

## 2019-10-21 DIAGNOSIS — L821 Other seborrheic keratosis: Secondary | ICD-10-CM | POA: Diagnosis not present

## 2019-10-21 DIAGNOSIS — Z85828 Personal history of other malignant neoplasm of skin: Secondary | ICD-10-CM | POA: Diagnosis not present

## 2019-10-21 DIAGNOSIS — D1801 Hemangioma of skin and subcutaneous tissue: Secondary | ICD-10-CM | POA: Diagnosis not present

## 2019-11-07 ENCOUNTER — Ambulatory Visit: Payer: 59 | Attending: Internal Medicine

## 2019-11-07 DIAGNOSIS — Z23 Encounter for immunization: Secondary | ICD-10-CM | POA: Insufficient documentation

## 2019-11-07 NOTE — Progress Notes (Signed)
   Covid-19 Vaccination Clinic  Name:  Valerie Mosley    MRN: [id number] DOB: Aug 18, 1951  11/07/2019  [name] was observed post Covid-19 immunization for 15 minutes without incidence. She was provided with Vaccine Information Sheet and instruction to access the V-Safe system.   [name] was instructed to call 911 with any severe reactions post vaccine: Marland Kitchen Difficulty breathing  . Swelling of your face and throat  . A fast heartbeat  . A bad rash all over your body  . Dizziness and weakness    Immunizations Administered    Name Date Dose VIS Date Route   Pfizer COVID-19 Vaccine 11/07/2019  9:37 AM 0.3 mL 08/27/2019 Intramuscular   Manufacturer: [place]   Lot: [id number]   [place]: [id number]

## 2019-12-01 ENCOUNTER — Ambulatory Visit: Payer: 59 | Attending: Internal Medicine

## 2019-12-01 DIAGNOSIS — Z23 Encounter for immunization: Secondary | ICD-10-CM

## 2019-12-01 NOTE — Progress Notes (Signed)
   Covid-19 Vaccination Clinic  Name:  Valerie Mosley    MRN: [id number] DOB: 1951-05-19  12/01/2019  Valerie Mosley was observed post Covid-19 immunization for 15 minutes without incident. She was provided with Vaccine Information Sheet and instruction to access the V-Safe system.   Valerie Mosley was instructed to call 911 with any severe reactions post vaccine: Marland Kitchen Difficulty breathing  . Swelling of face and throat  . A fast heartbeat  . A bad rash all over body  . Dizziness and weakness   Immunizations Administered    Name Date Dose VIS Date Route   Pfizer COVID-19 Vaccine 12/01/2019  2:55 PM 0.3 mL 08/27/2019 Intramuscular   Manufacturer: [place]   Lot: [id number]   [place]: [id number]

## 2019-12-07 ENCOUNTER — Encounter: Payer: Medicare Other | Admitting: Thoracic Surgery (Cardiothoracic Vascular Surgery)

## 2019-12-10 ENCOUNTER — Telehealth: Payer: Self-pay | Admitting: Internal Medicine

## 2019-12-10 NOTE — Telephone Encounter (Signed)
Called and spoke with patient to confirm request to cancel 3/29 and 3/31 appts.

## 2019-12-13 ENCOUNTER — Inpatient Hospital Stay: Payer: Medicare Other

## 2019-12-15 ENCOUNTER — Inpatient Hospital Stay: Payer: Medicare Other | Admitting: Internal Medicine

## 2019-12-23 ENCOUNTER — Telehealth: Payer: Self-pay | Admitting: Emergency Medicine

## 2019-12-23 DIAGNOSIS — C3490 Malignant neoplasm of unspecified part of unspecified bronchus or lung: Secondary | ICD-10-CM

## 2019-12-23 NOTE — Telephone Encounter (Signed)
Spoke with pt, she would like Dr. Lamonte Sakai to order her CT chest and not Dr. Julien Nordmann. She wants the CT done before she schedules an appt with RB. RN are you willing to order CT for pt? Please advise,

## 2019-12-24 NOTE — Telephone Encounter (Signed)
aTC patient , patient hung up on me atc again x2 busy.

## 2019-12-24 NOTE — Telephone Encounter (Signed)
OK to order CT chest without contrast to follow lung cancer.

## 2019-12-28 NOTE — Telephone Encounter (Signed)
Spoke with pt. She is aware that RB is okay with ordering this CT. Order has been placed. Nothing further needed.

## 2020-01-03 ENCOUNTER — Other Ambulatory Visit: Payer: Self-pay | Admitting: Emergency Medicine

## 2020-01-14 ENCOUNTER — Ambulatory Visit
Admission: RE | Admit: 2020-01-14 | Discharge: 2020-01-14 | Disposition: A | Discharge status: Home / Self Care | Payer: 59 | Source: Ambulatory Visit | Attending: Emergency Medicine | Admitting: Emergency Medicine

## 2020-01-14 DIAGNOSIS — C3490 Malignant neoplasm of unspecified part of unspecified bronchus or lung: Secondary | ICD-10-CM

## 2020-01-14 DIAGNOSIS — R918 Other nonspecific abnormal finding of lung field: Secondary | ICD-10-CM | POA: Diagnosis not present

## 2020-01-17 ENCOUNTER — Telehealth: Payer: Self-pay | Admitting: Emergency Medicine

## 2020-01-17 NOTE — Telephone Encounter (Signed)
Dr. Lamonte Sakai please review CT results.

## 2020-01-19 NOTE — Telephone Encounter (Signed)
Spoke with pt. She is aware of results. Nothing further was needed.  

## 2020-01-19 NOTE — Telephone Encounter (Signed)
Please let her know that her CT chest is stable - no change in her scarring or small pulmonary nodular disease. No evidence to support recurrence of cancer. This is good news.

## 2020-01-19 NOTE — Telephone Encounter (Signed)
Spoke with the pt and advised we are waiting on Dr Lamonte Sakai to look at the CT results  Please advise, thanks!

## 2020-01-19 NOTE — Telephone Encounter (Signed)
Pt calling back regarding CT results. 2nd call. [phone]

## 2020-02-10 ENCOUNTER — Other Ambulatory Visit: Payer: Self-pay

## 2020-02-10 ENCOUNTER — Ambulatory Visit (INDEPENDENT_AMBULATORY_CARE_PROVIDER_SITE_OTHER): Payer: Medicare Other | Admitting: Emergency Medicine

## 2020-02-10 ENCOUNTER — Encounter: Payer: Self-pay | Admitting: Emergency Medicine

## 2020-02-10 DIAGNOSIS — J301 Allergic rhinitis due to pollen: Secondary | ICD-10-CM

## 2020-02-10 DIAGNOSIS — C3490 Malignant neoplasm of unspecified part of unspecified bronchus or lung: Secondary | ICD-10-CM

## 2020-02-10 DIAGNOSIS — J449 Chronic obstructive pulmonary disease, unspecified: Secondary | ICD-10-CM

## 2020-02-10 NOTE — Progress Notes (Signed)
  Subjective:    Patient ID: Valerie Mosley, female    DOB: Jun 12, 1951, 69 y.o.   MRN: [id number] HPI  ROV 08/11/2019 --68 year old woman with a history of COPD, allergic rhinitis and adenocarcinoma of the lung for which she has required a left upper lobectomy and a left lower lobe superior segmentectomy (Dr. Roxan Hockey, also followed by Dr. Julien Nordmann).  She had a CT scan done 06/14/2019 which I reviewed, was overall stable and remains on observation.  Next scan planned in 6 months.  Currently managed on Bevespi.  She has albuterol available which she uses rarely, about once a month. She is on Singulair and fluticasone nasal spray prn for her allergic rhinitis.  She reports that her breathing has been impacted most by wearing her mask (she is still wearing it). She cares for her grandchild 2 days a week, has a stable exercise tolerance. Her cough is stable, does sometimes have paroxysms of cough 2x a week. Usually dry. Her last flare was over 3 years ago. Flu and PNA shots are up to date.   Most recent PFT 04/06/2018 reviewed, show FEV1 1.14 L (49% predicted) with a positive bronchodilator response.  ROV 02/10/20 --follow-up visit for 69 year old woman with a history of COPD, allergic rhinitis, adenocarcinoma of the lung.  She underwent a left upper lobe lobectomy (2013) and left lower lobe superior segmentectomy 2019 (Dr. Roxan Hockey, Dr. Julien Nordmann).  She underwent a repeat CT scan of the chest on 01/14/2020 which I have reviewed, shows no evidence of metastatic disease or recurrence.  She has stable bilateral pulmonary nodules and some mild paratracheal adenopathy that are unchanged.  Currently managed on Bevespi.  Has albuterol which she uses approximately.  No exacerbations or hospitalizations reported. Uses albuterol very rarely. She has occasional paroxysms of cough - non-productive. Remains on singulair, flonase prn.     Objective:   Vitals:   02/10/20 1550  BP: 108/60  Pulse: (!) 102  Temp: 98.2  F (36.8 C)  TempSrc: Oral  SpO2: 95%  Weight: 126 lb 9.6 oz (57.4 kg)  Height: 5\' 4"  (1.626 m)   Gen: Pleasant, well-nourished, in no distress,  normal affect  ENT: No lesions,  mouth clear,  oropharynx clear, no postnasal drip.    Neck: No JVD, no stridor  Lungs: No use of accessory muscles, scattered rhonchi, no wheeze.   Cardiovascular: RRR, heart sounds normal, no murmur, no peripheral edema  Musculoskeletal: No deformities, no cyanosis or clubbing  Neuro: alert, non focal  Skin: Warm, no lesions or rashes, some bruising on her forearms.     Non-small cell lung cancer ([place]) CT chest is stable. We will repeat in 6 months, October.  COPD (chronic obstructive pulmonary disease) No exacerbations. COVID-19 vaccine is up-to-date. Continue Bevespi, albuterol as needed  Allergic rhinitis Continue Singulair. Discussed using her Flonase more regularly since she continues to have occasional paroxysmal cough.  Baltazar Apo, MD, PhD 02/10/2020, 4:12 PM [facility] [phone] or if no answer [phone]

## 2020-02-10 NOTE — Assessment & Plan Note (Signed)
No exacerbations. COVID-19 vaccine is up-to-date. Continue Bevespi, albuterol as needed

## 2020-02-10 NOTE — Patient Instructions (Addendum)
Repeat CT chest in October 2021.  Continue Bevespi 2 puffs twice a day. Keep albuterol available to use 2 puffs if needed for shortness of breath, chest tightness, wheezing. Continue Singulair 10 mg each evening Consider taking your Flonase 2 puffs each nostril once daily every day if your cough, drainage progress. COVID vaccine up to date.  Follow with Dr Lamonte Sakai in October after your CT scan to review the results together.

## 2020-02-10 NOTE — Assessment & Plan Note (Signed)
Continue Singulair. Discussed using her Flonase more regularly since she continues to have occasional paroxysmal cough.

## 2020-02-10 NOTE — Assessment & Plan Note (Signed)
CT chest is stable. We will repeat in 6 months, October.

## 2020-05-04 DIAGNOSIS — H26491 Other secondary cataract, right eye: Secondary | ICD-10-CM | POA: Diagnosis not present

## 2020-06-12 DIAGNOSIS — C3492 Malignant neoplasm of unspecified part of left bronchus or lung: Secondary | ICD-10-CM | POA: Diagnosis not present

## 2020-06-12 DIAGNOSIS — K580 Irritable bowel syndrome with diarrhea: Secondary | ICD-10-CM | POA: Diagnosis not present

## 2020-06-12 DIAGNOSIS — F439 Reaction to severe stress, unspecified: Secondary | ICD-10-CM | POA: Diagnosis not present

## 2020-06-12 DIAGNOSIS — M810 Age-related osteoporosis without current pathological fracture: Secondary | ICD-10-CM | POA: Diagnosis not present

## 2020-06-12 DIAGNOSIS — F325 Major depressive disorder, single episode, in full remission: Secondary | ICD-10-CM | POA: Diagnosis not present

## 2020-06-12 DIAGNOSIS — E785 Hyperlipidemia, unspecified: Secondary | ICD-10-CM | POA: Diagnosis not present

## 2020-06-12 DIAGNOSIS — I10 Essential (primary) hypertension: Secondary | ICD-10-CM | POA: Diagnosis not present

## 2020-06-12 DIAGNOSIS — J449 Chronic obstructive pulmonary disease, unspecified: Secondary | ICD-10-CM | POA: Diagnosis not present

## 2020-07-01 ENCOUNTER — Other Ambulatory Visit: Payer: Self-pay

## 2020-07-01 ENCOUNTER — Ambulatory Visit: Payer: Medicare Other | Attending: Internal Medicine

## 2020-07-01 DIAGNOSIS — Z23 Encounter for immunization: Secondary | ICD-10-CM

## 2020-07-01 NOTE — Progress Notes (Signed)
   Covid-19 Vaccination Clinic  Name:  Valerie Mosley    MRN: [id number] DOB: 13-Mar-1951  07/01/2020  [name] was observed post Covid-19 immunization for 15 minutes without incident. She was provided with Vaccine Information Sheet and instruction to access the V-Safe system.   [name] was instructed to call 911 with any severe reactions post vaccine: Marland Kitchen Difficulty breathing  . Swelling of face and throat  . A fast heartbeat  . A bad rash all over body  . Dizziness and weakness

## 2020-07-05 ENCOUNTER — Telehealth: Payer: Self-pay | Admitting: Emergency Medicine

## 2020-07-05 DIAGNOSIS — C3490 Malignant neoplasm of unspecified part of unspecified bronchus or lung: Secondary | ICD-10-CM

## 2020-07-05 NOTE — Telephone Encounter (Signed)
Yes she needs a repeat CT scan of the chest without contrast to follow non-small cell lung cancer.

## 2020-07-05 NOTE — Telephone Encounter (Signed)
CT has been ordered.  Patient is aware and voiced her understanding.  Nothing further needed.

## 2020-07-05 NOTE — Telephone Encounter (Signed)
Spoke to patient, who is questioning if she needs CT prior to 08/08/2020 visit.  Per last OV nite, patient was to have CT 06/2020, however CT was not ordered.   Dr. Lamonte Sakai, please advise if you would like CT with or without contrast. Thanks

## 2020-08-01 ENCOUNTER — Ambulatory Visit
Admission: RE | Admit: 2020-08-01 | Discharge: 2020-08-01 | Disposition: A | Discharge status: Home / Self Care | Payer: Medicare Other | Source: Ambulatory Visit | Attending: Emergency Medicine | Admitting: Emergency Medicine

## 2020-08-01 DIAGNOSIS — C3492 Malignant neoplasm of unspecified part of left bronchus or lung: Secondary | ICD-10-CM | POA: Diagnosis not present

## 2020-08-01 DIAGNOSIS — I700 Atherosclerosis of aorta: Secondary | ICD-10-CM | POA: Diagnosis not present

## 2020-08-01 DIAGNOSIS — J432 Centrilobular emphysema: Secondary | ICD-10-CM | POA: Diagnosis not present

## 2020-08-01 DIAGNOSIS — I2510 Atherosclerotic heart disease of native coronary artery without angina pectoris: Secondary | ICD-10-CM | POA: Diagnosis not present

## 2020-08-01 DIAGNOSIS — C3490 Malignant neoplasm of unspecified part of unspecified bronchus or lung: Secondary | ICD-10-CM

## 2020-08-08 ENCOUNTER — Encounter: Payer: Self-pay | Admitting: Emergency Medicine

## 2020-08-08 ENCOUNTER — Ambulatory Visit (INDEPENDENT_AMBULATORY_CARE_PROVIDER_SITE_OTHER): Payer: Medicare Other | Admitting: Emergency Medicine

## 2020-08-08 ENCOUNTER — Other Ambulatory Visit: Payer: Self-pay

## 2020-08-08 VITALS — BP 118/70 | HR 93 | Temp 97.1°F | Ht 64.0 in | Wt 127.2 lb

## 2020-08-08 DIAGNOSIS — C3490 Malignant neoplasm of unspecified part of unspecified bronchus or lung: Secondary | ICD-10-CM | POA: Diagnosis not present

## 2020-08-08 DIAGNOSIS — Z23 Encounter for immunization: Secondary | ICD-10-CM

## 2020-08-08 DIAGNOSIS — J449 Chronic obstructive pulmonary disease, unspecified: Secondary | ICD-10-CM | POA: Diagnosis not present

## 2020-08-08 DIAGNOSIS — J301 Allergic rhinitis due to pollen: Secondary | ICD-10-CM

## 2020-08-08 NOTE — Assessment & Plan Note (Signed)
Following multiple pulmonary nodules that are unchanged going back to 11/2018.  No evidence of recurrence.  We will plan to repeat her CT chest in March 2022.  If stable then we will space out follow-up

## 2020-08-08 NOTE — Patient Instructions (Addendum)
Please continue your Bevespi 2 puffs twice a day. We will hold off on starting the [place] for now.  We could consider a medication like this at some point in the future depending on symptoms Please keep albuterol available use 2 puffs if needed for shortness of breath, chest tightness, wheezing. Please continue Singulair 10 mg each evening Keep your fluticasone nasal spray available use 2 sprays each nostril once a day if you need it for nasal congestion or allergy symptoms. Your CT scan of the chest from 08/01/2020 is stable.  This is good news. We will plan to repeat your CT chest in March 2022. Flu shot today COVID-19 vaccine is up-to-date Pneumonia shot is up-to-date Please follow Dr. Lamonte Sakai in March to review your CT scan or sooner if you have any problems.

## 2020-08-08 NOTE — Progress Notes (Signed)
Subjective:    Patient ID: Valerie Mosley, female    DOB: 09/08/1951, 69 y.o.   MRN: [id number] HPI  ROV 08/11/2019 --69 year old woman with a history of COPD, allergic rhinitis and adenocarcinoma of the lung for which she has required a left upper lobectomy and a left lower lobe superior segmentectomy (Dr. Roxan Hockey, also followed by Dr. Julien Nordmann).  She had a CT scan done 06/14/2019 which I reviewed, was overall stable and remains on observation.  Next scan planned in 6 months.  Currently managed on Bevespi.  She has albuterol available which she uses rarely, about once a month. She is on Singulair and fluticasone nasal spray prn for her allergic rhinitis.  She reports that her breathing has been impacted most by wearing her mask (she is still wearing it). She cares for her grandchild 2 days a week, has a stable exercise tolerance. Her cough is stable, does sometimes have paroxysms of cough 2x a week. Usually dry. Her last flare was over 3 years ago. Flu and PNA shots are up to date.   Most recent PFT 04/06/2018 reviewed, show FEV1 1.14 L (49% predicted) with a positive bronchodilator response.  ROV 02/10/20 --follow-up visit for 69 year old woman with a history of COPD, allergic rhinitis, adenocarcinoma of the lung.  She underwent a left upper lobe lobectomy (2013) and left lower lobe superior segmentectomy 2019 (Dr. Roxan Hockey, Dr. Julien Nordmann).  She underwent a repeat CT scan of the chest on 01/14/2020 which I have reviewed, shows no evidence of metastatic disease or recurrence.  She has stable bilateral pulmonary nodules and some mild paratracheal adenopathy that are unchanged.  Currently managed on Bevespi.  Has albuterol which she uses approximately.  No exacerbations or hospitalizations reported. Uses albuterol very rarely. She has occasional paroxysms of cough - non-productive. Remains on singulair, flonase prn.   ROV 08/08/20 --69 year old woman with history of COPD and allergic rhinitis.  She also  has history of adenocarcinoma of the lung with a left upper lobe lobectomy (2013), left lower lobe superior segmentectomy (2019).  Her most recent CT chest was 08/01/2020 which I have reviewed, shows small pulmonary nodule disease without any evidence of recurrence or concerning changes.  She is currently managed on Bevespi, Singulair, fluticasone nasal as needed. She believes that she is doing well, is still working. Her exertional tolerance is stable. No flares, no abx or pred - she cannot remember every requiring. She was given Arnuity by Dr Tamala Julian in September but hasn't started it.  COVID shots are up to date Flu shot today   Objective:   Vitals:   08/08/20 1626  BP: 118/70  Pulse: 93  Temp: (!) 97.1 F (36.2 C)  SpO2: 96%  Weight: 127 lb 3.2 oz (57.7 kg)  Height: 5\' 4"  (1.626 m)   Gen: Pleasant, well-nourished, in no distress,  normal affect  ENT: No lesions,  mouth clear,  oropharynx clear, no postnasal drip.    Neck: No JVD, no stridor  Lungs: No use of accessory muscles, scattered rhonchi, no wheeze.   Cardiovascular: RRR, heart sounds normal, no murmur, no peripheral edema  Musculoskeletal: No deformities, no cyanosis or clubbing  Neuro: alert, non focal  Skin: Warm, no lesions or rashes, some bruising on her forearms.     Non-small cell lung cancer ([place]) Following multiple pulmonary nodules that are unchanged going back to 11/2018.  No evidence of recurrence.  We will plan to repeat her CT chest in March 2022.  If stable then we will  space out follow-up  COPD (chronic obstructive pulmonary disease) Please continue your Bevespi 2 puffs twice a day. We will hold off on starting the [place] for now.  We could consider a medication like this at some point in the future depending on symptoms Please keep albuterol available use 2 puffs if needed for shortness of breath, chest tightness, wheezing. Flu shot today COVID-19 vaccine is up-to-date Pneumonia shot is  up-to-date  Allergic rhinitis Please continue Singulair 10 mg each evening Keep your fluticasone nasal spray available use 2 sprays each nostril once a day if you need it for nasal congestion or allergy symptoms.  Baltazar Apo, MD, PhD 08/08/2020, 5:26 PM [facility] [phone] or if no answer [phone]

## 2020-08-08 NOTE — Assessment & Plan Note (Signed)
Please continue your Bevespi 2 puffs twice a day. We will hold off on starting the [place] for now.  We could consider a medication like this at some point in the future depending on symptoms Please keep albuterol available use 2 puffs if needed for shortness of breath, chest tightness, wheezing. Flu shot today COVID-19 vaccine is up-to-date Pneumonia shot is up-to-date

## 2020-08-08 NOTE — Assessment & Plan Note (Signed)
Please continue Singulair 10 mg each evening Keep your fluticasone nasal spray available use 2 sprays each nostril once a day if you need it for nasal congestion or allergy symptoms.

## 2020-08-24 ENCOUNTER — Other Ambulatory Visit: Payer: Self-pay | Admitting: Emergency Medicine

## 2020-08-25 ENCOUNTER — Telehealth: Payer: Self-pay | Admitting: Emergency Medicine

## 2020-08-25 MED ORDER — ACYCLOVIR 5 % EX OINT: 1.0000 "application " | TOPICAL_OINTMENT | CUTANEOUS | 0 refills | Status: AC | PRN

## 2020-08-25 NOTE — Telephone Encounter (Signed)
Spoke with patient. She was calling to get a refill on her acyclovir ointment. She stated that RB prescribed it for her a few years for her fever blisters. She has not needed it until recently when the fever blisters have returned.   I looked her in chart, it looks like it was last prescribed in 2019. Acyclovir 5% ointment, 15g.   I advised her that since it was removed completed from her chart and was not mentioned in her last OV note, I would need to send a message to [place]. She verbalized understanding.   RB, please advise if you are ok with this RX? Thanks!

## 2020-08-25 NOTE — Telephone Encounter (Signed)
Called and spoke with pt letting her know that [place] was okay with [place] sending Rx for medication to the pharmacy for her but stated to her that she needs to call PCP for further refills of the acyclovir ointment and she verbalized understanding. Verified preferred pharmacy and sent Rx in for pt. Nothing further needed.

## 2020-08-25 NOTE — Telephone Encounter (Signed)
It's ok to reorder it for her. Let her know that in the future she can get it from her PCP since it's not really one of her respiratory meds.

## 2020-10-06 DIAGNOSIS — D2372 Other benign neoplasm of skin of left lower limb, including hip: Secondary | ICD-10-CM | POA: Diagnosis not present

## 2020-10-06 DIAGNOSIS — D485 Neoplasm of uncertain behavior of skin: Secondary | ICD-10-CM | POA: Diagnosis not present

## 2020-10-07 ENCOUNTER — Other Ambulatory Visit: Payer: Self-pay | Admitting: Physician Assistant

## 2020-10-07 DIAGNOSIS — N6320 Unspecified lump in the left breast, unspecified quadrant: Secondary | ICD-10-CM

## 2020-10-18 DIAGNOSIS — N6321 Unspecified lump in the left breast, upper outer quadrant: Secondary | ICD-10-CM | POA: Diagnosis not present

## 2020-10-18 DIAGNOSIS — N6489 Other specified disorders of breast: Secondary | ICD-10-CM | POA: Diagnosis not present

## 2020-10-18 DIAGNOSIS — R928 Other abnormal and inconclusive findings on diagnostic imaging of breast: Secondary | ICD-10-CM | POA: Diagnosis not present

## 2020-10-26 ENCOUNTER — Other Ambulatory Visit: Payer: Self-pay | Admitting: Hematology

## 2020-10-26 DIAGNOSIS — C50412 Malignant neoplasm of upper-outer quadrant of left female breast: Secondary | ICD-10-CM | POA: Diagnosis not present

## 2020-10-26 DIAGNOSIS — C50912 Malignant neoplasm of unspecified site of left female breast: Secondary | ICD-10-CM | POA: Diagnosis not present

## 2020-10-26 DIAGNOSIS — Z171 Estrogen receptor negative status [ER-]: Secondary | ICD-10-CM | POA: Diagnosis not present

## 2020-10-26 DIAGNOSIS — C50212 Malignant neoplasm of upper-inner quadrant of left female breast: Secondary | ICD-10-CM | POA: Diagnosis not present

## 2020-10-30 ENCOUNTER — Ambulatory Visit: Payer: Self-pay | Admitting: Surgery

## 2020-10-30 DIAGNOSIS — C50212 Malignant neoplasm of upper-inner quadrant of left female breast: Secondary | ICD-10-CM

## 2020-10-30 DIAGNOSIS — C50912 Malignant neoplasm of unspecified site of left female breast: Secondary | ICD-10-CM | POA: Diagnosis not present

## 2020-10-30 NOTE — H&P (Signed)
Valerie Mosley Appointment: 10/30/2020 2:20 PM Location: [facility] Patient #: [id number] DOB: 1951-08-13 Married / Language: Valerie Mosley / Race: White Female  History of Present Illness Valerie Moores A. Carlisa Eble MD; 10/30/2020 3:06 PM) Patient words: Patient presents for evaluation of left breast cancer. She felt a nodule about a month ago and her left upper breast. 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 Receptors are pending. No family history of breast cancer. She has no other complaints or other masses in either breast or nipple discharge bilaterally.  The patient is a 70 year old female.   Allergies Valerie Forehand, CNA; 10/30/2020 2:20 PM) No Known Drug Allergies [10/30/2020]: Allergies Reconciled  Medication History Valerie Forehand, CNA; 10/30/2020 2:21 PM) diazePAM (5MG  Tablet, Oral) Active. Acyclovir (5% Ointment, External) Active. amLODIPine Besylate (10MG  Tablet, Oral) Active. Amoxicillin (500MG  Tablet, Oral) Active. Amoxicillin (875MG  Tablet, Oral) Active. Arnuity Ellipta (50MCG/ACT Aero Pow Br Act, Inhalation) Active. Colestipol HCl (1GM Tablet, Oral) Active. Bevespi Aerosphere (9-4.8MCG/ACT Aerosol, Inhalation) Active. Montelukast Sodium (10MG  Tablet, Oral) Active. Sertraline HCl (50MG  Tablet, Oral) Active. Medications Reconciled     Physical Exam (Valerie Willig A. Renna Kilmer MD; 10/30/2020 3:07 PM)  Head and Neck Head-normocephalic, atraumatic with no lesions or palpable masses. Trachea-midline. Thyroid Gland Characteristics - normal size and consistency.  Chest and Lung Exam Note: scar noted  Breast Note: Left upper breast is a 1 cm mobile mass in the upper inner quadrant with bruising from biopsy. No other masses noted. Right breast is normal.  Cardiovascular Cardiovascular examination reveals  -normal heart sounds, regular rate and rhythm with no murmurs and normal pedal pulses bilaterally.  Neurologic Neurologic evaluation reveals -alert and oriented x 3 with no impairment of recent or remote memory. Mental Status-Normal.  Musculoskeletal Normal Exam - Left-Upper Extremity Strength Normal and Lower Extremity Strength Normal. Normal Exam - Right-Upper Extremity Strength Normal and Lower Extremity Strength Normal.  Lymphatic Axillary  General Axillary Region: Bilateral - Description - Normal. Tenderness - Non Tender.    Assessment & Plan (Valerie Whitworth A. Kashten Gowin MD; 10/30/2020 3:08 PM)  BREAST CANCER, LEFT (C50.912) Impression: left upper inner quadrant 1.2 cm IDC receptors pending   Refer to medical and radiation oncology Discussed breast conserving surgery versus mastectomy with reconstruction. Pros and cons as well as long-term expectation, recurrence rates and survival discussed. She is opted for left breast lumpectomy with left axillary sentinel lymph node mapping. Once her receptors are back we can reassess her need for lymph node mapping and she may be served with lumpectomy alone given her age over 47 but she is grade 3 and receptors will be useful to determine surgical plan. For now, we will plan on doing a lymph node and I discussed this with her and she is fine. Risk of lumpectomy include bleeding, infection, seroma, more surgery, use of seed/wire, wound care, cosmetic deformity and the need for other treatments, death , blood clots, death. Pt agrees to proceed. Risk of sentinel lymph node mapping include bleeding, infection, lymphedema, shoulder pain. stiffness, dye allergy. cosmetic deformity , blood clots, death, need for more surgery. Pt agrees to proceed.  Current Plans Pt Education - [facility] Breast Cancer Information Given - Valerie Mosley "Breast Journey" Package We discussed the staging and pathophysiology of breast cancer. We discussed all of the different options for  treatment for breast cancer including surgery, chemotherapy, radiation therapy,  Herceptin, and antiestrogen therapy. We discussed a sentinel lymph node biopsy as she does not appear to having lymph node involvement right now. We discussed the performance of that with injection of radioactive tracer and blue dye. We discussed that she would have an incision underneath her axillary hairline. We discussed that there is a bout a 10-20% chance of having a positive node with a sentinel lymph node biopsy and we will await the permanent pathology to make any other first further decisions in terms of her treatment. One of these options might be to return to the operating room to perform an axillary lymph node dissection. We discussed about a 1-2% risk lifetime of chronic shoulder pain as well as lymphedema associated with a sentinel lymph node biopsy. We discussed the options for treatment of the breast cancer which included lumpectomy versus a mastectomy. We discussed the performance of the lumpectomy with a wire placement. We discussed a 10-20% chance of a positive margin requiring reexcision in the operating room. We also discussed that she may need radiation therapy or antiestrogen therapy or both if she undergoes lumpectomy. We discussed the mastectomy and the postoperative care for that as well. We discussed that there is no difference in her survival whether she undergoes lumpectomy with radiation therapy or antiestrogen therapy versus a mastectomy. There is a slight difference in the local recurrence rate being 3-5% with lumpectomy and about 1% with a mastectomy. We discussed the risks of operation including bleeding, infection, possible reoperation. She understands her further therapy will be based on what her stages at the time of her operation.  Pt Education - [facility] breast cancer surgery: discussed with patient and provided information. Pt Education - [facility] Breast Biopsy HCI: discussed with patient and provided  information.

## 2020-10-30 NOTE — H&P (View-Only) (Signed)
Valerie Mosley Appointment: 10/30/2020 2:20 PM Location: [facility] Patient #: [id number] DOB: October 15, 1950 Married / Language: [name] / Race: White Female  History of Present Illness Valerie Moores A. Valerie Dobosz MD; 10/30/2020 3:06 PM) Patient words: Patient presents for evaluation of left breast cancer. She felt a nodule about a month ago and her left upper breast. 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 Receptors are pending. No family history of breast cancer. She has no other complaints or other masses in either breast or nipple discharge bilaterally.  The patient is a 70 year old female.   Allergies Valerie Forehand, CNA; 10/30/2020 2:20 PM) No Known Drug Allergies [10/30/2020]: Allergies Reconciled  Medication History Valerie Forehand, CNA; 10/30/2020 2:21 PM) diazePAM (5MG  Tablet, Oral) Active. Acyclovir (5% Ointment, External) Active. amLODIPine Besylate (10MG  Tablet, Oral) Active. Amoxicillin (500MG  Tablet, Oral) Active. Amoxicillin (875MG  Tablet, Oral) Active. Arnuity Ellipta (50MCG/ACT Aero Pow Br Act, Inhalation) Active. Colestipol HCl (1GM Tablet, Oral) Active. Bevespi Aerosphere (9-4.8MCG/ACT Aerosol, Inhalation) Active. Montelukast Sodium (10MG  Tablet, Oral) Active. Sertraline HCl (50MG  Tablet, Oral) Active. Medications Reconciled     Physical Exam (Valerie Rivest A. Tyshae Stair MD; 10/30/2020 3:07 PM)  Head and Neck Head-normocephalic, atraumatic with no lesions or palpable masses. Trachea-midline. Thyroid Gland Characteristics - normal size and consistency.  Chest and Lung Exam Note: scar noted  Breast Note: Left upper breast is a 1 cm mobile mass in the upper inner quadrant with bruising from biopsy. No other masses noted. Right breast is normal.  Cardiovascular Cardiovascular examination reveals  -normal heart sounds, regular rate and rhythm with no murmurs and normal pedal pulses bilaterally.  Neurologic Neurologic evaluation reveals -alert and oriented x 3 with no impairment of recent or remote memory. Mental Status-Normal.  Musculoskeletal Normal Exam - Left-Upper Extremity Strength Normal and Lower Extremity Strength Normal. Normal Exam - Right-Upper Extremity Strength Normal and Lower Extremity Strength Normal.  Lymphatic Axillary  General Axillary Region: Bilateral - Description - Normal. Tenderness - Non Tender.    Assessment & Plan (Valerie Pollan A. Hazelyn Kallen MD; 10/30/2020 3:08 PM)  BREAST CANCER, LEFT (C50.912) Impression: left upper inner quadrant 1.2 cm IDC receptors pending   Refer to medical and radiation oncology Discussed breast conserving surgery versus mastectomy with reconstruction. Pros and cons as well as long-term expectation, recurrence rates and survival discussed. She is opted for left breast lumpectomy with left axillary sentinel lymph node mapping. Once her receptors are back we can reassess her need for lymph node mapping and she may be served with lumpectomy alone given her age over 54 but she is grade 3 and receptors will be useful to determine surgical plan. For now, we will plan on doing a lymph node and I discussed this with her and she is fine. Risk of lumpectomy include bleeding, infection, seroma, more surgery, use of seed/wire, wound care, cosmetic deformity and the need for other treatments, death , blood clots, death. Pt agrees to proceed. Risk of sentinel lymph node mapping include bleeding, infection, lymphedema, shoulder pain. stiffness, dye allergy. cosmetic deformity , blood clots, death, need for more surgery. Pt agrees to proceed.  Current Plans Pt Education - [facility] Breast Cancer Information Given - [facility] "Breast Journey" Package We discussed the staging and pathophysiology of breast cancer. We discussed all of the different options for  treatment for breast cancer including surgery, chemotherapy, radiation therapy,  Herceptin, and antiestrogen therapy. We discussed a sentinel lymph node biopsy as she does not appear to having lymph node involvement right now. We discussed the performance of that with injection of radioactive tracer and blue dye. We discussed that she would have an incision underneath her axillary hairline. We discussed that there is a bout a 10-20% chance of having a positive node with a sentinel lymph node biopsy and we will await the permanent pathology to make any other first further decisions in terms of her treatment. One of these options might be to return to the operating room to perform an axillary lymph node dissection. We discussed about a 1-2% risk lifetime of chronic shoulder pain as well as lymphedema associated with a sentinel lymph node biopsy. We discussed the options for treatment of the breast cancer which included lumpectomy versus a mastectomy. We discussed the performance of the lumpectomy with a wire placement. We discussed a 10-20% chance of a positive margin requiring reexcision in the operating room. We also discussed that she may need radiation therapy or antiestrogen therapy or both if she undergoes lumpectomy. We discussed the mastectomy and the postoperative care for that as well. We discussed that there is no difference in her survival whether she undergoes lumpectomy with radiation therapy or antiestrogen therapy versus a mastectomy. There is a slight difference in the local recurrence rate being 3-5% with lumpectomy and about 1% with a mastectomy. We discussed the risks of operation including bleeding, infection, possible reoperation. She understands her further therapy will be based on what her stages at the time of her operation.  Pt Education - [facility]b breast cancer surgery: discussed with patient and provided information. Pt Education - [facility] Breast Biopsy HCI: discussed with patient and provided  information.

## 2020-11-02 ENCOUNTER — Telehealth: Payer: Self-pay | Admitting: Nurse Practitioner

## 2020-11-02 ENCOUNTER — Other Ambulatory Visit: Payer: Self-pay | Admitting: Surgery

## 2020-11-02 DIAGNOSIS — C50212 Malignant neoplasm of upper-inner quadrant of left female breast: Secondary | ICD-10-CM

## 2020-11-02 NOTE — Telephone Encounter (Signed)
Received a new patient breast referral from Dr. Brantley Stage at [facility]. Valerie Mosley has been cld and scheduled to see [name] on 2/23 at 145pm. Pt awre to arrive 15 minutes early.

## 2020-11-03 ENCOUNTER — Telehealth: Payer: Self-pay | Admitting: Hematology

## 2020-11-03 NOTE — Telephone Encounter (Signed)
Moved upcoming appointment to a later time and to Dr. Ernestina Penna schedule per provider's request. Patient is aware of changes.

## 2020-11-06 NOTE — Progress Notes (Signed)
[place]   [phone] [phone]   Clinic New Consult Note   Patient Care Team: [name], MD as PCP - General (Family Medicine) Collene Gobble, MD (Pulmonary Disease)  Date of Service:  11/08/2020   CHIEF COMPLAINTS/PURPOSE OF CONSULTATION:  Newly Diagnosed Malignant neoplasm of upper-inner quadrant of left breast  REFERRING PHYSICIAN:  Dr Brantley Stage  Oncology History Overview Note  Cancer Staging Non-small cell lung cancer [facility]) Staging form: Lung, AJCC 7th Edition - Clinical: Stage IA (Free text: Ia) - Signed by [name], MD on 12/28/2013 Laterality: Left Cancer stage: Ia - Pathologic: Ia - Signed by [name], MD on 12/28/2013 Laterality: Left Cancer stage: Ia    Non-small cell lung cancer ([place])  09/02/2011 Imaging   CT Chest  IMPRESSION:   1.  Interval clearing of right upper lobe pneumonia.  2.  Left upper lobe nodule is unchanged in the short interval from  07/29/2011.  Follow-up could be performed in 3 months to ensure  continued stability. This recommendation follows the consensus  statement: Guidelines for Management of Small Pulmonary Nodules  Detected on CT Scans:  A Statement from the [facility] as  published in Radiology 2005; 237:395-400.  Available online at:  [email or web address]  3.  Additional scattered pulmonary nodules can be reevaluated on  future imaging as well.    11/06/2011 Imaging   PET IMPRESSION:   1.  Mild hypermetabolism which projects minimally cephalad to, but  is felt to correspond to the left upper lobe lung nodule.  Although  not within malignant range, given the small size of the nodule,  malignancy cannot be excluded.  Possible mild interval enlargement  of the nodule since 09/02/2011.  Consider repeat standard chest CT  to confirm interval enlargement.  Especially if interval  enlargement has occurred, tissue sampling  would be suggested.  Alternatively, 19-month follow-up chest CT could be performed.  2.  No evidence of thoracic nodal or extrathoracic disease.  3. Vague hypermetabolism corresponding to a prominent right lobe of  the thyroid. Nonspecific.  Consider ultrasound correlation.IMPRESSION:   1.  Mild hypermetabolism which projects minimally cephalad to, but  is felt to correspond to the left upper lobe lung nodule.  Although  not within malignant range, given the small size of the nodule,  malignancy cannot be excluded.  Possible mild interval enlargement  of the nodule since 09/02/2011.  Consider repeat standard chest CT  to confirm interval enlargement.  Especially if interval  enlargement has occurred, tissue sampling would be suggested.  Alternatively, 75-month follow-up chest CT could be performed.  2.  No evidence of thoracic nodal or extrathoracic disease.  3. Vague hypermetabolism corresponding to a prominent right lobe of  the thyroid. Nonspecific.  Consider ultrasound correlation.   12/17/2011 Surgery   Thoracoscopy by Dr Roxan Hockey   Diagnosis 1. Lung, wedge biopsy/resection, left upper lobe - INVASIVE WELL-DIFFERENTIATED ADENOCARCINOMA, SPANNING 1.2 CM. - NO LYMPH VASCULAR INVASION IDENTIFIED. - PLEURA IS UNINVOLVED. - MARGINS ARE NEGATIVE. - SEE ONCOLOGY TEMPLATE. 2. Lymph node, biopsy, level 9 - ONE BENIGN LYMPH NODE WITH NO TUMOR SEEN (0/1). 3. Lymph node, biopsy, level 9 - ONE BENIGN LYMPH NODE WITH NO TUMOR SEEN (0/1). 4. Lymph node, biopsy, 11 - ONE BENIGN LYMPH NODE WITH NO TUMOR SEEN (0/1). 5. Lymph node, biopsy, 11 #2 - ONE BENIGN LYMPH NODE WITH NO TUMOR SEEN (0/1). 6. Lymph node, biopsy, 5 - ONE BENIGN LYMPH NODE WITH NO TUMOR SEEN (  0/1). 7. Lymph node, biopsy, 10 - ONE BENIGN LYMPH NODE WITH NO TUMOR SEEN (0/1). 8. Lymph node, biopsy, 11 #3 - ONE BENIGN LYMPH NODE WITH NO TUMOR SEEN (0/1). 9. Lung, resection (segmental or lobe), Remainder of left upper  lobe - BENIGN LUNG PARENCHYMA WITH CHRONIC INFLAMMATION AND INCREASED PULMONARY MACROPHAGES. - THREE BENIGN LYMPH NODES IDENTIFIED (0/3). - NO TUMOR SEEN.    01/08/2012 Initial Diagnosis   Non-small cell lung cancer ([place])   02/10/2018 Imaging   CT Chest IMPRESSION: 1. Enlarging irregular pleural-based solid 1.9 cm pulmonary nodule in the superior segment left lower lobe, suspicious for malignancy, either recurrent disease or a metachronous primary bronchogenic carcinoma. Consider PET-CT for further characterization. 2. Additional scattered bilateral pulmonary nodules are stable and considered benign. 3. No noncontrast CT evidence of thoracic adenopathy. 4. Three-vessel coronary atherosclerosis. 5. Nonobstructing left nephrolithiasis.   Aortic Atherosclerosis (ICD10-I70.0) and Emphysema (ICD10-J43.9).   02/18/2018 PET scan   IMPRESSION: 1. Hypermetabolic apical nodule in the superior segment left lower lobe is highly worrisome for bronchogenic carcinoma. 2. No additional areas of abnormal hypermetabolism in the neck, chest, abdomen or pelvis. 3. Aortic atherosclerosis (ICD10-170.0). Coronary artery calcification. 4.  Emphysema (ICD10-J43.9). 5. Left renal stone.     03/06/2018 Pathology Results   Lung Biopsy  Diagnosis Lung, needle/core biopsy(ies), Left Lower Lobe - ADENOCARCINOMA. Microscopic Comment There is likely sufficient tissue for additional studies if requested (block 1B). Dr. Lyndon Code has reviewed the case.   04/08/2018 Surgery   Left Video assisted Thoracoscopy by Dr Roxan Hockey  Diagnosis Lung, wedge biopsy/resection, Left Lower Lobe - INVASIVE ADENOCARCINOMA, MODERATELY DIFFERENTIATED, SPANNING 1.3 CM. - ADENOCARCINOMA INVOLVES VISCERAL PLEURA. - ONE BENIGN INTRAPULMONARY LYMPH NODE (0/1). - LYMPHOVASCULAR INVASION IS IDENTIFIED, FOCAL. - THE SURGICAL RESECTION MARGINS ARE NEGATIVE FOR CARCINOMA. - SEE ONCOLOGY TABLE BELOW.    08/01/2020 Imaging   CT  Chest  IMPRESSION: Stable postop changes from prior left upper lobectomy. Stable small bilateral pulmonary nodules. No new or progressive disease within the thorax.   Aortic Atherosclerosis (ICD10-I70.0) and Emphysema (ICD10-J43.9).   Malignant neoplasm of upper-inner quadrant of left breast in female, estrogen receptor negative ([place])  10/18/2020 Mammogram   M[facility]ogram 10/18/20 at [facility] FINDINGS:  Cc and MLO views of bilateral breasts are submitted. There is a  spiculated mass in the palpable area upper left breast. The right  breast is negative.   Targeted ultrasound is performed, showing 1.2 x 0.9 x 1.2 cm  spiculated hypoechoic mass at the left breast 11 o'clock 8 cm from  nipple palpable area. This correlates to the mammographic mass.  Ultrasound of the left axilla is negative.   IMPRESSION:  Highly suspicious findings.     10/26/2020 Initial Biopsy   Final Pathologic Diagnosis  10/26/20 at [facility] BREAST, LEFT 1:00 8 CM FROM NIPPLE, NEEDLE BIOPSY:              INVASIVE DUCTAL CARCINOMA, NOTTINGHAM GRADE 3. Comment   The Nottingham grade is 3 (3-tubule formation, 3-nuclear atypia, 3-mitoses).  This case was reviewed by Dr. Vicenta Dunning and she is in essential agreement with the above diagnosis.  Final Pathologic Diagnosis   Prognostic Markers in Cancer   Block Number:  [id number] Diagnosis:  Invasive ductal carcinoma     Results:   Estrogen Receptor:  Negative (<1%)   Progesterone Receptor:  Negative (<1%)   Her2:  Negative (score=1+)     Ki-67:  Percentage of tumor cells with nuclear positivity: 60 %  11/01/2020 Cancer Staging   Staging form: Breast, AJCC 8th Edition - Clinical stage from 11/01/2020: Stage IB (cT1c, cN0, cM0, G3, ER-, PR-, HER2-) - Signed by Malachy Mood, MD on 11/08/2020 Stage prefix: Initial diagnosis   11/08/2020 Initial Diagnosis   Malignant neoplasm of upper-inner quadrant of left breast in female, estrogen receptor negative (HCC)       HISTORY OF PRESENTING ILLNESS:  Valerie Mosley 70 y.o. female is a here because of newly diagnosed left breast cancer. The patient was referred by Dr Luisa Hart. The patient presents to the clinic today alone.  She felt her left breast mass since 09/30/20. She denies pain with palpation. She had been doing mammogram every 2 years, last in 2020. She has not had abnormal mammogram before. She was seen to have mass on 2022 mammogram and biopsy showed cancer. She plans to have surgery with Dr Luisa Hart on 11/21/20.  She notes she has bene having left scapular pain which has been intermittent for the past 2 weeks. This pain last occurred 3 days ago. She notes normal ROM. She denies stomach issues, bloating. She has SOB from her COPD. She notes her menopause was significant for hot flashes for 2 years.   Socially she is married and lives with her husband. She notes she has 1 adult son. They are aware of her health. 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 She quit smoking in 2013 after diagnosed with lung cancer. She will drink wine or beer in the evenings.  She has a PMHx of COPD. She required inhaler daily, not on oxygen or required hospitalization. She had lung cancer in 2015 in [facility], and 2019 with L[facility]. She required 2 lung surgeries for resection. She notes thyroid surgery in 1980 and cataract surgery. I reviewed her medication list with her. She notes her brother had brain tumor in his 10s, she had cousins with colon cancer and lung cancer and her father had unknown cancer.    GYN HISTORY  Menarchal: 11 LMP: 25 years ago Contraceptive: Yes, intermittently for 10 years.  HRT: No G1P1: First at 30, no breast feeding.     REVIEW OF SYSTEMS:  Constitutional: Denies fevers, chills or abnormal night sweats Eyes: Denies blurriness of vision, double vision or watery eyes Ears, nose, mouth, throat, and face: Denies mucositis or sore throat Respiratory: Denies cough (+) COPD Cardiovascular: Denies  palpitation, chest discomfort or lower extremity swelling Gastrointestinal:  Denies nausea, heartburn or change in bowel habits Skin: Denies abnormal skin rashes Lymphatics: Denies new lymphadenopathy or easy bruising Neurological:Denies numbness, tingling or new weaknesses Behavioral/Psych: Mood is stable, no new changes  All other systems were reviewed with the patient and are negative.  MEDICAL HISTORY:  Past Medical History:  Diagnosis Date  . Cancer (HCC)    Stage IA non-small cell lung cancer, left upper lobectomy 12/2011  . COPD (chronic obstructive pulmonary disease) (HCC)   . Cough 07/29/11   started coughing up blood this am  . Cough   . Hypertension   . Kyphoscoliosis   . PONV (postoperative nausea and vomiting)   . Recurrent upper respiratory infection (URI)     SURGICAL HISTORY: Past Surgical History:  Procedure Laterality Date  . BRONCHOSCOPY    . CATARACT EXTRACTION, BILATERAL  2016  . EYE SURGERY    . INSERTION OF IBV VALVE Left 04/16/2018   Procedure: INSERTION OF INTERBRONCHIAL VALVE (IBV);  Surgeon: Loreli Slot, MD;  Location: [facility] OR;  Service: Thoracic;  Laterality:  Left;  . INSERTION OF IBV VALVE N/A 06/25/2018   Procedure: REMOVAL OF THREE INTERBRONCHIAL VALVE (IBV);  Surgeon: Loreli Slot, MD;  Location: [facility] OR;  Service: Thoracic;  Laterality: N/A;  . LOBECTOMY  12/17/2011   Procedure: LOBECTOMY;  Surgeon: Loreli Slot, MD;  Location: [facility] OR;  Service: Thoracic;  Laterality: Left;  (L)VATS, WEDGE RESECTION, LEFT UPPER LOBECTOMY,  Multiple node biopsies  . REMOVAL OF PLEURAL DRAINAGE CATHETER Left 05/06/2018   Procedure: REMOVAL OF CHEST TUBE;  Surgeon: Loreli Slot, MD;  Location: [facility] OR;  Service: Thoracic;  Laterality: Left;  . THYROID SURGERY  1980   1/2 thyroid removed - benign  . URETHRA SURGERY    . VIDEO ASSISTED THORACOSCOPY Left 04/08/2018   Procedure: REDO LEFT VIDEO ASSISTED THORACOSCOPY with left lower lobe wedge  resection;  Surgeon: Loreli Slot, MD;  Location: [facility] OR;  Service: Thoracic;  Laterality: Left;  Marland Kitchen VIDEO BRONCHOSCOPY N/A 04/16/2018   Procedure: VIDEO BRONCHOSCOPY;  Surgeon: Loreli Slot, MD;  Location: [facility] OR;  Service: Thoracic;  Laterality: N/A;  . VIDEO BRONCHOSCOPY N/A 06/25/2018   Procedure: VIDEO BRONCHOSCOPY;  Surgeon: Loreli Slot, MD;  Location: [facility] OR;  Service: Thoracic;  Laterality: N/A;    SOCIAL HISTORY: Social History   Socioeconomic History  . Marital status: Married    Spouse name: Not on file  . Number of children: 1  . Years of education: Not on file  . Highest education level: Not on file  Occupational History  . Occupation: computer data entry   Tobacco Use  . Smoking status: Former Smoker    Packs/day: 0.50    Years: 40.00    Pack years: 20.00    Types: Cigarettes    Quit date: 12/16/2011    Years since quitting: 8.9  . Smokeless tobacco: Never Used  Vaping Use  . Vaping Use: Never used  Substance and Sexual Activity  . Alcohol use: Yes    Alcohol/week: 14.0 standard drinks    Types: 7 Cans of beer, 7 Glasses of wine per week    Comment: 1 per day   . Drug use: No  . Sexual activity: Not on file  Other Topics Concern  . Not on file  Social History Narrative  . Not on file   Social Determinants of Health   Financial Resource Strain: Not on file  Food Insecurity: Not on file  Transportation Needs: Not on file  Physical Activity: Not on file  Stress: Not on file  Social Connections: Not on file  Intimate Partner Violence: Not on file    FAMILY HISTORY: Family History  Problem Relation Age of Onset  . Cancer Father 51       metastatic cancer   . Cancer Brother 60       brain tumor   . Cancer Cousin        colon cancer  . Cancer Cousin        lung cancer    ALLERGIES:  has No Known Allergies.  MEDICATIONS:  Current Outpatient Medications  Medication Sig Dispense Refill  . acetaminophen (TYLENOL) 500 MG  tablet Take 1,000 mg by mouth every 6 (six) hours as needed for moderate pain or headache.    Marland Kitchen acyclovir ointment (ZOVIRAX) 5 % Apply 1 application topically every 3 (three) hours as needed. 15 g 0  . albuterol (VENTOLIN HFA) 108 (90 Base) MCG/ACT inhaler Inhale 2 puffs into the lungs every 4 (four) hours as  needed for wheezing or shortness of breath. For shortness of breath 8.51 g 5  . alendronate (FOSAMAX) 70 MG tablet Take 70 mg by mouth every Friday.     Marland Kitchen amLODipine (NORVASC) 10 MG tablet Take 10 mg by mouth daily.      Marland Kitchen BEVESPI AEROSPHERE 9-4.8 MCG/ACT AERO INHALE 2 PUFFS INTO THE LUNGS 2 TIMES DAILY. 10.7 g 11  . colestipol (COLESTID) 1 g tablet Take 1 g by mouth daily as needed (for diarrhea).     . fluticasone (FLONASE) 50 MCG/ACT nasal spray Place 2 sprays into both nostrils daily as needed for allergies. 16 g 2  . montelukast (SINGULAIR) 10 MG tablet Take 1 tablet (10 mg total) by mouth at bedtime. 30 tablet 5  . sertraline (ZOLOFT) 50 MG tablet Take 50 mg by mouth at bedtime.     No current facility-administered medications for this visit.    PHYSICAL EXAMINATION: ECOG PERFORMANCE STATUS: 0 - Asymptomatic  Vitals:   11/08/20 1450  BP: 136/76  Pulse: (!) 106  Resp: 19  Temp: 99.6 F (37.6 C)  SpO2: 96%   Filed Weights   11/08/20 1450  Weight: 123 lb 3.2 oz (55.9 kg)    GENERAL:alert, no distress and comfortable SKIN: skin color, texture, turgor are normal, no rashes or significant lesions EYES: normal, Conjunctiva are pink and non-injected, sclera clear  NECK: supple, thyroid normal size, non-tender, without nodularity LYMPH:  no palpable lymphadenopathy in the cervical, axillary  LUNGS: clear to auscultation and percussion with normal breathing effort HEART: regular rate & rhythm and no murmurs and no lower extremity edema ABDOMEN:abdomen soft, non-tender and normal bowel sounds Musculoskeletal:no cyanosis of digits and no clubbing  NEURO: alert & oriented x 3  with fluent speech, no focal motor/sensory deficits BREAST: (+) Skin ecchymosis of left breast at biopsy site (+) palpable 2x2.5cm of left breast after biopsy. Right Breast exam benign.  LABORATORY DATA:  I have reviewed the data as listed CBC Latest Ref Rng & Units 06/14/2019 11/30/2018 06/17/2018  WBC 4.0 - 10.5 K/uL 7.0 9.1 8.6  Hemoglobin 12.0 - 15.0 g/dL 15.8(H) 15.0 13.7  Hematocrit 36.0 - 46.0 % 46.9(H) 45.9 42.5  Platelets 150 - 400 K/uL 290 312 407(H)    CMP Latest Ref Rng & Units 06/14/2019 11/30/2018 06/17/2018  Glucose 70 - 99 mg/dL 85 94 90  BUN 8 - 23 mg/dL $Remove'10 19 19  'ASVcjlX$ Creatinine 0.44 - 1.00 mg/dL 0.69 1.02(H) 0.60  Sodium 135 - 145 mmol/L 137 139 136  Potassium 3.5 - 5.1 mmol/L 3.5 4.5 3.6  Chloride 98 - 111 mmol/L 102 106 105  CO2 22 - 32 mmol/L $RemoveB'23 23 23  'fEwuaegr$ Calcium 8.9 - 10.3 mg/dL 8.6(L) 8.8(L) 9.0  Total Protein 6.5 - 8.1 g/dL 7.0 7.2 6.6  Total Bilirubin 0.3 - 1.2 mg/dL 0.6 0.3 0.4  Alkaline Phos 38 - 126 U/L 122 130(H) 108  AST 15 - 41 U/L $Remo'18 15 20  'AYhjB$ ALT 0 - 44 U/L $Remo'14 15 14     'TZJEq$ RADIOGRAPHIC STUDIES: I have personally reviewed the radiological images as listed and agreed with the findings in the report. No results found.  ASSESSMENT & PLAN:  TAELYR JANTZ is a 70 y.o. Caucasian female with a history of Stage IA non-small cell lung Cancer, COPD, HTN   1.  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 She palpated her breast mass herself in mid January. Her mammogram showed a 1.2cm mass in the 11:00 position of her left breast. Biopsy showed invasive ductal carcinoma, triple negative.  -Given the early stage disease, she is likely a candidate for lumpectomy with sentinel LN biopsy.  Due to the aggressive nature of triple negative disease, and her overall good health, I do feel SLB is beneficial.  she is agreeable  and will proceed with surgery on 11/21/20 with Dr Brantley Stage. -The risk of recurrence depends on the stage and biology of the tumor. She is early stage, with ER/PR/HER2 negative markers. I discussed this is the more aggressive type of breast cancer  with high risk of recurrence.  -To reduce her risk of recurrence, I recommend adjuvant chemotherapy after surgery. Depends on her final surgical path result, I will determine her chemo regimen. If node negative and tumor less than 2cm, I recommend moderate intensive TC q3weeks for 4 cycles  --Chemotherapy consent: Side effects including but does not limited to, fatigue, nausea, vomiting, diarrhea, hair loss, neuropathy, fluid retention, renal and kidney dysfunction, neutropenic fever, needed for blood transfusion, bleeding, were discussed with patient in great detail. She agrees to proceed with chemo.  -I discussed PAC placement if indicated. She will have chemo education class before start of treatment.  -To reduce her risk of local recurrence, Adjuvant radiation is recommended after lumpectomy to reduce her risk of local recurrence. I will refer her to Dr Isidore Moos after chemotherapy.  -Given the negative ER and PR expression, she does not benefit from antiestrogen therapy, which I do not recommend.  -Proceed with surgery soon. I will f/u with her in 4 weeks to further discuss adjuvant chemotherapy.    2. RUL stage IA, T1 a, N0, M0) non-small cell lung cancer, adenocarcinoma Dx in 2013 + LLL stage Ib (T2, and 0, M0) non-small cell lung cancer, adenocarcinoma Dx 02/2018. -She was treated with right upper lobectomy in April 2013 initially.  -She was also treated with left lower lobe superior segmentectomy under the care of Dr. Roxan Hockey on April 08, 2018. -She has been followed by Med Onc Dr Earlie Server, under observation.    3. COPD, HTN  -She quit smoking in 2013 after she was diagnosed with lung cancer.  -She has SOB and uses inhaler daily. Not on oxygen.   -continue to f/u with Pulmonologist Dr Lamonte Sakai.    4. Spinal scoliosis, Left scapular pain  -She notes for the past 3 weeks having intermittent left scapular pain, last episode 3 days ago.  -She has scoliosis, which I suspect may effect this pain.    PLAN:  -Proceed with surgery on 11/21/20, with port placement during her surgery  -Lab and f/u in 4 weeks to finalize her adjuvant chemo. I encouraged her to bring her husband or son.  -read/onc referral    Orders Placed This Encounter  Procedures  . Ambulatory referral to Radiation Oncology    Referral Priority:   Routine    Referral Type:   Consultation    Referral Reason:   Specialty Services Required    Requested Specialty:   Radiation Oncology    Number of Visits Requested:   1    All questions were answered. The patient knows to call the clinic with any problems, questions or concerns. The total time spent in the appointment was 60 minutes.     Truitt Merle, MD 11/08/2020 9:51 PM  I, Joslyn Devon, am acting as scribe for Truitt Merle,  MD.   I have reviewed the above documentation for accuracy and completeness, and I agree with the above.

## 2020-11-08 ENCOUNTER — Ambulatory Visit: Payer: Medicare Other | Admitting: Nurse Practitioner

## 2020-11-08 ENCOUNTER — Inpatient Hospital Stay: Payer: Medicare Other | Attending: Nurse Practitioner | Admitting: Hematology

## 2020-11-08 ENCOUNTER — Other Ambulatory Visit: Payer: Self-pay

## 2020-11-08 ENCOUNTER — Encounter: Payer: Self-pay | Admitting: Hematology

## 2020-11-08 DIAGNOSIS — Z87891 Personal history of nicotine dependence: Secondary | ICD-10-CM | POA: Insufficient documentation

## 2020-11-08 DIAGNOSIS — Z171 Estrogen receptor negative status [ER-]: Secondary | ICD-10-CM | POA: Insufficient documentation

## 2020-11-08 DIAGNOSIS — Z85118 Personal history of other malignant neoplasm of bronchus and lung: Secondary | ICD-10-CM | POA: Insufficient documentation

## 2020-11-08 DIAGNOSIS — C50212 Malignant neoplasm of upper-inner quadrant of left female breast: Secondary | ICD-10-CM

## 2020-11-08 DIAGNOSIS — I10 Essential (primary) hypertension: Secondary | ICD-10-CM | POA: Diagnosis not present

## 2020-11-09 ENCOUNTER — Telehealth: Payer: Self-pay | Admitting: Emergency Medicine

## 2020-11-09 ENCOUNTER — Ambulatory Visit: Payer: Self-pay | Admitting: Surgery

## 2020-11-09 ENCOUNTER — Telehealth: Payer: Self-pay | Admitting: *Deleted

## 2020-11-09 ENCOUNTER — Encounter: Payer: Self-pay | Admitting: *Deleted

## 2020-11-09 ENCOUNTER — Telehealth: Payer: Self-pay | Admitting: Hematology

## 2020-11-09 MED ORDER — DOXYCYCLINE HYCLATE 100 MG PO TABS: 100.0000 mg | ORAL_TABLET | Freq: Two times a day (BID) | ORAL | 0 refills | Status: DC

## 2020-11-09 NOTE — Telephone Encounter (Signed)
[facility] scheduled pt's appt from the order.  Called pt & gave her GI's phone number.  Pt verbalized understanding & nothing further needed at this time.

## 2020-11-09 NOTE — Telephone Encounter (Signed)
Spoke to pt regarding navigation resources and provided contact information. Denies questions or concerns regarding dx or treatment care plan. Encourage pt to call with needs. Received verbal understanding.

## 2020-11-09 NOTE — Telephone Encounter (Signed)
Sorry to hear that she has not feeling well.  Since like a COPD exacerbation No known drug allergies  Continue on her current COPD maintenance regimen  May begin doxycycline 100 mg twice daily for 7 days, take with food Mucinex DM twice daily as needed for cough or congestion  Make sure she keeps her follow-up visit with Dr. Lamonte Sakai  Needs COVID-19 testing  Please contact office for sooner follow up if symptoms do not improve or worsen or seek emergency care

## 2020-11-09 NOTE — Telephone Encounter (Signed)
Scheduled follow-up appointment per 2/23 los. Patient is aware.

## 2020-11-09 NOTE — Telephone Encounter (Signed)
Called and spoke with pt letting her know the recs stated by TP and stated to her that we were going to send Rx for doxy to pharmacy for her and she verbalized understanding. Rx has been sent to pharmacy for pt.  Also stated to her that we needed to get her scheduled for f/u with RB after CT. Stated to pt that her current CT is scheduled 3/21 and pt stated that she is going to need that to be rescheduled until April 2022.  I did go ahead and schedule pt an OV with RB 01/08/21. Stated to pt that I would send message to [facility] so they can gt the CT rescheduled to be done in April prior to her OV and she verbalized understanding. Routing to PCCs.

## 2020-11-09 NOTE — Telephone Encounter (Signed)
Called and spoke with pt and she stated that she started yesterday with nasal congestion and headache with yellow sputum.  She stated that this is how it always starts and then goes to her chest and she ends up with bronchitis or PNA.  She is trying to prevent this.  She stated that she has no other symptoms, and has not been around anyone else.  She stays at home and does not go out.  She is requesting that something be sent in to the pharmacy to prevent this from getting worse.  She stated that [facility] does not work for her.  RB out of the office.  TP please advise. Thanks  No Known Allergies

## 2020-11-13 LAB — SURGICAL PATHOLOGY

## 2020-11-14 DIAGNOSIS — I10 Essential (primary) hypertension: Secondary | ICD-10-CM | POA: Diagnosis not present

## 2020-11-14 DIAGNOSIS — E785 Hyperlipidemia, unspecified: Secondary | ICD-10-CM | POA: Diagnosis not present

## 2020-11-15 NOTE — Progress Notes (Signed)
Surgical Instructions    Your procedure is scheduled on 11/21/20.  Report to [facility] Main Entrance "A" at 09:00 A.M., then check in with the Admitting office.  Call this number if you have problems the morning of surgery:  [phone]   If you have any questions prior to your surgery date call [phone]: Open Monday-Friday 8am-4pm    Remember:  Do not eat after midnight the night before your surgery  You may drink clear liquids until 08:00am the morning of your surgery.   Clear liquids allowed are: Water, Non-Citrus Juices (without pulp), Carbonated Beverages, Clear Tea, Black Coffee Only, and Gatorade    Take these medicines the morning of surgery with A SIP OF WATER  acetaminophen (TYLENOL) alendronate (FOSAMAX) albuterol (VENTOLIN HFA) inhaler: bring inhaler with you the day of surgery.  amLODipine (NORVASC)  BEVESPI AEROSPHERE inhaler colestipol (COLESTID) doxycycline (VIBRA-TABS)    As of today, STOP taking any Aspirin (unless otherwise instructed by your surgeon) Aleve, Naproxen, Ibuprofen, Motrin, Advil, Goody's, BC's, all herbal medications, fish oil, and all vitamins.                     Do not wear jewelry, make up, or nail polish            Do not wear lotions, powders, perfumes/colognes, or deodorant.            Do not shave 48 hours prior to surgery.              Do not bring valuables to the hospital.            [facility] is not responsible for any belongings or valuables.  Do NOT Smoke (Tobacco/Vaping) or drink Alcohol 24 hours prior to your procedure If you use a CPAP at night, you may bring all equipment for your overnight stay.   Contacts, glasses, dentures or bridgework may not be worn into surgery, please bring cases for these belongings   For patients admitted to the hospital, discharge time will be determined by your treatment team.   Patients discharged the day of surgery will not be allowed to drive home, and someone needs to stay with them for  24 hours.    Special instructions:   [place]- Preparing For Surgery  Before surgery, you can play an important role. Because skin is not sterile, your skin needs to be as free of germs as possible. You can reduce the number of germs on your skin by washing with CHG (chlorahexidine gluconate) Soap before surgery.  CHG is an antiseptic cleaner which kills germs and bonds with the skin to continue killing germs even after washing.    Oral Hygiene is also important to reduce your risk of infection.  Remember - BRUSH YOUR TEETH THE MORNING OF SURGERY WITH YOUR REGULAR TOOTHPASTE  Please do not use if you have an allergy to CHG or antibacterial soaps. If your skin becomes reddened/irritated stop using the CHG.  Do not shave (including legs and underarms) for at least 48 hours prior to first CHG shower. It is OK to shave your face.  Please follow these instructions carefully.   1. Shower the NIGHT BEFORE SURGERY and the MORNING OF SURGERY  2. If you chose to wash your hair, wash your hair first as usual with your normal shampoo.  3. After you shampoo, rinse your hair and body thoroughly to remove the shampoo.  4. Wash Face and genitals (private parts) with your normal  soap.   5.  Shower the NIGHT BEFORE SURGERY and the MORNING OF SURGERY with CHG Soap.   6. Use CHG Soap as you would any other liquid soap. You can apply CHG directly to the skin and wash gently with a scrungie or a clean washcloth.   7. Apply the CHG Soap to your body ONLY FROM THE NECK DOWN.  Do not use on open wounds or open sores. Avoid contact with your eyes, ears, mouth and genitals (private parts). Wash Face and genitals (private parts)  with your normal soap.   8. Wash thoroughly, paying special attention to the area where your surgery will be performed.  9. Thoroughly rinse your body with warm water from the neck down.  10. DO NOT shower/wash with your normal soap after using and rinsing off the CHG  Soap.  11. Pat yourself dry with a CLEAN TOWEL.  12. Wear CLEAN PAJAMAS to bed the night before surgery  13. Place CLEAN SHEETS on your bed the night before your surgery  14. DO NOT SLEEP WITH PETS.   Day of Surgery: Take a shower.  Wear Clean/Comfortable clothing the morning of surgery Do not apply any deodorants/lotions.   Remember to brush your teeth WITH YOUR REGULAR TOOTHPASTE.   Please read over the following fact sheets that you were given.

## 2020-11-16 ENCOUNTER — Other Ambulatory Visit: Payer: Self-pay

## 2020-11-16 ENCOUNTER — Encounter (HOSPITAL_COMMUNITY): Payer: Self-pay

## 2020-11-16 ENCOUNTER — Encounter (HOSPITAL_COMMUNITY)
Admission: RE | Admit: 2020-11-16 | Discharge: 2020-11-16 | Disposition: A | Discharge status: Home / Self Care | Payer: Medicare Other | Source: Ambulatory Visit | Attending: Surgery | Admitting: Surgery

## 2020-11-16 ENCOUNTER — Encounter: Payer: Self-pay | Admitting: *Deleted

## 2020-11-16 ENCOUNTER — Other Ambulatory Visit (HOSPITAL_COMMUNITY): Payer: Medicare Other

## 2020-11-16 DIAGNOSIS — C50912 Malignant neoplasm of unspecified site of left female breast: Secondary | ICD-10-CM | POA: Diagnosis not present

## 2020-11-16 DIAGNOSIS — K580 Irritable bowel syndrome with diarrhea: Secondary | ICD-10-CM | POA: Diagnosis not present

## 2020-11-16 DIAGNOSIS — I4510 Unspecified right bundle-branch block: Secondary | ICD-10-CM | POA: Insufficient documentation

## 2020-11-16 DIAGNOSIS — I10 Essential (primary) hypertension: Secondary | ICD-10-CM | POA: Diagnosis not present

## 2020-11-16 DIAGNOSIS — Z01818 Encounter for other preprocedural examination: Secondary | ICD-10-CM | POA: Insufficient documentation

## 2020-11-16 DIAGNOSIS — Z0000 Encounter for general adult medical examination without abnormal findings: Secondary | ICD-10-CM | POA: Diagnosis not present

## 2020-11-16 DIAGNOSIS — F325 Major depressive disorder, single episode, in full remission: Secondary | ICD-10-CM | POA: Diagnosis not present

## 2020-11-16 DIAGNOSIS — E785 Hyperlipidemia, unspecified: Secondary | ICD-10-CM | POA: Diagnosis not present

## 2020-11-16 DIAGNOSIS — J449 Chronic obstructive pulmonary disease, unspecified: Secondary | ICD-10-CM | POA: Diagnosis not present

## 2020-11-16 DIAGNOSIS — Z1389 Encounter for screening for other disorder: Secondary | ICD-10-CM | POA: Diagnosis not present

## 2020-11-16 DIAGNOSIS — M810 Age-related osteoporosis without current pathological fracture: Secondary | ICD-10-CM | POA: Diagnosis not present

## 2020-11-16 HISTORY — DX: Pneumonia, unspecified organism: J18.9

## 2020-11-16 HISTORY — DX: Malignant neoplasm of unspecified site of unspecified female breast: C50.919

## 2020-11-16 NOTE — Progress Notes (Signed)
PCP - Dr. [name] ([place] [facility] ) Cardiologist - denies Pulmonologist - Dr. Baltazar Apo  PPM/ICD - denies  Chest x-ray - N/A EKG - 11/16/2020 Stress Test - 2005 ECHO - 2012 Cardiac Cath - denies   Sleep Study - denies CPAP - N/A  DM: denies  Blood Thinner Instructions: N/A Aspirin Instructions: N/A  ERAS Protcol - Yes PRE-SURGERY Ensure or G2- Ensure given  COVID TEST- Scheduled for 11/20/2020. Patient verbalized understanding of self-quarantine instructions, appointment time and place.  Anesthesia review: YES, seed placement scheduled for 11/17/2020, records requested from PCP Patient had labs drawn on 11/14/2020 at PCP's office, results in [place]. Patient stated has an appointment today with PCP, last office note requested  Patient denies shortness of breath, fever, cough and chest pain at PAT appointment   All instructions explained to the patient, with a verbal understanding of the material. Patient agrees to go over the instructions while at home for a better understanding. Patient also instructed to self quarantine after being tested for COVID-19. The opportunity to ask questions was provided.

## 2020-11-17 ENCOUNTER — Encounter (HOSPITAL_COMMUNITY): Payer: Self-pay

## 2020-11-17 ENCOUNTER — Ambulatory Visit
Admission: RE | Admit: 2020-11-17 | Discharge: 2020-11-17 | Disposition: A | Discharge status: Home / Self Care | Payer: Medicare Other | Source: Ambulatory Visit | Attending: Surgery | Admitting: Surgery

## 2020-11-17 ENCOUNTER — Other Ambulatory Visit (HOSPITAL_COMMUNITY)
Admission: RE | Admit: 2020-11-17 | Discharge: 2020-11-17 | Disposition: A | Discharge status: Home / Self Care | Payer: Medicare Other | Source: Ambulatory Visit | Attending: Surgery | Admitting: Surgery

## 2020-11-17 ENCOUNTER — Inpatient Hospital Stay (HOSPITAL_COMMUNITY): Admission: RE | Admit: 2020-11-17 | Payer: Medicare Other | Source: Ambulatory Visit

## 2020-11-17 ENCOUNTER — Other Ambulatory Visit: Payer: Self-pay | Admitting: Surgery

## 2020-11-17 DIAGNOSIS — Z01812 Encounter for preprocedural laboratory examination: Secondary | ICD-10-CM | POA: Diagnosis not present

## 2020-11-17 DIAGNOSIS — Z20822 Contact with and (suspected) exposure to covid-19: Secondary | ICD-10-CM | POA: Diagnosis not present

## 2020-11-17 DIAGNOSIS — C50212 Malignant neoplasm of upper-inner quadrant of left female breast: Secondary | ICD-10-CM

## 2020-11-17 LAB — SARS CORONAVIRUS 2 (TAT 6-24 HRS): SARS Coronavirus 2: NEGATIVE

## 2020-11-17 NOTE — Anesthesia Preprocedure Evaluation (Addendum)
Anesthesia Evaluation  Patient identified by MRN, date of birth, ID band Patient awake    Reviewed: Allergy & Precautions, NPO status , Patient's Chart, lab work & pertinent test results  History of Anesthesia Complications (+) PONV and history of anesthetic complications  Airway Mallampati: II  TM Distance: >3 FB Neck ROM: Full    Dental no notable dental hx.    Pulmonary COPD,  COPD inhaler, former smoker,    Pulmonary exam normal        Cardiovascular hypertension, Pt. on medications Normal cardiovascular exam  TTE 2012 unremarkable   Neuro/Psych negative neurological ROS  negative psych ROS   GI/Hepatic negative GI ROS, Neg liver ROS,   Endo/Other  negative endocrine ROS  Renal/GU negative Renal ROS  negative genitourinary   Musculoskeletal negative musculoskeletal ROS (+)   Abdominal   Peds  Hematology negative hematology ROS (+)   Anesthesia Other Findings Day of surgery medications reviewed with patient.  Reproductive/Obstetrics negative OB ROS                           Anesthesia Physical Anesthesia Plan  ASA: II  Anesthesia Plan: General   Post-op Pain Management: GA combined w/ Regional for post-op pain   Induction: Intravenous  PONV Risk Score and Plan: 4 or greater and Treatment may vary due to age or medical condition, Ondansetron, Dexamethasone, Midazolam and Propofol infusion  Airway Management Planned: LMA  Additional Equipment: None  Intra-op Plan:   Post-operative Plan: Extubation in OR  Informed Consent: I have reviewed the patients History and Physical, chart, labs and discussed the procedure including the risks, benefits and alternatives for the proposed anesthesia with the patient or authorized representative who has indicated his/her understanding and acceptance.     Dental advisory given  Plan Discussed with: CRNA  Anesthesia Plan Comments: (PAT  note written 11/17/2020 by Myra Gianotti, PA-C. She had labs at [facility] on 11/14/20 which can be reviewed within note found in [place] or in PAT note. )      Anesthesia Quick Evaluation

## 2020-11-17 NOTE — Progress Notes (Addendum)
Anesthesia Chart Review:  Case: [id number] Date/Time: 11/21/20 1045   Procedures:      LEFT BREAST LUMPECTOMY WITH RADIOACTIVE SEED AND LEFT SENTINEL LYMPH NODE [place] (Left Breast) - PEC BLOCK; START TIME OF 11:00 AM FOR 90 MINUTES IN ROOM 2     INSERTION PORT-A-CATH (Left )   Anesthesia type: General   Pre-op diagnosis: LEFT BREAST CANCER   Location: [place] [facility] / [place] OR   Surgeons: Erroll Luna, MD     BCG 3/422 at 11:00 AM NUC MED 11/21/20 at 10:30 AM   DISCUSSION: Patient is a 70 year old female scheduled for the above procedure.  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  Last saw pulmonologist Dr. Lamonte Sakai in 07/2020. She notified pulmonology on 11/09/20 of some nasal congestion, yellow sputum, headache. No other symptoms, but requested treatment in hopes of avoiding developing bronchitis. She was prescribed  Doxycycline which was completed 11/16/20. She had primary care routine follow-up with [name], MD on 11/16/20 with documented "pt is breathing well at present..."  She does not use home O2. Her CBC and CMET done at [facility] on 11/14/20 were unremarkable. Denied chest pain, SOB, cough, fever per PAT RN documentation.   RSL is scheduled for 11/17/20 at 11:00 AM. Received chart for review minutes before that appointment but was able to notify Manuela Schwartz at the [facility] that patient's COVID-19 test was not scheduled until 11/20/20. She will confirm that procedure staff there are aware. I also notified [facility] triage nurse. Last Pfizer COVID-19 vaccine noted is from 07/01/20. (UPDATE:  Appears COVID-19 test is now rescheduled for 11/17/20 at 1:55 PM with RSL rescheduled for 11/21/20 at 7:30 AM.)    Anesthesia team to evaluate on the day of surgery.   VS: BP 108/74   Pulse 99   Temp 36.7 C (Oral)   Resp 19   Ht 5\' 4"  (1.626 m)   Wt 55 kg   SpO2 95%   BMI 20.82 kg/m    PROVIDERS: [name], MD is PCP [facility]) - Baltazar Apo, MD is pulmonologist. Last visit 08/08/20. Repeat chest CT is planned ~ 11/2020. Modesto Charon, MD is CT surgeon. Last visit 12/08/18. One year follow-up planned. Truitt Merle, MD is HEM-ONC for breast cancer. Last visit 11/08/20.  Curt Bears, MD is HEM-ONC for lung cancer. Last visit 06/16/19. Continued observation with serial chest CT scans recommended. (Last CT ordered by Dr. Lamonte Sakai, done on 08/01/20.)   LABS: 11/14/20 labs found in [place] (11/14/20 Summarization Note) include: CBC with Diff  2020-11-14   WBC 10.1  4.0-11.0  RBC 4.86  4.20-5.40  HGB 14.8  12.0-16.0  HCT 44.5  37.0-47.0  MCV 91.4  81.0-99.0  MCH 30.4  27.0-33.0  MCHC 33.2  32.0-36.0  RDW 13.1  11.5-15.5  PLT 430  150-400  MPV 7.4  7.5-10.7  NE% 72.6  43.3-71.9  LY% 18.5  16.8-43.5  MO% 7.4  4.6-12.4  EO% 0.6  0.0-7.8  BA% 0.9  0.0-1.0  NE# 7.3  1.9-7.2  LY# 1.90  1.10-2.70  MO# 0.7  0.3-0.8  EO# 0.1  0.0-0.6  BA# 0.1  0.0-0.1  NRBC%  0.40    NRBC# 7.25    Comp Metabolic Panel  [id number]   Glucose 94  70-99  BUN 15  6-26  Creatinine 0.55  0.60-1.30  eGFR 109  >60  AAeGFR 132  >60  Sodium 137  136-145  Potassium 5.6  3.5-5.5  Chloride 98  98-107  CO2 28  22-32  Anion Gap 16.6  6.0-20.0  Calcium 9.7  8.6-10.3  CA-corrected 9.65  8.60-10.30  Protein, Total 7.4  6.0-8.3  Albumin 4.0  3.4-4.8  TBIL 0.5  0.3-1.0  ALP 147  38-126  AST 31  0-39  ALT 12  0-52  TSH  2020-11-14   TSH 1.08  0.34-4.50    Most recent PFT 04/06/2018 (prior to LLL lung wedge resection) showed  FEV1 1.14 L (49% predicted) with a positive bronchodilator response.   IMAGES: CT Chest 08/01/20: IMPRESSION: Stable postop changes from prior left upper lobectomy. Stable small bilateral pulmonary nodules. No new or progressive disease within the thorax. Aortic Atherosclerosis (ICD10-I70.0) and Emphysema (ICD10-J43.9).   EKG: 11/16/20: Normal sinus rhythm Possible Left atrial enlargement Incomplete right bundle branch block Borderline ECG No significant change since last tracing Confirmed by Vernell Leep ([id number]) on 11/16/2020 8:54:33 PM   CV: Echo 07/30/11: Study Conclusions  - Left ventricle: The cavity size was normal. There was mild  concentric hypertrophy. Systolic function was normal. The  estimated ejection fraction was in the range of 60% to  65%. Although no diagnostic regional wall motion  abnormality was identified, this possibility cannot be  completely excluded on the basis of this study. Doppler  parameters are consistent with abnormal left ventricular  relaxation (grade 1 diastolic dysfunction).  - Right atrium: The atrium was mildly dilated.   Remote history of normal stress cardiolite on 02/24/04.   Past Medical History:  Diagnosis Date  . Cancer ([place])    Stage IA non-small cell lung cancer, left upper lobectomy 12/2011  . COPD (chronic obstructive pulmonary disease) ([place])   . Cough 07/29/11   started coughing up blood this am  . Cough   . Hypertension   . Kyphoscoliosis   . Pneumonia   . PONV (postoperative nausea and vomiting)   . Recurrent upper respiratory infection (URI)     Past Surgical History:  Procedure Laterality Date  . BRONCHOSCOPY    . CATARACT EXTRACTION, BILATERAL  2016  . EYE SURGERY    . INSERTION OF IBV VALVE Left 04/16/2018   Procedure: INSERTION OF INTERBRONCHIAL VALVE (IBV);  Surgeon: [name], MD;  Location: [facility];  Service: Thoracic;  Laterality: Left;  . INSERTION OF IBV VALVE N/A 06/25/2018    Procedure: REMOVAL OF THREE INTERBRONCHIAL VALVE (IBV);  Surgeon: [name], MD;  Location: [place];  Service: Thoracic;  Laterality: N/A;  . LOBECTOMY  12/17/2011   Procedure: LOBECTOMY;  Surgeon: [name], MD;  Location: [facility];  Service: Thoracic;  Laterality: Left;  (L)VATS, WEDGE RESECTION, LEFT UPPER LOBECTOMY,  Multiple node biopsies  . REMOVAL OF PLEURAL DRAINAGE CATHETER Left 05/06/2018   Procedure: REMOVAL OF CHEST TUBE;  Surgeon: [name], MD;  Location: [facility];  Service: Thoracic;  Laterality: Left;  . THYROID SURGERY  1980   1/2 thyroid removed - benign  . URETHRA SURGERY    . VIDEO ASSISTED THORACOSCOPY Left 04/08/2018   Procedure: REDO LEFT VIDEO ASSISTED THORACOSCOPY with left lower lobe wedge resection;  Surgeon: [name], MD;  Location: [facility];  Service: Thoracic;  Laterality: Left;  Mar[place] Kitchen VIDEO BRONCHOSCOPY N/A 04/16/2018   Procedure: VIDEO BRONCHOSCOPY;  Surgeon: [name], MD;  Location: [place];  Service: Thoracic;  Laterality: N/A;  . VIDEO BRONCHOSCOPY N/A 06/25/2018   Procedure: VIDEO BRONCHOSCOPY;  Surgeon: [name], MD;  Location: [facility];  Service: Thoracic;  Laterality: N/A;    MEDICATIONS: . acetaminophen (TYLENOL) 500 MG tablet  . acyclovir ointment (ZOVIRAX) 5 %  . albuterol (VENTOLIN HFA) 108 (90 Base) MCG/ACT inhaler  . alendronate (FOSAMAX) 70 MG tablet  . amLODipine (NORVASC) 10 MG tablet  . BEVESPI AEROSPHERE 9-4.8 MCG/ACT AERO  . colestipol (COLESTID) 1 g tablet  . doxycycline (VIBRA-TABS) 100 MG tablet  . fluticasone (FLONASE) 50 MCG/ACT nasal spray  . montelukast (SINGULAIR) 10 MG tablet  . sertraline (ZOLOFT) 50 MG tablet   No current facility-administered medications for this encounter.    Myra Gianotti, PA-C Surgical Short Stay/Anesthesiology [place] Phone [phone] [facility] Phone [phone] 11/17/2020 12:10 PM

## 2020-11-20 ENCOUNTER — Encounter (HOSPITAL_COMMUNITY): Payer: Self-pay | Admitting: Surgery

## 2020-11-20 ENCOUNTER — Other Ambulatory Visit (HOSPITAL_COMMUNITY): Admission: RE | Admit: 2020-11-20 | Payer: Medicare Other | Source: Ambulatory Visit

## 2020-11-21 ENCOUNTER — Other Ambulatory Visit: Payer: Self-pay

## 2020-11-21 ENCOUNTER — Encounter (HOSPITAL_COMMUNITY)
Admission: RE | Disposition: A | Discharge status: Home / Self Care | Payer: Self-pay | Source: Home / Self Care | Attending: Surgery

## 2020-11-21 ENCOUNTER — Encounter (HOSPITAL_COMMUNITY): Payer: Self-pay | Admitting: Surgery

## 2020-11-21 ENCOUNTER — Ambulatory Visit
Admission: RE | Admit: 2020-11-21 | Discharge: 2020-11-21 | Disposition: A | Discharge status: Home / Self Care | Payer: Medicare Other | Source: Ambulatory Visit | Attending: Surgery | Admitting: Surgery

## 2020-11-21 ENCOUNTER — Other Ambulatory Visit: Payer: Medicare Other

## 2020-11-21 ENCOUNTER — Ambulatory Visit (HOSPITAL_COMMUNITY): Payer: Medicare Other

## 2020-11-21 ENCOUNTER — Other Ambulatory Visit: Payer: Self-pay | Admitting: Surgery

## 2020-11-21 ENCOUNTER — Ambulatory Visit (HOSPITAL_COMMUNITY): Payer: Medicare Other | Admitting: Anesthesiology

## 2020-11-21 ENCOUNTER — Encounter (HOSPITAL_COMMUNITY)
Admission: RE | Admit: 2020-11-21 | Discharge: 2020-11-21 | Disposition: A | Discharge status: Home / Self Care | Payer: Medicare Other | Source: Ambulatory Visit | Attending: Surgery | Admitting: Surgery

## 2020-11-21 ENCOUNTER — Ambulatory Visit (HOSPITAL_COMMUNITY)
Admission: RE | Admit: 2020-11-21 | Discharge: 2020-11-21 | Disposition: A | Discharge status: Home / Self Care | Payer: Medicare Other | Attending: Surgery | Admitting: Surgery

## 2020-11-21 ENCOUNTER — Ambulatory Visit (HOSPITAL_COMMUNITY): Payer: Medicare Other | Admitting: Physician Assistant

## 2020-11-21 DIAGNOSIS — C50412 Malignant neoplasm of upper-outer quadrant of left female breast: Secondary | ICD-10-CM | POA: Diagnosis not present

## 2020-11-21 DIAGNOSIS — I517 Cardiomegaly: Secondary | ICD-10-CM | POA: Diagnosis not present

## 2020-11-21 DIAGNOSIS — C50212 Malignant neoplasm of upper-inner quadrant of left female breast: Secondary | ICD-10-CM

## 2020-11-21 DIAGNOSIS — Z95828 Presence of other vascular implants and grafts: Secondary | ICD-10-CM

## 2020-11-21 DIAGNOSIS — I10 Essential (primary) hypertension: Secondary | ICD-10-CM | POA: Diagnosis not present

## 2020-11-21 DIAGNOSIS — C50912 Malignant neoplasm of unspecified site of left female breast: Secondary | ICD-10-CM | POA: Diagnosis not present

## 2020-11-21 DIAGNOSIS — Z79899 Other long term (current) drug therapy: Secondary | ICD-10-CM | POA: Diagnosis not present

## 2020-11-21 DIAGNOSIS — Z419 Encounter for procedure for purposes other than remedying health state, unspecified: Secondary | ICD-10-CM | POA: Diagnosis not present

## 2020-11-21 DIAGNOSIS — J449 Chronic obstructive pulmonary disease, unspecified: Secondary | ICD-10-CM | POA: Diagnosis not present

## 2020-11-21 DIAGNOSIS — Z171 Estrogen receptor negative status [ER-]: Secondary | ICD-10-CM | POA: Diagnosis not present

## 2020-11-21 DIAGNOSIS — G8918 Other acute postprocedural pain: Secondary | ICD-10-CM | POA: Diagnosis not present

## 2020-11-21 HISTORY — PX: PORTACATH PLACEMENT: SHX2246

## 2020-11-21 HISTORY — PX: BREAST LUMPECTOMY WITH RADIOACTIVE SEED AND SENTINEL LYMPH NODE BIOPSY: SHX6550

## 2020-11-21 SURGERY — BREAST LUMPECTOMY WITH RADIOACTIVE SEED AND SENTINEL LYMPH NODE BIOPSY
Anesthesia: General | Site: Chest | Laterality: Left

## 2020-11-21 MED ORDER — ACETAMINOPHEN 500 MG PO TABS: 1000.0000 mg | ORAL_TABLET | ORAL | Status: DC

## 2020-11-21 MED ORDER — HEPARIN SOD (PORK) LOCK FLUSH 100 UNIT/ML IV SOLN
INTRAVENOUS | Status: DC | PRN
  Administered 2020-11-21: 500 [IU]

## 2020-11-21 MED ORDER — FENTANYL CITRATE (PF) 100 MCG/2ML IJ SOLN
INTRAMUSCULAR | Status: AC
  Administered 2020-11-21: 50 ug via INTRAVENOUS
  Filled 2020-11-21: qty 2

## 2020-11-21 MED ORDER — PROPOFOL 500 MG/50ML IV EMUL
INTRAVENOUS | Status: DC | PRN
  Administered 2020-11-21: 60 ug/kg/min via INTRAVENOUS

## 2020-11-21 MED ORDER — BUPIVACAINE LIPOSOME 1.3 % IJ SUSP
INTRAMUSCULAR | Status: DC | PRN
  Administered 2020-11-21: 10 mL via PERINEURAL

## 2020-11-21 MED ORDER — TECHNETIUM TC 99M TILMANOCEPT KIT
1.0000 | PACK | Freq: Once | INTRAVENOUS | Status: AC | PRN
  Administered 2020-11-21: 1 via INTRADERMAL

## 2020-11-21 MED ORDER — CHLORHEXIDINE GLUCONATE 0.12 % MT SOLN: 15.0000 mL | Freq: Once | OROMUCOSAL | Status: DC

## 2020-11-21 MED ORDER — FENTANYL CITRATE (PF) 100 MCG/2ML IJ SOLN: 50.0000 ug | Freq: Once | INTRAMUSCULAR | Status: AC

## 2020-11-21 MED ORDER — ONDANSETRON HCL 4 MG/2ML IJ SOLN
INTRAMUSCULAR | Status: DC | PRN
  Administered 2020-11-21: 4 mg via INTRAVENOUS

## 2020-11-21 MED ORDER — SODIUM CHLORIDE 0.9 % IV SOLN
INTRAVENOUS | Status: DC | PRN
  Administered 2020-11-21: 500 mL

## 2020-11-21 MED ORDER — FENTANYL CITRATE (PF) 250 MCG/5ML IJ SOLN
INTRAMUSCULAR | Status: AC
  Filled 2020-11-21: qty 5

## 2020-11-21 MED ORDER — PROPOFOL 10 MG/ML IV BOLUS
INTRAVENOUS | Status: DC | PRN
Start: 2020-11-21 — End: 2020-11-21
  Administered 2020-11-21: 100 mg via INTRAVENOUS
  Administered 2020-11-21: 40 mg via INTRAVENOUS

## 2020-11-21 MED ORDER — GABAPENTIN 300 MG PO CAPS
ORAL_CAPSULE | ORAL | Status: AC
  Administered 2020-11-21: 300 mg via ORAL
  Filled 2020-11-21: qty 1

## 2020-11-21 MED ORDER — HYDROCODONE-ACETAMINOPHEN 5-325 MG PO TABS: 1.0000 | ORAL_TABLET | Freq: Four times a day (QID) | ORAL | 0 refills | Status: DC | PRN

## 2020-11-21 MED ORDER — PHENYLEPHRINE HCL-NACL 10-0.9 MG/250ML-% IV SOLN
INTRAVENOUS | Status: DC | PRN
  Administered 2020-11-21: 25 ug/min via INTRAVENOUS

## 2020-11-21 MED ORDER — MIDAZOLAM HCL 2 MG/2ML IJ SOLN
INTRAMUSCULAR | Status: AC
  Filled 2020-11-21: qty 2

## 2020-11-21 MED ORDER — CEFAZOLIN SODIUM-DEXTROSE 2-4 GM/100ML-% IV SOLN
INTRAVENOUS | Status: AC
  Filled 2020-11-21: qty 100

## 2020-11-21 MED ORDER — CHLORHEXIDINE GLUCONATE CLOTH 2 % EX PADS: 6.0000 | MEDICATED_PAD | Freq: Once | CUTANEOUS | Status: DC

## 2020-11-21 MED ORDER — LACTATED RINGERS IV SOLN: INTRAVENOUS | Status: DC

## 2020-11-21 MED ORDER — ACETAMINOPHEN 500 MG PO TABS: 1000.0000 mg | ORAL_TABLET | Freq: Once | ORAL | Status: DC

## 2020-11-21 MED ORDER — METHYLENE BLUE 0.5 % INJ SOLN
INTRAVENOUS | Status: AC
  Filled 2020-11-21: qty 10

## 2020-11-21 MED ORDER — FENTANYL CITRATE (PF) 100 MCG/2ML IJ SOLN
INTRAMUSCULAR | Status: DC | PRN
  Administered 2020-11-21 (×2): 25 ug via INTRAVENOUS

## 2020-11-21 MED ORDER — SODIUM CHLORIDE 0.9 % IV SOLN
INTRAVENOUS | Status: AC
  Filled 2020-11-21: qty 1.2

## 2020-11-21 MED ORDER — PROPOFOL 10 MG/ML IV BOLUS
INTRAVENOUS | Status: AC
  Filled 2020-11-21: qty 20

## 2020-11-21 MED ORDER — PHENYLEPHRINE HCL (PRESSORS) 10 MG/ML IV SOLN
INTRAVENOUS | Status: DC | PRN
  Administered 2020-11-21 (×2): 100 ug via INTRAVENOUS

## 2020-11-21 MED ORDER — HEPARIN SOD (PORK) LOCK FLUSH 100 UNIT/ML IV SOLN
INTRAVENOUS | Status: AC
  Filled 2020-11-21: qty 5

## 2020-11-21 MED ORDER — BUPIVACAINE-EPINEPHRINE (PF) 0.5% -1:200000 IJ SOLN
INTRAMUSCULAR | Status: DC | PRN
  Administered 2020-11-21: 20 mL via PERINEURAL

## 2020-11-21 MED ORDER — DEXAMETHASONE SODIUM PHOSPHATE 4 MG/ML IJ SOLN
INTRAMUSCULAR | Status: DC | PRN
  Administered 2020-11-21: 8 mg via INTRAVENOUS

## 2020-11-21 MED ORDER — CHLORHEXIDINE GLUCONATE 0.12 % MT SOLN
OROMUCOSAL | Status: AC
  Administered 2020-11-21: 15 mL via OROMUCOSAL
  Filled 2020-11-21: qty 15

## 2020-11-21 MED ORDER — LIDOCAINE 2% (20 MG/ML) 5 ML SYRINGE
INTRAMUSCULAR | Status: DC | PRN
  Administered 2020-11-21: 60 mg via INTRAVENOUS

## 2020-11-21 MED ORDER — ORAL CARE MOUTH RINSE: 15.0000 mL | Freq: Once | OROMUCOSAL | Status: AC

## 2020-11-21 MED ORDER — CEFAZOLIN SODIUM-DEXTROSE 2-4 GM/100ML-% IV SOLN
2.0000 g | INTRAVENOUS | Status: AC
  Administered 2020-11-21: 2 g via INTRAVENOUS

## 2020-11-21 MED ORDER — ACETAMINOPHEN 500 MG PO TABS
ORAL_TABLET | ORAL | Status: AC
  Filled 2020-11-21: qty 2

## 2020-11-21 MED ORDER — LACTATED RINGERS IV SOLN: INTRAVENOUS | Status: DC | PRN

## 2020-11-21 MED ORDER — GABAPENTIN 300 MG PO CAPS: 300.0000 mg | ORAL_CAPSULE | ORAL | Status: AC

## 2020-11-21 MED ORDER — 0.9 % SODIUM CHLORIDE (POUR BTL) OPTIME
TOPICAL | Status: DC | PRN
  Administered 2020-11-21: 1000 mL

## 2020-11-21 MED ORDER — BUPIVACAINE HCL 0.25 % IJ SOLN
INTRAMUSCULAR | Status: DC | PRN
  Administered 2020-11-21: 20 mL

## 2020-11-21 MED ORDER — BUPIVACAINE HCL (PF) 0.25 % IJ SOLN
INTRAMUSCULAR | Status: AC
  Filled 2020-11-21: qty 30

## 2020-11-21 SURGICAL SUPPLY — 65 items
ADH SKN CLS APL DERMABOND .7 (GAUZE/BANDAGES/DRESSINGS) ×6
APL PRP STRL LF DISP 70% ISPRP (MISCELLANEOUS) ×2
APPLIER CLIP 9.375 MED OPEN (MISCELLANEOUS) ×3
APR CLP MED 9.3 20 MLT OPN (MISCELLANEOUS) ×2
BAG DECANTER FOR FLEXI CONT (MISCELLANEOUS) ×3 IMPLANT
BINDER BREAST LRG (GAUZE/BANDAGES/DRESSINGS) IMPLANT
BINDER BREAST XLRG (GAUZE/BANDAGES/DRESSINGS) IMPLANT
CANISTER SUCT 3000ML PPV (MISCELLANEOUS) ×3 IMPLANT
CHLORAPREP W/TINT 26 (MISCELLANEOUS) ×3 IMPLANT
CLIP APPLIE 9.375 MED OPEN (MISCELLANEOUS) ×2 IMPLANT
CNTNR URN SCR LID CUP LEK RST (MISCELLANEOUS) ×2 IMPLANT
CONT SPEC 4OZ STRL OR WHT (MISCELLANEOUS) ×3
COVER PROBE W GEL 5X96 (DRAPES) ×3 IMPLANT
COVER SURGICAL LIGHT HANDLE (MISCELLANEOUS) ×3 IMPLANT
COVER TRANSDUCER ULTRASND GEL (DISPOSABLE) ×3 IMPLANT
COVER WAND RF STERILE (DRAPES) ×3 IMPLANT
DERMABOND ADVANCED (GAUZE/BANDAGES/DRESSINGS) ×3
DERMABOND ADVANCED .7 DNX12 (GAUZE/BANDAGES/DRESSINGS) ×2 IMPLANT
DEVICE DUBIN SPECIMEN MAMMOGRA (MISCELLANEOUS) ×3 IMPLANT
DRAPE C-ARM 42X120 X-RAY (DRAPES) ×3 IMPLANT
DRAPE CHEST BREAST 15X10 FENES (DRAPES) ×3 IMPLANT
DRSG PAD ABDOMINAL 8X10 ST (GAUZE/BANDAGES/DRESSINGS) ×2 IMPLANT
ELECT CAUTERY BLADE 6.4 (BLADE) ×3 IMPLANT
ELECT REM PT RETURN 9FT ADLT (ELECTROSURGICAL) ×3
ELECTRODE REM PT RTRN 9FT ADLT (ELECTROSURGICAL) ×2 IMPLANT
GAUZE 4X4 16PLY RFD (DISPOSABLE) ×3 IMPLANT
GEL ULTRASOUND 20GR AQUASONIC (MISCELLANEOUS) IMPLANT
GLOVE BIO SURGEON STRL SZ8 (GLOVE) ×3 IMPLANT
GLOVE SRG 8 PF TXTR STRL LF DI (GLOVE) ×2 IMPLANT
GLOVE SURG UNDER POLY LF SZ8 (GLOVE) ×3
GOWN STRL REUS W/ TWL LRG LVL3 (GOWN DISPOSABLE) ×2 IMPLANT
GOWN STRL REUS W/ TWL XL LVL3 (GOWN DISPOSABLE) ×2 IMPLANT
GOWN STRL REUS W/TWL LRG LVL3 (GOWN DISPOSABLE) ×3
GOWN STRL REUS W/TWL XL LVL3 (GOWN DISPOSABLE) ×3
INTRODUCER COOK 11FR (CATHETERS) IMPLANT
KIT BASIN OR (CUSTOM PROCEDURE TRAY) ×3 IMPLANT
KIT MARKER MARGIN INK (KITS) ×3 IMPLANT
KIT PORT POWER 8FR ISP CVUE (Port) ×1 IMPLANT
KIT TURNOVER KIT B (KITS) ×3 IMPLANT
LIGHT WAVEGUIDE WIDE FLAT (MISCELLANEOUS) IMPLANT
NDL 18GX1X1/2 (RX/OR ONLY) (NEEDLE) IMPLANT
NDL FILTER BLUNT 18X1 1/2 (NEEDLE) IMPLANT
NDL HYPO 25GX1X1/2 BEV (NEEDLE) ×2 IMPLANT
NEEDLE 18GX1X1/2 (RX/OR ONLY) (NEEDLE) IMPLANT
NEEDLE FILTER BLUNT 18X 1/2SAF (NEEDLE)
NEEDLE FILTER BLUNT 18X1 1/2 (NEEDLE) IMPLANT
NEEDLE HYPO 25GX1X1/2 BEV (NEEDLE) ×3 IMPLANT
NS IRRIG 1000ML POUR BTL (IV SOLUTION) ×3 IMPLANT
PACK GENERAL/GYN (CUSTOM PROCEDURE TRAY) ×3 IMPLANT
PAD ARMBOARD 7.5X6 YLW CONV (MISCELLANEOUS) ×3 IMPLANT
PENCIL BUTTON HOLSTER BLD 10FT (ELECTRODE) ×3 IMPLANT
POSITIONER HEAD DONUT 9IN (MISCELLANEOUS) ×3 IMPLANT
SET INTRODUCER 12FR PACEMAKER (INTRODUCER) IMPLANT
SET SHEATH INTRODUCER 10FR (MISCELLANEOUS) IMPLANT
SHEATH COOK PEEL AWAY SET 9F (SHEATH) IMPLANT
SUT MNCRL AB 4-0 PS2 18 (SUTURE) ×3 IMPLANT
SUT PROLENE 2 0 SH 30 (SUTURE) ×3 IMPLANT
SUT VIC AB 3-0 SH 18 (SUTURE) ×3 IMPLANT
SUT VIC AB 3-0 SH 27 (SUTURE) ×3
SUT VIC AB 3-0 SH 27X BRD (SUTURE) ×2 IMPLANT
SYR 5ML LUER SLIP (SYRINGE) ×3 IMPLANT
SYR CONTROL 10ML LL (SYRINGE) ×3 IMPLANT
TOWEL GREEN STERILE (TOWEL DISPOSABLE) ×3 IMPLANT
TOWEL GREEN STERILE FF (TOWEL DISPOSABLE) ×3 IMPLANT
TRAY LAPAROSCOPIC MC (CUSTOM PROCEDURE TRAY) ×3 IMPLANT

## 2020-11-21 NOTE — Op Note (Signed)
Preoperative diagnosis: Stage I left breast cancer upper outer quadrant  Postoperative diagnosis: Same  Procedure: Left breast seed localized lumpectomy with left axillary sentinel lymph node mapping and placement of right 8 French internal jugular Port-A-Cath under fluoroscopic and ultrasound guidance  Surgeon: Erroll Luna, MD  Assistant: Dr Lonia Skinner MD   Anesthesia: LMA with left pectoral block local anesthetic of 0.25% Marcaine plain  EBL: Minimal  Specimen: Left breast tissue with seed and clip verified by Faxitron with additional margins oriented with ink and sent to pathology  Left axillary sentinel node hot x1  Drains: None  IV fluids: Per anesthesia record  Indications for procedure: The patient is a 70 year old female with stage I left breast cancer upper outer quadrant.  She is going to require postoperative chemotherapy due to her tumor make-up and presents today for breast conserving surgery after being seen by medical oncology, radiation oncology and surgery.  She opted to conserve her breast.  Risks and benefits and long-term expectations discussed with her.  Recurrence rates of mastectomy versus breast conserving surgery radiation therapy were discussed and the need for chemotherapy was discussed.  Risk of bleeding, infection, lymphedema, chronic arm pain, left groin swelling and pain, poor wound healing, death, DVT, and the risk of Port-A-Cath placement to include but not exclusive of bleeding, infection, catheter migration, collapsed lung, bleeding around the chest, lung, heart, cardiac perforation, death, DVT, catheter migration and catheter malfunction.  The procedure has been discussed with the patient. Alternatives to surgery have been discussed with the patient.  Risks of surgery include bleeding,  Infection,  Seroma formation, death,  and the need for further surgery.   The patient understands and wishes to proceed.   Description of procedure: The patient was  met in the holding area and questions were answered.  Neoprobe used to identify seed in her left breast which was placed this morning by radiology.  She underwent pectoral block per anesthesia in the left.  Films were available for review.  She also had injection of technetium sulfur colloid per radiology protocol.  All questions were answered.  She was taken back to the abdomen.  She is placed supine upon the OR table.  After induction of general esthesia the right arm was tucked left arm and left abnormal board.  Her neck and chest regions were prepped and draped in a sterile fashion.  Timeout was performed.  She received appropriate preoperative antibiotics.  The Port-A-Cath was placed first.  Ultrasound was used to identify the right internal jugular vein.  She was placed in Trendelenburg and this was punctured with a needle under direct vision and a wire was fed through this without any evidence of pulsatile blood flow or arterial injury.  C-arm showed the wire to extend down the superior vena cava into the inferior vena cava.  Small skin incision was made at the wire insertion site.  Just below the right clavicle a 3 cm incision was made for port placement.  This was dissected out using cautery and small pocket was created in the chest wall.  An 8 French port was assembled and flushed.  It was then tunneled from the lower incision to the upper incision.  It was cut to 19 cm.  With the patient Trendelenburg the dilator introducer complex was fed over the wire moving wire to and fro without any resistance.  Once this was in place, the dilator and wire were removed.  The catheter was fed through the peel-away sheath and  the peel-away sheath was peeled away.  C arm showed the tip to be in the mid superior vena cava without any immediate complication.  It drew back dark blood and flushed easily with heparinized saline.  5 cc of 100 units port per cc of heparinized saline were placed in the port.  He was secured to  the chest wall with a single stitch of 2-0 Prolene.  Incision is closed with 3-0 Vicryl for Monocryl.  The left breast lumpectomy was done next.  Neoprobe used to identify the seed in the left upper breast.  Incision was made over this area and dissection was carried down all tissue around the seed and clip were excised.  The posterior margin was the pectoralis major and the anterior margin was the skin.  I took additional superior, lateral and inferomedial margins with the tumor itself.  All margins were inked and the Faxitron image revealed the seed and clip to be in the specimen.  Irrigation was then used in the cavity.  The cath was clipped.  The neoprobe settings were then changed to technetium.  Hotspot identified in left axilla.  A 3 cm incision was made dissection was carried down into the level 1 axillary lymph node basin.  A hot node was identified and removed.  Background counts approached baseline.  There were no other significant hotspots in axilla and this was sent as a sentinel node to pathology.  Hemostasis was achieved.  Irrigation was used for both incisions.  Both incisions were closed with combination of 3-0 Vicryl and 4-0 Monocryl.  Dermabond applied to both incisions.  Breast binder placed.  All final counts were found to be correct.  The patient was awoke extubated taken recovery in satisfactory condition.

## 2020-11-21 NOTE — Discharge Instructions (Signed)
[place] Office Phone Number [phone]  BREAST BIOPSY/ PARTIAL MASTECTOMY: POST OP INSTRUCTIONS  Always review your discharge instruction sheet given to you by the facility where your surgery was performed.  IF YOU HAVE DISABILITY OR FAMILY LEAVE FORMS, YOU MUST BRING THEM TO THE OFFICE FOR PROCESSING.  DO NOT GIVE THEM TO YOUR DOCTOR.  1. A prescription for pain medication may be given to you upon discharge.  Take your pain medication as prescribed, if needed.  If narcotic pain medicine is not needed, then you may take acetaminophen (Tylenol) or ibuprofen (Advil) as needed. 2. Take your usually prescribed medications unless otherwise directed 3. If you need a refill on your pain medication, please contact your pharmacy.  They will contact our office to request authorization.  Prescriptions will not be filled after 5pm or on week-ends. 4. You should eat very light the first 24 hours after surgery, such as soup, crackers, pudding, etc.  Resume your normal diet the day after surgery. 5. Most patients will experience some swelling and bruising in the breast.  Ice packs and a good support bra will help.  Swelling and bruising can take several days to resolve.  6. It is common to experience some constipation if taking pain medication after surgery.  Increasing fluid intake and taking a stool softener will usually help or prevent this problem from occurring.  A mild laxative (Milk of Magnesia or Miralax) should be taken according to package directions if there are no bowel movements after 48 hours. 7. Unless discharge instructions indicate otherwise, you may remove your bandages 24-48 hours after surgery, and you may shower at that time.  You may have steri-strips (small skin tapes) in place directly over the incision.  These strips should be left on the skin for 7-10 days.  If your surgeon used skin glue on the incision, you may shower in 24 hours.  The glue will flake off over the  next 2-3 weeks.  Any sutures or staples will be removed at the office during your follow-up visit. 8. ACTIVITIES:  You may resume regular daily activities (gradually increasing) beginning the next day.  Wearing a good support bra or sports bra minimizes pain and swelling.  You may have sexual intercourse when it is comfortable. a. You may drive when you no longer are taking prescription pain medication, you can comfortably wear a seatbelt, and you can safely maneuver your car and apply brakes. b. RETURN TO WORK:  ______________________________________________________________________________________ 9. You should see your doctor in the office for a follow-up appointment approximately two weeks after your surgery.  Your doctor's nurse will typically make your follow-up appointment when she calls you with your pathology report.  Expect your pathology report 2-3 business days after your surgery.  You may call to check if you do not hear from [place] after three days. 10. OTHER INSTRUCTIONS: _______________________________________________________________________________________________ _____________________________________________________________________________________________________________________________________ _____________________________________________________________________________________________________________________________________ _____________________________________________________________________________________________________________________________________  WHEN TO CALL YOUR DOCTOR: 1. Fever over 101.0 2. Nausea and/or vomiting. 3. Extreme swelling or bruising. 4. Continued bleeding from incision. 5. Increased pain, redness, or drainage from the incision.  The clinic staff is available to answer your questions during regular business hours.  Please don't hesitate to call and ask to speak to one of the nurses for clinical concerns.  If you have a medical emergency, go to the nearest  emergency room or call 911.  A surgeon from [facility] is always on call at the hospital.  For further questions, please visit [email or web address]  PORT-A-CATH: POST OP INSTRUCTIONS  Always review your discharge instruction sheet given to you by the facility where your surgery was performed.   1. A prescription for pain medication may be given to you upon discharge. Take your pain medication as prescribed, if needed. If narcotic pain medicine is not needed, then you make take acetaminophen (Tylenol) or ibuprofen (Advil) as needed.  2. Take your usually prescribed medications unless otherwise directed. 3. If you need a refill on your pain medication, please contact our office. All narcotic pain medicine now requires a paper prescription.  Phoned in and fax refills are no longer allowed by law.  Prescriptions will not be filled after 5 pm or on weekends.  4. You should follow a light diet for the remainder of the day after your procedure. 5. Most patients will experience some mild swelling and/or bruising in the area of the incision. It may take several days to resolve. 6. It is common to experience some constipation if taking pain medication after surgery. Increasing fluid intake and taking a stool softener (such as Colace) will usually help or prevent this problem from occurring. A mild laxative (Milk of Magnesia or Miralax) should be taken according to package directions if there are no bowel movements after 48 hours.  7. Unless discharge instructions indicate otherwise, you may remove your bandages 48 hours after surgery, and you may shower at that time. You may have steri-strips (small white skin tapes) in place directly over the incision.  These strips should be left on the skin for 7-10 days.  If your surgeon used Dermabond (skin glue) on the incision, you may shower in 24 hours.  The glue will flake off over the next 2-3 weeks.  8. If your port is left accessed at the  end of surgery (needle left in port), the dressing cannot get wet and should only by changed by a healthcare professional. When the port is no longer accessed (when the needle has been removed), follow step 7.   9. ACTIVITIES:  Limit activity involving your arms for the next 72 hours. Do no strenuous exercise or activity for 1 week. You may drive when you are no longer taking prescription pain medication, you can comfortably wear a seatbelt, and you can maneuver your car. 10.You may need to see your doctor in the office for a follow-up appointment.  Please       check with your doctor.  11.When you receive a new Port-a-Cath, you will get a product guide and        ID card.  Please keep them in case you need them.  WHEN TO CALL YOUR DOCTOR [phone]): 1. Fever over 101.0 2. Chills 3. Continued bleeding from incision 4. Increased redness and tenderness at the site 5. Shortness of breath, difficulty breathing   The clinic staff is available to answer your questions during regular business hours. Please don't hesitate to call and ask to speak to one of the nurses or medical assistants for clinical concerns. If you have a medical emergency, go to the nearest emergency room or call 911.  A surgeon from [facility] is always on call at the hospital.     For further information, please visit [email or web address]

## 2020-11-21 NOTE — Anesthesia Procedure Notes (Signed)
Anesthesia Regional Block: Pectoralis block   Pre-Anesthetic Checklist: ,, timeout performed, Correct Patient, Correct Site, Correct Laterality, Correct Procedure, Correct Position, site marked, Risks and benefits discussed, pre-op evaluation,  At surgeon's request and post-op pain management  Laterality: Left  Prep: Maximum Sterile Barrier Precautions used, chloraprep       Needles:  Injection technique: Single-shot  Needle Type: Echogenic Stimulator Needle     Needle Length: 9cm  Needle Gauge: 22     Additional Needles:   Procedures:,,,, ultrasound used (permanent image in chart),,,,  Narrative:  Start time: 11/21/2020 10:20 AM End time: 11/21/2020 10:22 AM Injection made incrementally with aspirations every 5 mL.  Performed by: Personally  Anesthesiologist: Brennan Bailey, MD  Additional Notes: Risks, benefits, and alternative discussed. Patient gave consent for procedure. Patient prepped and draped in sterile fashion. Sedation administered, patient remains easily responsive to voice. Relevant anatomy identified with ultrasound guidance. Local anesthetic given in 5cc increments with no signs or symptoms of intravascular injection. No pain or paraesthesias with injection. Patient monitored throughout procedure with signs of LAST or immediate complications. Tolerated well. Ultrasound image placed in chart.  Tawny Asal, MD

## 2020-11-21 NOTE — Anesthesia Procedure Notes (Signed)
Procedure Name: LMA Insertion Date/Time: 11/21/2020 10:48 AM Performed by: Eligha Bridegroom, CRNA Pre-anesthesia Checklist: Patient identified, Emergency Drugs available, Suction available, Patient being monitored and Timeout performed Patient Re-evaluated:Patient Re-evaluated prior to induction Oxygen Delivery Method: Circle system utilized Preoxygenation: Pre-oxygenation with 100% oxygen Induction Type: IV induction LMA: LMA inserted LMA Size: 4.0 Placement Confirmation: positive ETCO2 and breath sounds checked- equal and bilateral Tube secured with: Tape Dental Injury: Teeth and Oropharynx as per pre-operative assessment

## 2020-11-21 NOTE — Transfer of Care (Signed)
Immediate Anesthesia Transfer of Care Note  Patient: Valerie Mosley  Procedure(s) Performed: LEFT BREAST LUMPECTOMY WITH RADIOACTIVE SEED AND LEFT SENTINEL LYMPH NODE [place] (Left Breast) INSERTION PORT-A-CATH (Left Chest)  Patient Location: PACU  Anesthesia Type:General  Level of Consciousness: drowsy and patient cooperative  Airway & Oxygen Therapy: Patient Spontanous Breathing and Patient connected to nasal cannula oxygen  Post-op Assessment: Report given to RN and Post -op Vital signs reviewed and stable  Post vital signs: Reviewed and stable  Last Vitals:  Vitals Value Taken Time  BP 112/74 11/21/20 1229  Temp    Pulse 110 11/21/20 1230  Resp 24 11/21/20 1230  SpO2 91% 11/21/20 1231  Vitals shown include unvalidated device data.  Last Pain:  Vitals:   11/21/20 1025  TempSrc:   PainSc: 0-No pain      Patients Stated Pain Goal: 3 (16/10/96 0454)  Complications: No complications documented.

## 2020-11-21 NOTE — Interval H&P Note (Signed)
History and Physical Interval Note:  11/21/2020 9:31 AM  Valerie Mosley  has presented today for surgery, with the diagnosis of LEFT BREAST CANCER.  The various methods of treatment have been discussed with the patient and family. After consideration of risks, benefits and other options for treatment, the patient has consented to  Procedure(s) with comments: LEFT BREAST LUMPECTOMY WITH RADIOACTIVE SEED AND LEFT SENTINEL LYMPH NODE [place] (Left) - PEC BLOCK; START TIME OF 11:00 AM FOR 90 MINUTES IN ROOM 2 INSERTION PORT-A-CATH (Left) as a surgical intervention.  The patient's history has been reviewed, patient examined, no change in status, stable for surgery.  I have reviewed the patient's chart and labs.  Questions were answered to the patient's satisfaction.   The procedure has been discussed with the patient. Alternatives to surgery have been discussed with the patient.  Risks of surgery include bleeding,  Infection,  Seroma formation, death,  and the need for further surgery.   The patient understands and wishes to proceed.   Port risk includes bleeding, infection collapse lung , bleeding around the heart,  death, DVT, catheter migration, catheter malfunction and the need for other procedures. This is necessary for chemotherapy.   Turner Daniels MD

## 2020-11-21 NOTE — Anesthesia Postprocedure Evaluation (Signed)
Anesthesia Post Note  Patient: SALIMAH MARTINOVICH  Procedure(s) Performed: LEFT BREAST LUMPECTOMY WITH RADIOACTIVE SEED AND LEFT SENTINEL LYMPH NODE [place] (Left Breast) INSERTION PORT-A-CATH (Left Chest)     Patient location during evaluation: PACU Anesthesia Type: General Level of consciousness: awake and alert and oriented Pain management: pain level controlled Vital Signs Assessment: post-procedure vital signs reviewed and stable Respiratory status: spontaneous breathing, nonlabored ventilation and respiratory function stable Cardiovascular status: blood pressure returned to baseline Postop Assessment: no apparent nausea or vomiting Anesthetic complications: no   No complications documented.  Last Vitals:  Vitals:   11/21/20 1309 11/21/20 1310  BP: 103/68   Pulse: 93 90  Resp: 17 (!) 24  Temp: 36.5 C   SpO2: 93% 92%    Last Pain:  Vitals:   11/21/20 1300  TempSrc:   PainSc: 0-No pain                 Brennan Bailey

## 2020-11-22 ENCOUNTER — Encounter (HOSPITAL_COMMUNITY): Payer: Self-pay | Admitting: Surgery

## 2020-11-27 ENCOUNTER — Encounter: Payer: Self-pay | Admitting: *Deleted

## 2020-11-28 ENCOUNTER — Ambulatory Visit: Payer: Self-pay | Admitting: Surgery

## 2020-11-28 LAB — SURGICAL PATHOLOGY

## 2020-11-28 NOTE — H&P (Signed)
[name] Appointment: 11/28/2020 10:00 AM Location: [facility] Patient #: [id number] DOB: [birth year] Married / Language: [name] / Race: White Female  History of Present Illness Marcello Moores A. Katana Berthold MD; 11/28/2020 11:02 AM) Patient words: Patient returns after left breast lumpectomy. Her anterior margin was read as positive. No feeling tissue left at that point the skin. She also has a small sebaceous cyst of her left clavicle she wants excised. The ports causing her pain and she has some pain in her left scapula as well. No shortness of breath or chest pain. She has no right-sided chest pain except. A port is.     FINAL MICROSCOPIC DIAGNOSIS:  A. BREAST, LEFT, LUMPECTOMY: - Invasive ductal carcinoma, grade 3, 1.3 cm - The anterior resection margin is widely involved by carcinoma - Carcinoma is 0.2 cm from posterior margin - Biopsy site changes - See oncology table  B. BREAST, LEFT ADDITIONAL SUPERIOR MARGIN, EXCISION: - Unremarkable fibroadipose tissue, negative for carcinoma  C. BREAST, LEFT ADDITIONAL LATERAL MARGIN, EXCISION: - Unremarkable fibroadipose tissue, negative for carcinoma  D. BREAST, LEFT ADDITIONAL INFEROMEDIAL MARGIN, EXCISION: - Unremarkable fibroadipose tissue, negative for carcinoma  E. LYMPH NODE, LEFT AXILLARY, SENTINEL, EXCISION: - Lymph node, negative for carcinoma (0/1)     ONCOLOGY TABLE:  INVASIVE CARCINOMA OF THE BREAST: Resection  Procedure: Lumpectomy Specimen Laterality: Left Histologic Type: Invasive ductal carcinoma Histologic Grade: Glandular (Acinar)/Tubular Differentiation: 3 Nuclear Pleomorphism: 3 Mitotic Rate: 3 Overall Grade: 3 Tumor Size: 1.3 cm Ductal Carcinoma In Situ: Not identified Treatment Effect in the Breast: No known presurgical therapy Margins: Anterior margin is widely involved by carcinoma Regional Lymph Nodes: Number of Lymph Nodes Examined: 1 Number of Sentinel  Nodes Examined: 1 Number of Lymph Nodes with Macrometastases (>2 mm): 0 Number of Lymph Nodes with Micrometastases: 0 Number of Lymph Nodes with Isolated Tumor Cells (=0.2 mm or =200 cells): 0 Size of Largest Metastatic Deposit (mm): Not applicable Extranodal Extension: Not applicable Distant Metastasis: Distant Site(s) Involved: Not applicable Breast Biomarker Testing Performed on Previous Biopsy: No Pathologic Stage Classification (pTNM, AJCC 8th Edition): pT1c, pN0 Representative Tumor Block: A2 Comment(s): The breast prognostic profile is pending and will be reported in an addendum.  The patient is a 70 year old female.    Physical Exam (Shanetha Bradham A. Steffon Gladu MD; 11/28/2020 11:02 AM)  Integumentary Note: 1 cm epidural inclusion cyst just above her left clavicle.  Breast Note: Right-sided Port-A-Cath looks intact. No signs of infection. Left breast incision clean dry and intact left axillary incision clean dry and intact. She has point tenderness of her left scapula spine made worse with movement.    Assessment & Plan (Herman Fiero A. Cassidie Veiga MD; 11/28/2020 11:03 AM)  POST-OPERATIVE STATE [phone]) Impression: Review pathology. Her hepatobiliary nature skin. The only option is to excise her skin which I discussed with her today. She will receive chemotherapy and radiation therapy as directed by her other physicians. I will schedule her for reexcision left breast lumpectomy and cyst removal but I don't expect to find much since only skin is left behind but I think this would put the issue to rest for future treatment.  Chest x-ray to further evaluate her left scapular pain. History of smoking noted and left lung surgery in the remote past for lung cancer noted. Recommend tramadol 50 mg every 6 hours as needed for pain.  Current Plans Started traMADol HCl 50 MG Oral Tablet, 1 (one) Tablet four times daily, as needed, #15, 11/28/2020, No Refill. Pt  Education -  CCS Free Text Education/Instructions: discussed with patient and provided information.  CYST OF SKIN (L72.9) Impression: left neck pt desires excision can do this during re excison

## 2020-11-29 ENCOUNTER — Ambulatory Visit
Admission: RE | Admit: 2020-11-29 | Discharge: 2020-11-29 | Disposition: A | Discharge status: Home / Self Care | Payer: Medicare Other | Source: Ambulatory Visit | Attending: General Surgery | Admitting: General Surgery

## 2020-11-29 ENCOUNTER — Other Ambulatory Visit: Payer: Self-pay | Admitting: General Surgery

## 2020-11-29 DIAGNOSIS — R0602 Shortness of breath: Secondary | ICD-10-CM | POA: Diagnosis not present

## 2020-11-29 DIAGNOSIS — Z95828 Presence of other vascular implants and grafts: Secondary | ICD-10-CM

## 2020-12-04 ENCOUNTER — Other Ambulatory Visit: Payer: Medicare Other

## 2020-12-06 NOTE — Progress Notes (Incomplete)
[place]   [phone] [phone]   Clinic Follow up Note   Patient Care Team: [name], MD as PCP - General (Family Medicine) Collene Gobble, MD (Pulmonary Disease) Erroll Luna, MD as Consulting Physician (General Surgery) Truitt Merle, MD as Consulting Physician (Hematology)  Date of Service:  12/06/2020  CHIEF COMPLAINT: F/u of left breast cancer   SUMMARY OF ONCOLOGIC HISTORY: Oncology History Overview Note  Cancer Staging Non-small cell lung cancer [facility]) Staging form: Lung, AJCC 7th Edition - Clinical: Stage IA (Free text: Ia) - Signed by [name], MD on 12/28/2013 Laterality: Left Cancer stage: Ia - Pathologic: Ia - Signed by [name], MD on 12/28/2013 Laterality: Left Cancer stage: Ia    Non-small cell lung cancer ([place])  09/02/2011 Imaging   CT Chest  IMPRESSION:   1.  Interval clearing of right upper lobe pneumonia.  2.  Left upper lobe nodule is unchanged in the short interval from  07/29/2011.  Follow-up could be performed in 3 months to ensure  continued stability. This recommendation follows the consensus  statement: Guidelines for Management of Small Pulmonary Nodules  Detected on CT Scans:  A Statement from the [place] as  published in Radiology 2005; 237:395-400.  Available online at:  [email or web address]  3.  Additional scattered pulmonary nodules can be reevaluated on  future imaging as well.    11/06/2011 Imaging   PET IMPRESSION:   1.  Mild hypermetabolism which projects minimally cephalad to, but  is felt to correspond to the left upper lobe lung nodule.  Although  not within malignant range, given the small size of the nodule,  malignancy cannot be excluded.  Possible mild interval enlargement  of the nodule since 09/02/2011.  Consider repeat standard chest CT  to confirm interval enlargement.  Especially if interval   enlargement has occurred, tissue sampling would be suggested.  Alternatively, 31-monthfollow-up chest CT could be performed.  2.  No evidence of thoracic nodal or extrathoracic disease.  3. Vague hypermetabolism corresponding to a prominent right lobe of  the thyroid. Nonspecific.  Consider ultrasound correlation.IMPRESSION:   1.  Mild hypermetabolism which projects minimally cephalad to, but  is felt to correspond to the left upper lobe lung nodule.  Although  not within malignant range, given the small size of the nodule,  malignancy cannot be excluded.  Possible mild interval enlargement  of the nodule since 09/02/2011.  Consider repeat standard chest CT  to confirm interval enlargement.  Especially if interval  enlargement has occurred, tissue sampling would be suggested.  Alternatively, 366-monthollow-up chest CT could be performed.  2.  No evidence of thoracic nodal or extrathoracic disease.  3. Vague hypermetabolism corresponding to a prominent right lobe of  the thyroid. Nonspecific.  Consider ultrasound correlation.   12/17/2011 Surgery   Thoracoscopy by Dr [name] Diagnosis 1. Lung, wedge biopsy/resection, left upper lobe - INVASIVE WELL-DIFFERENTIATED ADENOCARCINOMA, SPANNING 1.2 CM. - NO LYMPH VASCULAR INVASION IDENTIFIED. - PLEURA IS UNINVOLVED. - MARGINS ARE NEGATIVE. - SEE ONCOLOGY TEMPLATE. 2. Lymph node, biopsy, level 9 - ONE BENIGN LYMPH NODE WITH NO TUMOR SEEN (0/1). 3. Lymph node, biopsy, level 9 - ONE BENIGN LYMPH NODE WITH NO TUMOR SEEN (0/1). 4. Lymph node, biopsy, 11 - ONE BENIGN LYMPH NODE WITH NO TUMOR SEEN (0/1). 5. Lymph node, biopsy, 11 #2 - ONE BENIGN LYMPH NODE WITH NO TUMOR SEEN (0/1). 6. Lymph node, biopsy, 5 - ONE BENIGN LYMPH  NODE WITH NO TUMOR SEEN (0/1). 7. Lymph node, biopsy, 10 - ONE BENIGN LYMPH NODE WITH NO TUMOR SEEN (0/1). 8. Lymph node, biopsy, 11 #3 - ONE BENIGN LYMPH NODE WITH NO TUMOR SEEN (0/1). 9. Lung, resection  (segmental or lobe), Remainder of left upper lobe - BENIGN LUNG PARENCHYMA WITH CHRONIC INFLAMMATION AND INCREASED PULMONARY MACROPHAGES. - THREE BENIGN LYMPH NODES IDENTIFIED (0/3). - NO TUMOR SEEN.    01/08/2012 Initial Diagnosis   Non-small cell lung cancer ([place])   02/10/2018 Imaging   CT Chest IMPRESSION: 1. Enlarging irregular pleural-based solid 1.9 cm pulmonary nodule in the superior segment left lower lobe, suspicious for malignancy, either recurrent disease or a metachronous primary bronchogenic carcinoma. Consider PET-CT for further characterization. 2. Additional scattered bilateral pulmonary nodules are stable and considered benign. 3. No noncontrast CT evidence of thoracic adenopathy. 4. Three-vessel coronary atherosclerosis. 5. Nonobstructing left nephrolithiasis.   Aortic Atherosclerosis (ICD10-I70.0) and Emphysema (ICD10-J43.9).   02/18/2018 PET scan   IMPRESSION: 1. Hypermetabolic apical nodule in the superior segment left lower lobe is highly worrisome for bronchogenic carcinoma. 2. No additional areas of abnormal hypermetabolism in the neck, chest, abdomen or pelvis. 3. Aortic atherosclerosis (ICD10-170.0). Coronary artery calcification. 4.  Emphysema (ICD10-J43.9). 5. Left renal stone.     03/06/2018 Pathology Results   Lung Biopsy  Diagnosis Lung, needle/core biopsy(ies), Left Lower Lobe - ADENOCARCINOMA. Microscopic Comment There is likely sufficient tissue for additional studies if requested (block 1B). Dr. Lyndon Code has reviewed the case.   04/08/2018 Surgery   Left Video assisted Thoracoscopy by Dr Roxan Hockey  Diagnosis Lung, wedge biopsy/resection, Left Lower Lobe - INVASIVE ADENOCARCINOMA, MODERATELY DIFFERENTIATED, SPANNING 1.3 CM. - ADENOCARCINOMA INVOLVES VISCERAL PLEURA. - ONE BENIGN INTRAPULMONARY LYMPH NODE (0/1). - LYMPHOVASCULAR INVASION IS IDENTIFIED, FOCAL. - THE SURGICAL RESECTION MARGINS ARE NEGATIVE FOR CARCINOMA. - SEE ONCOLOGY  TABLE BELOW.    08/01/2020 Imaging   CT Chest  IMPRESSION: Stable postop changes from prior left upper lobectomy. Stable small bilateral pulmonary nodules. No new or progressive disease within the thorax.   Aortic Atherosclerosis (ICD10-I70.0) and Emphysema (ICD10-J43.9).   Malignant neoplasm of upper-inner quadrant of left breast in female, estrogen receptor negative ([place])  10/18/2020 Mammogram   Mammogram 10/18/20 at [facility] FINDINGS:  Cc and MLO views of bilateral breasts are submitted. There is a  spiculated mass in the palpable area upper left breast. The right  breast is negative.   Targeted ultrasound is performed, showing 1.2 x 0.9 x 1.2 cm  spiculated hypoechoic mass at the left breast 11 o'clock 8 cm from  nipple palpable area. This correlates to the mammographic mass.  Ultrasound of the left axilla is negative.   IMPRESSION:  Highly suspicious findings.     10/26/2020 Initial Biopsy   Final Pathologic Diagnosis  10/26/20 at [facility] BREAST, LEFT 1:00 8 CM FROM NIPPLE, NEEDLE BIOPSY:              INVASIVE DUCTAL CARCINOMA, NOTTINGHAM GRADE 3. Comment   The Nottingham grade is 3 (3-tubule formation, 3-nuclear atypia, 3-mitoses).  This case was reviewed by Dr. Vicenta Dunning and she is in essential agreement with the above diagnosis.  Final Pathologic Diagnosis   Prognostic Markers in Cancer   Block Number:  [id number] Diagnosis:  Invasive ductal carcinoma     Results:   Estrogen Receptor:  Negative (<1%)   Progesterone Receptor:  Negative (<1%)   Her2:  Negative (score=1+)     Ki-67:  Percentage of tumor cells with nuclear positivity: 60 %  11/01/2020 Cancer Staging   Staging form: Breast, AJCC 8th Edition - Clinical stage from 11/01/2020: Stage IB (cT1c, cN0, cM0, G3, ER-, PR-, HER2-) - Signed by Truitt Merle, MD on 11/08/2020 Stage prefix: Initial diagnosis   11/08/2020 Initial Diagnosis   Malignant neoplasm of upper-inner quadrant of left breast in female,  estrogen receptor negative ([place])      CURRENT THERAPY:  PENDING Re-excision breast surgery***  INTERVAL HISTORY: *** Valerie Mosley is here for a follow up. She presents to the clinic alone.    REVIEW OF SYSTEMS:  *** Constitutional: Denies fevers, chills or abnormal weight loss Eyes: Denies blurriness of vision Ears, nose, mouth, throat, and face: Denies mucositis or sore throat Respiratory: Denies cough, dyspnea or wheezes Cardiovascular: Denies palpitation, chest discomfort or lower extremity swelling Gastrointestinal:  Denies nausea, heartburn or change in bowel habits Skin: Denies abnormal skin rashes Lymphatics: Denies new lymphadenopathy or easy bruising Neurological:Denies numbness, tingling or new weaknesses Behavioral/Psych: Mood is stable, no new changes  All other systems were reviewed with the patient and are negative.  MEDICAL HISTORY:  Past Medical History:  Diagnosis Date  . Breast cancer ([place])   . Cancer ([place])    Stage IA non-small cell lung cancer, left upper lobectomy 12/2011  . COPD (chronic obstructive pulmonary disease) ([place])   . Cough 07/29/11   started coughing up blood this am  . Cough   . Hypertension   . Kyphoscoliosis   . Pneumonia   . PONV (postoperative nausea and vomiting)   . Recurrent upper respiratory infection (URI)     SURGICAL HISTORY: Past Surgical History:  Procedure Laterality Date  . BREAST LUMPECTOMY WITH RADIOACTIVE SEED AND SENTINEL LYMPH NODE BIOPSY Left 11/21/2020   Procedure: LEFT BREAST LUMPECTOMY WITH RADIOACTIVE SEED AND LEFT SENTINEL LYMPH NODE [place];  Surgeon: Erroll Luna, MD;  Location: [place];  Service: General;  Laterality: Left;  PEC BLOCK; START TIME OF 11:00 AM FOR 90 MINUTES IN ROOM 2  . BRONCHOSCOPY    . CATARACT EXTRACTION, BILATERAL  2016  . EYE SURGERY    . INSERTION OF IBV VALVE Left 04/16/2018   Procedure: INSERTION OF INTERBRONCHIAL VALVE (IBV);  Surgeon: [name], MD;  Location: [place];  Service: Thoracic;  Laterality: Left;  . INSERTION OF IBV VALVE N/A 06/25/2018   Procedure: REMOVAL OF THREE INTERBRONCHIAL VALVE (IBV);  Surgeon: [name], MD;  Location: [place];  Service: Thoracic;  Laterality: N/A;  . LOBECTOMY  12/17/2011   Procedure: LOBECTOMY;  Surgeon: [name], MD;  Location: [place];  Service: Thoracic;  Laterality: Left;  (L)VATS, WEDGE RESECTION, LEFT UPPER LOBECTOMY,  Multiple node biopsies  . PORTACATH PLACEMENT Left 11/21/2020   Procedure: INSERTION PORT-A-CATH;  Surgeon: Erroll Luna, MD;  Location: [place];  Service: General;  Laterality: Left;  . REMOVAL OF PLEURAL DRAINAGE CATHETER Left 05/06/2018   Procedure: REMOVAL OF CHEST TUBE;  Surgeon: [name], MD;  Location: [place];  Service: Thoracic;  Laterality: Left;  . THYROID SURGERY  1980   1/2 thyroid removed - benign  . URETHRA SURGERY    . VIDEO ASSISTED THORACOSCOPY Left 04/08/2018   Procedure: REDO LEFT VIDEO ASSISTED THORACOSCOPY with left lower lobe wedge resection;  Surgeon: [name], MD;  Location: [place];  Service: Thoracic;  Laterality: Left;  Marland Kitchen VIDEO BRONCHOSCOPY N/A 04/16/2018   Procedure: VIDEO BRONCHOSCOPY;  Surgeon: [name], MD;  Location: [place];  Service: Thoracic;  Laterality:  N/A;  . VIDEO BRONCHOSCOPY N/A 06/25/2018   Procedure: VIDEO BRONCHOSCOPY;  Surgeon: [name], MD;  Location: [facility] OR;  Service: Thoracic;  Laterality: N/A;    I have reviewed the social history and family history with the patient and they are unchanged from previous note.  ALLERGIES:  has No Known Allergies.  MEDICATIONS:  Current Outpatient Medications  Medication Sig Dispense Refill  . acetaminophen (TYLENOL) 500 MG tablet Take 1,000 mg by mouth every 6 (six) hours as needed for moderate pain or headache.    Marland Kitchen acyclovir ointment (ZOVIRAX) 5 % Apply 1 application topically every 3 (three) hours as needed. (Patient taking differently: Apply 1  application topically every 3 (three) hours as needed (cold sores).) 15 g 0  . albuterol (VENTOLIN HFA) 108 (90 Base) MCG/ACT inhaler Inhale 2 puffs into the lungs every 4 (four) hours as needed for wheezing or shortness of breath. For shortness of breath 8.51 g 5  . alendronate (FOSAMAX) 70 MG tablet Take 70 mg by mouth every Friday.  (Patient not taking: Reported on 11/15/2020)    . amLODipine (NORVASC) 10 MG tablet Take 10 mg by mouth daily.    Marland Kitchen BEVESPI AEROSPHERE 9-4.8 MCG/ACT AERO INHALE 2 PUFFS INTO THE LUNGS 2 TIMES DAILY. (Patient taking differently: Inhale 2 puffs into the lungs in the morning and at bedtime.) 10.7 g 11  . colestipol (COLESTID) 1 g tablet Take 1 g by mouth daily.    Marland Kitchen doxycycline (VIBRA-TABS) 100 MG tablet Take 1 tablet (100 mg total) by mouth 2 (two) times daily. 14 tablet 0  . fluticasone (FLONASE) 50 MCG/ACT nasal spray Place 2 sprays into both nostrils daily as needed for allergies. 16 g 2  . HYDROcodone-acetaminophen (NORCO/VICODIN) 5-325 MG tablet Take 1 tablet by mouth every 6 (six) hours as needed for moderate pain. 15 tablet 0  . montelukast (SINGULAIR) 10 MG tablet Take 1 tablet (10 mg total) by mouth at bedtime. 30 tablet 5  . sertraline (ZOLOFT) 50 MG tablet Take 50 mg by mouth at bedtime.     No current facility-administered medications for this visit.    PHYSICAL EXAMINATION: ECOG PERFORMANCE STATUS: {CHL ONC ECOG PS:[phone]}  There were no vitals filed for this visit. There were no vitals filed for this visit. *** GENERAL:alert, no distress and comfortable SKIN: skin color, texture, turgor are normal, no rashes or significant lesions EYES: normal, Conjunctiva are pink and non-injected, sclera clear {OROPHARYNX:no exudate, no erythema and lips, buccal mucosa, and tongue normal}  NECK: supple, thyroid normal size, non-tender, without nodularity LYMPH:  no palpable lymphadenopathy in the cervical, axillary {or inguinal} LUNGS: clear to auscultation  and percussion with normal breathing effort HEART: regular rate & rhythm and no murmurs and no lower extremity edema ABDOMEN:abdomen soft, non-tender and normal bowel sounds Musculoskeletal:no cyanosis of digits and no clubbing  NEURO: alert & oriented x 3 with fluent speech, no focal motor/sensory deficits  LABORATORY DATA:  I have reviewed the data as listed CBC Latest Ref Rng & Units 06/14/2019 11/30/2018 06/17/2018  WBC 4.0 - 10.5 K/uL 7.0 9.1 8.6  Hemoglobin 12.0 - 15.0 g/dL 15.8(H) 15.0 13.7  Hematocrit 36.0 - 46.0 % 46.9(H) 45.9 42.5  Platelets 150 - 400 K/uL 290 312 407(H)     CMP Latest Ref Rng & Units 06/14/2019 11/30/2018 06/17/2018  Glucose 70 - 99 mg/dL 85 94 90  BUN 8 - 23 mg/dL _0 Creatinine 0.44 - 1.00 mg/dL 0.69 1.02(H) 0.60  Sodium 135 - 145 mmol/L 137 139 136  Potassium 3.5 - 5.1 mmol/L 3.5 4.5 3.6  Chloride 98 - 111 mmol/L 102 106 105  CO2 22 - 32 mmol/L _0 Calcium 8.9 - 10.3 mg/dL 8.6(L) 8.8(L) 9.0  Total Protein 6.5 - 8.1 g/dL 7.0 7.2 6.6  Total Bilirubin 0.3 - 1.2 mg/dL 0.6 0.3 0.4  Alkaline Phos 38 - 126 U/L 122 130(H) 108  AST 15 - 41 U/L _1 ALT 0 - 44 U/L _2 RADIOGRAPHIC STUDIES: I have personally reviewed the radiological images as listed and agreed with the findings in the report. No results found.   ASSESSMENT & PLAN:  TRAEH MILROY is a 70 y.o. female with    1.  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 She palpated her breast mass herself in mid January. Her mammogram showed a 1.2cm mass in the 11:00 position of her left breast. Biopsy showed invasive ductal carcinoma, triple negative.  -Given the early stage disease, she is likely a candidate for lumpectomy with sentinel LN biopsy.  Due to the aggressive nature of triple negative disease, and  her overall good health, I do feel SLB is beneficial.  she is agreeable and will proceed with surgery on 11/21/20 with Dr Brantley Stage. -The risk of recurrence depends on the stage and biology of the tumor. She is early stage, with ER/PR/HER2 negative markers. I discussed this is the more aggressive type of breast cancer  with high risk of recurrence.  -To reduce her risk of recurrence, I recommend adjuvant chemotherapy after surgery. Depends on her final surgical path result, I will determine her chemo regimen. If node negative and tumor less than 2cm, I recommend moderate intensive TC q3weeks for 4 cycles       --Chemotherapy consent: Side effects including but does not limited to, fatigue, nausea, vomiting, diarrhea, hair loss, neuropathy, fluid retention, renal and kidney dysfunction, neutropenic fever, needed for blood transfusion, bleeding, were discussed with patient in great detail. She agrees to proceed with chemo.  -I discussed PAC placement if indicated. She will have chemo education class before start of treatment.  -To reduce her risk of local recurrence, Adjuvant radiation is recommended after lumpectomy to reduce her risk of local recurrence. I will refer her to Dr Isidore Moos after chemotherapy.  -Given the negative ER and PR expression, she does not benefit from antiestrogen therapy, which I do not recommend.  -Proceed with surgery soon. I will f/u with her in 4 weeks to further discuss adjuvant chemotherapy.    2. RUL stage IA, T1 a, N0, M0) non-small cell lung cancer, adenocarcinoma Dx in 2013 + LLL stage Ib (T2, and 0, M0) non-small cell lung cancer, adenocarcinoma Dx 02/2018. -She was treated with right upper lobectomy in April 2013 initially.  -She was also treated with left lower lobe superior segmentectomy under the care of Dr. Roxan Hockey on April 08, 2018. -She has been followed by Med Onc Dr Earlie Server, under observation.    3. COPD, HTN  -She quit smoking in 2013 after she was  diagnosed with lung cancer.  -She has SOB and uses inhaler daily. Not on oxygen.  -continue to f/u with Pulmonologist Dr Lamonte Sakai.    4. Spinal scoliosis, Left scapular pain  -She notes for the past  3 weeks having intermittent left scapular pain, last episode 3 days ago.  -She has scoliosis, which I suspect may effect this pain.    PLAN:  ***   No problem-specific Assessment & Plan notes found for this encounter.   No orders of the defined types were placed in this encounter.  All questions were answered. The patient knows to call the clinic with any problems, questions or concerns. No barriers to learning was detected. The total time spent in the appointment was {CHL ONC TIME VISIT - YPPJK:[id number]}.     Valerie Mosley 12/06/2020   Oneal Deputy, am acting as scribe for Truitt Merle, MD.   {Add scribe attestation statement}

## 2020-12-07 ENCOUNTER — Other Ambulatory Visit: Payer: Self-pay

## 2020-12-07 ENCOUNTER — Encounter (HOSPITAL_BASED_OUTPATIENT_CLINIC_OR_DEPARTMENT_OTHER): Payer: Self-pay | Admitting: Surgery

## 2020-12-08 ENCOUNTER — Inpatient Hospital Stay: Payer: Medicare Other | Admitting: Hematology

## 2020-12-08 ENCOUNTER — Telehealth: Payer: Self-pay | Admitting: *Deleted

## 2020-12-08 NOTE — Telephone Encounter (Signed)
Called pt, r/s appt with Dr. Burr Medico to 3/29 at 1pm to discuss CT results and further tx. Received verbal understanding.

## 2020-12-08 NOTE — Telephone Encounter (Signed)
Spoke to pt regarding moving CT chest up to 3/28 at 11:40. Confirmed new appt as well as appt to see Dr. Burr Medico.  No further needs voiced at this time.

## 2020-12-11 ENCOUNTER — Ambulatory Visit
Admission: RE | Admit: 2020-12-11 | Discharge: 2020-12-11 | Disposition: A | Discharge status: Home / Self Care | Payer: Medicare Other | Source: Ambulatory Visit | Attending: Emergency Medicine | Admitting: Emergency Medicine

## 2020-12-11 ENCOUNTER — Encounter: Payer: Self-pay | Admitting: *Deleted

## 2020-12-11 ENCOUNTER — Telehealth: Payer: Self-pay | Admitting: *Deleted

## 2020-12-11 ENCOUNTER — Other Ambulatory Visit (HOSPITAL_COMMUNITY)
Admission: RE | Admit: 2020-12-11 | Discharge: 2020-12-11 | Disposition: A | Discharge status: Home / Self Care | Payer: Medicare Other | Source: Ambulatory Visit | Attending: Surgery | Admitting: Surgery

## 2020-12-11 DIAGNOSIS — Z20822 Contact with and (suspected) exposure to covid-19: Secondary | ICD-10-CM | POA: Insufficient documentation

## 2020-12-11 DIAGNOSIS — J948 Other specified pleural conditions: Secondary | ICD-10-CM | POA: Diagnosis not present

## 2020-12-11 DIAGNOSIS — D492 Neoplasm of unspecified behavior of bone, soft tissue, and skin: Secondary | ICD-10-CM | POA: Diagnosis not present

## 2020-12-11 DIAGNOSIS — Z01812 Encounter for preprocedural laboratory examination: Secondary | ICD-10-CM | POA: Diagnosis not present

## 2020-12-11 DIAGNOSIS — C3490 Malignant neoplasm of unspecified part of unspecified bronchus or lung: Secondary | ICD-10-CM

## 2020-12-11 DIAGNOSIS — J929 Pleural plaque without asbestos: Secondary | ICD-10-CM | POA: Diagnosis not present

## 2020-12-11 LAB — SARS CORONAVIRUS 2 (TAT 6-24 HRS): SARS Coronavirus 2: NEGATIVE

## 2020-12-11 NOTE — Telephone Encounter (Signed)
Confirmed appts for today and as well as future appts. Received verbal understanding on locations and timing. No further needs voiced at this time.

## 2020-12-11 NOTE — Progress Notes (Signed)

## 2020-12-12 ENCOUNTER — Inpatient Hospital Stay: Payer: Medicare Other | Attending: Nurse Practitioner | Admitting: Hematology

## 2020-12-12 ENCOUNTER — Other Ambulatory Visit: Payer: Self-pay

## 2020-12-12 VITALS — BP 128/76 | HR 93 | Temp 97.9°F | Resp 17 | Ht 64.0 in | Wt 118.7 lb

## 2020-12-12 DIAGNOSIS — Z171 Estrogen receptor negative status [ER-]: Secondary | ICD-10-CM | POA: Diagnosis not present

## 2020-12-12 DIAGNOSIS — C3490 Malignant neoplasm of unspecified part of unspecified bronchus or lung: Secondary | ICD-10-CM | POA: Diagnosis not present

## 2020-12-12 DIAGNOSIS — Z87891 Personal history of nicotine dependence: Secondary | ICD-10-CM | POA: Diagnosis not present

## 2020-12-12 DIAGNOSIS — Z85118 Personal history of other malignant neoplasm of bronchus and lung: Secondary | ICD-10-CM | POA: Insufficient documentation

## 2020-12-12 DIAGNOSIS — C50212 Malignant neoplasm of upper-inner quadrant of left female breast: Secondary | ICD-10-CM | POA: Diagnosis not present

## 2020-12-12 MED ORDER — OXYCODONE HCL 5 MG PO TABS: 5.0000 mg | ORAL_TABLET | ORAL | 0 refills | Status: DC | PRN

## 2020-12-12 NOTE — Progress Notes (Signed)
Location of Breast Cancer: Malignant neoplasm of upper-inner quadrant of LEFT breast, estrogen receptor negative   Histology per Pathology Report: 11/21/2020 FINAL MICROSCOPIC DIAGNOSIS:  A. BREAST, LEFT, LUMPECTOMY:  - Invasive ductal carcinoma, grade 3, 1.3 cm  - The anterior resection margin is widely involved by carcinoma  - Carcinoma is 0.2 cm from posterior margin  - Biopsy site changes  - See oncology table  B. BREAST, LEFT ADDITIONAL SUPERIOR MARGIN, EXCISION:  - Unremarkable fibroadipose tissue, negative for carcinoma  C. BREAST, LEFT ADDITIONAL LATERAL MARGIN, EXCISION:  - Unremarkable fibroadipose tissue, negative for carcinoma  D. BREAST, LEFT ADDITIONAL INFEROMEDIAL MARGIN, EXCISION:  - Unremarkable fibroadipose tissue, negative for carcinoma  E. LYMPH NODE, LEFT AXILLARY, SENTINEL, EXCISION:  - Lymph node, negative for carcinoma (0/1)  Receptor Status: ER(Negative), PR (Negative), Her2-neu (Negative via FISH), Ki-67(30%)  Did patient present with symptoms (if so, please note symptoms) or was this found on screening mammography?: Patient palpated a mass herself in mid-January. Her mammogram showed a 1.2cm mass in the 11:00 position of her left breast  Past/Anticipated interventions by surgeon, if any: Was scheduled for re-excision of left breast lumpectomy by Dr. Brantley Stage on 12/14/2020, but per patient that has been put on hold while her oncologists decide how to proceed with her lung issues.  11/21/2020 Dr. Marcello Moores [place] Left breast seed localized lumpectomy with left axillary sentinel lymph node mapping  Past/Anticipated interventions by medical oncology, if any:  Under care of Dr. Truitt Merle  (saw Dr. Burr Medico on 12/12/2020; note pending) 11/08/2020 -Given the early stage disease, she is likely a candidate for lumpectomy with sentinel LN biopsy.  Due to the aggressive nature of triple negative disease, and her overall good health, I do feel SLB is beneficial.  she is  agreeable and will proceed with surgery on 11/21/20 with Dr Brantley Stage. -The risk of recurrence depends on the stage and biology of the tumor. She is early stage, with ER/PR/HER2 negative markers. I discussed this is the more aggressive type of breast cancer  with high risk of recurrence.  -To reduce her risk of recurrence, I recommend adjuvant chemotherapy after surgery. Depends on her final surgical path result, I will determine her chemo regimen. If node negative and tumor less than 2cm, I recommend moderate intensive TC q3weeks for 4 cycles        --Chemotherapy consent: Side effects including but does not limited to, fatigue, nausea, vomiting, diarrhea, hair loss, neuropathy, fluid retention, renal and kidney dysfunction, neutropenic fever, needed for blood transfusion, bleeding, were discussed with patient in great detail. She agrees to proceed with chemo.  -I discussed PAC placement if indicated. She will have chemo education class before start of treatment.  -To reduce her risk of local recurrence, Adjuvant radiation is recommended after lumpectomy to reduce her risk of local recurrence. I will refer her to Dr Isidore Moos after chemotherapy.  -Given the negative ER and PR expression, she does not benefit from antiestrogen therapy, which I do not recommend.  -Proceed with surgery soon. I will f/u with her in 4 weeks to further discuss adjuvant chemotherapy.   Lymphedema issues, if any:  Patient denies    Pain issues, if any:  Patient denies any discomfort to the breast. Does report intermittent left sided shoulder/back pain   SAFETY ISSUES:  Prior radiation? No  Pacemaker/ICD? No  Possible current pregnancy? No--postmenopausal   Is the patient on methotrexate? No  Current Complaints / other details:  Patient has received both Pfizer vaccines  as well as Product/process development scientist. She may see Dr. Curt Bears next week per [facility] (though patient would like to maintain care under Dr.  Burr Medico if feasible)

## 2020-12-12 NOTE — Progress Notes (Addendum)
[place]   [phone] [phone]   Clinic Follow up Note   Patient Care Team: [name], MD as PCP - General (Family Medicine) Collene Gobble, MD (Pulmonary Disease) Erroll Luna, MD as Consulting Physician (General Surgery) Truitt Merle, MD as Consulting Physician (Hematology) 12/12/2020  CHIEF COMPLAINT: f/u CT scan   SUMMARY OF ONCOLOGIC HISTORY: Oncology History Overview Note  Cancer Staging Non-small cell lung cancer [facility]) Staging form: Lung, AJCC 7th Edition - Clinical: Stage IA (Free text: Ia) - Signed by [name], MD on 12/28/2013 Laterality: Left Cancer stage: Ia - Pathologic: Ia - Signed by [name], MD on 12/28/2013 Laterality: Left Cancer stage: Ia    Non-small cell lung cancer ([place])  09/02/2011 Imaging   CT Chest  IMPRESSION:   1.  Interval clearing of right upper lobe pneumonia.  2.  Left upper lobe nodule is unchanged in the short interval from  07/29/2011.  Follow-up could be performed in 3 months to ensure  continued stability. This recommendation follows the consensus  statement: Guidelines for Management of Small Pulmonary Nodules  Detected on CT Scans:  A Statement from the [place] as  published in Radiology 2005; 237:395-400.  Available online at:  [email or web address]  3.  Additional scattered pulmonary nodules can be reevaluated on  future imaging as well.    11/06/2011 Imaging   PET IMPRESSION:   1.  Mild hypermetabolism which projects minimally cephalad to, but  is felt to correspond to the left upper lobe lung nodule.  Although  not within malignant range, given the small size of the nodule,  malignancy cannot be excluded.  Possible mild interval enlargement  of the nodule since 09/02/2011.  Consider repeat standard chest CT  to confirm interval enlargement.  Especially if interval  enlargement has occurred, tissue sampling  would be suggested.  Alternatively, 13-monthfollow-up chest CT could be performed.  2.  No evidence of thoracic nodal or extrathoracic disease.  3. Vague hypermetabolism corresponding to a prominent right lobe of  the thyroid. Nonspecific.  Consider ultrasound correlation.IMPRESSION:   1.  Mild hypermetabolism which projects minimally cephalad to, but  is felt to correspond to the left upper lobe lung nodule.  Although  not within malignant range, given the small size of the nodule,  malignancy cannot be excluded.  Possible mild interval enlargement  of the nodule since 09/02/2011.  Consider repeat standard chest CT  to confirm interval enlargement.  Especially if interval  enlargement has occurred, tissue sampling would be suggested.  Alternatively, 351-monthollow-up chest CT could be performed.  2.  No evidence of thoracic nodal or extrathoracic disease.  3. Vague hypermetabolism corresponding to a prominent right lobe of  the thyroid. Nonspecific.  Consider ultrasound correlation.   12/17/2011 Surgery   Thoracoscopy by Dr [name] Diagnosis 1. Lung, wedge biopsy/resection, left upper lobe - INVASIVE WELL-DIFFERENTIATED ADENOCARCINOMA, SPANNING 1.2 CM. - NO LYMPH VASCULAR INVASION IDENTIFIED. - PLEURA IS UNINVOLVED. - MARGINS ARE NEGATIVE. - SEE ONCOLOGY TEMPLATE. 2. Lymph node, biopsy, level 9 - ONE BENIGN LYMPH NODE WITH NO TUMOR SEEN (0/1). 3. Lymph node, biopsy, level 9 - ONE BENIGN LYMPH NODE WITH NO TUMOR SEEN (0/1). 4. Lymph node, biopsy, 11 - ONE BENIGN LYMPH NODE WITH NO TUMOR SEEN (0/1). 5. Lymph node, biopsy, 11 #2 - ONE BENIGN LYMPH NODE WITH NO TUMOR SEEN (0/1). 6. Lymph node, biopsy, 5 - ONE BENIGN LYMPH NODE WITH NO TUMOR SEEN (0/1). 7.  Lymph node, biopsy, 10 - ONE BENIGN LYMPH NODE WITH NO TUMOR SEEN (0/1). 8. Lymph node, biopsy, 11 #3 - ONE BENIGN LYMPH NODE WITH NO TUMOR SEEN (0/1). 9. Lung, resection (segmental or lobe), Remainder of left upper  lobe - BENIGN LUNG PARENCHYMA WITH CHRONIC INFLAMMATION AND INCREASED PULMONARY MACROPHAGES. - THREE BENIGN LYMPH NODES IDENTIFIED (0/3). - NO TUMOR SEEN.    01/08/2012 Initial Diagnosis   Non-small cell lung cancer ([place])   02/10/2018 Imaging   CT Chest IMPRESSION: 1. Enlarging irregular pleural-based solid 1.9 cm pulmonary nodule in the superior segment left lower lobe, suspicious for malignancy, either recurrent disease or a metachronous primary bronchogenic carcinoma. Consider PET-CT for further characterization. 2. Additional scattered bilateral pulmonary nodules are stable and considered benign. 3. No noncontrast CT evidence of thoracic adenopathy. 4. Three-vessel coronary atherosclerosis. 5. Nonobstructing left nephrolithiasis.   Aortic Atherosclerosis (ICD10-I70.0) and Emphysema (ICD10-J43.9).   02/18/2018 PET scan   IMPRESSION: 1. Hypermetabolic apical nodule in the superior segment left lower lobe is highly worrisome for bronchogenic carcinoma. 2. No additional areas of abnormal hypermetabolism in the neck, chest, abdomen or pelvis. 3. Aortic atherosclerosis (ICD10-170.0). Coronary artery calcification. 4.  Emphysema (ICD10-J43.9). 5. Left renal stone.     03/06/2018 Pathology Results   Lung Biopsy  Diagnosis Lung, needle/core biopsy(ies), Left Lower Lobe - ADENOCARCINOMA. Microscopic Comment There is likely sufficient tissue for additional studies if requested (block 1B). Dr. Lyndon Code has reviewed the case.   04/08/2018 Surgery   Left Video assisted Thoracoscopy by Dr Roxan Hockey  Diagnosis Lung, wedge biopsy/resection, Left Lower Lobe - INVASIVE ADENOCARCINOMA, MODERATELY DIFFERENTIATED, SPANNING 1.3 CM. - ADENOCARCINOMA INVOLVES VISCERAL PLEURA. - ONE BENIGN INTRAPULMONARY LYMPH NODE (0/1). - LYMPHOVASCULAR INVASION IS IDENTIFIED, FOCAL. - THE SURGICAL RESECTION MARGINS ARE NEGATIVE FOR CARCINOMA. - SEE ONCOLOGY TABLE BELOW.    08/01/2020 Imaging   CT  Chest  IMPRESSION: Stable postop changes from prior left upper lobectomy. Stable small bilateral pulmonary nodules. No new or progressive disease within the thorax.   Aortic Atherosclerosis (ICD10-I70.0) and Emphysema (ICD10-J43.9).   Malignant neoplasm of upper-inner quadrant of left breast in female, estrogen receptor negative ([place])  10/18/2020 Mammogram   Mammogram 10/18/20 at [facility] FINDINGS:  Cc and MLO views of bilateral breasts are submitted. There is a  spiculated mass in the palpable area upper left breast. The right  breast is negative.   Targeted ultrasound is performed, showing 1.2 x 0.9 x 1.2 cm  spiculated hypoechoic mass at the left breast 11 o'clock 8 cm from  nipple palpable area. This correlates to the mammographic mass.  Ultrasound of the left axilla is negative.   IMPRESSION:  Highly suspicious findings.     10/26/2020 Initial Biopsy   Final Pathologic Diagnosis  10/26/20 at [facility] BREAST, LEFT 1:00 8 CM FROM NIPPLE, NEEDLE BIOPSY:              INVASIVE DUCTAL CARCINOMA, NOTTINGHAM GRADE 3. Comment   The Nottingham grade is 3 (3-tubule formation, 3-nuclear atypia, 3-mitoses).  This case was reviewed by Dr. Vicenta Dunning and she is in essential agreement with the above diagnosis.  Final Pathologic Diagnosis   Prognostic Markers in Cancer   Block Number:  [id number] Diagnosis:  Invasive ductal carcinoma     Results:   Estrogen Receptor:  Negative (<1%)   Progesterone Receptor:  Negative (<1%)   Her2:  Negative (score=1+)     Ki-67:  Percentage of tumor cells with nuclear positivity: 60 %      11/01/2020  Cancer Staging   Staging form: Breast, AJCC 8th Edition - Clinical stage from 11/01/2020: Stage IB (cT1c, cN0, cM0, G3, ER-, PR-, HER2-) - Signed by Truitt Merle, MD on 11/08/2020 Stage prefix: Initial diagnosis   11/08/2020 Initial Diagnosis   Malignant neoplasm of upper-inner quadrant of left breast in female, estrogen receptor negative ([place])   11/21/2020  Surgery   LEFT BREAST LUMPECTOMY WITH RADIOACTIVE SEED AND LEFT SENTINEL LYMPH NODE [place] and PAC placement by Dr Brantley Stage    11/21/2020 Pathology Results   FINAL MICROSCOPIC DIAGNOSIS:   A. BREAST, LEFT, LUMPECTOMY:  - Invasive ductal carcinoma, grade 3, 1.3 cm  - The anterior resection margin is widely involved by carcinoma  - Carcinoma is 0.2 cm from posterior margin  - Biopsy site changes  - See oncology table   B. BREAST, LEFT ADDITIONAL SUPERIOR MARGIN, EXCISION:  - Unremarkable fibroadipose tissue, negative for carcinoma   C. BREAST, LEFT ADDITIONAL LATERAL MARGIN, EXCISION:  - Unremarkable fibroadipose tissue, negative for carcinoma   D. BREAST, LEFT ADDITIONAL INFEROMEDIAL MARGIN, EXCISION:  - Unremarkable fibroadipose tissue, negative for carcinoma   E. LYMPH NODE, LEFT AXILLARY, SENTINEL, EXCISION:  - Lymph node, negative for carcinoma (0/1)      CURRENT THERAPY: pending   INTERVAL HISTORY: Ms Yardley returns for follow up after her breast surgery.  She also had a CT chest done yesterday, and she is here to review the results. She came in with her female friend from work.  New left shoulder bladder pain 6/10, persistent, sometime wakes her up at night, she has tried ice bag and heating pad and it helps sometime, this has been going on for a few months.  Cough cheonic no change, dyspnea is slightly worse, she can walk for a few hundreds meter, she lives with her husband and able to fucntion at home  appetei is Lucio Edward, lost about 5lbs in past 3 months  All other systems were reviewed with the patient and are negative.  MEDICAL HISTORY:  Past Medical History:  Diagnosis Date  . Breast cancer ([place])   . Cancer ([place])    Stage IA non-small cell lung cancer, left upper lobectomy 12/2011  . COPD (chronic obstructive pulmonary disease) ([place])   . Cough 07/29/11   started coughing up blood this am  . Cough   . Hypertension   . Kyphoscoliosis   . Pneumonia   . PONV  (postoperative nausea and vomiting)   . Recurrent upper respiratory infection (URI)     SURGICAL HISTORY: Past Surgical History:  Procedure Laterality Date  . BREAST LUMPECTOMY WITH RADIOACTIVE SEED AND SENTINEL LYMPH NODE BIOPSY Left 11/21/2020   Procedure: LEFT BREAST LUMPECTOMY WITH RADIOACTIVE SEED AND LEFT SENTINEL LYMPH NODE [place];  Surgeon: Erroll Luna, MD;  Location: [place];  Service: General;  Laterality: Left;  PEC BLOCK; START TIME OF 11:00 AM FOR 90 MINUTES IN ROOM 2  . BRONCHOSCOPY    . CATARACT EXTRACTION, BILATERAL  2016  . EYE SURGERY    . INSERTION OF IBV VALVE Left 04/16/2018   Procedure: INSERTION OF INTERBRONCHIAL VALVE (IBV);  Surgeon: [name], MD;  Location: [place];  Service: Thoracic;  Laterality: Left;  . INSERTION OF IBV VALVE N/A 06/25/2018   Procedure: REMOVAL OF THREE INTERBRONCHIAL VALVE (IBV);  Surgeon: [name], MD;  Location: [place];  Service: Thoracic;  Laterality: N/A;  . LOBECTOMY  12/17/2011   Procedure: LOBECTOMY;  Surgeon: [name], MD;  Location: [place];  Service: Thoracic;  Laterality: Left;  (L)VATS, WEDGE RESECTION, LEFT UPPER LOBECTOMY,  Multiple node biopsies  . PORTACATH PLACEMENT Left 11/21/2020   Procedure: INSERTION PORT-A-CATH;  Surgeon: Erroll Luna, MD;  Location: [place];  Service: General;  Laterality: Left;  . REMOVAL OF PLEURAL DRAINAGE CATHETER Left 05/06/2018   Procedure: REMOVAL OF CHEST TUBE;  Surgeon: [name], MD;  Location: [place];  Service: Thoracic;  Laterality: Left;  . THYROID SURGERY  1980   1/2 thyroid removed - benign  . URETHRA SURGERY    . VIDEO ASSISTED THORACOSCOPY Left 04/08/2018   Procedure: REDO LEFT VIDEO ASSISTED THORACOSCOPY with left lower lobe wedge resection;  Surgeon: [name], MD;  Location: [place];  Service: Thoracic;  Laterality: Left;  Marland Kitchen VIDEO BRONCHOSCOPY N/A 04/16/2018   Procedure: VIDEO BRONCHOSCOPY;  Surgeon: [name], MD;  Location:  [place];  Service: Thoracic;  Laterality: N/A;  . VIDEO BRONCHOSCOPY N/A 06/25/2018   Procedure: VIDEO BRONCHOSCOPY;  Surgeon: [name], MD;  Location: [facility];  Service: Thoracic;  Laterality: N/A;    I have reviewed the social history and family history with the patient and they are unchanged from previous note.  ALLERGIES:  has No Known Allergies.  MEDICATIONS:  Current Outpatient Medications  Medication Sig Dispense Refill  . acetaminophen (TYLENOL) 500 MG tablet Take 1,000 mg by mouth every 6 (six) hours as needed for moderate pain or headache.    Marland Kitchen acyclovir ointment (ZOVIRAX) 5 % Apply 1 application topically every 3 (three) hours as needed. (Patient taking differently: Apply 1 application topically every 3 (three) hours as needed (cold sores).) 15 g 0  . albuterol (VENTOLIN HFA) 108 (90 Base) MCG/ACT inhaler Inhale 2 puffs into the lungs every 4 (four) hours as needed for wheezing or shortness of breath. For shortness of breath 8.51 g 5  . amLODipine (NORVASC) 10 MG tablet Take 10 mg by mouth daily.    Marland Kitchen BEVESPI AEROSPHERE 9-4.8 MCG/ACT AERO INHALE 2 PUFFS INTO THE LUNGS 2 TIMES DAILY. (Patient taking differently: Inhale 2 puffs into the lungs in the morning and at bedtime.) 10.7 g 11  . colestipol (COLESTID) 1 g tablet Take 1 g by mouth daily.    . fluticasone (FLONASE) 50 MCG/ACT nasal spray Place 2 sprays into both nostrils daily as needed for allergies. 16 g 2  . HYDROcodone-acetaminophen (NORCO/VICODIN) 5-325 MG tablet Take 1 tablet by mouth every 6 (six) hours as needed for moderate pain. 15 tablet 0  . montelukast (SINGULAIR) 10 MG tablet Take 1 tablet (10 mg total) by mouth at bedtime. 30 tablet 5  . sertraline (ZOLOFT) 50 MG tablet Take 50 mg by mouth at bedtime.     No current facility-administered medications for this visit.    PHYSICAL EXAMINATION: ECOG PERFORMANCE STATUS: 2 - Symptomatic, <50% confined to bed  Vitals:   12/12/20 1257  BP: 128/76  Pulse:  93  Resp: 17  Temp: 97.9 F (36.6 C)  SpO2: 93%   Filed Weights   12/12/20 1257  Weight: 118 lb 11.2 oz (53.8 kg)    GENERAL:alert, no distress and comfortable SKIN: skin color, texture, turgor are normal, no rashes or significant lesions EYES: normal, Conjunctiva are pink and non-injected, sclera clear OROPHARYNX:no exudate, no erythema and lips, buccal mucosa, and tongue normal  NECK: supple, thyroid normal size, non-tender, without nodularity LYMPH:  no palpable lymphadenopathy in the cervical, axillary or inguinal LUNGS: clear to auscultation and percussion with normal breathing  effort HEART: regular rate & rhythm and no murmurs and no lower extremity edema ABDOMEN:abdomen soft, non-tender and normal bowel sounds Musculoskeletal:no cyanosis of digits and no clubbing  NEURO: alert & oriented x 3 with fluent speech, no focal motor/sensory deficits  LABORATORY DATA:  I have reviewed the data as listed CBC Latest Ref Rng & Units 06/14/2019 11/30/2018 06/17/2018  WBC 4.0 - 10.5 K/uL 7.0 9.1 8.6  Hemoglobin 12.0 - 15.0 g/dL 15.8(H) 15.0 13.7  Hematocrit 36.0 - 46.0 % 46.9(H) 45.9 42.5  Platelets 150 - 400 K/uL 290 312 407(H)     CMP Latest Ref Rng & Units 06/14/2019 11/30/2018 06/17/2018  Glucose 70 - 99 mg/dL 85 94 90  BUN 8 - 23 mg/dL 10 19 19   Creatinine 0.44 - 1.00 mg/dL 0.69 1.02(H) 0.60  Sodium 135 - 145 mmol/L 137 139 136  Potassium 3.5 - 5.1 mmol/L 3.5 4.5 3.6  Chloride 98 - 111 mmol/L 102 106 105  CO2 22 - 32 mmol/L 23 23 23   Calcium 8.9 - 10.3 mg/dL 8.6(L) 8.8(L) 9.0  Total Protein 6.5 - 8.1 g/dL 7.0 7.2 6.6  Total Bilirubin 0.3 - 1.2 mg/dL 0.6 0.3 0.4  Alkaline Phos 38 - 126 U/L 122 130(H) 108  AST 15 - 41 U/L 18 15 20   ALT 0 - 44 U/L 14 15 14       RADIOGRAPHIC STUDIES: I have personally reviewed the radiological images as listed and agreed with the findings in the report. CT Chest Wo Contrast  Result Date: 12/11/2020 CLINICAL DATA:  Restaging Lung cancer  EXAM: CT CHEST WITHOUT CONTRAST TECHNIQUE: Multidetector CT imaging of the chest was performed following the standard protocol without IV contrast. COMPARISON:  08/01/2020 FINDINGS: Cardiovascular: The heart size appears within normal limits. Trace pericardial effusion/thickening, unchanged. Aortic atherosclerosis. Coronary artery calcifications. Mediastinum/Nodes: No axillary adenopathy. Left supraclavicular lymph node is new from the previous exam measuring 2.4 x 2.3 cm, image 19/2. Anterior mediastinal lymph node is new from previous exam measuring 1.3 cm, image 57/2. Lungs/Pleura: Advanced changes of paraseptal and centrilobular emphysema. Signs of tumor recurrence within the apical portion of the left hemithorax with large rind of increased soft tissue measuring 7.4 x 4.8 by 5.9 cm, image 15/2. Tumor extends into the left upper thoracic spine paravertebral soft tissues, image 22/2. Signs of pleural metastases is noted with rind overlying the lateral and posterior left lower lung, image 46/2 and image 86/2. New interstitial thickening is identified within the left upper lung, which in the absence of interval external beam radiation is concerning for lymphangitic spread of disease, image 30/3. Progressive pleural based mass within the paravertebral right upper lobe measures 4.4 x 1.7 by 5.2 cm, image 39/3. Previously this measured 1.6 by 0.6 by 2.0 cm. Upper Abdomen: No acute abnormality within the imaged portions of the upper abdomen. Indeterminate focus of increased soft tissue between the upper pole of left kidney and the right adrenal gland measures 1.7 x 2.1 cm, image 127/2. Aortic atherosclerosis. Scattered liver cysts. Musculoskeletal: No acute or suspicious osseous findings. Marked upper thoracic spine kyphosis. IMPRESSION: 1. Signs of tumor recurrence within the apical portion of the left hemithorax with large rind of increased soft tissue. Tumor extends into the left upper thoracic spine paravertebral  soft tissues. 2. Pleural spread of disease is noted with mild progressive pleural thickening overlying the posterior and lateral left lower lung. 3. Interval development of increased interstitial markings within the left upper lobe adjacent to mass. In the absence of  interval external beam radiation (since 08/01/2020) this is suspicious for lymphangitic spread of tumor. 4. Progressive soft tissue mass the within the paravertebral right upper lobe concerning for metastatic disease. 5. New enlarged anterior mediastinal and left supraclavicular lymph nodes compatible with metastatic adenopathy. 6. Indeterminate soft tissue attenuating nodular density between the upper pole of left kidney and left adrenal gland. Cannot exclude nodal metastasis or adrenal gland metastasis. 7. Aortic atherosclerosis. Aortic Atherosclerosis (ICD10-I70.0). Electronically Signed   By: Kerby Moors M.D.   On: 12/11/2020 15:00     ASSESSMENT & PLAN:  70 yo female   1. Left lung mass, pleural nodule and adenopathy  -I personally reviewed her CT chest from yesterday, which reviewed a large soft tissue mass in the left apical portion of the lung, with direct invasion of the left upper thoracic spine, probable pleural metastasis and lymph edetic metastasis in left lung, enlarged anterior mediastinal and left supraclavicular lymph nodes, compatible with metastatic disease. -This is likely recurrent metastatic lung cancer, with high tumor burden. -I recommend ultrasound-guided left supraclavicular lymph node biopsy for tissue diagnosis, and molecular testing -I will ask pathology to compare her breast tumor and the [place] node biopsy to see if they are same or different tumor  -I recommend PET scan to complete staging -If biopsy confirmed lung cancer, will get a brain MRI also. -Plan to send her biopsies tissue for genomic testing [facility] and PD-L1 testing to see if she is a candidate for targeted therapy and or immunotherapy. -I  will see her back after PET scan and biopsy -His left shoulder blade pain is likely related to tumor invading spine, I spoke with Dr. Isidore Moos to see if she benefited from IR course of palliative radiation.  2.  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 She palpated her breast mass herself in mid January. Her mammogram showed a 1.2cm mass in the 11:00 position of her left breast. Biopsy showed invasive ductal carcinoma, triple negative -She underwent left breast lumpectomy and sentinel lymph node biopsy on 3/8.  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  The anterior resection margin was positive. -She is scheduled for re-excision on 3/31. I spoke with Dr. Brantley Stage today, giving the CT finding is highly suspicious for metastatic cancer, he will cancel the surgery  3. History of LUL stage IA, T1 a, N0, M0) non-small cell lung cancer, adenocarcinoma Dx in 2013 + LLL stage Ib (T2, and 0, M0) non-small cell lung cancer, adenocarcinoma Dx 02/2018.  4. COPD and HTN  -She quit smoking in 2013 after she was diagnosed with lung cancer.  -She has SOB and uses inhaler daily. Not on oxygen.   Plan -PET scan in 1-2 weeks -IR left [place] node biopsy ASAP -will see her back after PET and biopsy  -possible palliative RT to left paraspinal mass   Orders Placed This Encounter  Procedures  . NM PET Image Restag (PS) Skull Base To Thigh    Standing Status:   Future    Standing Expiration Date:   12/12/2021    Order Specific Question:   If indicated for the ordered procedure, I authorize the administration of a radiopharmaceutical per Radiology protocol    Answer:   Yes    Order Specific Question:   Preferred imaging location?    Answer:  [facility]  . [place] CORE BIOPSY (LYMPH NODES)    Standing  Status:   Future    Standing Expiration Date:   12/13/2021    Scheduling Instructions:     Please schedule the biopsy ASAP, thanks    Order Specific Question:   Lab orders requested (DO NOT place separate lab orders, these will be automatically ordered during procedure specimen collection):    Answer:   Surgical Pathology    Order Specific Question:   Reason for Exam (SYMPTOM  OR DIAGNOSIS REQUIRED)    Answer:   probably recurrence of lung cancer    Order Specific Question:   Preferred location?    Answer:   [facility]   All questions were answered. The patient knows to call the clinic with any problems, questions or concerns. No barriers to learning was detected. I spent 40 minutes counseling the patient face to face. The total time spent in the appointment was 50 minutes and more than 50% was on counseling and review of test results     Truitt Merle, MD 12/12/20

## 2020-12-13 ENCOUNTER — Encounter: Payer: Self-pay | Admitting: *Deleted

## 2020-12-13 ENCOUNTER — Ambulatory Visit
Admission: RE | Admit: 2020-12-13 | Discharge: 2020-12-13 | Disposition: A | Discharge status: Home / Self Care | Payer: Medicare Other | Source: Ambulatory Visit | Attending: Radiation Oncology | Admitting: Radiation Oncology

## 2020-12-13 ENCOUNTER — Encounter: Payer: Self-pay | Admitting: Radiation Oncology

## 2020-12-13 ENCOUNTER — Encounter: Payer: Self-pay | Admitting: Hematology

## 2020-12-13 ENCOUNTER — Telehealth: Payer: Self-pay | Admitting: Hematology

## 2020-12-13 VITALS — BP 118/81 | HR 80 | Temp 97.1°F | Resp 18 | Ht 64.0 in | Wt 119.2 lb

## 2020-12-13 DIAGNOSIS — Z171 Estrogen receptor negative status [ER-]: Secondary | ICD-10-CM | POA: Diagnosis not present

## 2020-12-13 DIAGNOSIS — I700 Atherosclerosis of aorta: Secondary | ICD-10-CM | POA: Diagnosis not present

## 2020-12-13 DIAGNOSIS — Z79899 Other long term (current) drug therapy: Secondary | ICD-10-CM | POA: Diagnosis not present

## 2020-12-13 DIAGNOSIS — C50212 Malignant neoplasm of upper-inner quadrant of left female breast: Secondary | ICD-10-CM | POA: Insufficient documentation

## 2020-12-13 DIAGNOSIS — K7689 Other specified diseases of liver: Secondary | ICD-10-CM | POA: Diagnosis not present

## 2020-12-13 DIAGNOSIS — J432 Centrilobular emphysema: Secondary | ICD-10-CM | POA: Insufficient documentation

## 2020-12-13 DIAGNOSIS — R918 Other nonspecific abnormal finding of lung field: Secondary | ICD-10-CM | POA: Diagnosis not present

## 2020-12-13 DIAGNOSIS — C3490 Malignant neoplasm of unspecified part of unspecified bronchus or lung: Secondary | ICD-10-CM

## 2020-12-13 DIAGNOSIS — Z87891 Personal history of nicotine dependence: Secondary | ICD-10-CM | POA: Insufficient documentation

## 2020-12-13 DIAGNOSIS — Z923 Personal history of irradiation: Secondary | ICD-10-CM | POA: Insufficient documentation

## 2020-12-13 DIAGNOSIS — I10 Essential (primary) hypertension: Secondary | ICD-10-CM | POA: Insufficient documentation

## 2020-12-13 DIAGNOSIS — C3412 Malignant neoplasm of upper lobe, left bronchus or lung: Secondary | ICD-10-CM

## 2020-12-13 NOTE — Progress Notes (Signed)
I contacted auth coordinator to expidite insurance auth for PET scan.

## 2020-12-13 NOTE — Telephone Encounter (Signed)
Checked out appointment. No LOS notes needing to be scheduled. No changes made. 

## 2020-12-13 NOTE — Progress Notes (Signed)
I received a message that PET scan is in review with insurance. Not scheduled at this time.

## 2020-12-13 NOTE — Progress Notes (Signed)
Radiation Oncology         [phone] ________________________________  Initial Outpatient Consultation  Name: Valerie Mosley MRN: [id number]  Date: 12/13/2020  DOB: 1950/09/26  WU:[id number], Hal Hope, MD  Truitt Merle, MD   REFERRING PHYSICIAN: Truitt Merle, MD  DIAGNOSIS:    ICD-10-CM   1. Malignant neoplasm of upper-inner quadrant of left breast in female, estrogen receptor negative ([place])  C50.212    Z17.1   2. Primary cancer of left upper lobe of lung ([place])  C34.12     Cancer Staging Malignant neoplasm of upper-inner quadrant of left breast in female, estrogen receptor negative ([place]) Staging form: Breast, AJCC 8th Edition - Clinical stage from 11/01/2020: Stage IB (cT1c, cN0, cM0, G3, ER-, PR-, HER2-) - Signed by Truitt Merle, MD on 11/08/2020 Stage prefix: Initial diagnosis  Non-small cell lung cancer ([place]) Staging form: Lung, AJCC 7th Edition - Clinical: Stage IA (Free text: Ia) - Signed by [name], MD on 12/28/2013 Laterality: Left Cancer stage: Ia - Pathologic: Ia - Signed by [name], MD on 12/28/2013 Laterality: Left Cancer stage: Ia  Now with Stage IV disease involved pleural mass (left upper chest) and SCV (left) adenopathy, histology pending  CHIEF COMPLAINT: Here to discuss management of left breast cancer  HISTORY OF PRESENT ILLNESS::Valerie Mosley is a 70 y.o. female who presented with left breast abnormality on the following imaging: diagnostic bilateral mammogram on the date of 10/18/2020. Symptoms, if any, at that time, were: palpable left breast mass. Ultrasound of the left breast on 10/18/2020 revealed a 1.2 x 0.9 x 1.2 cm spiculated hypoechoic mass at the left breast at the 11 o'clock position that was highly suspicious. Left axilla was negative. Biopsy on the date of 10/26/2020 showed invasive ductal carcinoma. ER status: 0% negative; PR status: 0% negative; Her2 status: negative; Grade: 3.  Breast/nodal surgery (lumpectomy, SLN) on the  date of 11/21/2020 revealed: invasive ductal carcinoma spanning 1.3 cm. The anterior resection margin was widely involved by carcinoma. Carcinoma was 0.2 cm from the posterior margin. Nodal status of negative (0/1). ER status: 0% negative; PR status: 0% negative; Her2 status: negative; Grade: 3.  She also had a CT chest wo cont done this week and reviewed the resulted with Dr Burr Medico.  It shows signs of tumor recurrence within the apical portion of the left hemithorax with large rind of increased soft tissue. Tumor extends into the left upper thoracic spine paravertebral soft tissues.  Pleural spread of disease is noted with mild progressive pleural thickening overlying the posterior and lateral left lower lung. Interval development of increased interstitial markings within the left upper lobe adjacent to mass. In the absence of interval external beam radiation (since 08/01/2020) this is suspicious for lymphangitic spread of tumor. Progressive soft tissue mass the within the paravertebral right upper lobe concerning for metastatic disease.  New enlarged anterior mediastinal and left supraclavicular lymph nodes compatible with metastatic adenopathy.  I have reviewed her imaging and discussed her w Dr Burr Medico  She came in with her female friend from work. She states months of left scapular pain 6/10, persistent, sometime wakes her up at night, she has tried ice bag and heating pad and it helps sometimes. She has a history of LUL stage IA, T1 a, N0, M0) non-small cell lung cancer, adenocarcinoma Dx in 2013 + LLL stage Ib (T2, and 0, M0) non-small cell lung cancer, adenocarcinoma Dx 02/2018. Surgically removed x 2.  Pending SCV node biopsy.  Pending PET.  SAFETY ISSUES:  Prior radiation? No  Pacemaker/ICD? No  Possible current pregnancy? No--postmenopausal   Is the patient on methotrexate? No  Current Complaints / other details:  Patient has received both Pfizer vaccines as well as Product/process development scientist.    PREVIOUS RADIATION THERAPY: No  PAST MEDICAL HISTORY:  has a past medical history of Breast cancer ([place]), Cancer ([place]), COPD (chronic obstructive pulmonary disease) ([place]), Cough (07/29/11), Cough, Hypertension, Kyphoscoliosis, Pneumonia, PONV (postoperative nausea and vomiting), and Recurrent upper respiratory infection (URI).    PAST SURGICAL HISTORY: Past Surgical History:  Procedure Laterality Date  . BREAST LUMPECTOMY WITH RADIOACTIVE SEED AND SENTINEL LYMPH NODE BIOPSY Left 11/21/2020   Procedure: LEFT BREAST LUMPECTOMY WITH RADIOACTIVE SEED AND LEFT SENTINEL LYMPH NODE [place];  Surgeon: Erroll Luna, MD;  Location: [facility];  Service: General;  Laterality: Left;  PEC BLOCK; START TIME OF 11:00 AM FOR 90 MINUTES IN ROOM 2  . BRONCHOSCOPY    . CATARACT EXTRACTION, BILATERAL  2016  . EYE SURGERY    . INSERTION OF IBV VALVE Left 04/16/2018   Procedure: INSERTION OF INTERBRONCHIAL VALVE (IBV);  Surgeon: [name], MD;  Location: [place];  Service: Thoracic;  Laterality: Left;  . INSERTION OF IBV VALVE N/A 06/25/2018   Procedure: REMOVAL OF THREE INTERBRONCHIAL VALVE (IBV);  Surgeon: [name], MD;  Location: [place];  Service: Thoracic;  Laterality: N/A;  . LOBECTOMY  12/17/2011   Procedure: LOBECTOMY;  Surgeon: [name], MD;  Location: [place];  Service: Thoracic;  Laterality: Left;  (L)VATS, WEDGE RESECTION, LEFT UPPER LOBECTOMY,  Multiple node biopsies  . PORTACATH PLACEMENT Left 11/21/2020   Procedure: INSERTION PORT-A-CATH;  Surgeon: Erroll Luna, MD;  Location: [place];  Service: General;  Laterality: Left;  . REMOVAL OF PLEURAL DRAINAGE CATHETER Left 05/06/2018   Procedure: REMOVAL OF CHEST TUBE;  Surgeon: [name], MD;  Location: [place] [facility] [place];  Service: Thoracic;  Laterality: Left;  . THYROID SURGERY  1980   1/2 thyroid removed - benign  . URETHRA SURGERY    . VIDEO ASSISTED THORACOSCOPY Left 04/08/2018   Procedure: REDO LEFT VIDEO ASSISTED  THORACOSCOPY with left lower lobe wedge resection;  Surgeon: [name], MD;  Location: [place];  Service: Thoracic;  Laterality: Left;  Marland Kitchen VIDEO BRONCHOSCOPY N/A 04/16/2018   Procedure: VIDEO BRONCHOSCOPY;  Surgeon: [name], MD;  Location: [place];  Service: Thoracic;  Laterality: N/A;  . VIDEO BRONCHOSCOPY N/A 06/25/2018   Procedure: VIDEO BRONCHOSCOPY;  Surgeon: [name], MD;  [facility];  Service: Thoracic;  Laterality: N/A;    FAMILY HISTORY: family history includes Cancer in her cousin and cousin; Cancer (age of onset: 42) in her brother; Cancer (age of onset: 47) in her father.  SOCIAL HISTORY:  reports that she quit smoking about 9 years ago. Her smoking use included cigarettes. She has a 20.00 pack-year smoking history. She has never used smokeless tobacco. She reports current alcohol use of about 14.0 standard drinks of alcohol per week. She reports that she does not use drugs.  ALLERGIES: Patient has no known allergies.  MEDICATIONS:  Current Outpatient Medications  Medication Sig Dispense Refill  . acetaminophen (TYLENOL) 500 MG tablet Take 1,000 mg by mouth every 6 (six) hours as needed for moderate pain or headache.    Marland Kitchen acyclovir ointment (ZOVIRAX) 5 % Apply 1 application topically every 3 (three) hours as needed. (Patient taking differently: Apply 1 application topically every 3 (three) hours as  needed (cold sores).) 15 g 0  . albuterol (VENTOLIN HFA) 108 (90 Base) MCG/ACT inhaler Inhale 2 puffs into the lungs every 4 (four) hours as needed for wheezing or shortness of breath. For shortness of breath 8.51 g 5  . amLODipine (NORVASC) 10 MG tablet Take 10 mg by mouth daily.    Marland Kitchen BEVESPI AEROSPHERE 9-4.8 MCG/ACT AERO INHALE 2 PUFFS INTO THE LUNGS 2 TIMES DAILY. (Patient taking differently: Inhale 2 puffs into the lungs in the morning and at bedtime.) 10.7 g 11  . colestipol (COLESTID) 1 g tablet Take 1 g by mouth daily.    . fluticasone (FLONASE)  50 MCG/ACT nasal spray Place 2 sprays into both nostrils daily as needed for allergies. 16 g 2  . HYDROcodone-acetaminophen (NORCO/VICODIN) 5-325 MG tablet Take 1 tablet by mouth every 6 (six) hours as needed for moderate pain. 15 tablet 0  . montelukast (SINGULAIR) 10 MG tablet Take 1 tablet (10 mg total) by mouth at bedtime. 30 tablet 5  . oxyCODONE (OXY IR/ROXICODONE) 5 MG immediate release tablet Take 1 tablet (5 mg total) by mouth every 4 (four) hours as needed for severe pain. 30 tablet 0  . sertraline (ZOLOFT) 50 MG tablet Take 50 mg by mouth at bedtime.     No current facility-administered medications for this encounter.    REVIEW OF SYSTEMS: A 10+ POINT REVIEW OF SYSTEMS WAS OBTAINED including neurology, dermatology, psychiatry, cardiac, respiratory, lymph, extremities, GI, GU, Musculoskeletal, constitutional, breasts, reproductive, HEENT.  All pertinent positives are noted in the HPI.  All others are negative.   PHYSICAL EXAM:  height is 5' 4"  (1.626 m) and weight is 119 lb 4 oz (54.1 kg). Her temporal temperature is 97.1 F (36.2 C) (abnormal). Her blood pressure is 118/81 and her pulse is 80. Her respiration is 18 and oxygen saturation is 95%.   General: Alert and oriented, in no acute distress, thin HEENT: Head is normocephalic. Extraocular movements are intact. Oropharynx is clear. She is concerned about a mass in the area of the left mastoid tip.  No obvious mass palpated, though there may be a slightly enlarged node there. Neck:  subcutaneous 1 cm nodule in Left SCV region, 1.5-2cm palpable L SCV node more medially Heart: Regular in rate and rhythm with no murmurs, rubs, or gallops. Chest: Clear to auscultation bilaterally, with no rhonchi, wheezes, or rales. Abdomen: Soft, nontender, nondistended, with no rigidity or guarding. Extremities: No cyanosis or edema. Psychiatric: Judgment and insight are intact. Affect is appropriate. MSK: No tenderness to palpation of spine or  shoulders.  Tender in L scapular region.  ECOG = 1  0 - Asymptomatic (Fully active, able to carry on all predisease activities without restriction)  1 - Symptomatic but completely ambulatory (Restricted in physically strenuous activity but ambulatory and able to carry out work of a light or sedentary nature. For example, light housework, office work)  2 - Symptomatic, <50% in bed during the day (Ambulatory and capable of all self care but unable to carry out any work activities. Up and about more than 50% of waking hours)  3 - Symptomatic, >50% in bed, but not bedbound (Capable of only limited self-care, confined to bed or chair 50% or more of waking hours)  4 - Bedbound (Completely disabled. Cannot carry on any self-care. Totally confined to bed or chair)  5 - Death   Eustace Pen MM, Creech RH, Tormey DC, et al. [phone]). "Toxicity and response criteria of the [facility]".  [place] Oncol. 5 (6): 649-55   LABORATORY DATA:  Lab Results  Component Value Date   WBC 7.0 06/14/2019   HGB 15.8 (H) 06/14/2019   HCT 46.9 (H) 06/14/2019   MCV 94.2 06/14/2019   PLT 290 06/14/2019   CMP     Component Value Date/Time   NA 137 06/14/2019 1158   K 3.5 06/14/2019 1158   CL 102 06/14/2019 1158   CO2 23 06/14/2019 1158   GLUCOSE 85 06/14/2019 1158   BUN 10 06/14/2019 1158   CREATININE 0.69 06/14/2019 1158   CALCIUM 8.6 (L) 06/14/2019 1158   PROT 7.0 06/14/2019 1158   ALBUMIN 4.2 06/14/2019 1158   AST 18 06/14/2019 1158   ALT 14 06/14/2019 1158   ALKPHOS 122 06/14/2019 1158   BILITOT 0.6 06/14/2019 1158   GFRNONAA >60 06/14/2019 1158   GFRAA >60 06/14/2019 1158         RADIOGRAPHY: DG Chest 2 View  Result Date: 12/01/2020 CLINICAL DATA:  70 year old female with history of COPD and lung cancer presenting with shortness of breath. EXAM: CHEST - 2 VIEW COMPARISON:  Chest x-ray 11/21/2020. FINDINGS: Status post left upper lobectomy with compensatory hyperexpansion of  the left lower lobe. Chronic elevation of the left hemidiaphragm again noted. Diffuse emphysematous changes. There continues to be some areas of interstitial prominence throughout the lungs bilaterally, but overall aeration has improved compared to the prior examination throughout the lungs bilaterally. There is a suture line in the superior segment of the left lower lobe near the apex of the left hemithorax with increasing mass-like opacity associated with this, concerning for potential local recurrence of disease. No pleural effusions. No evidence of pulmonary edema. Heart size is normal. Right internal jugular single-lumen porta cath with tip terminating in the distal superior vena cava. Surgical clips project over the left hemithorax. IMPRESSION: 1. Improving aeration in the lungs bilaterally compatible with resolving multilobar bilateral pneumonia. 2. Increasing mass-like opacity in the superior segment of the left lower lobe adjacent to a suture line highly concerning for potential recurrent neoplasm. Further evaluation with nonemergent contrast enhanced chest CT is strongly recommended in the near future to better evaluate this finding. 3. Emphysema. 4. Additional incidental findings, as above. Electronically Signed   By: Vinnie Langton M.D.   On: 12/01/2020 09:45   CT Chest Wo Contrast  Result Date: 12/11/2020 CLINICAL DATA:  Restaging Lung cancer EXAM: CT CHEST WITHOUT CONTRAST TECHNIQUE: Multidetector CT imaging of the chest was performed following the standard protocol without IV contrast. COMPARISON:  08/01/2020 FINDINGS: Cardiovascular: The heart size appears within normal limits. Trace pericardial effusion/thickening, unchanged. Aortic atherosclerosis. Coronary artery calcifications. Mediastinum/Nodes: No axillary adenopathy. Left supraclavicular lymph node is new from the previous exam measuring 2.4 x 2.3 cm, image 19/2. Anterior mediastinal lymph node is new from previous exam measuring 1.3 cm,  image 57/2. Lungs/Pleura: Advanced changes of paraseptal and centrilobular emphysema. Signs of tumor recurrence within the apical portion of the left hemithorax with large rind of increased soft tissue measuring 7.4 x 4.8 by 5.9 cm, image 15/2. Tumor extends into the left upper thoracic spine paravertebral soft tissues, image 22/2. Signs of pleural metastases is noted with rind overlying the lateral and posterior left lower lung, image 46/2 and image 86/2. New interstitial thickening is identified within the left upper lung, which in the absence of interval external beam radiation is concerning for lymphangitic spread of disease, image 30/3. Progressive pleural based mass within the paravertebral right upper lobe  measures 4.4 x 1.7 by 5.2 cm, image 39/3. Previously this measured 1.6 by 0.6 by 2.0 cm. Upper Abdomen: No acute abnormality within the imaged portions of the upper abdomen. Indeterminate focus of increased soft tissue between the upper pole of left kidney and the right adrenal gland measures 1.7 x 2.1 cm, image 127/2. Aortic atherosclerosis. Scattered liver cysts. Musculoskeletal: No acute or suspicious osseous findings. Marked upper thoracic spine kyphosis. IMPRESSION: 1. Signs of tumor recurrence within the apical portion of the left hemithorax with large rind of increased soft tissue. Tumor extends into the left upper thoracic spine paravertebral soft tissues. 2. Pleural spread of disease is noted with mild progressive pleural thickening overlying the posterior and lateral left lower lung. 3. Interval development of increased interstitial markings within the left upper lobe adjacent to mass. In the absence of interval external beam radiation (since 08/01/2020) this is suspicious for lymphangitic spread of tumor. 4. Progressive soft tissue mass the within the paravertebral right upper lobe concerning for metastatic disease. 5. New enlarged anterior mediastinal and left supraclavicular lymph nodes  compatible with metastatic adenopathy. 6. Indeterminate soft tissue attenuating nodular density between the upper pole of left kidney and left adrenal gland. Cannot exclude nodal metastasis or adrenal gland metastasis. 7. Aortic atherosclerosis. Aortic Atherosclerosis (ICD10-I70.0). Electronically Signed   By: Kerby Moors M.D.   On: 12/11/2020 15:00   NM Sentinel Node Inj-No Rpt (Breast)  Result Date: 11/21/2020 Sulfur colloid was injected by the nuclear medicine technologist for melanoma sentinel node.   MM Breast Surgical Specimen  Result Date: 11/21/2020 CLINICAL DATA:  Evaluate surgical specimen following lumpectomy for LEFT breast cancer. EXAM: SPECIMEN RADIOGRAPH OF THE LEFT BREAST COMPARISON:  Previous exam(s). FINDINGS: Status post excision of the LEFT breast. The radioactive seed and SAVI scout device are present, completely intact, and were marked for pathology. IMPRESSION: Specimen radiograph of the LEFT breast. Electronically Signed   By: Margarette Canada M.D.   On: 11/21/2020 11:47   DG Chest Port 1 View  Result Date: 11/21/2020 CLINICAL DATA:  Port-A-Cath insertion. EXAM: PORTABLE CHEST 1 VIEW COMPARISON:  CT chest 08/01/2020.  Chest x-ray 07/07/2018. FINDINGS: PowerPort catheter noted with tip over SVC. Cardiomegaly. Postsurgical changes of left upper lobectomy again noted. Progressive left apical pleural thickening cannot be excluded and contrast-enhanced chest CT is suggested for further evaluation. Chronic bilateral interstitial changes are noted. Superimposed mild acute infiltrate right upper lung noted. Stable left base pleural thickening. No prominent pleural effusion. No pneumothorax. IMPRESSION: 1. PowerPort catheter noted with tip over SVC. 2. Postsurgical changes of left upper lobectomy again noted. Progressive left apical pleural thickening cannot be excluded and contrast-enhanced chest CT is suggested for further evaluation. 3. Chronic bilateral interstitial changes are noted.  Superimposed mild acute infiltrate right upper lung noted. Electronically Signed   By: Marcello Moores  Register   On: 11/21/2020 12:54   DG Fluoro Guide CV Line-No Report  Result Date: 11/21/2020 Fluoroscopy was utilized by the requesting physician.  No radiographic interpretation.   [place] LT RADIOACTIVE SEED LOC  Result Date: 11/21/2020 CLINICAL DATA:  70 year old female for radioactive seed localization of LEFT breast cancer prior to lumpectomy. EXAM: ULTRASOUND GUIDED RADIOACTIVE SEED LOCALIZATION OF THE LEFT BREAST COMPARISON:  Previous exam(s). FINDINGS: Patient presents for radioactive seed localization prior to LEFT lumpectomy. I met with the patient and we discussed the procedure of seed localization including benefits and alternatives. We discussed the high likelihood of a successful procedure. We discussed the risks of the procedure including  infection, bleeding, tissue injury and further surgery. We discussed the low dose of radioactivity involved in the procedure. Informed, written consent was given. The usual time-out protocol was performed immediately prior to the procedure. Using ultrasound guidance, sterile technique, 1% lidocaine and an I-125 radioactive seed, the 1.2 cm mass containing a SAVI scout marker at the 11 o'clock position of the LEFT breast 8 cm from the nipple was localized using a MEDIAL approach. The follow-up mammogram images confirm the seed in the expected location and were marked for Dr. Brantley Stage. Follow-up survey of the patient confirms presence of the radioactive seed. Order number of I-125 seed:  [id number]. Total activity:  3.329 millicuries.  Reference Date: 10/25/2020 The patient tolerated the procedure well and was released from the [facility]. She was given instructions regarding seed removal. IMPRESSION: Radioactive seed localization LEFT breast. No apparent complications. Electronically Signed   By: Margarette Canada M.D.   On: 11/21/2020 08:36   MM CLIP PLACEMENT LEFT  Result  Date: 11/21/2020 CLINICAL DATA:  Evaluate LEFT breast radioactive seed placement by ultrasound guidance. EXAM: DIAGNOSTIC LEFT MAMMOGRAM POST ULTRASOUND-GUIDED RADIOACTIVE SEED PLACEMENT COMPARISON:  Previous exam(s). FINDINGS: Mammographic images were obtained following ultrasound-guided radioactive seed placement. These demonstrate the radioactive seed within the UPPER LEFT breast mass and adjacent to the SAVI scout device. IMPRESSION: Appropriate location of the radioactive seed within the LEFT breast. Final Assessment: Post Procedure Mammograms for Seed Placement Electronically Signed   By: Margarette Canada M.D.   On: 11/21/2020 08:37      IMPRESSION/PLAN:  Today, I talked to the patient about the findings and work-up thus far. We discussed the patient's diagnosis of previous lung cancer AND breast cancer and new findings on her CT chest w/o contrast demonstrating lung, pleural, and lymphatic disease c/w metastases of unknown primary.  Biopsy pending, as well as PET. She is eligible for palliative RT to the to chest for her pain.  This is not urgent, however. She states that her pain is not bad enough to proceed w/ palliative RT thus far.It has been present for months, and is not changing quickly, and she is about to start narcotics to see if they help. I think it's best to get the biopsy and PET out of the way and start systemic therapy for now. If pain worsens despite medications and systemic therapy, I am happy to plan RT quickly. The PET would be helpful to ensure we don't miss any adjacent bone/spine disease that might not be showing up on her non contrasted CT.    Patient is pleased with this plan. I will see her back PRN. I have personally communicated with Dr Burr Medico. Also of note, I do not recommend radiotherapy to the breast given the stage IV disease that has become apparent since her lumpectomy.  On date of service, in total, I spent 50 minutes on this encounter. Patient was seen in person.    __________________________________________   Eppie Gibson, MD  This document serves as a record of services personally performed by Eppie Gibson, MD. It was created on his behalf by Clerance Lav, a trained medical scribe. The creation of this record is based on the scribe's personal observations and the provider's statements to them. This document has been checked and approved by the attending provider.

## 2020-12-13 NOTE — Progress Notes (Signed)
I received a message from Dr. Julien Nordmann.  He would like to see Ms. Pareja next week.  I updated new patient coordinator to call and schedule to see him and lab work.

## 2020-12-14 ENCOUNTER — Encounter (HOSPITAL_COMMUNITY): Payer: Self-pay

## 2020-12-14 ENCOUNTER — Other Ambulatory Visit: Payer: Medicare Other

## 2020-12-14 ENCOUNTER — Encounter: Payer: Self-pay | Admitting: Radiation Oncology

## 2020-12-14 ENCOUNTER — Telehealth: Payer: Self-pay | Admitting: *Deleted

## 2020-12-14 ENCOUNTER — Telehealth: Payer: Self-pay | Admitting: Hematology

## 2020-12-14 ENCOUNTER — Encounter: Payer: Self-pay | Admitting: *Deleted

## 2020-12-14 ENCOUNTER — Ambulatory Visit (HOSPITAL_BASED_OUTPATIENT_CLINIC_OR_DEPARTMENT_OTHER): Admission: RE | Admit: 2020-12-14 | Payer: Medicare Other | Source: Home / Self Care | Admitting: Surgery

## 2020-12-14 DIAGNOSIS — C3412 Malignant neoplasm of upper lobe, left bronchus or lung: Secondary | ICD-10-CM | POA: Insufficient documentation

## 2020-12-14 SURGERY — EXCISION, LESION, BREAST
Anesthesia: Choice | Site: Breast | Laterality: Left

## 2020-12-14 NOTE — Telephone Encounter (Signed)
Scheduled appt per 3/31 sch msg. Pt aware.  

## 2020-12-14 NOTE — Progress Notes (Unsigned)
    [place] Female, 70 y.o., 1951-09-06  MRN:  [id number] Phone:  [phone] Jerilynn Mages)       PCP:  [name], MD Primary Cvg:  Medicare/Medicare Part A And B  Next Appt With Radiology ([facility] 2) 12/22/2020 at 1:00 PM           Message Received: Today  Message Details  Markus Daft, MD  [facility] [place] E Please schedule US guided left neck lymph node biopsy ASAP.   Thanks,  Markus Daft    Previous Messages  ----- Message -----  From: Truitt Merle, MD  Sent: 12/13/2020 11:58 PM EDT  To: Markus Daft, MD, Greggory Keen, MD, *   Drs. Henn and Shick,   Could you review her recent CT chest and approve her left [place] node biopsy? I need tissue diagnosis asap, thank you so much.   Santiago Glad, please schedule her biopsy with IR tomorrow. Please also help to schedule PET asap after it's approved. Schedule her to see me back 2 days after biopsy   Thanks   Krista Blue

## 2020-12-14 NOTE — Progress Notes (Signed)
I followed up on Valerie Mosley PET scan, it is auth with insurance and scheduled.

## 2020-12-14 NOTE — Telephone Encounter (Signed)
I received a message from Dr. Burr Medico that patient would like to be see Dr. Burr Medico and not Dr. Julien Nordmann at this time. I called patient and she would like to stay with Dr. Burr Medico.  I listened as she explained. I cancelled appts with Dr. Julien Nordmann next week.

## 2020-12-20 ENCOUNTER — Other Ambulatory Visit: Payer: Self-pay | Admitting: Radiology

## 2020-12-20 ENCOUNTER — Other Ambulatory Visit: Payer: Medicare Other

## 2020-12-20 ENCOUNTER — Ambulatory Visit: Payer: Medicare Other | Admitting: Internal Medicine

## 2020-12-21 ENCOUNTER — Other Ambulatory Visit: Payer: Self-pay | Admitting: Radiology

## 2020-12-22 ENCOUNTER — Encounter (HOSPITAL_COMMUNITY): Payer: Self-pay

## 2020-12-22 ENCOUNTER — Other Ambulatory Visit: Payer: Self-pay

## 2020-12-22 ENCOUNTER — Ambulatory Visit (HOSPITAL_COMMUNITY)
Admission: RE | Admit: 2020-12-22 | Discharge: 2020-12-22 | Disposition: A | Discharge status: Home / Self Care | Payer: Medicare Other | Source: Ambulatory Visit | Attending: Hematology | Admitting: Hematology

## 2020-12-22 DIAGNOSIS — Z87891 Personal history of nicotine dependence: Secondary | ICD-10-CM | POA: Diagnosis not present

## 2020-12-22 DIAGNOSIS — Z808 Family history of malignant neoplasm of other organs or systems: Secondary | ICD-10-CM | POA: Diagnosis not present

## 2020-12-22 DIAGNOSIS — Z79899 Other long term (current) drug therapy: Secondary | ICD-10-CM | POA: Diagnosis not present

## 2020-12-22 DIAGNOSIS — R590 Localized enlarged lymph nodes: Secondary | ICD-10-CM | POA: Diagnosis not present

## 2020-12-22 DIAGNOSIS — Z800 Family history of malignant neoplasm of digestive organs: Secondary | ICD-10-CM | POA: Insufficient documentation

## 2020-12-22 DIAGNOSIS — Z853 Personal history of malignant neoplasm of breast: Secondary | ICD-10-CM | POA: Insufficient documentation

## 2020-12-22 DIAGNOSIS — I10 Essential (primary) hypertension: Secondary | ICD-10-CM | POA: Insufficient documentation

## 2020-12-22 DIAGNOSIS — Z801 Family history of malignant neoplasm of trachea, bronchus and lung: Secondary | ICD-10-CM | POA: Insufficient documentation

## 2020-12-22 DIAGNOSIS — Z85118 Personal history of other malignant neoplasm of bronchus and lung: Secondary | ICD-10-CM | POA: Insufficient documentation

## 2020-12-22 DIAGNOSIS — C770 Secondary and unspecified malignant neoplasm of lymph nodes of head, face and neck: Secondary | ICD-10-CM | POA: Insufficient documentation

## 2020-12-22 DIAGNOSIS — R591 Generalized enlarged lymph nodes: Secondary | ICD-10-CM | POA: Diagnosis present

## 2020-12-22 DIAGNOSIS — J449 Chronic obstructive pulmonary disease, unspecified: Secondary | ICD-10-CM | POA: Diagnosis not present

## 2020-12-22 DIAGNOSIS — D0512 Intraductal carcinoma in situ of left breast: Secondary | ICD-10-CM | POA: Diagnosis not present

## 2020-12-22 DIAGNOSIS — C3490 Malignant neoplasm of unspecified part of unspecified bronchus or lung: Secondary | ICD-10-CM

## 2020-12-22 DIAGNOSIS — C801 Malignant (primary) neoplasm, unspecified: Secondary | ICD-10-CM | POA: Diagnosis not present

## 2020-12-22 MED ORDER — SODIUM CHLORIDE 0.9 % IV SOLN: INTRAVENOUS | Status: DC

## 2020-12-22 MED ORDER — FENTANYL CITRATE (PF) 100 MCG/2ML IJ SOLN
INTRAMUSCULAR | Status: AC
  Filled 2020-12-22: qty 2

## 2020-12-22 MED ORDER — MIDAZOLAM HCL 2 MG/2ML IJ SOLN
INTRAMUSCULAR | Status: AC | PRN
  Administered 2020-12-22: 1 mg via INTRAVENOUS

## 2020-12-22 MED ORDER — LIDOCAINE HCL (PF) 1 % IJ SOLN
INTRAMUSCULAR | Status: AC
  Filled 2020-12-22: qty 30

## 2020-12-22 MED ORDER — MIDAZOLAM HCL 2 MG/2ML IJ SOLN
INTRAMUSCULAR | Status: AC
  Filled 2020-12-22: qty 2

## 2020-12-22 MED ORDER — FENTANYL CITRATE (PF) 100 MCG/2ML IJ SOLN
INTRAMUSCULAR | Status: AC | PRN
Start: 2020-12-22 — End: 2020-12-22
  Administered 2020-12-22: 50 ug via INTRAVENOUS

## 2020-12-22 NOTE — H&P (Signed)
Chief Complaint: Metastatic adenopathy  Referring Physician(s): Truitt Merle  Supervising Physician: Corrie Mckusick  Patient Status: [facility] - [facility]  History of Present Illness: Valerie Mosley is a 70 y.o. female with history of hypertension, COPD, and non-small cell lung cancer which was originally diagnosed in April 2013.  She underwent wedge resection and lymph node resections.  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  She underwent lumpectomy of the left breast by Dr. Brantley Stage on November 21, 2020.  CT done 12/11/20 showed= 1. Signs of tumor recurrence within the apical portion of the left hemithorax with large rind of increased soft tissue. Tumor extends into the left upper thoracic spine paravertebral soft tissues. 2. Pleural spread of disease is noted with mild progressive pleural thickening overlying the posterior and lateral left lower lung. 3. Interval development of increased interstitial markings within the left upper lobe adjacent to mass. In the absence of interval external beam radiation (since 08/01/2020) this is suspicious for lymphangitic spread of tumor. 4. Progressive soft tissue mass the within the paravertebral right upper lobe concerning for metastatic disease. 5. New enlarged anterior mediastinal and left supraclavicular lymph nodes compatible with metastatic adenopathy.  She is here today for image guided biopsy of the left supraclavicular lymph node.  She is n.p.o. No nausea/vomiting. No Fever/chills. ROS negative.   Past Medical History:  Diagnosis Date  . Breast cancer ([place])   . Cancer ([place])    Stage IA non-small cell lung cancer, left upper lobectomy 12/2011  . COPD (chronic obstructive pulmonary disease) ([place])   . Cough 07/29/11   started coughing up blood this am  . Cough   . Hypertension   . Kyphoscoliosis   . Pneumonia   . PONV (postoperative  nausea and vomiting)   . Recurrent upper respiratory infection (URI)     Past Surgical History:  Procedure Laterality Date  . BREAST LUMPECTOMY WITH RADIOACTIVE SEED AND SENTINEL LYMPH NODE BIOPSY Left 11/21/2020   Procedure: LEFT BREAST LUMPECTOMY WITH RADIOACTIVE SEED AND LEFT SENTINEL LYMPH NODE [place];  Surgeon: Erroll Luna, MD;  Location: [place];  Service: General;  Laterality: Left;  PEC BLOCK; START TIME OF 11:00 AM FOR 90 MINUTES IN ROOM 2  . BRONCHOSCOPY    . CATARACT EXTRACTION, BILATERAL  2016  . EYE SURGERY    . INSERTION OF IBV VALVE Left 04/16/2018   Procedure: INSERTION OF INTERBRONCHIAL VALVE (IBV);  Surgeon: [name], MD;  Location: [place];  Service: Thoracic;  Laterality: Left;  . INSERTION OF IBV VALVE N/A 06/25/2018   Procedure: REMOVAL OF THREE INTERBRONCHIAL VALVE (IBV);  Surgeon: [name], MD;  Location: [place];  Service: Thoracic;  Laterality: N/A;  . LOBECTOMY  12/17/2011   Procedure: LOBECTOMY;  Surgeon: [name], MD;  Location: [place];  Service: Thoracic;  Laterality: Left;  (L)VATS, WEDGE RESECTION, LEFT UPPER LOBECTOMY,  Multiple node biopsies  . PORTACATH PLACEMENT Left 11/21/2020   Procedure: INSERTION PORT-A-CATH;  Surgeon: Erroll Luna, MD;  Location: [place];  Service: General;  Laterality: Left;  . REMOVAL OF PLEURAL DRAINAGE CATHETER Left 05/06/2018   Procedure: REMOVAL OF CHEST TUBE;  Surgeon: [name], MD;  Location: [place];  Service: Thoracic;  Laterality: Left;  . THYROID SURGERY  1980   1/2 thyroid removed - benign  . URETHRA SURGERY    . VIDEO ASSISTED THORACOSCOPY Left 04/08/2018   Procedure: REDO LEFT  VIDEO ASSISTED THORACOSCOPY with left lower lobe wedge resection;  Surgeon: [name], MD;  Location: [place];  Service: Thoracic;  Laterality: Left;  Marland Kitchen VIDEO BRONCHOSCOPY N/A 04/16/2018   Procedure: VIDEO BRONCHOSCOPY;  Surgeon: [name], MD;  Location: [place];  Service: Thoracic;   Laterality: N/A;  . VIDEO BRONCHOSCOPY N/A 06/25/2018   Procedure: VIDEO BRONCHOSCOPY;  Surgeon: [name], MD;  Location: [facility] OR;  Service: Thoracic;  Laterality: N/A;    Allergies: Patient has no known allergies.  Medications: Prior to Admission medications   Medication Sig Start Date End Date Taking? Authorizing Provider  acetaminophen (TYLENOL) 500 MG tablet Take 1,000 mg by mouth every 6 (six) hours as needed for moderate pain or headache.   Yes [provider]  acyclovir ointment (ZOVIRAX) 5 % Apply 1 application topically every 3 (three) hours as needed. Patient taking differently: Apply 1 application topically every 3 (three) hours as needed (cold sores). 08/25/20  Yes Collene Gobble, MD  albuterol (VENTOLIN HFA) 108 (90 Base) MCG/ACT inhaler Inhale 2 puffs into the lungs every 4 (four) hours as needed for wheezing or shortness of breath. For shortness of breath 11/20/15  Yes Byrum, Rose Fillers, MD  amLODipine (NORVASC) 10 MG tablet Take 10 mg by mouth daily.   Yes [provider]  BEVESPI AEROSPHERE 9-4.8 MCG/ACT AERO INHALE 2 PUFFS INTO THE LUNGS 2 TIMES DAILY. Patient taking differently: Inhale 2 puffs into the lungs in the morning and at bedtime. 01/03/20  Yes Collene Gobble, MD  colestipol (COLESTID) 1 g tablet Take 1 g by mouth daily as needed (diarrhea).   Yes [provider]  montelukast (SINGULAIR) 10 MG tablet Take 1 tablet (10 mg total) by mouth at bedtime. 09/10/17  Yes Collene Gobble, MD  oxyCODONE (OXY IR/ROXICODONE) 5 MG immediate release tablet Take 1 tablet (5 mg total) by mouth every 4 (four) hours as needed for severe pain. 12/12/20  Yes Truitt Merle, MD  sertraline (ZOLOFT) 50 MG tablet Take 50 mg by mouth at bedtime.   Yes [provider]  HYDROcodone-acetaminophen (NORCO/VICODIN) 5-325 MG tablet Take 1 tablet by mouth every 6 (six) hours as needed for moderate pain. Patient not taking: No sig reported 11/21/20   Erroll Luna, MD     Family History  Problem Relation Age of Onset  . Cancer Father 76       metastatic cancer   . Cancer Brother 60       brain tumor   . Cancer Cousin        colon cancer  . Cancer Cousin        lung cancer    Social History   Socioeconomic History  . Marital status: Married    Spouse name: Not on file  . Number of children: 1  . Years of education: Not on file  . Highest education level: Not on file  Occupational History  . Occupation: computer data entry   Tobacco Use  . Smoking status: Former Smoker    Packs/day: 0.50    Years: 40.00    Pack years: 20.00    Types: Cigarettes    Quit date: 12/16/2011    Years since quitting: 9.0  . Smokeless tobacco: Never Used  Vaping Use  . Vaping Use: Never used  Substance and Sexual Activity  . Alcohol use: Yes    Alcohol/week: 14.0 standard drinks    Types: 7 Glasses of wine, 7 Cans of beer  per week    Comment: 1 per day   . Drug use: No  . Sexual activity: Not Currently    Birth control/protection: Post-menopausal  Other Topics Concern  . Not on file  Social History Narrative  . Not on file   Social Determinants of Health   Financial Resource Strain: Not on file  Food Insecurity: Not on file  Transportation Needs: Not on file  Physical Activity: Not on file  Stress: Not on file  Social Connections: Not on file     Review of Systems: A 12 point ROS discussed and pertinent positives are indicated in the HPI above.  All other systems are negative.  Review of Systems  Vital Signs: BP 134/81   Pulse 88   Temp 98.5 F (36.9 C) (Oral)   Ht 5\' 4"  (1.626 m)   Wt 53.5 kg   SpO2 96%   BMI 20.25 kg/m   Physical Exam Vitals reviewed.  Constitutional:      Appearance: Normal appearance.  HENT:     Head: Normocephalic and atraumatic.  Eyes:     Extraocular Movements: Extraocular movements intact.  Cardiovascular:     Rate and Rhythm: Normal rate and regular rhythm.  Pulmonary:     Effort:  Pulmonary effort is normal. No respiratory distress.     Breath sounds: Normal breath sounds.  Abdominal:     General: There is no distension.     Palpations: Abdomen is soft.     Tenderness: There is no abdominal tenderness.  Musculoskeletal:        General: Normal range of motion.  Skin:    General: Skin is warm and dry.  Neurological:     General: No focal deficit present.     Mental Status: She is alert and oriented to person, place, and time.  Psychiatric:        Mood and Affect: Mood normal.        Behavior: Behavior normal.        Thought Content: Thought content normal.        Judgment: Judgment normal.     Imaging: DG Chest 2 View  Result Date: 12/01/2020 CLINICAL DATA:  70 year old female with history of COPD and lung cancer presenting with shortness of breath. EXAM: CHEST - 2 VIEW COMPARISON:  Chest x-ray 11/21/2020. FINDINGS: Status post left upper lobectomy with compensatory hyperexpansion of the left lower lobe. Chronic elevation of the left hemidiaphragm again noted. Diffuse emphysematous changes. There continues to be some areas of interstitial prominence throughout the lungs bilaterally, but overall aeration has improved compared to the prior examination throughout the lungs bilaterally. There is a suture line in the superior segment of the left lower lobe near the apex of the left hemithorax with increasing mass-like opacity associated with this, concerning for potential local recurrence of disease. No pleural effusions. No evidence of pulmonary edema. Heart size is normal. Right internal jugular single-lumen porta cath with tip terminating in the distal superior vena cava. Surgical clips project over the left hemithorax. IMPRESSION: 1. Improving aeration in the lungs bilaterally compatible with resolving multilobar bilateral pneumonia. 2. Increasing mass-like opacity in the superior segment of the left lower lobe adjacent to a suture line highly concerning for potential  recurrent neoplasm. Further evaluation with nonemergent contrast enhanced chest CT is strongly recommended in the near future to better evaluate this finding. 3. Emphysema. 4. Additional incidental findings, as above. Electronically Signed   By: Vinnie Langton M.D.   On: 12/01/2020  09:45   CT Chest Wo Contrast  Result Date: 12/11/2020 CLINICAL DATA:  Restaging Lung cancer EXAM: CT CHEST WITHOUT CONTRAST TECHNIQUE: Multidetector CT imaging of the chest was performed following the standard protocol without IV contrast. COMPARISON:  08/01/2020 FINDINGS: Cardiovascular: The heart size appears within normal limits. Trace pericardial effusion/thickening, unchanged. Aortic atherosclerosis. Coronary artery calcifications. Mediastinum/Nodes: No axillary adenopathy. Left supraclavicular lymph node is new from the previous exam measuring 2.4 x 2.3 cm, image 19/2. Anterior mediastinal lymph node is new from previous exam measuring 1.3 cm, image 57/2. Lungs/Pleura: Advanced changes of paraseptal and centrilobular emphysema. Signs of tumor recurrence within the apical portion of the left hemithorax with large rind of increased soft tissue measuring 7.4 x 4.8 by 5.9 cm, image 15/2. Tumor extends into the left upper thoracic spine paravertebral soft tissues, image 22/2. Signs of pleural metastases is noted with rind overlying the lateral and posterior left lower lung, image 46/2 and image 86/2. New interstitial thickening is identified within the left upper lung, which in the absence of interval external beam radiation is concerning for lymphangitic spread of disease, image 30/3. Progressive pleural based mass within the paravertebral right upper lobe measures 4.4 x 1.7 by 5.2 cm, image 39/3. Previously this measured 1.6 by 0.6 by 2.0 cm. Upper Abdomen: No acute abnormality within the imaged portions of the upper abdomen. Indeterminate focus of increased soft tissue between the upper pole of left kidney and the right adrenal  gland measures 1.7 x 2.1 cm, image 127/2. Aortic atherosclerosis. Scattered liver cysts. Musculoskeletal: No acute or suspicious osseous findings. Marked upper thoracic spine kyphosis. IMPRESSION: 1. Signs of tumor recurrence within the apical portion of the left hemithorax with large rind of increased soft tissue. Tumor extends into the left upper thoracic spine paravertebral soft tissues. 2. Pleural spread of disease is noted with mild progressive pleural thickening overlying the posterior and lateral left lower lung. 3. Interval development of increased interstitial markings within the left upper lobe adjacent to mass. In the absence of interval external beam radiation (since 08/01/2020) this is suspicious for lymphangitic spread of tumor. 4. Progressive soft tissue mass the within the paravertebral right upper lobe concerning for metastatic disease. 5. New enlarged anterior mediastinal and left supraclavicular lymph nodes compatible with metastatic adenopathy. 6. Indeterminate soft tissue attenuating nodular density between the upper pole of left kidney and left adrenal gland. Cannot exclude nodal metastasis or adrenal gland metastasis. 7. Aortic atherosclerosis. Aortic Atherosclerosis (ICD10-I70.0). Electronically Signed   By: Kerby Moors M.D.   On: 12/11/2020 15:00    Labs:  CBC: No results for input(s): WBC, HGB, HCT, PLT in the last 8760 hours.  COAGS: No results for input(s): INR, APTT in the last 8760 hours.  BMP: No results for input(s): NA, K, CL, CO2, GLUCOSE, BUN, CALCIUM, CREATININE, GFRNONAA, GFRAA in the last 8760 hours.  Invalid input(s): CMP  LIVER FUNCTION TESTS: No results for input(s): BILITOT, AST, ALT, ALKPHOS, PROT, ALBUMIN in the last 8760 hours.  TUMOR MARKERS: No results for input(s): AFPTM, CEA, CA199, CHROMGRNA in the last 8760 hours.  Assessment and Plan:  History of lung cancer status post wedge resection back in 2013.  Now with invasive ductal carcinoma  of the left breast.  New enlarged anterior mediastinal and left supraclavicular lymph nodes compatible with metastatic adenopathy.  We will proceed with image guided biopsy of the left supraclavicular lymph node today by Dr. Earleen Newport.  Risks and benefits of lymph node biopsy was discussed with the  patient and/or patient's family including, but not limited to bleeding, infection, damage to adjacent structures or low yield requiring additional tests.  All of the questions were answered and there is agreement to proceed.  Consent signed and in chart.  Thank you for this interesting consult.  I greatly enjoyed meeting Valerie Mosley and look forward to participating in their care.  A copy of this report was sent to the requesting provider on this date.  Electronically Signed: Murrell Redden, PA-C   12/22/2020, 12:48 PM      I spent a total of  30 Minutes in face to face in clinical consultation, greater than 50% of which was counseling/coordinating care for supraclavicular lymph node biopsy.

## 2020-12-22 NOTE — Discharge Instructions (Signed)

## 2020-12-22 NOTE — Procedures (Signed)
Interventional Radiology Procedure Note  Procedure: US guided biopsy of left neck lymph node.  Complications: None EBL: None Recommendations: - Bedrest12 hours.   - Routine wound care - Follow up pathology - Advance diet   Signed,  Corrie Mckusick, DO

## 2020-12-26 LAB — SURGICAL PATHOLOGY

## 2020-12-27 ENCOUNTER — Ambulatory Visit (HOSPITAL_COMMUNITY)
Admission: RE | Admit: 2020-12-27 | Discharge: 2020-12-27 | Disposition: A | Discharge status: Home / Self Care | Payer: Medicare Other | Source: Ambulatory Visit | Attending: Hematology | Admitting: Hematology

## 2020-12-27 ENCOUNTER — Telehealth: Payer: Self-pay | Admitting: Hematology

## 2020-12-27 ENCOUNTER — Other Ambulatory Visit: Payer: Self-pay

## 2020-12-27 DIAGNOSIS — C7951 Secondary malignant neoplasm of bone: Secondary | ICD-10-CM | POA: Insufficient documentation

## 2020-12-27 DIAGNOSIS — C3490 Malignant neoplasm of unspecified part of unspecified bronchus or lung: Secondary | ICD-10-CM

## 2020-12-27 DIAGNOSIS — C3492 Malignant neoplasm of unspecified part of left bronchus or lung: Secondary | ICD-10-CM | POA: Diagnosis not present

## 2020-12-27 LAB — GLUCOSE, CAPILLARY: Glucose-Capillary: 90 mg/dL (ref 70–99)

## 2020-12-27 MED ORDER — FLUDEOXYGLUCOSE F - 18 (FDG) INJECTION
5.2000 | Freq: Once | INTRAVENOUS | Status: AC | PRN
  Administered 2020-12-27: 5.9 via INTRAVENOUS

## 2020-12-27 NOTE — Telephone Encounter (Signed)
Scheduled appt per 4/12 sch msg. Pt aware.  

## 2020-12-27 NOTE — Progress Notes (Signed)
Valerie Mosley   [phone] [phone]   Clinic Follow up Note   Patient Care Team: [name], MD as PCP - General (Family Medicine) Collene Gobble, MD (Pulmonary Disease) Erroll Luna, MD as Consulting Physician (General Surgery) Truitt Merle, MD as Consulting Physician (Hematology)  Date of Service:  12/27/2020  CHIEF COMPLAINT: f/u of metastatic lung cancer  SUMMARY OF ONCOLOGIC HISTORY: Oncology History Overview Note  Cancer Staging Non-small cell lung cancer [facility]) Staging form: Lung, AJCC 7th Edition - Clinical: Stage IA (Free text: Ia) - Signed by [name], MD on 12/28/2013 Laterality: Left Cancer stage: Ia - Pathologic: Ia - Signed by [name], MD on 12/28/2013 Laterality: Left Cancer stage: Ia    Non-small cell lung cancer ([place])  09/02/2011 Imaging   CT Chest  IMPRESSION:   1.  Interval clearing of right upper lobe pneumonia.  2.  Left upper lobe nodule is unchanged in the short interval from  07/29/2011.  Follow-up could be performed in 3 months to ensure  continued stability. This recommendation follows the consensus  statement: Guidelines for Management of Small Pulmonary Nodules  Detected on CT Scans:  A Statement from the [employer] as  published in Radiology 2005; 237:395-400.  Available online at:  [email or web address]  3.  Additional scattered pulmonary nodules can be reevaluated on  future imaging as well.    11/06/2011 Imaging   PET IMPRESSION:   1.  Mild hypermetabolism which projects minimally cephalad to, but  is felt to correspond to the left upper lobe lung nodule.  Although  not within malignant range, given the small size of the nodule,  malignancy cannot be excluded.  Possible mild interval enlargement  of the nodule since 09/02/2011.  Consider repeat standard chest CT  to confirm interval enlargement.  Especially if interval   enlargement has occurred, tissue sampling would be suggested.  Alternatively, 62-monthfollow-up chest CT could be performed.  2.  No evidence of thoracic nodal or extrathoracic disease.  3. Vague hypermetabolism corresponding to a prominent right lobe of  the thyroid. Nonspecific.  Consider ultrasound correlation.IMPRESSION:   1.  Mild hypermetabolism which projects minimally cephalad to, but  is felt to correspond to the left upper lobe lung nodule.  Although  not within malignant range, given the small size of the nodule,  malignancy cannot be excluded.  Possible mild interval enlargement  of the nodule since 09/02/2011.  Consider repeat standard chest CT  to confirm interval enlargement.  Especially if interval  enlargement has occurred, tissue sampling would be suggested.  Alternatively, 337-monthollow-up chest CT could be performed.  2.  No evidence of thoracic nodal or extrathoracic disease.  3. Vague hypermetabolism corresponding to a prominent right lobe of  the thyroid. Nonspecific.  Consider ultrasound correlation.   12/17/2011 Surgery   Thoracoscopy by Dr [name] Diagnosis 1. Lung, wedge biopsy/resection, left upper lobe - INVASIVE WELL-DIFFERENTIATED ADENOCARCINOMA, SPANNING 1.2 CM. - NO LYMPH VASCULAR INVASION IDENTIFIED. - PLEURA IS UNINVOLVED. - MARGINS ARE NEGATIVE. - SEE ONCOLOGY TEMPLATE. 2. Lymph node, biopsy, level 9 - ONE BENIGN LYMPH NODE WITH NO TUMOR SEEN (0/1). 3. Lymph node, biopsy, level 9 - ONE BENIGN LYMPH NODE WITH NO TUMOR SEEN (0/1). 4. Lymph node, biopsy, 11 - ONE BENIGN LYMPH NODE WITH NO TUMOR SEEN (0/1). 5. Lymph node, biopsy, 11 #2 - ONE BENIGN LYMPH NODE WITH NO TUMOR SEEN (0/1). 6. Lymph node, biopsy, 5 - ONE BENIGN LYMPH NODE  WITH NO TUMOR SEEN (0/1). 7. Lymph node, biopsy, 10 - ONE BENIGN LYMPH NODE WITH NO TUMOR SEEN (0/1). 8. Lymph node, biopsy, 11 #3 - ONE BENIGN LYMPH NODE WITH NO TUMOR SEEN (0/1). 9. Lung, resection  (segmental or lobe), Remainder of left upper lobe - BENIGN LUNG PARENCHYMA WITH CHRONIC INFLAMMATION AND INCREASED PULMONARY MACROPHAGES. - THREE BENIGN LYMPH NODES IDENTIFIED (0/3). - NO TUMOR SEEN.    01/08/2012 Initial Diagnosis   Non-small cell lung cancer ([place])   02/10/2018 Imaging   CT Chest IMPRESSION: 1. Enlarging irregular pleural-based solid 1.9 cm pulmonary nodule in the superior segment left lower lobe, suspicious for malignancy, either recurrent disease or a metachronous primary bronchogenic carcinoma. Consider PET-CT for further characterization. 2. Additional scattered bilateral pulmonary nodules are stable and considered benign. 3. No noncontrast CT evidence of thoracic adenopathy. 4. Three-vessel coronary atherosclerosis. 5. Nonobstructing left nephrolithiasis.   Aortic Atherosclerosis (ICD10-I70.0) and Emphysema (ICD10-J43.9).   02/18/2018 PET scan   IMPRESSION: 1. Hypermetabolic apical nodule in the superior segment left lower lobe is highly worrisome for bronchogenic carcinoma. 2. No additional areas of abnormal hypermetabolism in the neck, chest, abdomen or pelvis. 3. Aortic atherosclerosis (ICD10-170.0). Coronary artery calcification. 4.  Emphysema (ICD10-J43.9). 5. Left renal stone.     03/06/2018 Pathology Results   Lung Biopsy  Diagnosis Lung, needle/core biopsy(ies), Left Lower Lobe - ADENOCARCINOMA. Microscopic Comment There is likely sufficient tissue for additional studies if requested (block 1B). Dr. Lyndon Code has reviewed the case.   04/08/2018 Surgery   Left Video assisted Thoracoscopy by Dr Roxan Hockey  Diagnosis Lung, wedge biopsy/resection, Left Lower Lobe - INVASIVE ADENOCARCINOMA, MODERATELY DIFFERENTIATED, SPANNING 1.3 CM. - ADENOCARCINOMA INVOLVES VISCERAL PLEURA. - ONE BENIGN INTRAPULMONARY LYMPH NODE (0/1). - LYMPHOVASCULAR INVASION IS IDENTIFIED, FOCAL. - THE SURGICAL RESECTION MARGINS ARE NEGATIVE FOR CARCINOMA. - SEE ONCOLOGY  TABLE BELOW.    08/01/2020 Imaging   CT Chest  IMPRESSION: Stable postop changes from prior left upper lobectomy. Stable small bilateral pulmonary nodules. No new or progressive disease within the thorax.   Aortic Atherosclerosis (ICD10-I70.0) and Emphysema (ICD10-J43.9).   Malignant neoplasm of upper-inner quadrant of left breast in female, estrogen receptor negative ([place])  10/18/2020 Mammogram   Mammogram 10/18/20 at [facility] FINDINGS:  Cc and MLO views of bilateral breasts are submitted. There is a  spiculated mass in the palpable area upper left breast. The right  breast is negative.   Targeted ultrasound is performed, showing 1.2 x 0.9 x 1.2 cm  spiculated hypoechoic mass at the left breast 11 o'clock 8 cm from  nipple palpable area. This correlates to the mammographic mass.  Ultrasound of the left axilla is negative.   IMPRESSION:  Highly suspicious findings.     10/26/2020 Initial Biopsy   Final Pathologic Diagnosis  10/26/20 at [facility] BREAST, LEFT 1:00 8 CM FROM NIPPLE, NEEDLE BIOPSY:              INVASIVE DUCTAL CARCINOMA, NOTTINGHAM GRADE 3. Comment   The Nottingham grade is 3 (3-tubule formation, 3-nuclear atypia, 3-mitoses).  This case was reviewed by Dr. Vicenta Dunning and she is in essential agreement with the above diagnosis.  Final Pathologic Diagnosis   Prognostic Markers in Cancer   Block Number:  [id number] Diagnosis:  Invasive ductal carcinoma     Results:   Estrogen Receptor:  Negative (<1%)   Progesterone Receptor:  Negative (<1%)   Her2:  Negative (score=1+)     Ki-67:  Percentage of tumor cells with nuclear positivity: 60 %  11/01/2020 Cancer Staging   Staging form: Breast, AJCC 8th Edition - Clinical stage from 11/01/2020: Stage IB (cT1c, cN0, cM0, G3, ER-, PR-, HER2-) - Signed by Truitt Merle, MD on 11/08/2020 Stage prefix: Initial diagnosis   11/08/2020 Initial Diagnosis   Malignant neoplasm of upper-inner quadrant of left breast in female,  estrogen receptor negative ([place])   11/21/2020 Surgery   LEFT BREAST LUMPECTOMY WITH RADIOACTIVE SEED AND LEFT SENTINEL LYMPH NODE [place] and PAC placement by Dr Brantley Stage    11/21/2020 Pathology Results   FINAL MICROSCOPIC DIAGNOSIS:   A. BREAST, LEFT, LUMPECTOMY:  - Invasive ductal carcinoma, grade 3, 1.3 cm  - The anterior resection margin is widely involved by carcinoma  - Carcinoma is 0.2 cm from posterior margin  - Biopsy site changes  - See oncology table   B. BREAST, LEFT ADDITIONAL SUPERIOR MARGIN, EXCISION:  - Unremarkable fibroadipose tissue, negative for carcinoma   C. BREAST, LEFT ADDITIONAL LATERAL MARGIN, EXCISION:  - Unremarkable fibroadipose tissue, negative for carcinoma   D. BREAST, LEFT ADDITIONAL INFEROMEDIAL MARGIN, EXCISION:  - Unremarkable fibroadipose tissue, negative for carcinoma   E. LYMPH NODE, LEFT AXILLARY, SENTINEL, EXCISION:  - Lymph node, negative for carcinoma (0/1)    11/21/2020 Cancer Staging   Staging form: Breast, AJCC 8th Edition - Pathologic stage from 11/21/2020: Stage IB (pT1c, pN0, cM0, G3, ER-, PR-, HER2-) - Signed by [name], NP on 12/20/2020 Stage prefix: Initial diagnosis Histologic grading system: 3 grade system      CURRENT THERAPY:  Pending first line chemo   INTERVAL HISTORY:  Valerie Mosley is here to review her recent PET scan results. She was last seen by me on 12/12/20. She presents to the clinic accompanied by her son Juliane Lack. She notes she also has support from her husband. She notes she feels the same as the last time she saw me. She notes continued pain to her right scapula, rated 5-6/10, and is sleeping on a heating pad. She notes it used to wake her up more frequently, but this has improved. She notes she has held off starting the pain medication I prescribed for her. She has been taking a tramadol at night and Advil (2 pills, TID) during the day. She is out of the tramadol now. She is not taking anything for  her stomach. She also notes a bothersome lump in her left clavicle area.  REVIEW OF SYSTEMS:   Constitutional: Denies fevers, chills or abnormal weight loss Eyes: Denies blurriness of vision Ears, nose, mouth, throat, and face: Denies mucositis or sore throat Respiratory: Denies cough, dyspnea or wheezes Cardiovascular: Denies palpitation, chest discomfort or lower extremity swelling Gastrointestinal:  Denies nausea, heartburn or change in bowel habits Skin: Denies abnormal skin rashes Lymphatics: Denies easy bruising, (+) nodule at left clavicle Neurological:Denies numbness, tingling or new weaknesses Musculoskeletal: (+) right scapula pain Behavioral/Psych: Mood is stable, no new changes  All other systems were reviewed with the patient and are negative.  MEDICAL HISTORY:  Past Medical History:  Diagnosis Date  . Breast cancer ([place])   . Cancer ([place])    Stage IA non-small cell lung cancer, left upper lobectomy 12/2011  . COPD (chronic obstructive pulmonary disease) ([place])   . Cough 07/29/11   started coughing up blood this am  . Cough   . Hypertension   . Kyphoscoliosis   . Pneumonia   . PONV (postoperative nausea and vomiting)   . Recurrent upper respiratory infection (URI)     SURGICAL  HISTORY: Past Surgical History:  Procedure Laterality Date  . BREAST LUMPECTOMY WITH RADIOACTIVE SEED AND SENTINEL LYMPH NODE BIOPSY Left 11/21/2020   Procedure: LEFT BREAST LUMPECTOMY WITH RADIOACTIVE SEED AND LEFT SENTINEL LYMPH NODE [place];  Surgeon: Erroll Luna, MD;  Location: [place];  Service: General;  Laterality: Left;  PEC BLOCK; START TIME OF 11:00 AM FOR 90 MINUTES IN ROOM 2  . BRONCHOSCOPY    . CATARACT EXTRACTION, BILATERAL  2016  . EYE SURGERY    . INSERTION OF IBV VALVE Left 04/16/2018   Procedure: INSERTION OF INTERBRONCHIAL VALVE (IBV);  Surgeon: [name], MD;  Location: [place];  Service: Thoracic;  Laterality: Left;  . INSERTION OF IBV VALVE N/A 06/25/2018    Procedure: REMOVAL OF THREE INTERBRONCHIAL VALVE (IBV);  Surgeon: [name], MD;  Location: [place];  Service: Thoracic;  Laterality: N/A;  . LOBECTOMY  12/17/2011   Procedure: LOBECTOMY;  Surgeon: [name], MD;  Location: [place];  Service: Thoracic;  Laterality: Left;  (L)VATS, WEDGE RESECTION, LEFT UPPER LOBECTOMY,  Multiple node biopsies  . PORTACATH PLACEMENT Left 11/21/2020   Procedure: INSERTION PORT-A-CATH;  Surgeon: Erroll Luna, MD;  Location: [place];  Service: General;  Laterality: Left;  . REMOVAL OF PLEURAL DRAINAGE CATHETER Left 05/06/2018   Procedure: REMOVAL OF CHEST TUBE;  Surgeon: [name], MD;  Location: [place];  Service: Thoracic;  Laterality: Left;  . THYROID SURGERY  1980   1/2 thyroid removed - benign  . URETHRA SURGERY    . VIDEO ASSISTED THORACOSCOPY Left 04/08/2018   Procedure: REDO LEFT VIDEO ASSISTED THORACOSCOPY with left lower lobe wedge resection;  Surgeon: [name], MD;  Location: [place];  Service: Thoracic;  Laterality: Left;  Marland Kitchen VIDEO BRONCHOSCOPY N/A 04/16/2018   Procedure: VIDEO BRONCHOSCOPY;  Surgeon: [name], MD;  Location: [place];  Service: Thoracic;  Laterality: N/A;  . VIDEO BRONCHOSCOPY N/A 06/25/2018   Procedure: VIDEO BRONCHOSCOPY;  Surgeon: [name], MD;  [facility];  Service: Thoracic;  Laterality: N/A;    I have reviewed the social history and family history with the patient and they are unchanged from previous note.  ALLERGIES:  has No Known Allergies.  MEDICATIONS:  Current Outpatient Medications  Medication Sig Dispense Refill  . acetaminophen (TYLENOL) 500 MG tablet Take 1,000 mg by mouth every 6 (six) hours as needed for moderate pain or headache.    Marland Kitchen acyclovir ointment (ZOVIRAX) 5 % Apply 1 application topically every 3 (three) hours as needed. (Patient taking differently: Apply 1 application topically every 3 (three) hours as needed (cold sores).) 15 g 0  . albuterol  (VENTOLIN HFA) 108 (90 Base) MCG/ACT inhaler Inhale 2 puffs into the lungs every 4 (four) hours as needed for wheezing or shortness of breath. For shortness of breath 8.51 g 5  . amLODipine (NORVASC) 10 MG tablet Take 10 mg by mouth daily.    Marland Kitchen BEVESPI AEROSPHERE 9-4.8 MCG/ACT AERO INHALE 2 PUFFS INTO THE LUNGS 2 TIMES DAILY. (Patient taking differently: Inhale 2 puffs into the lungs in the morning and at bedtime.) 10.7 g 11  . colestipol (COLESTID) 1 g tablet Take 1 g by mouth daily as needed (diarrhea).    Marland Kitchen HYDROcodone-acetaminophen (NORCO/VICODIN) 5-325 MG tablet Take 1 tablet by mouth every 6 (six) hours as needed for moderate pain. (Patient not taking: No sig reported) 15 tablet 0  . montelukast (SINGULAIR) 10 MG tablet Take 1 tablet (10 mg total)  by mouth at bedtime. 30 tablet 5  . oxyCODONE (OXY IR/ROXICODONE) 5 MG immediate release tablet Take 1 tablet (5 mg total) by mouth every 4 (four) hours as needed for severe pain. 30 tablet 0  . sertraline (ZOLOFT) 50 MG tablet Take 50 mg by mouth at bedtime.     No current facility-administered medications for this visit.    PHYSICAL EXAMINATION: ECOG PERFORMANCE STATUS: 2 - Symptomatic, <50% confined to bed  Vitals:   12/28/20 1156  BP: 125/72  Pulse: 97  Resp: 20  Temp: 97.7 F (36.5 C)  SpO2: 92%   Filed Weights   12/28/20 1156  Weight: 117 lb 4.8 oz (53.2 kg)    GENERAL:alert, no distress and comfortable SKIN: skin color, texture, turgor are normal, no rashes or significant lesions EYES: normal, Conjunctiva are pink and non-injected, sclera clear  NECK: supple, thyroid normal size, non-tender, without nodularity LYMPH: 1 cm nodule on left clavicle, palpable left supraclavicular lymphadenopathy LUNGS: clear to auscultation and percussion with normal breathing effort HEART: regular rate & rhythm and no murmurs and no lower extremity edema ABDOMEN:abdomen soft, non-tender and normal bowel sounds Musculoskeletal:no cyanosis of  digits and no clubbing  NEURO: alert & oriented x 3 with fluent speech, no focal motor/sensory deficits  LABORATORY DATA:  I have reviewed the data as listed CBC Latest Ref Rng & Units 06/14/2019 11/30/2018 06/17/2018  WBC 4.0 - 10.5 K/uL 7.0 9.1 8.6  Hemoglobin 12.0 - 15.0 g/dL 15.8(H) 15.0 13.7  Hematocrit 36.0 - 46.0 % 46.9(H) 45.9 42.5  Platelets 150 - 400 K/uL 290 312 407(H)     CMP Latest Ref Rng & Units 06/14/2019 11/30/2018 06/17/2018  Glucose 70 - 99 mg/dL 85 94 90  BUN 8 - 23 mg/dL 10 19 19   Creatinine 0.44 - 1.00 mg/dL 0.69 1.02(H) 0.60  Sodium 135 - 145 mmol/L 137 139 136  Potassium 3.5 - 5.1 mmol/L 3.5 4.5 3.6  Chloride 98 - 111 mmol/L 102 106 105  CO2 22 - 32 mmol/L 23 23 23   Calcium 8.9 - 10.3 mg/dL 8.6(L) 8.8(L) 9.0  Total Protein 6.5 - 8.1 g/dL 7.0 7.2 6.6  Total Bilirubin 0.3 - 1.2 mg/dL 0.6 0.3 0.4  Alkaline Phos 38 - 126 U/L 122 130(H) 108  AST 15 - 41 U/L 18 15 20   ALT 0 - 44 U/L 14 15 14       RADIOGRAPHIC STUDIES: I have personally reviewed the radiological images as listed and agreed with the findings in the report. No results found.   ASSESSMENT & PLAN:  KIRI HINDERLITER is a 70 y.o. female with   1. Metastatic Lung Adenocarcinoma -Chest CT 12/11/20 showed a large soft tissue mass in the left apical portion of the lung, with direct invasion of the left upper thoracic spine, probable pleural metastasis and lymph edetic metastasis in left lung, enlarged anterior mediastinal and left supraclavicular lymph nodes, compatible with metastatic disease. -This is likely recurrent metastatic lung cancer, with high tumor burden. -Left supraclavicular lymph node biopsy 12/22/20 confirmed metastatic adenocarcinoma -PET scan 12/27/20 showed: large hypermetabolic mass occupying left lung apex consistent with recurrent neoplasm; extensive metastatic disease involving chest, abdomen, pelvis, bony structures, muscles, and subcutaneous tissues. I discussed these results with them  extensively today. -Plan to send her biopsies tissue for genomic testing [facility] and PD-L1 testing to see if she is a candidate for targeted therapy and/or immunotherapy. -We discussed the option of first line chemo therapy today and related side effects.  I recommend carboplatin and taxol every 3 weeks, which is common regimen for both NSCLC and triple negative breast cancer   --Chemotherapy consent: Side effects including but does not not limited to, fatigue, nausea, vomiting, diarrhea, hair loss, neuropathy, fluid retention, renal and kidney dysfunction, neutropenic fever, needed for blood transfusion, bleeding, were discussed with patient in great detail. She agrees to proceed. -the goal of chemo is palliative  -I will ask pathology Dr. Vic Ripper to compare her breast tumor and the [facility] node biopsy to see if they are same or different tumor  -She denies numbness/tingling at baseline. -She has not had any recent blood work. We will obtain this today.  2. Pain in right scapula secondary to #1 -She has pain to her left scapula, rated 5-6/10 today (12/28/20), at location of metastatic tumor -She met with Dr. Isidore Moos on 12/13/20 to discuss possible palliative radiation therapy. They will hold off on this for now. -I prescribed oxycodone on 12/12/20, but she never started this. She has been taking a tramadol at night and Advil (2 pills, TID) during the day.  -I will refill her tramadol today per her request. -I recommended she cut back the amount of Advil she is taking.  3. Malignant neoplasm of upper inner quadrant of left breast,invasive adenocarcinomaStageIB, p1cN0Mx, ER-/PR-/HER2-, Grade III -She palpated her breast mass herself in mid January. Her mammogram showeda1.2cm mass in the 11:00 position of her left breast. Biopsy showed invasive ductal carcinoma, triple negative -She underwent left breast lumpectomy and sentinel lymph node biopsy on 3/8.  Her surgical pathology revealed a 1.3 cm  grade 3 invasive ductal carcinoma, with negative lymph nodes.  The anterior resection margin was positive.  3. History of LULstage IA, T1 a, N0, M0) non-small cell lung cancer, adenocarcinomaDxin 2013 + LLLstage Ib (T2, and 0, M0) non-small cell lung cancer, adenocarcinomaDx 02/2018.  4. COPD and HTN  -She quit smoking in 2013 after she was diagnosed with lung cancer.  -She has SOB and uses inhaler daily. Not on oxygen.    Plan -I refilled her tramadol today. -Proceed to labs today -We will contact her once we have results from our pathologist. -plan to start chemo Carbo and Taxol in about 2 weeks    No problem-specific Assessment & Plan notes found for this encounter.   No orders of the defined types were placed in this encounter.  All questions were answered. The patient knows to call the clinic with any problems, questions or concerns. No barriers to learning was detected. The total time spent in the appointment was 40 minutes.     Truitt Merle, MD 12/27/2020   I, Wilburn Mylar, am acting as scribe for Truitt Merle, MD.   I have reviewed the above documentation for accuracy and completeness, and I agree with the above.

## 2020-12-28 ENCOUNTER — Inpatient Hospital Stay: Payer: Medicare Other

## 2020-12-28 ENCOUNTER — Inpatient Hospital Stay: Payer: Medicare Other | Attending: Nurse Practitioner | Admitting: Hematology

## 2020-12-28 ENCOUNTER — Ambulatory Visit: Payer: Medicare Other | Admitting: Hematology

## 2020-12-28 VITALS — BP 125/72 | HR 97 | Temp 97.7°F | Resp 20 | Ht 64.0 in | Wt 117.3 lb

## 2020-12-28 DIAGNOSIS — C3490 Malignant neoplasm of unspecified part of unspecified bronchus or lung: Secondary | ICD-10-CM | POA: Insufficient documentation

## 2020-12-28 DIAGNOSIS — C3492 Malignant neoplasm of unspecified part of left bronchus or lung: Secondary | ICD-10-CM

## 2020-12-28 DIAGNOSIS — C50212 Malignant neoplasm of upper-inner quadrant of left female breast: Secondary | ICD-10-CM | POA: Diagnosis not present

## 2020-12-28 DIAGNOSIS — Z5111 Encounter for antineoplastic chemotherapy: Secondary | ICD-10-CM | POA: Insufficient documentation

## 2020-12-28 DIAGNOSIS — Z5189 Encounter for other specified aftercare: Secondary | ICD-10-CM | POA: Diagnosis not present

## 2020-12-28 DIAGNOSIS — C7951 Secondary malignant neoplasm of bone: Secondary | ICD-10-CM | POA: Diagnosis not present

## 2020-12-28 DIAGNOSIS — Z171 Estrogen receptor negative status [ER-]: Secondary | ICD-10-CM | POA: Insufficient documentation

## 2020-12-28 DIAGNOSIS — C3412 Malignant neoplasm of upper lobe, left bronchus or lung: Secondary | ICD-10-CM | POA: Diagnosis not present

## 2020-12-28 DIAGNOSIS — Z79899 Other long term (current) drug therapy: Secondary | ICD-10-CM | POA: Diagnosis not present

## 2020-12-28 DIAGNOSIS — J449 Chronic obstructive pulmonary disease, unspecified: Secondary | ICD-10-CM

## 2020-12-28 DIAGNOSIS — C770 Secondary and unspecified malignant neoplasm of lymph nodes of head, face and neck: Secondary | ICD-10-CM | POA: Insufficient documentation

## 2020-12-28 DIAGNOSIS — Z5112 Encounter for antineoplastic immunotherapy: Secondary | ICD-10-CM | POA: Diagnosis not present

## 2020-12-28 DIAGNOSIS — Z87891 Personal history of nicotine dependence: Secondary | ICD-10-CM | POA: Diagnosis not present

## 2020-12-28 LAB — CBC WITH DIFFERENTIAL (CANCER CENTER ONLY)
Abs Immature Granulocytes: 0.04 10*3/uL (ref 0.00–0.07)
Basophils Absolute: 0.1 10*3/uL (ref 0.0–0.1)
Basophils Relative: 1 %
Eosinophils Absolute: 0.2 10*3/uL (ref 0.0–0.5)
Eosinophils Relative: 2 %
HCT: 39.1 % (ref 36.0–46.0)
Hemoglobin: 13.1 g/dL (ref 12.0–15.0)
Immature Granulocytes: 0 %
Lymphocytes Relative: 12 %
Lymphs Abs: 1.2 10*3/uL (ref 0.7–4.0)
MCH: 29.6 pg (ref 26.0–34.0)
MCHC: 33.5 g/dL (ref 30.0–36.0)
MCV: 88.5 fL (ref 80.0–100.0)
Monocytes Absolute: 0.7 10*3/uL (ref 0.1–1.0)
Monocytes Relative: 6 %
Neutro Abs: 8.3 10*3/uL — ABNORMAL HIGH (ref 1.7–7.7)
Neutrophils Relative %: 79 %
Platelet Count: 373 10*3/uL (ref 150–400)
RBC: 4.42 MIL/uL (ref 3.87–5.11)
RDW: 13.2 % (ref 11.5–15.5)
WBC Count: 10.5 10*3/uL (ref 4.0–10.5)
nRBC: 0 % (ref 0.0–0.2)

## 2020-12-28 LAB — CMP (CANCER CENTER ONLY)
ALT: 11 U/L (ref 0–44)
AST: 18 U/L (ref 15–41)
Albumin: 3.5 g/dL (ref 3.5–5.0)
Alkaline Phosphatase: 153 U/L — ABNORMAL HIGH (ref 38–126)
Anion gap: 11 (ref 5–15)
BUN: 15 mg/dL (ref 8–23)
CO2: 25 mmol/L (ref 22–32)
Calcium: 8.8 mg/dL — ABNORMAL LOW (ref 8.9–10.3)
Chloride: 98 mmol/L (ref 98–111)
Creatinine: 0.59 mg/dL (ref 0.44–1.00)
GFR, Estimated: >60 mL/min (ref >60)
Glucose, Bld: 86 mg/dL (ref 70–99)
Potassium: 3.2 mmol/L — ABNORMAL LOW (ref 3.5–5.1)
Sodium: 134 mmol/L — ABNORMAL LOW (ref 135–145)
Total Bilirubin: 0.4 mg/dL (ref 0.3–1.2)
Total Protein: 7.1 g/dL (ref 6.5–8.1)

## 2020-12-28 MED ORDER — TRAMADOL HCL 50 MG PO TABS: 50.0000 mg | ORAL_TABLET | Freq: Four times a day (QID) | ORAL | 0 refills | Status: DC | PRN

## 2020-12-28 NOTE — Progress Notes (Signed)
START ON PATHWAY REGIMEN - Non-Small Cell Lung     A cycle is every 21 days:     Paclitaxel      Carboplatin      Bevacizumab-xxxx   **Always confirm dose/schedule in your pharmacy ordering system**  Patient Characteristics: Stage IV Metastatic, Nonsquamous, Awaiting Molecular Test Results and Need to Start Chemotherapy, PS = 0, 1 Therapeutic Status: Stage IV Metastatic Histology: Nonsquamous Cell Broad Molecular Profiling Status: Awaiting Molecular Test Results and Need to Start Chemotherapy ECOG Performance Status: 1 Intent of Therapy: Non-Curative / Palliative Intent, Discussed with Patient

## 2021-01-03 ENCOUNTER — Other Ambulatory Visit: Payer: Medicare Other

## 2021-01-04 ENCOUNTER — Encounter: Payer: Self-pay | Admitting: Hematology

## 2021-01-05 ENCOUNTER — Telehealth: Payer: Self-pay | Admitting: Hematology

## 2021-01-05 NOTE — Progress Notes (Signed)
[facility]   [phone] [phone]   Clinic Follow up Note   Patient Care Team: [name], MD as PCP - General (Family Medicine) Collene Gobble, MD (Pulmonary Disease) Erroll Luna, MD as Consulting Physician (General Surgery) Truitt Merle, MD as Consulting Physician (Hematology)  Date of Service:  01/08/2021  CHIEF COMPLAINT: f/u of metastatic lung cancer  SUMMARY OF ONCOLOGIC HISTORY: Oncology History Overview Note  Cancer Staging Malignant neoplasm of upper-inner quadrant of left breast in female, estrogen receptor negative ([place]) Staging form: Breast, AJCC 8th Edition - Clinical stage from 11/01/2020: Stage IB (cT1c, cN0, cM0, G3, ER-, PR-, HER2-) - Signed by Truitt Merle, MD on 11/08/2020 Stage prefix: Initial diagnosis - Pathologic stage from 11/21/2020: Stage IB (pT1c, pN0, cM0, G3, ER-, PR-, HER2-) - Signed by [name], NP on 12/20/2020 Stage prefix: Initial diagnosis Histologic grading system: 3 grade system  Non-small cell lung cancer ([place]) Staging form: Lung, AJCC 7th Edition - Clinical: Stage IA (Free text: Ia) - Signed by [name], MD on 12/28/2013 Laterality: Left Cancer stage: Ia - Pathologic: Ia - Signed by [name], MD on 12/28/2013 Laterality: Left Cancer stage: Ia    Non-small cell lung cancer ([place])  09/02/2011 Imaging   CT Chest  IMPRESSION:   1.  Interval clearing of right upper lobe pneumonia.  2.  Left upper lobe nodule is unchanged in the short interval from  07/29/2011.  Follow-up could be performed in 3 months to ensure  continued stability. This recommendation follows the consensus  statement: Guidelines for Management of Small Pulmonary Nodules  Detected on CT Scans:  A Statement from the [place] as  published in Radiology 2005; 237:395-400.  Available online at:  [email or web address]  3.  Additional scattered pulmonary  nodules can be reevaluated on  future imaging as well.    11/06/2011 Imaging   PET IMPRESSION:   1.  Mild hypermetabolism which projects minimally cephalad to, but  is felt to correspond to the left upper lobe lung nodule.  Although  not within malignant range, given the small size of the nodule,  malignancy cannot be excluded.  Possible mild interval enlargement  of the nodule since 09/02/2011.  Consider repeat standard chest CT  to confirm interval enlargement.  Especially if interval  enlargement has occurred, tissue sampling would be suggested.  Alternatively, 11-monthfollow-up chest CT could be performed.  2.  No evidence of thoracic nodal or extrathoracic disease.  3. Vague hypermetabolism corresponding to a prominent right lobe of  the thyroid. Nonspecific.  Consider ultrasound correlation.IMPRESSION:   1.  Mild hypermetabolism which projects minimally cephalad to, but  is felt to correspond to the left upper lobe lung nodule.  Although  not within malignant range, given the small size of the nodule,  malignancy cannot be excluded.  Possible mild interval enlargement  of the nodule since 09/02/2011.  Consider repeat standard chest CT  to confirm interval enlargement.  Especially if interval  enlargement has occurred, tissue sampling would be suggested.  Alternatively, 319-monthollow-up chest CT could be performed.  2.  No evidence of thoracic nodal or extrathoracic disease.  3. Vague hypermetabolism corresponding to a prominent right lobe of  the thyroid. Nonspecific.  Consider ultrasound correlation.   12/17/2011 Surgery   Thoracoscopy by Dr [name] Diagnosis 1. Lung, wedge biopsy/resection, left upper lobe - INVASIVE WELL-DIFFERENTIATED ADENOCARCINOMA, SPANNING 1.2 CM. - NO LYMPH VASCULAR INVASION IDENTIFIED. - PLEURA IS UNINVOLVED. -  MARGINS ARE NEGATIVE. - SEE ONCOLOGY TEMPLATE. 2. Lymph node, biopsy, level 9 - ONE BENIGN LYMPH NODE WITH NO TUMOR SEEN  (0/1). 3. Lymph node, biopsy, level 9 - ONE BENIGN LYMPH NODE WITH NO TUMOR SEEN (0/1). 4. Lymph node, biopsy, 11 - ONE BENIGN LYMPH NODE WITH NO TUMOR SEEN (0/1). 5. Lymph node, biopsy, 11 #2 - ONE BENIGN LYMPH NODE WITH NO TUMOR SEEN (0/1). 6. Lymph node, biopsy, 5 - ONE BENIGN LYMPH NODE WITH NO TUMOR SEEN (0/1). 7. Lymph node, biopsy, 10 - ONE BENIGN LYMPH NODE WITH NO TUMOR SEEN (0/1). 8. Lymph node, biopsy, 11 #3 - ONE BENIGN LYMPH NODE WITH NO TUMOR SEEN (0/1). 9. Lung, resection (segmental or lobe), Remainder of left upper lobe - BENIGN LUNG PARENCHYMA WITH CHRONIC INFLAMMATION AND INCREASED PULMONARY MACROPHAGES. - THREE BENIGN LYMPH NODES IDENTIFIED (0/3). - NO TUMOR SEEN.    01/08/2012 Initial Diagnosis   Non-small cell lung cancer ([place])   02/10/2018 Imaging   CT Chest IMPRESSION: 1. Enlarging irregular pleural-based solid 1.9 cm pulmonary nodule in the superior segment left lower lobe, suspicious for malignancy, either recurrent disease or a metachronous primary bronchogenic carcinoma. Consider PET-CT for further characterization. 2. Additional scattered bilateral pulmonary nodules are stable and considered benign. 3. No noncontrast CT evidence of thoracic adenopathy. 4. Three-vessel coronary atherosclerosis. 5. Nonobstructing left nephrolithiasis.   Aortic Atherosclerosis (ICD10-I70.0) and Emphysema (ICD10-J43.9).   02/18/2018 PET scan   IMPRESSION: 1. Hypermetabolic apical nodule in the superior segment left lower lobe is highly worrisome for bronchogenic carcinoma. 2. No additional areas of abnormal hypermetabolism in the neck, chest, abdomen or pelvis. 3. Aortic atherosclerosis (ICD10-170.0). Coronary artery calcification. 4.  Emphysema (ICD10-J43.9). 5. Left renal stone.     03/06/2018 Pathology Results   Lung Biopsy  Diagnosis Lung, needle/core biopsy(ies), Left Lower Lobe - ADENOCARCINOMA. Microscopic Comment There is likely sufficient tissue for  additional studies if requested (block 1B). Dr. Lyndon Code has reviewed the case.   04/08/2018 Surgery   Left Video assisted Thoracoscopy by Dr Roxan Hockey  Diagnosis Lung, wedge biopsy/resection, Left Lower Lobe - INVASIVE ADENOCARCINOMA, MODERATELY DIFFERENTIATED, SPANNING 1.3 CM. - ADENOCARCINOMA INVOLVES VISCERAL PLEURA. - ONE BENIGN INTRAPULMONARY LYMPH NODE (0/1). - LYMPHOVASCULAR INVASION IS IDENTIFIED, FOCAL. - THE SURGICAL RESECTION MARGINS ARE NEGATIVE FOR CARCINOMA. - SEE ONCOLOGY TABLE BELOW.    08/01/2020 Imaging   CT Chest  IMPRESSION: Stable postop changes from prior left upper lobectomy. Stable small bilateral pulmonary nodules. No new or progressive disease within the thorax.   Aortic Atherosclerosis (ICD10-I70.0) and Emphysema (ICD10-J43.9).   Malignant neoplasm of upper-inner quadrant of left breast in female, estrogen receptor negative ([place])  10/18/2020 Mammogram   Mammogram 10/18/20 at [facility] FINDINGS:  Cc and MLO views of bilateral breasts are submitted. There is a  spiculated mass in the palpable area upper left breast. The right  breast is negative.   Targeted ultrasound is performed, showing 1.2 x 0.9 x 1.2 cm  spiculated hypoechoic mass at the left breast 11 o'clock 8 cm from  nipple palpable area. This correlates to the mammographic mass.  Ultrasound of the left axilla is negative.   IMPRESSION:  Highly suspicious findings.     10/26/2020 Initial Biopsy   Final Pathologic Diagnosis  10/26/20 at [facility] BREAST, LEFT 1:00 8 CM FROM NIPPLE, NEEDLE BIOPSY:              INVASIVE DUCTAL CARCINOMA, NOTTINGHAM GRADE 3. Comment   The Nottingham grade is 3 (3-tubule formation, 3-nuclear atypia, 3-mitoses).  This case was reviewed by Dr. Vicenta Dunning and Valerie Mosley is in essential agreement with the above diagnosis.  Final Pathologic Diagnosis   Prognostic Markers in Cancer   Block Number:  [id number] Diagnosis:  Invasive ductal carcinoma     Results:   Estrogen  Receptor:  Negative (<1%)   Progesterone Receptor:  Negative (<1%)   Her2:  Negative (score=1+)     Ki-67:  Percentage of tumor cells with nuclear positivity: 60 %      11/01/2020 Cancer Staging   Staging form: Breast, AJCC 8th Edition - Clinical stage from 11/01/2020: Stage IB (cT1c, cN0, cM0, G3, ER-, PR-, HER2-) - Signed by Truitt Merle, MD on 11/08/2020 Stage prefix: Initial diagnosis   11/08/2020 Initial Diagnosis   Malignant neoplasm of upper-inner quadrant of left breast in female, estrogen receptor negative ([place])   11/21/2020 Surgery   LEFT BREAST LUMPECTOMY WITH RADIOACTIVE SEED AND LEFT SENTINEL LYMPH NODE [place] and PAC placement by Dr Brantley Stage    11/21/2020 Pathology Results   FINAL MICROSCOPIC DIAGNOSIS:   A. BREAST, LEFT, LUMPECTOMY:  - Invasive ductal carcinoma, grade 3, 1.3 cm  - The anterior resection margin is widely involved by carcinoma  - Carcinoma is 0.2 cm from posterior margin  - Biopsy site changes  - See oncology table   B. BREAST, LEFT ADDITIONAL SUPERIOR MARGIN, EXCISION:  - Unremarkable fibroadipose tissue, negative for carcinoma   C. BREAST, LEFT ADDITIONAL LATERAL MARGIN, EXCISION:  - Unremarkable fibroadipose tissue, negative for carcinoma   D. BREAST, LEFT ADDITIONAL INFEROMEDIAL MARGIN, EXCISION:  - Unremarkable fibroadipose tissue, negative for carcinoma   E. LYMPH NODE, LEFT AXILLARY, SENTINEL, EXCISION:  - Lymph node, negative for carcinoma (0/1)    11/21/2020 Cancer Staging   Staging form: Breast, AJCC 8th Edition - Pathologic stage from 11/21/2020: Stage IB (pT1c, pN0, cM0, G3, ER-, PR-, HER2-) - Signed by [name], NP on 12/20/2020 Stage prefix: Initial diagnosis Histologic grading system: 3 grade system   Metastatic lung cancer (metastasis from lung to other site), left ([place])  12/11/2020 Imaging   CT Chest  IMPRESSION: 1. Signs of tumor recurrence within the apical portion of the left hemithorax with large rind of increased  soft tissue. Tumor extends into the left upper thoracic spine paravertebral soft tissues. 2. Pleural spread of disease is noted with mild progressive pleural thickening overlying the posterior and lateral left lower lung. 3. Interval development of increased interstitial markings within the left upper lobe adjacent to mass. In the absence of interval external beam radiation (since 08/01/2020) this is suspicious for lymphangitic spread of tumor. 4. Progressive soft tissue mass the within the paravertebral right upper lobe concerning for metastatic disease. 5. New enlarged anterior mediastinal and left supraclavicular lymph nodes compatible with metastatic adenopathy. 6. Indeterminate soft tissue attenuating nodular density between the upper pole of left kidney and left adrenal gland. Cannot exclude nodal metastasis or adrenal gland metastasis. 7. Aortic atherosclerosis.   Aortic Atherosclerosis (ICD10-I70.0).   12/22/2020 Pathology Results   A. LYMPH NODE, LEFT NECK, NEEDLE CORE BIOPSY:  - Metastatic adenocarcinoma.   COMMENT:   CK7 is positive.  CK20, TTF-1, Napsin-A, CDX-2, PAX 8, GATA-3 and ER are  negative.  The limited CK7 positive staining is compatible with origin  from lung; however, it does not exclude origin from other entities. If  applicable, there is likely sufficient tissue for ancillary studies.  Dr. Saralyn Pilar reviewed the case.    12/27/2020 Imaging   PET scan IMPRESSION: 1. Large  hypermetabolic mass occupying the left lung apex consistent with recurrent neoplasm. 2. Extensive metastatic disease involving the chest, abdomen, pelvis, bony structures, muscles and subcutaneous tissues.   01/07/2021 Initial Diagnosis   Metastatic lung cancer (metastasis from lung to other site), left ([place])   01/08/2021 -  Chemotherapy   First-line Carboplatin and Taxol with Bevacuzimab q3weeks starting 01/08/21      CURRENT THERAPY:  First-line Carboplatin and Taxol with  Bevacuzimab q3weeks starting 01/08/21  INTERVAL HISTORY:  Valerie Mosley is here for a follow up. Valerie Mosley was last seen by me 12/28/20. Valerie Mosley presents to the clinic  with her son. Valerie Mosley notes Valerie Mosley is willing to proceed with chemo after her chemo education class this morning. Valerie Mosley notes her pain has increased any no longer manageable. For pain Valerie Mosley has used Advil which helps the pain more than Norco or Oxycodone. Valerie Mosley has tried Oxycodone and required 4 tabs which is not enough. Valerie Mosley notes her pain wakes her up at night. Valerie Mosley notes constipation last week. This resolved after Ducolax. Valerie Mosley notes Valerie Mosley is not eating week due to taste change and nausea.    REVIEW OF SYSTEMS:   Constitutional: Denies fevers, chills or abnormal weight loss (+) Low appetite, taste change Eyes: Denies blurriness of vision Ears, nose, mouth, throat, and face: Denies mucositis or sore throat Respiratory: Denies cough, dyspnea or wheezes Cardiovascular: Denies palpitation, chest discomfort or lower extremity swelling Gastrointestinal:  Denies nausea, heartburn or change in bowel habits Skin: Denies abnormal skin rashes MSK: (+) Right scapula pain increase  Lymphatics: Denies new lymphadenopathy or easy bruising Neurological:Denies numbness, tingling or new weaknesses Behavioral/Psych: Mood is stable, no new changes  All other systems were reviewed with the patient and are negative.  MEDICAL HISTORY:  Past Medical History:  Diagnosis Date  . Breast cancer ([place])   . Cancer ([place])    Stage IA non-small cell lung cancer, left upper lobectomy 12/2011  . COPD (chronic obstructive pulmonary disease) ([place])   . Cough 07/29/11   started coughing up blood this am  . Cough   . Hypertension   . Kyphoscoliosis   . Pneumonia   . PONV (postoperative nausea and vomiting)   . Recurrent upper respiratory infection ([place])     SURGICAL HISTORY: Past Surgical History:  Procedure Laterality Date  . BREAST LUMPECTOMY WITH RADIOACTIVE SEED AND  SENTINEL LYMPH NODE BIOPSY Left 11/21/2020   Procedure: LEFT BREAST LUMPECTOMY WITH RADIOACTIVE SEED AND LEFT SENTINEL LYMPH NODE [place];  Surgeon: Erroll Luna, MD;  Location: [facility];  Service: General;  Laterality: Left;  PEC BLOCK; START TIME OF 11:00 AM FOR 90 MINUTES IN ROOM 2  . BRONCHOSCOPY    . CATARACT EXTRACTION, BILATERAL  2016  . EYE SURGERY    . INSERTION OF IBV VALVE Left 04/16/2018   Procedure: INSERTION OF INTERBRONCHIAL VALVE (IBV);  Surgeon: [name], MD;  Location: [facility];  Service: Thoracic;  Laterality: Left;  . INSERTION OF IBV VALVE N/A 06/25/2018   Procedure: REMOVAL OF THREE INTERBRONCHIAL VALVE (IBV);  Surgeon: [name], MD;  Location: [facility];  Service: Thoracic;  Laterality: N/A;  . LOBECTOMY  12/17/2011   Procedure: LOBECTOMY;  Surgeon: [name], MD;  Location: [facility];  Service: Thoracic;  Laterality: Left;  (L)VATS, WEDGE RESECTION, LEFT UPPER LOBECTOMY,  Multiple node biopsies  . PORTACATH PLACEMENT Left 11/21/2020   Procedure: INSERTION PORT-A-CATH;  Surgeon: Erroll Luna, MD;  Location: [facility];  Service: General;  Laterality: Left;  .  REMOVAL OF PLEURAL DRAINAGE CATHETER Left 05/06/2018   Procedure: REMOVAL OF CHEST TUBE;  Surgeon: [name], MD;  Location: [facility];  Service: Thoracic;  Laterality: Left;  . THYROID SURGERY  1980   1/2 thyroid removed - benign  . URETHRA SURGERY    . VIDEO ASSISTED THORACOSCOPY Left 04/08/2018   Procedure: REDO LEFT VIDEO ASSISTED THORACOSCOPY with left lower lobe wedge resection;  Surgeon: [name], MD;  Location: [facility];  Service: Thoracic;  Laterality: Left;  Marland Kitchen VIDEO BRONCHOSCOPY N/A 04/16/2018   Procedure: VIDEO BRONCHOSCOPY;  Surgeon: [name], MD;  Location: [facility] [place];  Service: Thoracic;  Laterality: N/A;  . VIDEO BRONCHOSCOPY N/A 06/25/2018   Procedure: VIDEO BRONCHOSCOPY;  Surgeon: [name], MD;  [facility];  Service: Thoracic;  Laterality: N/A;     I have reviewed the social history and family history with the patient and they are unchanged from previous note.  ALLERGIES:  has No Known Allergies.  MEDICATIONS:  Current Outpatient Medications  Medication Sig Dispense Refill  . mirtazapine (REMERON) 7.5 MG tablet Take 1 tablet (7.5 mg total) by mouth at bedtime. 30 tablet 0  . morphine (MS CONTIN) 15 MG 12 hr tablet Take 1 tablet (15 mg total) by mouth every 12 (twelve) hours. 30 tablet 0  . acetaminophen (TYLENOL) 500 MG tablet Take 1,000 mg by mouth every 6 (six) hours as needed for moderate pain or headache.    Marland Kitchen acyclovir ointment (ZOVIRAX) 5 % Apply 1 application topically every 3 (three) hours as needed. (Patient taking differently: Apply 1 application topically every 3 (three) hours as needed (cold sores).) 15 g 0  . albuterol (VENTOLIN HFA) 108 (90 Base) MCG/ACT inhaler Inhale 2 puffs into the lungs every 4 (four) hours as needed for wheezing or shortness of breath. For shortness of breath 8.51 g 5  . amLODipine (NORVASC) 10 MG tablet Take 10 mg by mouth daily.    Marland Kitchen BEVESPI AEROSPHERE 9-4.8 MCG/ACT AERO INHALE 2 PUFFS INTO THE LUNGS 2 TIMES DAILY. (Patient taking differently: Inhale 2 puffs into the lungs in the morning and at bedtime.) 10.7 g 11  . colestipol (COLESTID) 1 g tablet Take 1 g by mouth daily as needed (diarrhea).    . dexamethasone (DECADRON) 4 MG tablet Take 2 tablets (8 mg total) by mouth daily. Start the day after carboplatin chemotherapy for 3 days. 30 tablet 1  . lidocaine-prilocaine (EMLA) cream Apply to affected area once 30 g 3  . montelukast (SINGULAIR) 10 MG tablet Take 1 tablet (10 mg total) by mouth at bedtime. 30 tablet 5  . ondansetron (ZOFRAN) 8 MG tablet Take 1 tablet (8 mg total) by mouth 2 (two) times daily as needed for refractory nausea / vomiting. Start on day 3 after carboplatin chemo. 30 tablet 1  . oxyCODONE (OXY IR/ROXICODONE) 5 MG immediate release tablet Take 1 tablet (5 mg total) by mouth  every 4 (four) hours as needed for severe pain. 60 tablet 0  . prochlorperazine (COMPAZINE) 10 MG tablet Take 1 tablet (10 mg total) by mouth every 6 (six) hours as needed (Nausea or vomiting). 30 tablet 1  . sertraline (ZOLOFT) 50 MG tablet Take 50 mg by mouth at bedtime.    . traMADol (ULTRAM) 50 MG tablet Take 1 tablet (50 mg total) by mouth every 6 (six) hours as needed. 60 tablet 0   No current facility-administered medications for this visit.    PHYSICAL EXAMINATION: ECOG PERFORMANCE STATUS:  2 - Symptomatic, <50% confined to bed  Vitals:   01/08/21 1008  BP: 102/73  Pulse: 88  Resp: 18  Temp: 98.2 F (36.8 C)  SpO2: 97%   Filed Weights   01/08/21 1008  Weight: 112 lb 14.4 oz (51.2 kg)    Due to COVID19 we will limit examination to appearance. Patient had no complaints.  GENERAL:alert, no distress and comfortable SKIN: skin color normal, no rashes or significant lesions EYES: normal, Conjunctiva are pink and non-injected, sclera clear  NEURO: alert & oriented x 3 with fluent speech   LABORATORY DATA:  I have reviewed the data as listed CBC Latest Ref Rng & Units 01/08/2021 12/28/2020 06/14/2019  WBC 4.0 - 10.5 K/uL 9.7 10.5 7.0  Hemoglobin 12.0 - 15.0 g/dL 12.5 13.1 15.8(H)  Hematocrit 36.0 - 46.0 % 36.3 39.1 46.9(H)  Platelets 150 - 400 K/uL 408(H) 373 290     CMP Latest Ref Rng & Units 01/08/2021 12/28/2020 06/14/2019  Glucose 70 - 99 mg/dL 91 86 85  BUN 8 - 23 mg/dL 12 15 10   Creatinine 0.44 - 1.00 mg/dL 0.57 0.59 0.69  Sodium 135 - 145 mmol/L 131(L) 134(L) 137  Potassium 3.5 - 5.1 mmol/L 2.9(L) 3.2(L) 3.5  Chloride 98 - 111 mmol/L 90(L) 98 102  CO2 22 - 32 mmol/L 28 25 23   Calcium 8.9 - 10.3 mg/dL 8.8(L) 8.8(L) 8.6(L)  Total Protein 6.5 - 8.1 g/dL 6.8 7.1 7.0  Total Bilirubin 0.3 - 1.2 mg/dL 0.5 0.4 0.6  Alkaline Phos 38 - 126 U/L 125 153(H) 122  AST 15 - 41 U/L 18 18 18   ALT 0 - 44 U/L 9 11 14       RADIOGRAPHIC STUDIES: I have personally reviewed the  radiological images as listed and agreed with the findings in the report. No results found.   ASSESSMENT & PLAN:  Valerie Mosley is a 70 y.o. female with    1. Metastatic Lung Adenocarcinoma -Chest CT 12/11/20 showed a large soft tissue mass in the left apical portion of the lung, with direct invasion of the left upper thoracic spine,probable pleural metastasis and lymph edetic metastasis in left lung,enlarged anterior mediastinal and left supraclavicular lymph nodes,compatible with metastatic disease. -This is likely recurrent metastatic lung cancer,with high tumor burden. -Left supraclavicular lymph node biopsy on 12/22/20 confirmed metastatic adenocarcinoma -PET scan 12/27/20 showed: large hypermetabolic mass occupying left lung apex consistent with recurrent neoplasm; extensive metastatic disease involving chest, abdomen, pelvis, bony structures, muscles, and subcutaneous tissues.  -Her FO and PD-L1 test result from [facility] node biopsy is still pending.  -I discussed the comparison of her breast tumor and node biopsy by pathologist Dr. Vic Ripper is similar but not the exact same. I still have suspicion this is from the same cancer.  -I recommended first-line chemo with Carboplatin and Taxol with Bevacizumab q3weeks, which will cover both lung and breast cancer if Valerie Mosley has two primaries.  --Chemotherapy consent: Side effects including but does not not limited to, fatigue, nausea, vomiting, diarrhea, hair loss, neuropathy, fluid retention, renal and kidney dysfunction, neutropenic fever, needed for blood transfusion, bleeding, were discussed with patient in great detail. Valerie Mosley agrees to proceed. Valerie Mosley agreed and chemo consent obtained. Valerie Mosley will start today (01/08/21). Goal of therapy is to control disease and prolong her life. Will scan her in 3 months to monitor response.  -Labs reviewed, plt 408K, ANC 7.8, NA 131, K 2.9, Ca 8.8, Albumin 3.1. Overall adequate to proceed with start of Carbo/Taxol and  Beva  tomorrow.  -I discussed Valerie Mosley will also receive GCSF injection to help her blood counts. Valerie Mosley can use Claritin for 5 days after injection for bone pain. I encouraged her to watch for significant or unexpected side effects and contact the clinic if this presents.  -F/u next week for toxicity check.  -May add immunotherapy depends on her PD-L1 result   2. Pain in right scapula secondary to #1 -Valerie Mosley has pain to her left scapula, rated 5-6/10 on 12/28/20, at location of metastatic tumor -Dr. Isidore Moos discussed with patient in 11/3020 to hold on palliative Radiation for now.  -Her pain is not controlled on Tramadol, Norco or Oxycodone alone. I will call in long acting MS Contin 76m BID. Valerie Mosley can use oxycodone 520mas needed for breakthrough pain.  -Her pain has effects her sleep. Given her low appetite, I will call in Mirtazapine to help with both (01/08/21). Valerie Mosley is agreeable. I discussed while on Mirtazapine, Valerie Mosley can wean off Zoloft, Valerie Mosley will reduce dose by 50% first  -I discussed Valerie Mosley has a lot of sedative medications and to not take them all at the same time.  -I discussed with increase in pain medication, this can lead to constipation. I recommend Valerie Mosley use stool softener to make sure Valerie Mosley has a BM daily.   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 Her 10/2020 mammogram showeda1.2cm mass in the 11:00 position of her left breast. 10/26/20 Biopsy showed invasive ductal carcinoma, triple negative -Valerie Mosley underwent left breast lumpectomy and sentinel lymph node biopsyon 11/21/20.Her surgical pathology revealed a 1.3 cm grade 3 invasive ductal carcinoma, with negative lymph nodes. The anterior resection margin was positive. -I discussed chemo for her lung met will also treat her positive margins of breast cancer.   3. History ofLULstage IA, T1 a, N0, M0) non-small cell lung  cancer, adenocarcinomaDxin 2013 + LLLstage Ib (T2, and 0, M0) non-small cell lung cancer, adenocarcinomaDx 02/2018.  4. COPD and HTN -Valerie Mosley quit smoking in 2013 after Valerie Mosley was diagnosed with lung cancer.  -Valerie Mosley has SOB and uses inhaler daily. Not on oxygen.   Plan -I called in MS Contin and refilled oxycodone today, also called in Mirtazapine for her appetite   -C1 Carbo/Taxol and Beva tomorrow with dose reduction to carbo AUC 4, Taxol 15013m2, GCSF on day 3  -Lab, flush, F/u next week  -Lab, flush, F/u, Carbo/Taxol and Beva in 3 weeks     No problem-specific Assessment & Plan notes found for this encounter.   No orders of the defined types were placed in this encounter.  All questions were answered. The patient knows to call the clinic with any problems, questions or concerns. No barriers to learning was detected. The total time spent in the appointment was 40 minutes.     YanTruitt MerleD 01/08/2021   I, AmoJoslyn Devonm acting as scribe for YanTruitt MerleD.   I have reviewed the above documentation for accuracy and completeness, and I agree with the above.

## 2021-01-05 NOTE — Telephone Encounter (Signed)
Scheduled appts per 4/21 sch msg. Pt aware.

## 2021-01-07 DIAGNOSIS — C3492 Malignant neoplasm of unspecified part of left bronchus or lung: Secondary | ICD-10-CM | POA: Insufficient documentation

## 2021-01-08 ENCOUNTER — Inpatient Hospital Stay: Payer: Medicare Other

## 2021-01-08 ENCOUNTER — Inpatient Hospital Stay (HOSPITAL_BASED_OUTPATIENT_CLINIC_OR_DEPARTMENT_OTHER): Payer: Medicare Other | Admitting: Hematology

## 2021-01-08 ENCOUNTER — Other Ambulatory Visit: Payer: Self-pay

## 2021-01-08 ENCOUNTER — Ambulatory Visit: Payer: Medicare Other | Admitting: Emergency Medicine

## 2021-01-08 ENCOUNTER — Encounter: Payer: Self-pay | Admitting: Hematology

## 2021-01-08 ENCOUNTER — Other Ambulatory Visit: Payer: Self-pay | Admitting: Nurse Practitioner

## 2021-01-08 ENCOUNTER — Telehealth: Payer: Self-pay | Admitting: Hematology

## 2021-01-08 VITALS — BP 102/73 | HR 88 | Temp 98.2°F | Resp 18 | Ht 64.0 in | Wt 112.9 lb

## 2021-01-08 DIAGNOSIS — C50212 Malignant neoplasm of upper-inner quadrant of left female breast: Secondary | ICD-10-CM

## 2021-01-08 DIAGNOSIS — C3492 Malignant neoplasm of unspecified part of left bronchus or lung: Secondary | ICD-10-CM

## 2021-01-08 DIAGNOSIS — C770 Secondary and unspecified malignant neoplasm of lymph nodes of head, face and neck: Secondary | ICD-10-CM | POA: Diagnosis not present

## 2021-01-08 DIAGNOSIS — C7951 Secondary malignant neoplasm of bone: Secondary | ICD-10-CM | POA: Diagnosis not present

## 2021-01-08 DIAGNOSIS — Z171 Estrogen receptor negative status [ER-]: Secondary | ICD-10-CM | POA: Diagnosis not present

## 2021-01-08 DIAGNOSIS — C3412 Malignant neoplasm of upper lobe, left bronchus or lung: Secondary | ICD-10-CM | POA: Diagnosis not present

## 2021-01-08 DIAGNOSIS — Z5111 Encounter for antineoplastic chemotherapy: Secondary | ICD-10-CM | POA: Diagnosis not present

## 2021-01-08 DIAGNOSIS — Z5112 Encounter for antineoplastic immunotherapy: Secondary | ICD-10-CM | POA: Diagnosis not present

## 2021-01-08 LAB — CBC WITH DIFFERENTIAL (CANCER CENTER ONLY)
Abs Immature Granulocytes: 0.05 10*3/uL (ref 0.00–0.07)
Basophils Absolute: 0.1 10*3/uL (ref 0.0–0.1)
Basophils Relative: 1 %
Eosinophils Absolute: 0.1 10*3/uL (ref 0.0–0.5)
Eosinophils Relative: 1 %
HCT: 36.3 % (ref 36.0–46.0)
Hemoglobin: 12.5 g/dL (ref 12.0–15.0)
Immature Granulocytes: 1 %
Lymphocytes Relative: 11 %
Lymphs Abs: 1 10*3/uL (ref 0.7–4.0)
MCH: 29.6 pg (ref 26.0–34.0)
MCHC: 34.4 g/dL (ref 30.0–36.0)
MCV: 86 fL (ref 80.0–100.0)
Monocytes Absolute: 0.7 10*3/uL (ref 0.1–1.0)
Monocytes Relative: 8 %
Neutro Abs: 7.8 10*3/uL — ABNORMAL HIGH (ref 1.7–7.7)
Neutrophils Relative %: 78 %
Platelet Count: 408 10*3/uL — ABNORMAL HIGH (ref 150–400)
RBC: 4.22 MIL/uL (ref 3.87–5.11)
RDW: 12.8 % (ref 11.5–15.5)
WBC Count: 9.7 10*3/uL (ref 4.0–10.5)
nRBC: 0 % (ref 0.0–0.2)

## 2021-01-08 LAB — CMP (CANCER CENTER ONLY)
ALT: 9 U/L (ref 0–44)
AST: 18 U/L (ref 15–41)
Albumin: 3.1 g/dL — ABNORMAL LOW (ref 3.5–5.0)
Alkaline Phosphatase: 125 U/L (ref 38–126)
Anion gap: 13 (ref 5–15)
BUN: 12 mg/dL (ref 8–23)
CO2: 28 mmol/L (ref 22–32)
Calcium: 8.8 mg/dL — ABNORMAL LOW (ref 8.9–10.3)
Chloride: 90 mmol/L — ABNORMAL LOW (ref 98–111)
Creatinine: 0.57 mg/dL (ref 0.44–1.00)
GFR, Estimated: >60 mL/min (ref >60)
Glucose, Bld: 91 mg/dL (ref 70–99)
Potassium: 2.9 mmol/L — ABNORMAL LOW (ref 3.5–5.1)
Sodium: 131 mmol/L — ABNORMAL LOW (ref 135–145)
Total Bilirubin: 0.5 mg/dL (ref 0.3–1.2)
Total Protein: 6.8 g/dL (ref 6.5–8.1)

## 2021-01-08 MED ORDER — OXYCODONE HCL 5 MG PO TABS: 5.0000 mg | ORAL_TABLET | ORAL | 0 refills | Status: DC | PRN

## 2021-01-08 MED ORDER — ONDANSETRON HCL 8 MG PO TABS: 8.0000 mg | ORAL_TABLET | Freq: Two times a day (BID) | ORAL | 1 refills | Status: DC | PRN

## 2021-01-08 MED ORDER — SODIUM CHLORIDE 0.9% FLUSH
10.0000 mL | Freq: Once | INTRAVENOUS | Status: AC
Start: 2021-01-08 — End: 2021-01-08
  Administered 2021-01-08: 10 mL via INTRAVENOUS
  Filled 2021-01-08: qty 10

## 2021-01-08 MED ORDER — LIDOCAINE-PRILOCAINE 2.5-2.5 % EX CREA: TOPICAL_CREAM | CUTANEOUS | 3 refills | Status: AC

## 2021-01-08 MED ORDER — DEXAMETHASONE 4 MG PO TABS
8.0000 mg | ORAL_TABLET | Freq: Every day | ORAL | 1 refills | Status: AC
Start: 2021-01-08 — End: ?

## 2021-01-08 MED ORDER — PROCHLORPERAZINE MALEATE 10 MG PO TABS: 10.0000 mg | ORAL_TABLET | Freq: Four times a day (QID) | ORAL | 1 refills | Status: DC | PRN

## 2021-01-08 MED ORDER — LIDOCAINE-PRILOCAINE 2.5-2.5 % EX CREA
TOPICAL_CREAM | CUTANEOUS | Status: AC
  Filled 2021-01-08: qty 5

## 2021-01-08 MED ORDER — MORPHINE SULFATE ER 15 MG PO TBCR: 15.0000 mg | EXTENDED_RELEASE_TABLET | Freq: Two times a day (BID) | ORAL | 0 refills | Status: DC

## 2021-01-08 MED ORDER — HEPARIN SOD (PORK) LOCK FLUSH 100 UNIT/ML IV SOLN
500.0000 [IU] | Freq: Once | INTRAVENOUS | Status: AC
Start: 2021-01-08 — End: 2021-01-08
  Administered 2021-01-08: 500 [IU] via INTRAVENOUS
  Filled 2021-01-08: qty 5

## 2021-01-08 MED ORDER — MIRTAZAPINE 7.5 MG PO TABS: 7.5000 mg | ORAL_TABLET | Freq: Every day | ORAL | 0 refills | Status: DC

## 2021-01-08 NOTE — Telephone Encounter (Signed)
Scheduled follow-up appointment per 4/25 los. Patient is aware.

## 2021-01-08 NOTE — Progress Notes (Signed)
.  The following biosimilar Mvasi (bevacizumab-awwb)  has been selected for use in this patient.  The following biosimilar Ziextenzo (pegfilgrastim-bmez) has been selected for use in this patient.  Per insurance requirements.  Henreitta Leber, PharmD 01/08/21 @ 1230

## 2021-01-09 ENCOUNTER — Encounter: Payer: Self-pay | Admitting: General Practice

## 2021-01-09 ENCOUNTER — Inpatient Hospital Stay: Payer: Medicare Other

## 2021-01-09 VITALS — BP 106/70 | HR 96 | Temp 98.4°F | Resp 18 | Wt 111.0 lb

## 2021-01-09 DIAGNOSIS — C3412 Malignant neoplasm of upper lobe, left bronchus or lung: Secondary | ICD-10-CM | POA: Diagnosis not present

## 2021-01-09 DIAGNOSIS — C50212 Malignant neoplasm of upper-inner quadrant of left female breast: Secondary | ICD-10-CM | POA: Diagnosis not present

## 2021-01-09 DIAGNOSIS — C770 Secondary and unspecified malignant neoplasm of lymph nodes of head, face and neck: Secondary | ICD-10-CM | POA: Diagnosis not present

## 2021-01-09 DIAGNOSIS — C7951 Secondary malignant neoplasm of bone: Secondary | ICD-10-CM | POA: Diagnosis not present

## 2021-01-09 DIAGNOSIS — Z5111 Encounter for antineoplastic chemotherapy: Secondary | ICD-10-CM | POA: Diagnosis not present

## 2021-01-09 DIAGNOSIS — Z5112 Encounter for antineoplastic immunotherapy: Secondary | ICD-10-CM | POA: Diagnosis not present

## 2021-01-09 DIAGNOSIS — C3492 Malignant neoplasm of unspecified part of left bronchus or lung: Secondary | ICD-10-CM

## 2021-01-09 LAB — TOTAL PROTEIN, URINE DIPSTICK: Protein, ur: NEGATIVE mg/dL

## 2021-01-09 MED ORDER — DIPHENHYDRAMINE HCL 50 MG/ML IJ SOLN
INTRAMUSCULAR | Status: AC
  Filled 2021-01-09: qty 1

## 2021-01-09 MED ORDER — FAMOTIDINE IN NACL 20-0.9 MG/50ML-% IV SOLN
INTRAVENOUS | Status: AC
  Filled 2021-01-09: qty 50

## 2021-01-09 MED ORDER — DIPHENHYDRAMINE HCL 50 MG/ML IJ SOLN
50.0000 mg | Freq: Once | INTRAMUSCULAR | Status: AC
  Administered 2021-01-09: 50 mg via INTRAVENOUS

## 2021-01-09 MED ORDER — PALONOSETRON HCL INJECTION 0.25 MG/5ML
0.2500 mg | Freq: Once | INTRAVENOUS | Status: AC
  Administered 2021-01-09: 0.25 mg via INTRAVENOUS

## 2021-01-09 MED ORDER — SODIUM CHLORIDE 0.9 % IV SOLN
150.0000 mg | Freq: Once | INTRAVENOUS | Status: AC
  Administered 2021-01-09: 150 mg via INTRAVENOUS
  Filled 2021-01-09: qty 150

## 2021-01-09 MED ORDER — PACLITAXEL CHEMO INJECTION 300 MG/50ML
150.0000 mg/m2 | Freq: Once | INTRAVENOUS | Status: AC
Start: 2021-01-09 — End: 2021-01-09
  Administered 2021-01-09: 234 mg via INTRAVENOUS
  Filled 2021-01-09: qty 39

## 2021-01-09 MED ORDER — HEPARIN SOD (PORK) LOCK FLUSH 100 UNIT/ML IV SOLN
500.0000 [IU] | Freq: Once | INTRAVENOUS | Status: AC | PRN
  Administered 2021-01-09: 500 [IU]
  Filled 2021-01-09: qty 5

## 2021-01-09 MED ORDER — SODIUM CHLORIDE 0.9 % IV SOLN
273.2000 mg | Freq: Once | INTRAVENOUS | Status: AC
  Administered 2021-01-09: 270 mg via INTRAVENOUS
  Filled 2021-01-09: qty 27

## 2021-01-09 MED ORDER — PALONOSETRON HCL INJECTION 0.25 MG/5ML
INTRAVENOUS | Status: AC
  Filled 2021-01-09: qty 5

## 2021-01-09 MED ORDER — SODIUM CHLORIDE 0.9 % IV SOLN
15.0000 mg/kg | Freq: Once | INTRAVENOUS | Status: AC
  Administered 2021-01-09: 800 mg via INTRAVENOUS
  Filled 2021-01-09: qty 32

## 2021-01-09 MED ORDER — SODIUM CHLORIDE 0.9% FLUSH
10.0000 mL | INTRAVENOUS | Status: DC | PRN
  Administered 2021-01-09: 10 mL
  Filled 2021-01-09: qty 10

## 2021-01-09 MED ORDER — DEXAMETHASONE SODIUM PHOSPHATE 100 MG/10ML IJ SOLN
10.0000 mg | Freq: Once | INTRAMUSCULAR | Status: AC
  Administered 2021-01-09: 10 mg via INTRAVENOUS
  Filled 2021-01-09: qty 10

## 2021-01-09 MED ORDER — FAMOTIDINE IN NACL 20-0.9 MG/50ML-% IV SOLN
20.0000 mg | Freq: Once | INTRAVENOUS | Status: AC
  Administered 2021-01-09: 20 mg via INTRAVENOUS

## 2021-01-09 MED ORDER — SODIUM CHLORIDE 0.9 % IV SOLN
Freq: Once | INTRAVENOUS | Status: AC
  Filled 2021-01-09: qty 250

## 2021-01-09 NOTE — Progress Notes (Signed)
[facility] Psychosocial Distress Screening Clinical Social Work  Clinical Social Work was referred by distress screening protocol.  The patient scored a 7 on the Psychosocial Distress Thermometer which indicates moderate distress. Clinical Social Worker contacted patient by phone to assess for distress and other psychosocial needs. CSW and patient discussed common feeling and emotions when being diagnosed with cancer, and the importance of support during treatment. CSW informed patient of the support team and support services at [facility]. CSW provided contact information and encouraged patient to call with any questions or concerns.  [facility] DISTRESS SCREENING 01/08/2021  Screening Type Initial Screening  Distress experienced in past week (1-10) 7  Emotional problem type Nervousness/Anxiety;Adjusting to illness  Spiritual/Religous concerns type Facing my mortality  Information Concerns Type Lack of info about diagnosis;Lack of info about treatment;Lack of info about complementary therapy choices  Physical Problem type Pain  Referral to dietition Yes  Referral to palliative care Yes    Clinical Social Worker follow up needed: No.  If yes, follow up plan:  Beverely Pace, [place], LCSW Clinical Social Worker Phone:  [phone]

## 2021-01-09 NOTE — Patient Instructions (Addendum)
[facility] ONCOLOGY  Discharge Instructions: Thank you for choosing [facility] to provide your oncology and hematology care.   If you have a lab appointment with the [facility], please go directly to the [facility] and check in at the registration area.   Wear comfortable clothing and clothing appropriate for easy access to any Portacath or PICC line.   We strive to give you quality time with your provider. You may need to reschedule your appointment if you arrive late (15 or more minutes).  Arriving late affects you and other patients whose appointments are after yours.  Also, if you miss three or more appointments without notifying the office, you may be dismissed from the clinic at the provider's discretion.      For prescription refill requests, have your pharmacy contact our office and allow 72 hours for refills to be completed.    Today you received the following chemotherapy and/or immunotherapy agents: bevacizumab, paclitaxel, carboplatin    To help prevent nausea and vomiting after your treatment, we encourage you to take your nausea medication as directed.  BELOW ARE SYMPTOMS THAT SHOULD BE REPORTED IMMEDIATELY: . *FEVER GREATER THAN 100.4 F (38 C) OR HIGHER . *CHILLS OR SWEATING . *NAUSEA AND VOMITING THAT IS NOT CONTROLLED WITH YOUR NAUSEA MEDICATION . *UNUSUAL SHORTNESS OF BREATH . *UNUSUAL BRUISING OR BLEEDING . *URINARY PROBLEMS (pain or burning when urinating, or frequent urination) . *BOWEL PROBLEMS (unusual diarrhea, constipation, pain near the anus) . TENDERNESS IN MOUTH AND THROAT WITH OR WITHOUT PRESENCE OF ULCERS (sore throat, sores in mouth, or a toothache) . UNUSUAL RASH, SWELLING OR PAIN  . UNUSUAL VAGINAL DISCHARGE OR ITCHING   Items with * indicate a potential emergency and should be followed up as soon as possible or go to the Emergency Department if any problems should occur.  Please show the CHEMOTHERAPY ALERT  CARD or IMMUNOTHERAPY ALERT CARD at check-in to the Emergency Department and triage nurse.  Should you have questions after your visit or need to cancel or reschedule your appointment, please contact [facility]  Dept: [phone]  and follow the prompts.  Office hours are 8:00 a.m. to 4:30 p.m. Monday - Friday. Please note that voicemails left after 4:00 p.m. may not be returned until the following business day.  We are closed weekends and major holidays. You have access to a nurse at all times for urgent questions. Please call the main number to the clinic Dept: [phone] and follow the prompts.   For any non-urgent questions, you may also contact your provider using MyChart. We now offer e-Visits for anyone 39 and older to request care online for non-urgent symptoms. For details visit [email or web address].   Also download the MyChart app! Go to the app store, search "MyChart", open the app, select [facility], and log in with your MyChart username and password.  Due to Covid, a mask is required upon entering the hospital/clinic. If you do not have a mask, one will be given to you upon arrival. For doctor visits, patients may have 1 support person aged 72 or older with them. For treatment visits, patients cannot have anyone with them due to current Covid guidelines and our immunocompromised population.   Bevacizumab injection What is this medicine? BEVACIZUMAB (be va SIZ yoo mab) is a monoclonal antibody. It is used to treat many types of cancer. This medicine may be used for other purposes; ask your health care provider or  pharmacist if you have questions. COMMON BRAND NAME(S): Avastin, MVASI, Zirabev What should I tell my health care provider before I take this medicine? They need to know if you have any of these conditions:  diabetes  heart disease  high blood pressure  history of coughing up blood  prior anthracycline chemotherapy (e.g.,  doxorubicin, daunorubicin, epirubicin)  recent or ongoing radiation therapy  recent or planning to have surgery  stroke  an unusual or allergic reaction to bevacizumab, hamster proteins, mouse proteins, other medicines, foods, dyes, or preservatives  pregnant or trying to get pregnant  breast-feeding How should I use this medicine? This medicine is for infusion into a vein. It is given by a health care professional in a hospital or clinic setting. Talk to your pediatrician regarding the use of this medicine in children. Special care may be needed. Overdosage: If you think you have taken too much of this medicine contact a poison control center or emergency room at once. NOTE: This medicine is only for you. Do not share this medicine with others. What if I miss a dose? It is important not to miss your dose. Call your doctor or health care professional if you are unable to keep an appointment. What may interact with this medicine? Interactions are not expected. This list may not describe all possible interactions. Give your health care provider a list of all the medicines, herbs, non-prescription drugs, or dietary supplements you use. Also tell them if you smoke, drink alcohol, or use illegal drugs. Some items may interact with your medicine. What should I watch for while using this medicine? Your condition will be monitored carefully while you are receiving this medicine. You will need important blood work and urine testing done while you are taking this medicine. This medicine may increase your risk to bruise or bleed. Call your doctor or health care professional if you notice any unusual bleeding. Before having surgery, talk to your health care provider to make sure it is ok. This drug can increase the risk of poor healing of your surgical site or wound. You will need to stop this drug for 28 days before surgery. After surgery, wait at least 28 days before restarting this drug. Make sure  the surgical site or wound is healed enough before restarting this drug. Talk to your health care provider if questions. Do not become pregnant while taking this medicine or for 6 months after stopping it. Women should inform their doctor if they wish to become pregnant or think they might be pregnant. There is a potential for serious side effects to an unborn child. Talk to your health care professional or pharmacist for more information. Do not breast-feed an infant while taking this medicine and for 6 months after the last dose. This medicine has caused ovarian failure in some women. This medicine may interfere with the ability to have a child. You should talk to your doctor or health care professional if you are concerned about your fertility. What side effects may I notice from receiving this medicine? Side effects that you should report to your doctor or health care professional as soon as possible:  allergic reactions like skin rash, itching or hives, swelling of the face, lips, or tongue  chest pain or chest tightness  chills  coughing up blood  high fever  seizures  severe constipation  signs and symptoms of bleeding such as bloody or black, tarry stools; red or dark-brown urine; spitting up blood or brown material that looks  like coffee grounds; red spots on the skin; unusual bruising or bleeding from the eye, gums, or nose  signs and symptoms of a blood clot such as breathing problems; chest pain; severe, sudden headache; pain, swelling, warmth in the leg  signs and symptoms of a stroke like changes in vision; confusion; trouble speaking or understanding; severe headaches; sudden numbness or weakness of the face, arm or leg; trouble walking; dizziness; loss of balance or coordination  stomach pain  sweating  swelling of legs or ankles  vomiting  weight gain Side effects that usually do not require medical attention (report to your doctor or health care professional if  they continue or are bothersome):  back pain  changes in taste  decreased appetite  dry skin  nausea  tiredness This list may not describe all possible side effects. Call your doctor for medical advice about side effects. You may report side effects to FDA at 1-800-FDA-1088. Where should I keep my medicine? This drug is given in a hospital or clinic and will not be stored at home. NOTE: This sheet is a summary. It may not cover all possible information. If you have questions about this medicine, talk to your doctor, pharmacist, or health care provider.  2021 Elsevier/Gold Standard (2019-06-30 10:50:46)  Paclitaxel injection What is this medicine? PACLITAXEL (PAK li TAX el) is a chemotherapy drug. It targets fast dividing cells, like cancer cells, and causes these cells to die. This medicine is used to treat ovarian cancer, breast cancer, lung cancer, Kaposi's sarcoma, and other cancers. This medicine may be used for other purposes; ask your health care provider or pharmacist if you have questions. COMMON BRAND NAME(S): Onxol, Taxol What should I tell my health care provider before I take this medicine? They need to know if you have any of these conditions:  history of irregular heartbeat  liver disease  low blood counts, like low white cell, platelet, or red cell counts  lung or breathing disease, like asthma  tingling of the fingers or toes, or other nerve disorder  an unusual or allergic reaction to paclitaxel, alcohol, polyoxyethylated castor oil, other chemotherapy, other medicines, foods, dyes, or preservatives  pregnant or trying to get pregnant  breast-feeding How should I use this medicine? This drug is given as an infusion into a vein. It is administered in a hospital or clinic by a specially trained health care professional. Talk to your pediatrician regarding the use of this medicine in children. Special care may be needed. Overdosage: If you think you have  taken too much of this medicine contact a poison control center or emergency room at once. NOTE: This medicine is only for you. Do not share this medicine with others. What if I miss a dose? It is important not to miss your dose. Call your doctor or health care professional if you are unable to keep an appointment. What may interact with this medicine? Do not take this medicine with any of the following medications:  live virus vaccines This medicine may also interact with the following medications:  antiviral medicines for hepatitis, HIV or AIDS  certain antibiotics like erythromycin and clarithromycin  certain medicines for fungal infections like ketoconazole and itraconazole  certain medicines for seizures like carbamazepine, phenobarbital, phenytoin  gemfibrozil  nefazodone  rifampin  St. John's wort This list may not describe all possible interactions. Give your health care provider a list of all the medicines, herbs, non-prescription drugs, or dietary supplements you use. Also tell them if  you smoke, drink alcohol, or use illegal drugs. Some items may interact with your medicine. What should I watch for while using this medicine? Your condition will be monitored carefully while you are receiving this medicine. You will need important blood work done while you are taking this medicine. This medicine can cause serious allergic reactions. To reduce your risk you will need to take other medicine(s) before treatment with this medicine. If you experience allergic reactions like skin rash, itching or hives, swelling of the face, lips, or tongue, tell your doctor or health care professional right away. In some cases, you may be given additional medicines to help with side effects. Follow all directions for their use. This drug may make you feel generally unwell. This is not uncommon, as chemotherapy can affect healthy cells as well as cancer cells. Report any side effects. Continue your  course of treatment even though you feel ill unless your doctor tells you to stop. Call your doctor or health care professional for advice if you get a fever, chills or sore throat, or other symptoms of a cold or flu. Do not treat yourself. This drug decreases your body's ability to fight infections. Try to avoid being around people who are sick. This medicine may increase your risk to bruise or bleed. Call your doctor or health care professional if you notice any unusual bleeding. Be careful brushing and flossing your teeth or using a toothpick because you may get an infection or bleed more easily. If you have any dental work done, tell your dentist you are receiving this medicine. Avoid taking products that contain aspirin, acetaminophen, ibuprofen, naproxen, or ketoprofen unless instructed by your doctor. These medicines may hide a fever. Do not become pregnant while taking this medicine. Women should inform their doctor if they wish to become pregnant or think they might be pregnant. There is a potential for serious side effects to an unborn child. Talk to your health care professional or pharmacist for more information. Do not breast-feed an infant while taking this medicine. Men are advised not to father a child while receiving this medicine. This product may contain alcohol. Ask your pharmacist or healthcare provider if this medicine contains alcohol. Be sure to tell all healthcare providers you are taking this medicine. Certain medicines, like metronidazole and disulfiram, can cause an unpleasant reaction when taken with alcohol. The reaction includes flushing, headache, nausea, vomiting, sweating, and increased thirst. The reaction can last from 30 minutes to several hours. What side effects may I notice from receiving this medicine? Side effects that you should report to your doctor or health care professional as soon as possible:  allergic reactions like skin rash, itching or hives, swelling of  the face, lips, or tongue  breathing problems  changes in vision  fast, irregular heartbeat  high or low blood pressure  mouth sores  pain, tingling, numbness in the hands or feet  signs of decreased platelets or bleeding - bruising, pinpoint red spots on the skin, black, tarry stools, blood in the urine  signs of decreased red blood cells - unusually weak or tired, feeling faint or lightheaded, falls  signs of infection - fever or chills, cough, sore throat, pain or difficulty passing urine  signs and symptoms of liver injury like dark yellow or brown urine; general ill feeling or flu-like symptoms; light-colored stools; loss of appetite; nausea; right upper belly pain; unusually weak or tired; yellowing of the eyes or skin  swelling of the ankles, feet, hands  unusually slow heartbeat Side effects that usually do not require medical attention (report to your doctor or health care professional if they continue or are bothersome):  diarrhea  hair loss  loss of appetite  muscle or joint pain  nausea, vomiting  pain, redness, or irritation at site where injected  tiredness This list may not describe all possible side effects. Call your doctor for medical advice about side effects. You may report side effects to FDA at 1-800-FDA-1088. Where should I keep my medicine? This drug is given in a hospital or clinic and will not be stored at home. NOTE: This sheet is a summary. It may not cover all possible information. If you have questions about this medicine, talk to your doctor, pharmacist, or health care provider.  2021 Elsevier/Gold Standard (2019-08-04 13:37:23)  Carboplatin injection What is this medicine? CARBOPLATIN (KAR boe pla tin) is a chemotherapy drug. It targets fast dividing cells, like cancer cells, and causes these cells to die. This medicine is used to treat ovarian cancer and many other cancers. This medicine may be used for other purposes; ask your health  care provider or pharmacist if you have questions. COMMON BRAND NAME(S): Paraplatin What should I tell my health care provider before I take this medicine? They need to know if you have any of these conditions:  blood disorders  hearing problems  kidney disease  recent or ongoing radiation therapy  an unusual or allergic reaction to carboplatin, cisplatin, other chemotherapy, other medicines, foods, dyes, or preservatives  pregnant or trying to get pregnant  breast-feeding How should I use this medicine? This drug is usually given as an infusion into a vein. It is administered in a hospital or clinic by a specially trained health care professional. Talk to your pediatrician regarding the use of this medicine in children. Special care may be needed. Overdosage: If you think you have taken too much of this medicine contact a poison control center or emergency room at once. NOTE: This medicine is only for you. Do not share this medicine with others. What if I miss a dose? It is important not to miss a dose. Call your doctor or health care professional if you are unable to keep an appointment. What may interact with this medicine?  medicines for seizures  medicines to increase blood counts like filgrastim, pegfilgrastim, sargramostim  some antibiotics like amikacin, gentamicin, neomycin, streptomycin, tobramycin  vaccines Talk to your doctor or health care professional before taking any of these medicines:  acetaminophen  aspirin  ibuprofen  ketoprofen  naproxen This list may not describe all possible interactions. Give your health care provider a list of all the medicines, herbs, non-prescription drugs, or dietary supplements you use. Also tell them if you smoke, drink alcohol, or use illegal drugs. Some items may interact with your medicine. What should I watch for while using this medicine? Your condition will be monitored carefully while you are receiving this medicine.  You will need important blood work done while you are taking this medicine. This drug may make you feel generally unwell. This is not uncommon, as chemotherapy can affect healthy cells as well as cancer cells. Report any side effects. Continue your course of treatment even though you feel ill unless your doctor tells you to stop. In some cases, you may be given additional medicines to help with side effects. Follow all directions for their use. Call your doctor or health care professional for advice if you get a fever, chills or sore  throat, or other symptoms of a cold or flu. Do not treat yourself. This drug decreases your body's ability to fight infections. Try to avoid being around people who are sick. This medicine may increase your risk to bruise or bleed. Call your doctor or health care professional if you notice any unusual bleeding. Be careful brushing and flossing your teeth or using a toothpick because you may get an infection or bleed more easily. If you have any dental work done, tell your dentist you are receiving this medicine. Avoid taking products that contain aspirin, acetaminophen, ibuprofen, naproxen, or ketoprofen unless instructed by your doctor. These medicines may hide a fever. Do not become pregnant while taking this medicine. Women should inform their doctor if they wish to become pregnant or think they might be pregnant. There is a potential for serious side effects to an unborn child. Talk to your health care professional or pharmacist for more information. Do not breast-feed an infant while taking this medicine. What side effects may I notice from receiving this medicine? Side effects that you should report to your doctor or health care professional as soon as possible:  allergic reactions like skin rash, itching or hives, swelling of the face, lips, or tongue  signs of infection - fever or chills, cough, sore throat, pain or difficulty passing urine  signs of decreased  platelets or bleeding - bruising, pinpoint red spots on the skin, black, tarry stools, nosebleeds  signs of decreased red blood cells - unusually weak or tired, fainting spells, lightheadedness  breathing problems  changes in hearing  changes in vision  chest pain  high blood pressure  low blood counts - This drug may decrease the number of white blood cells, red blood cells and platelets. You may be at increased risk for infections and bleeding.  nausea and vomiting  pain, swelling, redness or irritation at the injection site  pain, tingling, numbness in the hands or feet  problems with balance, talking, walking  trouble passing urine or change in the amount of urine Side effects that usually do not require medical attention (report to your doctor or health care professional if they continue or are bothersome):  hair loss  loss of appetite  metallic taste in the mouth or changes in taste This list may not describe all possible side effects. Call your doctor for medical advice about side effects. You may report side effects to FDA at 1-800-FDA-1088. Where should I keep my medicine? This drug is given in a hospital or clinic and will not be stored at home. NOTE: This sheet is a summary. It may not cover all possible information. If you have questions about this medicine, talk to your doctor, pharmacist, or health care provider.  2021 Elsevier/Gold Standard (2007-12-08 14:38:05)

## 2021-01-09 NOTE — Progress Notes (Signed)
Urine protein ordered for today as baseline. OK to proceed w/ Mvasi w/o result per Dr. Burr Medico.  Kennith Center, Pharm.D., CPP 01/09/2021@8 :36 AM

## 2021-01-11 ENCOUNTER — Other Ambulatory Visit: Payer: Self-pay

## 2021-01-11 ENCOUNTER — Inpatient Hospital Stay: Payer: Medicare Other

## 2021-01-11 VITALS — BP 101/71 | HR 92 | Resp 18

## 2021-01-11 DIAGNOSIS — C7951 Secondary malignant neoplasm of bone: Secondary | ICD-10-CM | POA: Diagnosis not present

## 2021-01-11 DIAGNOSIS — Z5112 Encounter for antineoplastic immunotherapy: Secondary | ICD-10-CM | POA: Diagnosis not present

## 2021-01-11 DIAGNOSIS — C50212 Malignant neoplasm of upper-inner quadrant of left female breast: Secondary | ICD-10-CM | POA: Diagnosis not present

## 2021-01-11 DIAGNOSIS — C3412 Malignant neoplasm of upper lobe, left bronchus or lung: Secondary | ICD-10-CM | POA: Diagnosis not present

## 2021-01-11 DIAGNOSIS — C770 Secondary and unspecified malignant neoplasm of lymph nodes of head, face and neck: Secondary | ICD-10-CM | POA: Diagnosis not present

## 2021-01-11 DIAGNOSIS — Z5111 Encounter for antineoplastic chemotherapy: Secondary | ICD-10-CM | POA: Diagnosis not present

## 2021-01-11 DIAGNOSIS — C3492 Malignant neoplasm of unspecified part of left bronchus or lung: Secondary | ICD-10-CM

## 2021-01-11 MED ORDER — PEGFILGRASTIM-BMEZ 6 MG/0.6ML ~~LOC~~ SOSY
PREFILLED_SYRINGE | SUBCUTANEOUS | Status: AC
  Filled 2021-01-11: qty 0.6

## 2021-01-11 MED ORDER — PEGFILGRASTIM-BMEZ 6 MG/0.6ML ~~LOC~~ SOSY
6.0000 mg | PREFILLED_SYRINGE | Freq: Once | SUBCUTANEOUS | Status: AC
  Administered 2021-01-11: 6 mg via SUBCUTANEOUS

## 2021-01-11 NOTE — Patient Instructions (Signed)
Pegfilgrastim injection What is this medicine? PEGFILGRASTIM (PEG fil gra stim) is a long-acting granulocyte colony-stimulating factor that stimulates the growth of neutrophils, a type of white blood cell important in the body's fight against infection. It is used to reduce the incidence of fever and infection in patients with certain types of cancer who are receiving chemotherapy that affects the bone marrow, and to increase survival after being exposed to high doses of radiation. This medicine may be used for other purposes; ask your health care provider or pharmacist if you have questions. COMMON BRAND NAME(S): Fulphila, Neulasta, Nyvepria, UDENYCA, Ziextenzo What should I tell my health care provider before I take this medicine? They need to know if you have any of these conditions:  kidney disease  latex allergy  ongoing radiation therapy  sickle cell disease  skin reactions to acrylic adhesives (On-Body Injector only)  an unusual or allergic reaction to pegfilgrastim, filgrastim, other medicines, foods, dyes, or preservatives  pregnant or trying to get pregnant  breast-feeding How should I use this medicine? This medicine is for injection under the skin. If you get this medicine at home, you will be taught how to prepare and give the pre-filled syringe or how to use the On-body Injector. Refer to the patient Instructions for Use for detailed instructions. Use exactly as directed. Tell your healthcare provider immediately if you suspect that the On-body Injector may not have performed as intended or if you suspect the use of the On-body Injector resulted in a missed or partial dose. It is important that you put your used needles and syringes in a special sharps container. Do not put them in a trash can. If you do not have a sharps container, call your pharmacist or healthcare provider to get one. Talk to your pediatrician regarding the use of this medicine in children. While this drug  may be prescribed for selected conditions, precautions do apply. Overdosage: If you think you have taken too much of this medicine contact a poison control center or emergency room at once. NOTE: This medicine is only for you. Do not share this medicine with others. What if I miss a dose? It is important not to miss your dose. Call your doctor or health care professional if you miss your dose. If you miss a dose due to an On-body Injector failure or leakage, a new dose should be administered as soon as possible using a single prefilled syringe for manual use. What may interact with this medicine? Interactions have not been studied. This list may not describe all possible interactions. Give your health care provider a list of all the medicines, herbs, non-prescription drugs, or dietary supplements you use. Also tell them if you smoke, drink alcohol, or use illegal drugs. Some items may interact with your medicine. What should I watch for while using this medicine? Your condition will be monitored carefully while you are receiving this medicine. You may need blood work done while you are taking this medicine. Talk to your health care provider about your risk of cancer. You may be more at risk for certain types of cancer if you take this medicine. If you are going to need a MRI, CT scan, or other procedure, tell your doctor that you are using this medicine (On-Body Injector only). What side effects may I notice from receiving this medicine? Side effects that you should report to your doctor or health care professional as soon as possible:  allergic reactions (skin rash, itching or hives, swelling of   the face, lips, or tongue)  back pain  dizziness  fever  pain, redness, or irritation at site where injected  pinpoint red spots on the skin  red or dark-brown urine  shortness of breath or breathing problems  stomach or side pain, or pain at the shoulder  swelling  tiredness  trouble  passing urine or change in the amount of urine  unusual bruising or bleeding Side effects that usually do not require medical attention (report to your doctor or health care professional if they continue or are bothersome):  bone pain  muscle pain This list may not describe all possible side effects. Call your doctor for medical advice about side effects. You may report side effects to FDA at 1-800-FDA-1088. Where should I keep my medicine? Keep out of the reach of children. If you are using this medicine at home, you will be instructed on how to store it. Throw away any unused medicine after the expiration date on the label. NOTE: This sheet is a summary. It may not cover all possible information. If you have questions about this medicine, talk to your doctor, pharmacist, or health care provider.  2021 [employer] (2019-09-24 13:20:51)  

## 2021-01-12 ENCOUNTER — Telehealth: Payer: Self-pay | Admitting: Emergency Medicine

## 2021-01-12 NOTE — Telephone Encounter (Signed)
A Randomized pragmatic Chair-Based Home Exercise Intervention for Mitigating Cancer-Related Fatigue in Older Adults Undergoing Chemotherapy for Advanced Disease  01/12/21  Called patient to give information concerning this research study.  Dr. Burr Medico introduced this study to the patient on Monday and requested I call the patient to discuss the study.  Explained the study procedures and randomization to the patient.  Patient requested information be sent to her through the mail to review.  A copy of the informed consent form and HIPAA authorization was sent to the patient through the mail.  The patient had questions about how she was supposed to be taking ondansetron and prochlorperazine.  A message was sent to the Dr. Ernestina Penna collaborative nurse.     Clabe Seal Clinical Research Coordinator I  01/12/21  10:49 AM

## 2021-01-12 NOTE — Progress Notes (Signed)
[place]   [phone] [phone]   Clinic Follow up Note   Patient Care Team: [name], MD as PCP - General (Family Medicine) Collene Gobble, MD (Pulmonary Disease) Erroll Luna, MD as Consulting Physician (General Surgery) Truitt Merle, MD as Consulting Physician (Hematology)  Date of Service:  01/15/2021  CHIEF COMPLAINT: f/u ofmetastatic lung cancer  SUMMARY OF ONCOLOGIC HISTORY: Oncology History Overview Note  Cancer Staging Malignant neoplasm of upper-inner quadrant of left breast in female, estrogen receptor negative ([place]) Staging form: Breast, AJCC 8th Edition - Clinical stage from 11/01/2020: Stage IB (cT1c, cN0, cM0, G3, ER-, PR-, HER2-) - Signed by Truitt Merle, MD on 11/08/2020 Stage prefix: Initial diagnosis - Pathologic stage from 11/21/2020: Stage IB (pT1c, pN0, cM0, G3, ER-, PR-, HER2-) - Signed by [name], NP on 12/20/2020 Stage prefix: Initial diagnosis Histologic grading system: 3 grade system  Non-small cell lung cancer ([place]) Staging form: Lung, AJCC 7th Edition - Clinical: Stage IA (Free text: Ia) - Signed by [name], MD on 12/28/2013 Laterality: Left Cancer stage: Ia - Pathologic: Ia - Signed by [name], MD on 12/28/2013 Laterality: Left Cancer stage: Ia    Non-small cell lung cancer ([place])  09/02/2011 Imaging   CT Chest  IMPRESSION:   1.  Interval clearing of right upper lobe pneumonia.  2.  Left upper lobe nodule is unchanged in the short interval from  07/29/2011.  Follow-up could be performed in 3 months to ensure  continued stability. This recommendation follows the consensus  statement: Guidelines for Management of Small Pulmonary Nodules  Detected on CT Scans:  A Statement from the [employer] as  published in Radiology 2005; 237:395-400.  Available online at:  [email or web address]  3.  Additional scattered pulmonary  nodules can be reevaluated on  future imaging as well.    11/06/2011 Imaging   PET IMPRESSION:   1.  Mild hypermetabolism which projects minimally cephalad to, but  is felt to correspond to the left upper lobe lung nodule.  Although  not within malignant range, given the small size of the nodule,  malignancy cannot be excluded.  Possible mild interval enlargement  of the nodule since 09/02/2011.  Consider repeat standard chest CT  to confirm interval enlargement.  Especially if interval  enlargement has occurred, tissue sampling would be suggested.  Alternatively, 44-monthfollow-up chest CT could be performed.  2.  No evidence of thoracic nodal or extrathoracic disease.  3. Vague hypermetabolism corresponding to a prominent right lobe of  the thyroid. Nonspecific.  Consider ultrasound correlation.IMPRESSION:   1.  Mild hypermetabolism which projects minimally cephalad to, but  is felt to correspond to the left upper lobe lung nodule.  Although  not within malignant range, given the small size of the nodule,  malignancy cannot be excluded.  Possible mild interval enlargement  of the nodule since 09/02/2011.  Consider repeat standard chest CT  to confirm interval enlargement.  Especially if interval  enlargement has occurred, tissue sampling would be suggested.  Alternatively, 323-monthollow-up chest CT could be performed.  2.  No evidence of thoracic nodal or extrathoracic disease.  3. Vague hypermetabolism corresponding to a prominent right lobe of  the thyroid. Nonspecific.  Consider ultrasound correlation.   12/17/2011 Surgery   Thoracoscopy by Dr [name] Diagnosis 1. Lung, wedge biopsy/resection, left upper lobe - INVASIVE WELL-DIFFERENTIATED ADENOCARCINOMA, SPANNING 1.2 CM. - NO LYMPH VASCULAR INVASION IDENTIFIED. - PLEURA IS UNINVOLVED. - MARGINS  ARE NEGATIVE. - SEE ONCOLOGY TEMPLATE. 2. Lymph node, biopsy, level 9 - ONE BENIGN LYMPH NODE WITH NO TUMOR SEEN  (0/1). 3. Lymph node, biopsy, level 9 - ONE BENIGN LYMPH NODE WITH NO TUMOR SEEN (0/1). 4. Lymph node, biopsy, 11 - ONE BENIGN LYMPH NODE WITH NO TUMOR SEEN (0/1). 5. Lymph node, biopsy, 11 #2 - ONE BENIGN LYMPH NODE WITH NO TUMOR SEEN (0/1). 6. Lymph node, biopsy, 5 - ONE BENIGN LYMPH NODE WITH NO TUMOR SEEN (0/1). 7. Lymph node, biopsy, 10 - ONE BENIGN LYMPH NODE WITH NO TUMOR SEEN (0/1). 8. Lymph node, biopsy, 11 #3 - ONE BENIGN LYMPH NODE WITH NO TUMOR SEEN (0/1). 9. Lung, resection (segmental or lobe), Remainder of left upper lobe - BENIGN LUNG PARENCHYMA WITH CHRONIC INFLAMMATION AND INCREASED PULMONARY MACROPHAGES. - THREE BENIGN LYMPH NODES IDENTIFIED (0/3). - NO TUMOR SEEN.    01/08/2012 Initial Diagnosis   Non-small cell lung cancer ([place])   02/10/2018 Imaging   CT Chest IMPRESSION: 1. Enlarging irregular pleural-based solid 1.9 cm pulmonary nodule in the superior segment left lower lobe, suspicious for malignancy, either recurrent disease or a metachronous primary bronchogenic carcinoma. Consider PET-CT for further characterization. 2. Additional scattered bilateral pulmonary nodules are stable and considered benign. 3. No noncontrast CT evidence of thoracic adenopathy. 4. Three-vessel coronary atherosclerosis. 5. Nonobstructing left nephrolithiasis.   Aortic Atherosclerosis (ICD10-I70.0) and Emphysema (ICD10-J43.9).   02/18/2018 PET scan   IMPRESSION: 1. Hypermetabolic apical nodule in the superior segment left lower lobe is highly worrisome for bronchogenic carcinoma. 2. No additional areas of abnormal hypermetabolism in the neck, chest, abdomen or pelvis. 3. Aortic atherosclerosis (ICD10-170.0). Coronary artery calcification. 4.  Emphysema (ICD10-J43.9). 5. Left renal stone.     03/06/2018 Pathology Results   Lung Biopsy  Diagnosis Lung, needle/core biopsy(ies), Left Lower Lobe - ADENOCARCINOMA. Microscopic Comment There is likely sufficient tissue for  additional studies if requested (block 1B). Dr. Lyndon Code has reviewed the case.   04/08/2018 Surgery   Left Video assisted Thoracoscopy by Dr Roxan Hockey  Diagnosis Lung, wedge biopsy/resection, Left Lower Lobe - INVASIVE ADENOCARCINOMA, MODERATELY DIFFERENTIATED, SPANNING 1.3 CM. - ADENOCARCINOMA INVOLVES VISCERAL PLEURA. - ONE BENIGN INTRAPULMONARY LYMPH NODE (0/1). - LYMPHOVASCULAR INVASION IS IDENTIFIED, FOCAL. - THE SURGICAL RESECTION MARGINS ARE NEGATIVE FOR CARCINOMA. - SEE ONCOLOGY TABLE BELOW.    08/01/2020 Imaging   CT Chest  IMPRESSION: Stable postop changes from prior left upper lobectomy. Stable small bilateral pulmonary nodules. No new or progressive disease within the thorax.   Aortic Atherosclerosis (ICD10-I70.0) and Emphysema (ICD10-J43.9).   Malignant neoplasm of upper-inner quadrant of left breast in female, estrogen receptor negative ([place])  10/18/2020 Mammogram   Mammogram 10/18/20 at [name] FINDINGS:  Cc and MLO views of bilateral breasts are submitted. There is a  spiculated mass in the palpable area upper left breast. The right  breast is negative.   Targeted ultrasound is performed, showing 1.2 x 0.9 x 1.2 cm  spiculated hypoechoic mass at the left breast 11 o'clock 8 cm from  nipple palpable area. This correlates to the mammographic mass.  Ultrasound of the left axilla is negative.   IMPRESSION:  Highly suspicious findings.     10/26/2020 Initial Biopsy   Final Pathologic Diagnosis  10/26/20 at [facility] BREAST, LEFT 1:00 8 CM FROM NIPPLE, NEEDLE BIOPSY:              INVASIVE DUCTAL CARCINOMA, NOTTINGHAM GRADE 3. Comment   The Nottingham grade is 3 (3-tubule formation, 3-nuclear atypia, 3-mitoses).  This case was reviewed by Dr. Vicenta Dunning and she is in essential agreement with the above diagnosis.  Final Pathologic Diagnosis   Prognostic Markers in Cancer   Block Number:  [id number] Diagnosis:  Invasive ductal carcinoma     Results:   Estrogen  Receptor:  Negative (<1%)   Progesterone Receptor:  Negative (<1%)   Her2:  Negative (score=1+)     Ki-67:  Percentage of tumor cells with nuclear positivity: 60 %      11/01/2020 Cancer Staging   Staging form: Breast, AJCC 8th Edition - Clinical stage from 11/01/2020: Stage IB (cT1c, cN0, cM0, G3, ER-, PR-, HER2-) - Signed by Truitt Merle, MD on 11/08/2020 Stage prefix: Initial diagnosis   11/08/2020 Initial Diagnosis   Malignant neoplasm of upper-inner quadrant of left breast in female, estrogen receptor negative ([place])   11/21/2020 Surgery   LEFT BREAST LUMPECTOMY WITH RADIOACTIVE SEED AND LEFT SENTINEL LYMPH NODE [place] and PAC placement by Dr Brantley Stage    11/21/2020 Pathology Results   FINAL MICROSCOPIC DIAGNOSIS:   A. BREAST, LEFT, LUMPECTOMY:  - Invasive ductal carcinoma, grade 3, 1.3 cm  - The anterior resection margin is widely involved by carcinoma  - Carcinoma is 0.2 cm from posterior margin  - Biopsy site changes  - See oncology table   B. BREAST, LEFT ADDITIONAL SUPERIOR MARGIN, EXCISION:  - Unremarkable fibroadipose tissue, negative for carcinoma   C. BREAST, LEFT ADDITIONAL LATERAL MARGIN, EXCISION:  - Unremarkable fibroadipose tissue, negative for carcinoma   D. BREAST, LEFT ADDITIONAL INFEROMEDIAL MARGIN, EXCISION:  - Unremarkable fibroadipose tissue, negative for carcinoma   E. LYMPH NODE, LEFT AXILLARY, SENTINEL, EXCISION:  - Lymph node, negative for carcinoma (0/1)    11/21/2020 Cancer Staging   Staging form: Breast, AJCC 8th Edition - Pathologic stage from 11/21/2020: Stage IB (pT1c, pN0, cM0, G3, ER-, PR-, HER2-) - Signed by [name], NP on 12/20/2020 Stage prefix: Initial diagnosis Histologic grading system: 3 grade system   Metastatic lung cancer (metastasis from lung to other site), left ([place])  12/11/2020 Imaging   CT Chest  IMPRESSION: 1. Signs of tumor recurrence within the apical portion of the left hemithorax with large rind of increased  soft tissue. Tumor extends into the left upper thoracic spine paravertebral soft tissues. 2. Pleural spread of disease is noted with mild progressive pleural thickening overlying the posterior and lateral left lower lung. 3. Interval development of increased interstitial markings within the left upper lobe adjacent to mass. In the absence of interval external beam radiation (since 08/01/2020) this is suspicious for lymphangitic spread of tumor. 4. Progressive soft tissue mass the within the paravertebral right upper lobe concerning for metastatic disease. 5. New enlarged anterior mediastinal and left supraclavicular lymph nodes compatible with metastatic adenopathy. 6. Indeterminate soft tissue attenuating nodular density between the upper pole of left kidney and left adrenal gland. Cannot exclude nodal metastasis or adrenal gland metastasis. 7. Aortic atherosclerosis.   Aortic Atherosclerosis (ICD10-I70.0).   12/22/2020 Pathology Results   A. LYMPH NODE, LEFT NECK, NEEDLE CORE BIOPSY:  - Metastatic adenocarcinoma.   COMMENT:   CK7 is positive.  CK20, TTF-1, Napsin-A, CDX-2, PAX 8, GATA-3 and ER are  negative.  The limited CK7 positive staining is compatible with origin  from lung; however, it does not exclude origin from other entities. If  applicable, there is likely sufficient tissue for ancillary studies.  Dr. Saralyn Pilar reviewed the case.    12/27/2020 Imaging   PET scan IMPRESSION: 1. Large  hypermetabolic mass occupying the left lung apex consistent with recurrent neoplasm. 2. Extensive metastatic disease involving the chest, abdomen, pelvis, bony structures, muscles and subcutaneous tissues.   01/07/2021 Initial Diagnosis   Metastatic lung cancer (metastasis from lung to other site), left ([place])   01/08/2021 -  Chemotherapy   First-line Carboplatin and Taxol with Bevacuzimab q3weeks starting 01/08/21      CURRENT THERAPY:  First-line Carboplatin and Taxol with  Bevacuzimab q3weeks starting 01/08/21  INTERVAL HISTORY:  Valerie Mosley is here for a follow up. She was last seen by me 01/08/21. She presents to the clinic with her husband. She notes after her treatment last week, she had felt good with little to no pain the first day. She notes day 2 she had GCSF injection she had increased pain in her knees and her back. She notes day 5 she could not sleep because of pain in her toes. This lasted 2-3 days. She notes her known right shoulder pain has mildly improved. She denies nausea and vomiting but had constipation. She notes she did not take much pain medication. Before chemo she took 4 tabs Oxycodone a day, now down to 1 tab. She did take Claritin.  She notes headaches more frequently, but not daily, since starting chemo. She notes 1 week ago she had double vision in her left eye that lasted 1 hour before resolving.    REVIEW OF SYSTEMS:   Constitutional: Denies fevers, chills or abnormal weight loss (+)headaches  Eyes: Denies blurriness of vision Ears, nose, mouth, throat, and face: Denies mucositis or sore throat Respiratory: Denies cough, dyspnea or wheezes Cardiovascular: Denies palpitation, chest discomfort or lower extremity swelling Gastrointestinal:  Denies nausea, heartburn (+) Constipation  Skin: Denies abnormal skin rashes MSK: (+) Improved right shoulder pian.  Lymphatics: Denies new lymphadenopathy or easy bruising Neurological:Denies numbness, tingling or new weaknesses Behavioral/Psych: Mood is stable, no new changes  All other systems were reviewed with the patient and are negative.  MEDICAL HISTORY:  Past Medical History:  Diagnosis Date  . Breast cancer ([place])   . Cancer ([place])    Stage IA non-small cell lung cancer, left upper lobectomy 12/2011  . COPD (chronic obstructive pulmonary disease) ([place])   . Cough 07/29/11   started coughing up blood this am  . Cough   . Hypertension   . Kyphoscoliosis   . Pneumonia   . PONV  (postoperative nausea and vomiting)   . Recurrent upper respiratory infection (URI)     SURGICAL HISTORY: Past Surgical History:  Procedure Laterality Date  . BREAST LUMPECTOMY WITH RADIOACTIVE SEED AND SENTINEL LYMPH NODE BIOPSY Left 11/21/2020   Procedure: LEFT BREAST LUMPECTOMY WITH RADIOACTIVE SEED AND LEFT SENTINEL LYMPH NODE [place];  Surgeon: Erroll Luna, MD;  Location: [facility];  Service: General;  Laterality: Left;  PEC BLOCK; START TIME OF 11:00 AM FOR 90 MINUTES IN ROOM 2  . BRONCHOSCOPY    . CATARACT EXTRACTION, BILATERAL  2016  . EYE SURGERY    . INSERTION OF IBV VALVE Left 04/16/2018   Procedure: INSERTION OF INTERBRONCHIAL VALVE (IBV);  Surgeon: [name], MD;  Location: [facility];  Service: Thoracic;  Laterality: Left;  . INSERTION OF IBV VALVE N/A 06/25/2018   Procedure: REMOVAL OF THREE INTERBRONCHIAL VALVE (IBV);  Surgeon: [name], MD;  Location[facility];  Service: Thoracic;  Laterality: N/A;  . LOBECTOMY  12/17/2011   Procedure: LOBECTOMY;  Surgeon: [name], MD;  Location: [facility];  Service:  Thoracic;  Laterality: Left;  (L)VATS, WEDGE RESECTION, LEFT UPPER LOBECTOMY,  Multiple node biopsies  . PORTACATH PLACEMENT Left 11/21/2020   Procedure: INSERTION PORT-A-CATH;  Surgeon: Erroll Luna, MD;  Location: [facility];  Service: General;  Laterality: Left;  . REMOVAL OF PLEURAL DRAINAGE CATHETER Left 05/06/2018   Procedure: REMOVAL OF CHEST TUBE;  Surgeon: [name], MD;  Location: [facility];  Service: Thoracic;  Laterality: Left;  . THYROID SURGERY  1980   1/2 thyroid removed - benign  . URETHRA SURGERY    . VIDEO ASSISTED THORACOSCOPY Left 04/08/2018   Procedure: REDO LEFT VIDEO ASSISTED THORACOSCOPY with left lower lobe wedge resection;  Surgeon: [name], MD;  Location: [facility];  Service: Thoracic;  Laterality: Left;  Marland Kitchen VIDEO BRONCHOSCOPY N/A 04/16/2018   Procedure: VIDEO BRONCHOSCOPY;  Surgeon: [name], MD;  Location:  [facility];  Service: Thoracic;  Laterality: N/A;  . VIDEO BRONCHOSCOPY N/A 06/25/2018   Procedure: VIDEO BRONCHOSCOPY;  Surgeon: [name], MD;  Location: [facility] OR;  Service: Thoracic;  Laterality: N/A;    I have reviewed the social history and family history with the patient and they are unchanged from previous note.  ALLERGIES:  has No Known Allergies.  MEDICATIONS:  Current Outpatient Medications  Medication Sig Dispense Refill  . acetaminophen (TYLENOL) 500 MG tablet Take 1,000 mg by mouth every 6 (six) hours as needed for moderate pain or headache.    Marland Kitchen acyclovir ointment (ZOVIRAX) 5 % Apply 1 application topically every 3 (three) hours as needed. (Patient taking differently: Apply 1 application topically every 3 (three) hours as needed (cold sores).) 15 g 0  . albuterol (VENTOLIN HFA) 108 (90 Base) MCG/ACT inhaler Inhale 2 puffs into the lungs every 4 (four) hours as needed for wheezing or shortness of breath. For shortness of breath 8.51 g 5  . amLODipine (NORVASC) 10 MG tablet Take 10 mg by mouth daily.    Marland Kitchen BEVESPI AEROSPHERE 9-4.8 MCG/ACT AERO INHALE 2 PUFFS INTO THE LUNGS 2 TIMES DAILY. (Patient taking differently: Inhale 2 puffs into the lungs in the morning and at bedtime.) 10.7 g 11  . colestipol (COLESTID) 1 g tablet Take 1 g by mouth daily as needed (diarrhea).    . dexamethasone (DECADRON) 4 MG tablet Take 2 tablets (8 mg total) by mouth daily. Start the day after carboplatin chemotherapy for 3 days. 30 tablet 1  . lidocaine-prilocaine (EMLA) cream Apply to affected area once 30 g 3  . mirtazapine (REMERON) 7.5 MG tablet Take 1 tablet (7.5 mg total) by mouth at bedtime. 30 tablet 0  . montelukast (SINGULAIR) 10 MG tablet Take 1 tablet (10 mg total) by mouth at bedtime. 30 tablet 5  . ondansetron (ZOFRAN) 8 MG tablet Take 1 tablet (8 mg total) by mouth 2 (two) times daily as needed for refractory nausea / vomiting. Start on day 3 after carboplatin chemo. 30 tablet 1  .  oxyCODONE (OXY IR/ROXICODONE) 5 MG immediate release tablet Take 1 tablet (5 mg total) by mouth every 4 (four) hours as needed for severe pain. 60 tablet 0  . prochlorperazine (COMPAZINE) 10 MG tablet Take 1 tablet (10 mg total) by mouth every 6 (six) hours as needed (Nausea or vomiting). 30 tablet 1  . sertraline (ZOLOFT) 50 MG tablet Take 50 mg by mouth at bedtime.    . traMADol (ULTRAM) 50 MG tablet Take 1 tablet (50 mg total) by mouth every 6 (six) hours as needed. 60 tablet  0   No current facility-administered medications for this visit.    PHYSICAL EXAMINATION: ECOG PERFORMANCE STATUS: 2 - Symptomatic, <50% confined to bed  Vitals:   01/15/21 1032  BP: 90/64  Pulse: (!) 104  Resp: 17  Temp: 98.6 F (37 C)  SpO2: 94%   Filed Weights   01/15/21 1032  Weight: 110 lb 3.2 oz (50 kg)    GENERAL:alert, no distress and comfortable SKIN: skin color, texture, turgor are normal, no rashes or significant lesions (+) 1cm subcutaneous nodule in supraclavicular area (+) 0.5cm Left upper back subcutaneus nodule  EYES: normal, Conjunctiva are pink and non-injected, sclera clear  NECK: supple, thyroid normal size, non-tender, without nodularity LYMPH:  no palpable lymphadenopathy in the cervical, axillary  LUNGS: clear to auscultation and percussion with normal breathing effort HEART: regular rate & rhythm and no murmurs and no lower extremity edema ABDOMEN:abdomen soft, non-tender and normal bowel sounds Musculoskeletal:no cyanosis of digits and no clubbing  NEURO: alert & oriented x 3 with fluent speech, no focal motor/sensory deficits  LABORATORY DATA:  I have reviewed the data as listed CBC Latest Ref Rng & Units 01/15/2021 01/08/2021 12/28/2020  WBC 4.0 - 10.5 K/uL 20.6(H) 9.7 10.5  Hemoglobin 12.0 - 15.0 g/dL 12.9 12.5 13.1  Hematocrit 36.0 - 46.0 % 37.8 36.3 39.1  Platelets 150 - 400 K/uL 339 408(H) 373     CMP Latest Ref Rng & Units 01/15/2021 01/08/2021 12/28/2020  Glucose 70 - 99  mg/dL 113(H) 91 86  BUN 8 - 23 mg/dL 16 12 15   Creatinine 0.44 - 1.00 mg/dL 0.62 0.57 0.59  Sodium 135 - 145 mmol/L 132(L) 131(L) 134(L)  Potassium 3.5 - 5.1 mmol/L 3.3(L) 2.9(L) 3.2(L)  Chloride 98 - 111 mmol/L 92(L) 90(L) 98  CO2 22 - 32 mmol/L 29 28 25   Calcium 8.9 - 10.3 mg/dL 8.9 8.8(L) 8.8(L)  Total Protein 6.5 - 8.1 g/dL 6.6 6.8 7.1  Total Bilirubin 0.3 - 1.2 mg/dL 0.3 0.5 0.4  Alkaline Phos 38 - 126 U/L 159(H) 125 153(H)  AST 15 - 41 U/L 16 18 18   ALT 0 - 44 U/L 9 9 11       RADIOGRAPHIC STUDIES: I have personally reviewed the radiological images as listed and agreed with the findings in the report. No results found.   ASSESSMENT & PLAN:  Valerie Mosley is a 70 y.o. female with   1.Metastatic Lung Adenocarcinoma -Chest CT 12/11/20 showeda large soft tissue mass in the left apical portion of the lung, with direct invasion of the left upper thoracic spine,probable pleural metastasis and lymph edetic metastasis in left lung,enlarged anterior mediastinal and left supraclavicular lymph nodes,compatible with metastatic disease. -This is likely recurrent metastatic lung cancer,with high tumor burden. -Left supraclavicular lymph node biopsyon 12/22/20 confirmed metastatic adenocarcinoma -PET scan 12/27/20 showed: large hypermetabolic mass occupying left lung apex consistent with recurrent neoplasm; extensive metastatic disease involving chest, abdomen, pelvis, bony structures, muscles, and subcutaneous tissues.  -Her FO and PD-L1 test result from [facility] node biopsy is still pending.  -I discussed the comparison of her breast tumor and node biopsy by pathologistDr. Hamilton Capri similar but not the exact same. I still have suspicion this is from the same cancer.  -I started her on first-line chemo with Carboplatin and Taxol with Bevacizumab q3weeks on 01/09/21, which will cover both lung and breast cancer if she has two primaries. Goal of therapy is to control disease and prolong her  life. Will scan her in 3 months  to monitor response.  -May add immunotherapy depends on her PD-L1 result -S/p week 1 she tolerated chemo overall well, but her main side effect was pain from her GCSF injection for 2-3 days.  -She notes recent increased of headaches from starting chemo. She also notes 1 episode of left eye double vision 1 week ago. I discussed with lung cancer has risk for metastasizing to the brain. I recommend brain MRI. She is agreeable.  -She has 2 subcutaneous nodules of left superior shoulder on exam today (01/15/21), that we can use to monitor for treatment response. Labs reviewed, WBC 20.6. K 3.3, the rest of CBC and CMP unremarkable  -we reviewed pain and constipation management today  -F/u in 2 weeks with next cycle. May slightly increase dose if she recovers well.  -We will get a brain MRI with and without contrast to complete her staging.  She does have mild intermittent headaches.   2. Pain in right scapula secondary to #1 -She has pain to her left scapula, rated 5-6/10 on 12/28/20, at location of metastatic tumor -Dr. Isidore Moos discussed with patient in 11/3020 to hold on palliative Radiation for now.  -She has MS Contin 53m BID, but has not needed. Pain overall controlled on oxycodone 57mas needed for breakthrough pain.  -Since starting chemo this pain has improved. However she has moderate to significant bone pain from GCSF for 2-3 days. She can use Oxycodone if needed for this pain. She will also continue Claritin.   -She has Mirtazapine, so she could wean off Zoloft.  -I discussed she has a lot of sedative medications and to not take them all at the same time.  -With pain medication, she knows to watch for constipation and use stool softener.   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 Her 10/2020  mammogram showeda1.2cm mass in the 11:00 position of her left breast. 10/26/20 Biopsy showed invasive ductal carcinoma, triple negative -She underwent left breast lumpectomy and sentinel lymph node biopsyon 11/21/20.Her surgical pathology revealed a 1.3 cm grade 3 invasive ductal carcinoma, with negative lymph nodes. The anterior resection margin was positive. -given her PET scan findings of multiple subcutaneous and intramuscular metastasis, I suspect this is part of her metastatic lung cancer  -I discussed chemo for her lung met will is also effective regimen for triple negative cancer -Due to her metastatic lung cancer, will hold on adjuvant radiation for now.  3.History ofLULstage IA, T1 a, N0, M0) non-small cell lung cancer, adenocarcinomaDxin 2013 + LLLstage Ib (T2, and 0, M0) non-small cell lung cancer, adenocarcinomaDx 02/2018.  4. COPD and HTN -She quit smoking in 2013 after she was diagnosed with lung cancer.  -She has SOB and uses inhaler daily. Not on oxygen.  5. Hypokalemia  -Her 01/08/21 potassium is 2.9. I recommend she increase potassium in diet. If lowers further I will start her on oral potassium.  -K 3.3 today, will call in KCL 1071mdaily for a week      Plan -Lab, flush, F/u and chemo carboplatin and Taxol and Beva in 2 and 5 weeks, may slightly increase dose next cycle  -Brain MRI in 1-2 weeks to rule out brain metastasis -PD-L1 and FO result pending    No problem-specific Assessment & Plan notes found for this encounter.   Orders Placed This Encounter  Procedures  . MR  Brain W Wo Contrast    Pt has mild intermittent headache and one episode of left double vision, rule out brain metastasis    Standing Status:   Future    Standing Expiration Date:   01/15/2022    Order Specific Question:   If indicated for the ordered procedure, I authorize the administration of contrast media per Radiology protocol    Answer:   Yes    Order Specific Question:    What is the patient's sedation requirement?    Answer:   No Sedation    Order Specific Question:   Does the patient have a pacemaker or implanted devices?    Answer:   No    Order Specific Question:   Use SRS Protocol?    Answer:   No    Order Specific Question:   Preferred imaging location?    Answer:   [facility] (table limit - 550 lbs)   All questions were answered. The patient knows to call the clinic with any problems, questions or concerns. No barriers to learning was detected. The total time spent in the appointment was 30 minutes.     Truitt Merle, MD 01/15/2021   I, Joslyn Devon, am acting as scribe for Truitt Merle, MD.   I have reviewed the above documentation for accuracy and completeness, and I agree with the above.

## 2021-01-15 ENCOUNTER — Encounter: Payer: Self-pay | Admitting: Hematology

## 2021-01-15 ENCOUNTER — Other Ambulatory Visit: Payer: Self-pay

## 2021-01-15 ENCOUNTER — Inpatient Hospital Stay: Payer: Medicare Other | Attending: Nurse Practitioner | Admitting: Hematology

## 2021-01-15 ENCOUNTER — Telehealth: Payer: Self-pay | Admitting: Hematology

## 2021-01-15 ENCOUNTER — Inpatient Hospital Stay: Payer: Medicare Other

## 2021-01-15 DIAGNOSIS — C770 Secondary and unspecified malignant neoplasm of lymph nodes of head, face and neck: Secondary | ICD-10-CM | POA: Insufficient documentation

## 2021-01-15 DIAGNOSIS — Z87891 Personal history of nicotine dependence: Secondary | ICD-10-CM | POA: Insufficient documentation

## 2021-01-15 DIAGNOSIS — Z171 Estrogen receptor negative status [ER-]: Secondary | ICD-10-CM | POA: Diagnosis not present

## 2021-01-15 DIAGNOSIS — C7989 Secondary malignant neoplasm of other specified sites: Secondary | ICD-10-CM | POA: Insufficient documentation

## 2021-01-15 DIAGNOSIS — Z79899 Other long term (current) drug therapy: Secondary | ICD-10-CM | POA: Diagnosis not present

## 2021-01-15 DIAGNOSIS — Z5112 Encounter for antineoplastic immunotherapy: Secondary | ICD-10-CM | POA: Diagnosis not present

## 2021-01-15 DIAGNOSIS — C3490 Malignant neoplasm of unspecified part of unspecified bronchus or lung: Secondary | ICD-10-CM | POA: Diagnosis not present

## 2021-01-15 DIAGNOSIS — C3432 Malignant neoplasm of lower lobe, left bronchus or lung: Secondary | ICD-10-CM | POA: Diagnosis not present

## 2021-01-15 DIAGNOSIS — C7951 Secondary malignant neoplasm of bone: Secondary | ICD-10-CM | POA: Diagnosis not present

## 2021-01-15 DIAGNOSIS — E860 Dehydration: Secondary | ICD-10-CM | POA: Insufficient documentation

## 2021-01-15 DIAGNOSIS — C50212 Malignant neoplasm of upper-inner quadrant of left female breast: Secondary | ICD-10-CM | POA: Insufficient documentation

## 2021-01-15 DIAGNOSIS — C3492 Malignant neoplasm of unspecified part of left bronchus or lung: Secondary | ICD-10-CM

## 2021-01-15 DIAGNOSIS — Z95828 Presence of other vascular implants and grafts: Secondary | ICD-10-CM

## 2021-01-15 DIAGNOSIS — Z5111 Encounter for antineoplastic chemotherapy: Secondary | ICD-10-CM | POA: Insufficient documentation

## 2021-01-15 LAB — CBC WITH DIFFERENTIAL (CANCER CENTER ONLY)
Abs Immature Granulocytes: 0.05 10*3/uL (ref 0.00–0.07)
Basophils Absolute: 0.1 10*3/uL (ref 0.0–0.1)
Basophils Relative: 0 %
Eosinophils Absolute: 0.5 10*3/uL (ref 0.0–0.5)
Eosinophils Relative: 3 %
HCT: 37.8 % (ref 36.0–46.0)
Hemoglobin: 12.9 g/dL (ref 12.0–15.0)
Immature Granulocytes: 0 %
Lymphocytes Relative: 7 %
Lymphs Abs: 1.4 10*3/uL (ref 0.7–4.0)
MCH: 29.8 pg (ref 26.0–34.0)
MCHC: 34.1 g/dL (ref 30.0–36.0)
MCV: 87.3 fL (ref 80.0–100.0)
Monocytes Absolute: 1.4 10*3/uL — ABNORMAL HIGH (ref 0.1–1.0)
Monocytes Relative: 7 %
Neutro Abs: 17.1 10*3/uL — ABNORMAL HIGH (ref 1.7–7.7)
Neutrophils Relative %: 83 %
Platelet Count: 339 10*3/uL (ref 150–400)
RBC: 4.33 MIL/uL (ref 3.87–5.11)
RDW: 12.9 % (ref 11.5–15.5)
WBC Count: 20.6 10*3/uL — ABNORMAL HIGH (ref 4.0–10.5)
nRBC: 0 % (ref 0.0–0.2)

## 2021-01-15 LAB — CMP (CANCER CENTER ONLY)
ALT: 9 U/L (ref 0–44)
AST: 16 U/L (ref 15–41)
Albumin: 3 g/dL — ABNORMAL LOW (ref 3.5–5.0)
Alkaline Phosphatase: 159 U/L — ABNORMAL HIGH (ref 38–126)
Anion gap: 11 (ref 5–15)
BUN: 16 mg/dL (ref 8–23)
CO2: 29 mmol/L (ref 22–32)
Calcium: 8.9 mg/dL (ref 8.9–10.3)
Chloride: 92 mmol/L — ABNORMAL LOW (ref 98–111)
Creatinine: 0.62 mg/dL (ref 0.44–1.00)
GFR, Estimated: >60 mL/min (ref >60)
Glucose, Bld: 113 mg/dL — ABNORMAL HIGH (ref 70–99)
Potassium: 3.3 mmol/L — ABNORMAL LOW (ref 3.5–5.1)
Sodium: 132 mmol/L — ABNORMAL LOW (ref 135–145)
Total Bilirubin: 0.3 mg/dL (ref 0.3–1.2)
Total Protein: 6.6 g/dL (ref 6.5–8.1)

## 2021-01-15 MED ORDER — POTASSIUM CHLORIDE CRYS ER 10 MEQ PO TBCR: 10.0000 meq | EXTENDED_RELEASE_TABLET | Freq: Every day | ORAL | 0 refills | Status: DC

## 2021-01-15 MED ORDER — SODIUM CHLORIDE 0.9% FLUSH
10.0000 mL | Freq: Once | INTRAVENOUS | Status: AC
  Administered 2021-01-15: 10 mL via INTRAVENOUS
  Filled 2021-01-15: qty 10

## 2021-01-15 MED ORDER — HEPARIN SOD (PORK) LOCK FLUSH 100 UNIT/ML IV SOLN
500.0000 [IU] | Freq: Once | INTRAVENOUS | Status: AC
  Administered 2021-01-15: 500 [IU] via INTRAVENOUS
  Filled 2021-01-15: qty 5

## 2021-01-15 NOTE — Telephone Encounter (Signed)
Scheduled per los. Gave avs and calendar  

## 2021-01-15 NOTE — Patient Instructions (Signed)

## 2021-01-16 ENCOUNTER — Telehealth: Payer: Self-pay | Admitting: *Deleted

## 2021-01-16 NOTE — Telephone Encounter (Signed)
Pt called asking about having teeth cleaned.  Encouraged not to have cleaned by dental hygienist while on treatment due to risk of infections.

## 2021-01-18 ENCOUNTER — Telehealth: Payer: Self-pay | Admitting: Emergency Medicine

## 2021-01-18 NOTE — Telephone Encounter (Signed)
A Randomized pragmatic Chair-Based Home Exercise Intervention for Mitigating Cancer-Related Fatigue in Older Adults Undergoing Chemotherapy for Advanced Disease  01/18/21  Called patient to follow up concerning this study.  Patient has not received forms in the mail yet.  Patient did not have any questions at this time.  Will plan to call the patient back early next week.  Clabe Seal Clinical Research Coordinator I  01/18/21  2:12 PM

## 2021-01-23 ENCOUNTER — Telehealth: Payer: Self-pay | Admitting: Emergency Medicine

## 2021-01-23 ENCOUNTER — Ambulatory Visit (HOSPITAL_COMMUNITY): Payer: Medicare Other

## 2021-01-23 NOTE — Telephone Encounter (Signed)
A Randomized pragmatic Chair-Based Home Exercise Intervention for Mitigating Cancer-Related Fatigue in Older Adults Undergoing Chemotherapy for Advanced Disease  01/23/21  Called patient to follow up concerning this study.  No answer, left voicemail requesting return call.  Clabe Seal Clinical Research Coordinator I  01/23/21  10:04 AM

## 2021-01-24 NOTE — Progress Notes (Signed)
[facility]   [phone] [phone]) [phone]   Clinic Follow up Note   Patient Care Team: [name], MD as PCP - General (Family Medicine) Collene Gobble, MD (Pulmonary Disease) Erroll Luna, MD as Consulting Physician (General Surgery) Truitt Merle, MD as Consulting Physician (Hematology)  Date of Service:  01/29/2021  CHIEF COMPLAINT: f/u ofmetastatic lung cancer  SUMMARY OF ONCOLOGIC HISTORY: Oncology History Overview Note  Cancer Staging Malignant neoplasm of upper-inner quadrant of left breast in female, estrogen receptor negative ([place]) Staging form: Breast, AJCC 8th Edition - Clinical stage from 11/01/2020: Stage IB (cT1c, cN0, cM0, G3, ER-, PR-, HER2-) - Signed by Truitt Merle, MD on 11/08/2020 Stage prefix: Initial diagnosis - Pathologic stage from 11/21/2020: Stage IB (pT1c, pN0, cM0, G3, ER-, PR-, HER2-) - Signed by [name], NP on 12/20/2020 Stage prefix: Initial diagnosis Histologic grading system: 3 grade system  Non-small cell lung cancer ([place]) Staging form: Lung, AJCC 7th Edition - Clinical: Stage IA (Free text: Ia) - Signed by [name], MD on 12/28/2013 Laterality: Left Cancer stage: Ia - Pathologic: Ia - Signed by [name], MD on 12/28/2013 Laterality: Left Cancer stage: Ia    Non-small cell lung cancer ([place])  09/02/2011 Imaging   CT Chest  IMPRESSION:   1.  Interval clearing of right upper lobe pneumonia.  2.  Left upper lobe nodule is unchanged in the short interval from  07/29/2011.  Follow-up could be performed in 3 months to ensure  continued stability. This recommendation follows the consensus  statement: Guidelines for Management of Small Pulmonary Nodules  Detected on CT Scans:  A Statement from the [place] as  published in Radiology 2005; 237:395-400.  Available online at:  [email or web address]  3.  Additional scattered pulmonary  nodules can be reevaluated on  future imaging as well.    11/06/2011 Imaging   PET IMPRESSION:   1.  Mild hypermetabolism which projects minimally cephalad to, but  is felt to correspond to the left upper lobe lung nodule.  Although  not within malignant range, given the small size of the nodule,  malignancy cannot be excluded.  Possible mild interval enlargement  of the nodule since 09/02/2011.  Consider repeat standard chest CT  to confirm interval enlargement.  Especially if interval  enlargement has occurred, tissue sampling would be suggested.  Alternatively, 98-monthfollow-up chest CT could be performed.  2.  No evidence of thoracic nodal or extrathoracic disease.  3. Vague hypermetabolism corresponding to a prominent right lobe of  the thyroid. Nonspecific.  Consider ultrasound correlation.IMPRESSION:   1.  Mild hypermetabolism which projects minimally cephalad to, but  is felt to correspond to the left upper lobe lung nodule.  Although  not within malignant range, given the small size of the nodule,  malignancy cannot be excluded.  Possible mild interval enlargement  of the nodule since 09/02/2011.  Consider repeat standard chest CT  to confirm interval enlargement.  Especially if interval  enlargement has occurred, tissue sampling would be suggested.  Alternatively, 312-monthollow-up chest CT could be performed.  2.  No evidence of thoracic nodal or extrathoracic disease.  3. Vague hypermetabolism corresponding to a prominent right lobe of  the thyroid. Nonspecific.  Consider ultrasound correlation.   12/17/2011 Surgery   Thoracoscopy by Dr [name] Diagnosis 1. Lung, wedge biopsy/resection, left upper lobe - INVASIVE WELL-DIFFERENTIATED ADENOCARCINOMA, SPANNING 1.2 CM. - NO LYMPH VASCULAR INVASION IDENTIFIED. - PLEURA IS UNINVOLVED. - MARGINS  ARE NEGATIVE. - SEE ONCOLOGY TEMPLATE. 2. Lymph node, biopsy, level 9 - ONE BENIGN LYMPH NODE WITH NO TUMOR SEEN  (0/1). 3. Lymph node, biopsy, level 9 - ONE BENIGN LYMPH NODE WITH NO TUMOR SEEN (0/1). 4. Lymph node, biopsy, 11 - ONE BENIGN LYMPH NODE WITH NO TUMOR SEEN (0/1). 5. Lymph node, biopsy, 11 #2 - ONE BENIGN LYMPH NODE WITH NO TUMOR SEEN (0/1). 6. Lymph node, biopsy, 5 - ONE BENIGN LYMPH NODE WITH NO TUMOR SEEN (0/1). 7. Lymph node, biopsy, 10 - ONE BENIGN LYMPH NODE WITH NO TUMOR SEEN (0/1). 8. Lymph node, biopsy, 11 #3 - ONE BENIGN LYMPH NODE WITH NO TUMOR SEEN (0/1). 9. Lung, resection (segmental or lobe), Remainder of left upper lobe - BENIGN LUNG PARENCHYMA WITH CHRONIC INFLAMMATION AND INCREASED PULMONARY MACROPHAGES. - THREE BENIGN LYMPH NODES IDENTIFIED (0/3). - NO TUMOR SEEN.    01/08/2012 Initial Diagnosis   Non-small cell lung cancer ([place])   02/10/2018 Imaging   CT Chest IMPRESSION: 1. Enlarging irregular pleural-based solid 1.9 cm pulmonary nodule in the superior segment left lower lobe, suspicious for malignancy, either recurrent disease or a metachronous primary bronchogenic carcinoma. Consider PET-CT for further characterization. 2. Additional scattered bilateral pulmonary nodules are stable and considered benign. 3. No noncontrast CT evidence of thoracic adenopathy. 4. Three-vessel coronary atherosclerosis. 5. Nonobstructing left nephrolithiasis.   Aortic Atherosclerosis (ICD10-I70.0) and Emphysema (ICD10-J43.9).   02/18/2018 PET scan   IMPRESSION: 1. Hypermetabolic apical nodule in the superior segment left lower lobe is highly worrisome for bronchogenic carcinoma. 2. No additional areas of abnormal hypermetabolism in the neck, chest, abdomen or pelvis. 3. Aortic atherosclerosis (ICD10-170.0). Coronary artery calcification. 4.  Emphysema (ICD10-J43.9). 5. Left renal stone.     03/06/2018 Pathology Results   Lung Biopsy  Diagnosis Lung, needle/core biopsy(ies), Left Lower Lobe - ADENOCARCINOMA. Microscopic Comment There is likely sufficient tissue for  additional studies if requested (block 1B). Dr. Lyndon Code has reviewed the case.   04/08/2018 Surgery   Left Video assisted Thoracoscopy by Dr Roxan Hockey  Diagnosis Lung, wedge biopsy/resection, Left Lower Lobe - INVASIVE ADENOCARCINOMA, MODERATELY DIFFERENTIATED, SPANNING 1.3 CM. - ADENOCARCINOMA INVOLVES VISCERAL PLEURA. - ONE BENIGN INTRAPULMONARY LYMPH NODE (0/1). - LYMPHOVASCULAR INVASION IS IDENTIFIED, FOCAL. - THE SURGICAL RESECTION MARGINS ARE NEGATIVE FOR CARCINOMA. - SEE ONCOLOGY TABLE BELOW.    08/01/2020 Imaging   CT Chest  IMPRESSION: Stable postop changes from prior left upper lobectomy. Stable small bilateral pulmonary nodules. No new or progressive disease within the thorax.   Aortic Atherosclerosis (ICD10-I70.0) and Emphysema (ICD10-J43.9).   Malignant neoplasm of upper-inner quadrant of left breast in female, estrogen receptor negative ([place])  10/18/2020 Mammogram   Mammogram 10/18/20 at [facility] FINDINGS:  Cc and MLO views of bilateral breasts are submitted. There is a  spiculated mass in the palpable area upper left breast. The right  breast is negative.   Targeted ultrasound is performed, showing 1.2 x 0.9 x 1.2 cm  spiculated hypoechoic mass at the left breast 11 o'clock 8 cm from  nipple palpable area. This correlates to the mammographic mass.  Ultrasound of the left axilla is negative.   IMPRESSION:  Highly suspicious findings.     10/26/2020 Initial Biopsy   Final Pathologic Diagnosis  10/26/20 at [facility] BREAST, LEFT 1:00 8 CM FROM NIPPLE, NEEDLE BIOPSY:              INVASIVE DUCTAL CARCINOMA, NOTTINGHAM GRADE 3. Comment   The Nottingham grade is 3 (3-tubule formation, 3-nuclear atypia, 3-mitoses).  This case was reviewed by Dr. Vicenta Dunning and she is in essential agreement with the above diagnosis.  Final Pathologic Diagnosis   Prognostic Markers in Cancer   Block Number:  [id number] Diagnosis:  Invasive ductal carcinoma     Results:   Estrogen  Receptor:  Negative (<1%)   Progesterone Receptor:  Negative (<1%)   Her2:  Negative (score=1+)     Ki-67:  Percentage of tumor cells with nuclear positivity: 60 %      11/01/2020 Cancer Staging   Staging form: Breast, AJCC 8th Edition - Clinical stage from 11/01/2020: Stage IB (cT1c, cN0, cM0, G3, ER-, PR-, HER2-) - Signed by Truitt Merle, MD on 11/08/2020 Stage prefix: Initial diagnosis   11/08/2020 Initial Diagnosis   Malignant neoplasm of upper-inner quadrant of left breast in female, estrogen receptor negative ([place])   11/21/2020 Surgery   LEFT BREAST LUMPECTOMY WITH RADIOACTIVE SEED AND LEFT SENTINEL LYMPH NODE [place] and PAC placement by Dr Brantley Stage    11/21/2020 Pathology Results   FINAL MICROSCOPIC DIAGNOSIS:   A. BREAST, LEFT, LUMPECTOMY:  - Invasive ductal carcinoma, grade 3, 1.3 cm  - The anterior resection margin is widely involved by carcinoma  - Carcinoma is 0.2 cm from posterior margin  - Biopsy site changes  - See oncology table   B. BREAST, LEFT ADDITIONAL SUPERIOR MARGIN, EXCISION:  - Unremarkable fibroadipose tissue, negative for carcinoma   C. BREAST, LEFT ADDITIONAL LATERAL MARGIN, EXCISION:  - Unremarkable fibroadipose tissue, negative for carcinoma   D. BREAST, LEFT ADDITIONAL INFEROMEDIAL MARGIN, EXCISION:  - Unremarkable fibroadipose tissue, negative for carcinoma   E. LYMPH NODE, LEFT AXILLARY, SENTINEL, EXCISION:  - Lymph node, negative for carcinoma (0/1)    11/21/2020 Cancer Staging   Staging form: Breast, AJCC 8th Edition - Pathologic stage from 11/21/2020: Stage IB (pT1c, pN0, cM0, G3, ER-, PR-, HER2-) - Signed by [name], NP on 12/20/2020 Stage prefix: Initial diagnosis Histologic grading system: 3 grade system   Metastatic lung cancer (metastasis from lung to other site), left ([place])  12/11/2020 Imaging   CT Chest  IMPRESSION: 1. Signs of tumor recurrence within the apical portion of the left hemithorax with large rind of increased  soft tissue. Tumor extends into the left upper thoracic spine paravertebral soft tissues. 2. Pleural spread of disease is noted with mild progressive pleural thickening overlying the posterior and lateral left lower lung. 3. Interval development of increased interstitial markings within the left upper lobe adjacent to mass. In the absence of interval external beam radiation (since 08/01/2020) this is suspicious for lymphangitic spread of tumor. 4. Progressive soft tissue mass the within the paravertebral right upper lobe concerning for metastatic disease. 5. New enlarged anterior mediastinal and left supraclavicular lymph nodes compatible with metastatic adenopathy. 6. Indeterminate soft tissue attenuating nodular density between the upper pole of left kidney and left adrenal gland. Cannot exclude nodal metastasis or adrenal gland metastasis. 7. Aortic atherosclerosis.   Aortic Atherosclerosis (ICD10-I70.0).   12/22/2020 Pathology Results   A. LYMPH NODE, LEFT NECK, NEEDLE CORE BIOPSY:  - Metastatic adenocarcinoma.   COMMENT:   CK7 is positive.  CK20, TTF-1, Napsin-A, CDX-2, PAX 8, GATA-3 and ER are  negative.  The limited CK7 positive staining is compatible with origin  from lung; however, it does not exclude origin from other entities. If  applicable, there is likely sufficient tissue for ancillary studies.  Dr. Saralyn Pilar reviewed the case.    12/27/2020 Imaging   PET scan IMPRESSION: 1. Large  hypermetabolic mass occupying the left lung apex consistent with recurrent neoplasm. 2. Extensive metastatic disease involving the chest, abdomen, pelvis, bony structures, muscles and subcutaneous tissues.   01/07/2021 Initial Diagnosis   Metastatic lung cancer (metastasis from lung to other site), left ([place])   01/08/2021 -  Chemotherapy   First-line Carboplatin and Taxol with Bevacuzimab q3weeks starting 01/08/21. Due to significant fatigue and weight loss, reduced chemo to 2 weeks on/1  week off treatment starting with C2 on 01/29/21.       CURRENT THERAPY:  First-line Carboplatin and Taxol with Bevacuzimab q3weeks starting 01/08/21. Due to significant fatigue and weight loss, reduced chemo to 2 weeks on/1 week off treatment starting with C2 on 01/29/21.    INTERVAL HISTORY:  Valerie Mosley is here for a follow up. She was last seen by me 01/15/21. She presents to the clinic alone. She notes she is still having headaches and this mornings headaches has lead to nausea. She notes her brain MRI had to be rescheduled to 5/20, given issues with machine. She notes mild blurred vision. She notes her shoulder and back pain has traveled to her right side and lower. Her pain has improved in recent days after significant pain 3 days ago. Her pain since starting chemo has been up and down. She is on Oxycodone 66m q4hours. She will take 5-6 tabs daily. She notes she was prescribed Morphine but has not taken it. She notes she has more fatigue after first dose chemo. She also notes low appetite due to taste change. She tries frozen Ensure.     REVIEW OF SYSTEMS:   Constitutional: Denies fevers, chills or abnormal weight loss Eyes: Denies blurriness of vision Ears, nose, mouth, throat, and face: Denies mucositis or sore throat Respiratory: Denies cough, dyspnea or wheezes Cardiovascular: Denies palpitation, chest discomfort or lower extremity swelling Gastrointestinal:  Denies nausea, heartburn or change in bowel habits Skin: Denies abnormal skin rashes Lymphatics: Denies new lymphadenopathy or easy bruising Neurological:Denies numbness, tingling or new weaknesses Behavioral/Psych: Mood is stable, no new changes  All other systems were reviewed with the patient and are negative.  MEDICAL HISTORY:  Past Medical History:  Diagnosis Date  . Breast cancer ([place]   . Cancer ([place]    Stage IA non-small cell lung cancer, left upper lobectomy 12/2011  . COPD (chronic obstructive pulmonary  disease) ([place]   . Cough 07/29/11   started coughing up blood this am  . Cough   . Hypertension   . Kyphoscoliosis   . Pneumonia   . PONV (postoperative nausea and vomiting)   . Recurrent upper respiratory infection (URI)     SURGICAL HISTORY: Past Surgical History:  Procedure Laterality Date  . BREAST LUMPECTOMY WITH RADIOACTIVE SEED AND SENTINEL LYMPH NODE BIOPSY Left 11/21/2020   Procedure: LEFT BREAST LUMPECTOMY WITH RADIOACTIVE SEED AND LEFT SENTINEL LYMPH NODE [place]  Surgeon: CErroll Luna MD;  [place] [facility]  Service: General;  Laterality: Left;  PEC BLOCK; START TIME OF 11:00 AM FOR 90 MINUTES IN ROOM 2  . BRONCHOSCOPY    . CATARACT EXTRACTION, BILATERAL  2016  . EYE SURGERY    . INSERTION OF IBV VALVE Left 04/16/2018   Procedure: INSERTION OF INTERBRONCHIAL VALVE (IBV);  Surgeon: HMelrose Nakayama MD;  [place] [facility]  Service: Thoracic;  Laterality: Left;  . INSERTION OF IBV VALVE N/A 06/25/2018   Procedure: REMOVAL OF THREE INTERBRONCHIAL VALVE (IBV);  Surgeon: HMelrose Nakayama MD;  [place] [facility]  Service:  Thoracic;  Laterality: N/A;  . LOBECTOMY  12/17/2011   Procedure: LOBECTOMY;  Surgeon: [name], MD;  Location: [facility];  Service: Thoracic;  Laterality: Left;  (L)VATS, WEDGE RESECTION, LEFT UPPER LOBECTOMY,  Multiple node biopsies  . PORTACATH PLACEMENT Left 11/21/2020   Procedure: INSERTION PORT-A-CATH;  Surgeon: Erroll Luna, MD;  Location: [facility];  Service: General;  Laterality: Left;  . REMOVAL OF PLEURAL DRAINAGE CATHETER Left 05/06/2018   Procedure: REMOVAL OF CHEST TUBE;  Surgeon: [name], MD;  Location: [facility];  Service: Thoracic;  Laterality: Left;  . THYROID SURGERY  1980   1/2 thyroid removed - benign  . URETHRA SURGERY    . VIDEO ASSISTED THORACOSCOPY Left 04/08/2018   Procedure: REDO LEFT VIDEO ASSISTED THORACOSCOPY with left lower lobe wedge resection;  Surgeon: [name], MD;  Location: [facility];  Service:  Thoracic;  Laterality: Left;  Marland Kitchen VIDEO BRONCHOSCOPY N/A 04/16/2018   Procedure: VIDEO BRONCHOSCOPY;  Surgeon: [name], MD;  Location: [facility];  Service: Thoracic;  Laterality: N/A;  . VIDEO BRONCHOSCOPY N/A 06/25/2018   Procedure: VIDEO BRONCHOSCOPY;  Surgeon: [name], MD;  Location: [facility] OR;  Service: Thoracic;  Laterality: N/A;    I have reviewed the social history and family history with the patient and they are unchanged from previous note.  ALLERGIES:  has No Known Allergies.  MEDICATIONS:  Current Outpatient Medications  Medication Sig Dispense Refill  . acetaminophen (TYLENOL) 500 MG tablet Take 1,000 mg by mouth every 6 (six) hours as needed for moderate pain or headache.    Marland Kitchen acyclovir ointment (ZOVIRAX) 5 % Apply 1 application topically every 3 (three) hours as needed. (Patient taking differently: Apply 1 application topically every 3 (three) hours as needed (cold sores).) 15 g 0  . albuterol (VENTOLIN HFA) 108 (90 Base) MCG/ACT inhaler Inhale 2 puffs into the lungs every 4 (four) hours as needed for wheezing or shortness of breath. For shortness of breath 8.51 g 5  . amLODipine (NORVASC) 10 MG tablet Take 10 mg by mouth daily.    Marland Kitchen BEVESPI AEROSPHERE 9-4.8 MCG/ACT AERO INHALE 2 PUFFS INTO THE LUNGS 2 TIMES DAILY. (Patient taking differently: Inhale 2 puffs into the lungs in the morning and at bedtime.) 10.7 g 11  . colestipol (COLESTID) 1 g tablet Take 1 g by mouth daily as needed (diarrhea).    . dexamethasone (DECADRON) 4 MG tablet Take 2 tablets (8 mg total) by mouth daily. Start the day after carboplatin chemotherapy for 3 days. 30 tablet 1  . lidocaine-prilocaine (EMLA) cream Apply to affected area once 30 g 3  . mirtazapine (REMERON) 7.5 MG tablet Take 1 tablet (7.5 mg total) by mouth at bedtime. 30 tablet 0  . montelukast (SINGULAIR) 10 MG tablet Take 1 tablet (10 mg total) by mouth at bedtime. 30 tablet 5  . ondansetron (ZOFRAN) 8 MG tablet Take 1  tablet (8 mg total) by mouth 2 (two) times daily as needed for refractory nausea / vomiting. Start on day 3 after carboplatin chemo. 30 tablet 1  . oxyCODONE (OXY IR/ROXICODONE) 5 MG immediate release tablet Take 1 tablet (5 mg total) by mouth every 4 (four) hours as needed for severe pain. 90 tablet 0  . potassium chloride (KLOR-CON) 10 MEQ tablet Take 1 tablet (10 mEq total) by mouth daily. 7 tablet 0  . prochlorperazine (COMPAZINE) 10 MG tablet Take 1 tablet (10 mg total) by mouth every 6 (six) hours as needed (  Nausea or vomiting). 30 tablet 1  . sertraline (ZOLOFT) 50 MG tablet Take 50 mg by mouth at bedtime.    . traMADol (ULTRAM) 50 MG tablet Take 1 tablet (50 mg total) by mouth every 6 (six) hours as needed. 60 tablet 0   No current facility-administered medications for this visit.    PHYSICAL EXAMINATION: ECOG PERFORMANCE STATUS: 2 - Symptomatic, <50% confined to bed  Vitals:   01/29/21 0932  BP: 90/69  Pulse: (!) 113  Resp: 19  Temp: 97.8 F (36.6 C)  SpO2: 95%   Filed Weights   01/29/21 0932  Weight: 100 lb 9.6 oz (45.6 kg)    GENERAL:alert, no distress and comfortable SKIN: skin color, texture, turgor are normal, no rashes or significant lesions EYES: normal, Conjunctiva are pink and non-injected, sclera clear  NECK: supple, thyroid normal size, non-tender, without nodularity LYMPH:  no palpable lymphadenopathy in the cervical, axillary  LUNGS: clear to auscultation and percussion with normal breathing effort HEART: regular rhythm and no murmurs and no lower extremity edema (+) Tachycardia  ABDOMEN:abdomen soft, non-tender and normal bowel sounds (+) Epigastric tenderness Musculoskeletal:no cyanosis of digits and no clubbing  NEURO: alert & oriented x 3 with fluent speech, no focal motor/sensory deficits  LABORATORY DATA:  I have reviewed the data as listed CBC Latest Ref Rng & Units 01/29/2021 01/15/2021 01/08/2021  WBC 4.0 - 10.5 K/uL 16.3(H) 20.6(H) 9.7  Hemoglobin  12.0 - 15.0 g/dL 13.4 12.9 12.5  Hematocrit 36.0 - 46.0 % 39.5 37.8 36.3  Platelets 150 - 400 K/uL 493(H) 339 408(H)     CMP Latest Ref Rng & Units 01/29/2021 01/15/2021 01/08/2021  Glucose 70 - 99 mg/dL 89 113(H) 91  BUN 8 - 23 mg/dL 17 16 12   Creatinine 0.44 - 1.00 mg/dL 0.66 0.62 0.57  Sodium 135 - 145 mmol/L 135 132(L) 131(L)  Potassium 3.5 - 5.1 mmol/L 4.0 3.3(L) 2.9(L)  Chloride 98 - 111 mmol/L 98 92(L) 90(L)  CO2 22 - 32 mmol/L 22 29 28   Calcium 8.9 - 10.3 mg/dL 9.4 8.9 8.8(L)  Total Protein 6.5 - 8.1 g/dL 7.1 6.6 6.8  Total Bilirubin 0.3 - 1.2 mg/dL 0.4 0.3 0.5  Alkaline Phos 38 - 126 U/L 142(H) 159(H) 125  AST 15 - 41 U/L 17 16 18   ALT 0 - 44 U/L 8 9 9       RADIOGRAPHIC STUDIES: I have personally reviewed the radiological images as listed and agreed with the findings in the report. No results found.   ASSESSMENT & PLAN:  LEMA HEINKEL is a 70 y.o. female with    1.Metastatic Lung Adenocarcinoma, stage IV  -Chest CT 12/11/20 showeda large soft tissue mass in the left apical portion of the lung, with direct invasion of the left upper thoracic spine,probable pleural metastasis  -Left supraclavicular lymph node biopsyon4/8/22 confirmed metastatic adenocarcinoma -PET scan 12/27/20 showed: large hypermetabolic mass occupying left lung apex consistent with recurrent neoplasm; extensive metastatic disease involving chest, abdomen, pelvis, bony structures, muscles, and subcutaneous tissues.  -[facility]. Given her insurance may not cover this testing, I will contact them for appeal. Will order PD-L1seperately. She is agreeable, with insurance coverage, she will not be able to pay for it.  -I started her on first-line chemo with Carboplatin and Taxol with Bevacizumab q3weeks on 01/09/21, which will cover both lung and breast cancer if she has two primaries. Goal of therapy is to control disease and prolong  her life. Will  scan her in 3 months to monitor response.  -With recent increased headaches I will move her brain MRI to today or tomorrow, given lung cancer can lead to brain mets.  -S/p dose 1 of CT she has moderate fatigue and lower appetite from taste change. She has been losing more weight.  I discussed the option of lower dose chemo with CT 2 weeks on/1 week off.  Labs reviewed, WBC 16.3, plt 493K, Albumin 3.2, Alk Phos 142. Overall adequate to proceed with C2D1 CT and Beva today. Due to her tolerance issue, will change to [place] and taxol on day 1, 8 every 21 days. Proceed with Day 8 next week.  -F/u next week.   2. Pain in bilateral scapula secondary to #1 -She has pain to her left scapula, rated 5-6/10on4/14/22, at location of metastatic tumor -Dr. Saul Fordyce with patient in 11/3020 to hold on palliative Radiation for now.  -She has had to increase her Oxycodone 73m to q4hours and takes 5-6 tabs daily. For better control, I recommend she start long acting MS Contin [id number]. She can continue Oxycodone 48m67ms needed for breakthrough pain (01/29/21). She is agreeable.   -With pain medication, she knows to watch for constipation and use stool softener.  -Since starting chemo this pain has improved, but will fluctuate with back and shoulder pain.  -She is still on Zoloft. She has been advised to not take her sedative medications at the same time.  -She continues to have moderate intermittent headaches. She does not blurred vision. Will obtain MRI brain sooner. Will given Tylenol in clinic today (01/29/21).   3. Weight loss, Fatigue, Secondary to chemo  -She has taste change and low appetite from chemo. She has lost more weight lately.  -I will refer her to Dietician for management  -I will reduce her chemo dose with weekly treatment 2 weeks on/1 week off starting with C2. She is agreeable.   4.Malignant neoplasm of upper inner quadrant of left breast,invasiveadenocarcinoma Stage IB, p1cN0Mx,  ER-/PR-/HER2-, Grade III, vs mets from her lung cancer  -She palpated her breast mass herself in mid1/2022. Her2/2022m79mgram showeda1.2cm mass in the 11:00 position of her left breast.2/10/22Biopsy showed invasive ductal carcinoma, triple negative -She underwent left breast lumpectomy and sentinel lymph node biopsyon 11/21/20.Her surgical pathology revealed a 1.3 cm grade 3 invasive ductal carcinoma, with negative lymph nodes. The anterior resection margin was positive. -given her PET scan findings of multiple subcutaneous and intramuscular metastasis, I suspect this is part of her metastatic lung cancer  -I discussed chemo for her lung met will is also effective regimen for triple negative cancer -Due to her metastatic lung cancer, will hold on adjuvant radiation for now.  5.History ofLULstage IA, T1 a, N0, M0) non-small cell lung cancer, adenocarcinomaDxin 2013 + LLLstage Ib (T2, and 0, M0) non-small cell lung cancer, adenocarcinomaDx 02/2018.  6. COPD and HTN -She quit smoking in 2013 after she was diagnosed with lung cancer.  -She has SOB and uses inhaler daily. Not on oxygen.   Plan -Labs reviewed, adequate to proceed with C2D1 CT and beva with weekly dose chemo today -Lab, flush, F/u and C2D8 chemo carboplatin and Taxol next week -Brain MRI this week  -Contact her insurance for FO/PD-L1 appeal   No problem-specific Assessment & Plan notes found for this encounter.   No orders of the defined types were placed in this encounter.  All questions were answered. The patient knows to call the clinic with  any problems, questions or concerns. No barriers to learning was detected. The total time spent in the appointment was 30 minutes.     Truitt Merle, MD 01/29/2021   I, Joslyn Devon, am acting as scribe for Truitt Merle, MD.   I have reviewed the above documentation for accuracy and completeness, and I agree with the above.

## 2021-01-26 ENCOUNTER — Telehealth: Payer: Self-pay

## 2021-01-26 ENCOUNTER — Other Ambulatory Visit: Payer: Self-pay | Admitting: Hematology

## 2021-01-26 MED ORDER — OXYCODONE HCL 5 MG PO TABS: 5.0000 mg | ORAL_TABLET | ORAL | 0 refills | Status: DC | PRN

## 2021-01-26 NOTE — Telephone Encounter (Signed)
I spoke with Valerie Mosley today regarding her pain.  She states that the pain has moved from her left scapula to the right scapula.  She states this am the pain was much more than previous days.  She usually takes 3 oxycodone per day put has already take 3 today.  She is requesting refill for oxycodone.

## 2021-01-26 NOTE — Telephone Encounter (Signed)
Please refuse as this is already filled. Gardiner Rhyme, RN

## 2021-01-29 ENCOUNTER — Inpatient Hospital Stay: Payer: Medicare Other

## 2021-01-29 ENCOUNTER — Inpatient Hospital Stay: Payer: Medicare Other | Admitting: Nutrition

## 2021-01-29 ENCOUNTER — Other Ambulatory Visit: Payer: Self-pay

## 2021-01-29 ENCOUNTER — Other Ambulatory Visit: Payer: Medicare Other

## 2021-01-29 ENCOUNTER — Inpatient Hospital Stay (HOSPITAL_BASED_OUTPATIENT_CLINIC_OR_DEPARTMENT_OTHER): Payer: Medicare Other | Admitting: Hematology

## 2021-01-29 VITALS — HR 108

## 2021-01-29 VITALS — BP 90/69 | HR 113 | Temp 97.8°F | Resp 19 | Wt 100.6 lb

## 2021-01-29 DIAGNOSIS — C770 Secondary and unspecified malignant neoplasm of lymph nodes of head, face and neck: Secondary | ICD-10-CM | POA: Diagnosis not present

## 2021-01-29 DIAGNOSIS — Z5111 Encounter for antineoplastic chemotherapy: Secondary | ICD-10-CM | POA: Diagnosis not present

## 2021-01-29 DIAGNOSIS — C50212 Malignant neoplasm of upper-inner quadrant of left female breast: Secondary | ICD-10-CM | POA: Diagnosis not present

## 2021-01-29 DIAGNOSIS — Z5112 Encounter for antineoplastic immunotherapy: Secondary | ICD-10-CM | POA: Diagnosis not present

## 2021-01-29 DIAGNOSIS — C3492 Malignant neoplasm of unspecified part of left bronchus or lung: Secondary | ICD-10-CM | POA: Diagnosis not present

## 2021-01-29 DIAGNOSIS — Z171 Estrogen receptor negative status [ER-]: Secondary | ICD-10-CM

## 2021-01-29 DIAGNOSIS — C3432 Malignant neoplasm of lower lobe, left bronchus or lung: Secondary | ICD-10-CM | POA: Diagnosis not present

## 2021-01-29 DIAGNOSIS — C7989 Secondary malignant neoplasm of other specified sites: Secondary | ICD-10-CM | POA: Diagnosis not present

## 2021-01-29 DIAGNOSIS — Z95828 Presence of other vascular implants and grafts: Secondary | ICD-10-CM

## 2021-01-29 DIAGNOSIS — C7951 Secondary malignant neoplasm of bone: Secondary | ICD-10-CM | POA: Diagnosis not present

## 2021-01-29 LAB — CMP (CANCER CENTER ONLY)
ALT: 8 U/L (ref 0–44)
AST: 17 U/L (ref 15–41)
Albumin: 3.2 g/dL — ABNORMAL LOW (ref 3.5–5.0)
Alkaline Phosphatase: 142 U/L — ABNORMAL HIGH (ref 38–126)
Anion gap: 15 (ref 5–15)
BUN: 17 mg/dL (ref 8–23)
CO2: 22 mmol/L (ref 22–32)
Calcium: 9.4 mg/dL (ref 8.9–10.3)
Chloride: 98 mmol/L (ref 98–111)
Creatinine: 0.66 mg/dL (ref 0.44–1.00)
GFR, Estimated: >60 mL/min (ref >60)
Glucose, Bld: 89 mg/dL (ref 70–99)
Potassium: 4 mmol/L (ref 3.5–5.1)
Sodium: 135 mmol/L (ref 135–145)
Total Bilirubin: 0.4 mg/dL (ref 0.3–1.2)
Total Protein: 7.1 g/dL (ref 6.5–8.1)

## 2021-01-29 LAB — CBC WITH DIFFERENTIAL (CANCER CENTER ONLY)
Abs Immature Granulocytes: 0.09 10*3/uL — ABNORMAL HIGH (ref 0.00–0.07)
Basophils Absolute: 0.1 10*3/uL (ref 0.0–0.1)
Basophils Relative: 1 %
Eosinophils Absolute: 0 10*3/uL (ref 0.0–0.5)
Eosinophils Relative: 0 %
HCT: 39.5 % (ref 36.0–46.0)
Hemoglobin: 13.4 g/dL (ref 12.0–15.0)
Immature Granulocytes: 1 %
Lymphocytes Relative: 7 %
Lymphs Abs: 1.1 10*3/uL (ref 0.7–4.0)
MCH: 29.9 pg (ref 26.0–34.0)
MCHC: 33.9 g/dL (ref 30.0–36.0)
MCV: 88.2 fL (ref 80.0–100.0)
Monocytes Absolute: 0.7 10*3/uL (ref 0.1–1.0)
Monocytes Relative: 4 %
Neutro Abs: 14.3 10*3/uL — ABNORMAL HIGH (ref 1.7–7.7)
Neutrophils Relative %: 87 %
Platelet Count: 493 10*3/uL — ABNORMAL HIGH (ref 150–400)
RBC: 4.48 MIL/uL (ref 3.87–5.11)
RDW: 14.4 % (ref 11.5–15.5)
WBC Count: 16.3 10*3/uL — ABNORMAL HIGH (ref 4.0–10.5)
nRBC: 0 % (ref 0.0–0.2)

## 2021-01-29 MED ORDER — PALONOSETRON HCL INJECTION 0.25 MG/5ML
0.2500 mg | Freq: Once | INTRAVENOUS | Status: AC
  Administered 2021-01-29: 0.25 mg via INTRAVENOUS

## 2021-01-29 MED ORDER — SODIUM CHLORIDE 0.9 % IV SOLN
10.0000 mg | Freq: Once | INTRAVENOUS | Status: AC
  Administered 2021-01-29: 10 mg via INTRAVENOUS
  Filled 2021-01-29: qty 10

## 2021-01-29 MED ORDER — SODIUM CHLORIDE 0.9% FLUSH
10.0000 mL | INTRAVENOUS | Status: DC | PRN
  Administered 2021-01-29: 10 mL
  Filled 2021-01-29: qty 10

## 2021-01-29 MED ORDER — SODIUM CHLORIDE 0.9 % IV SOLN
Freq: Once | INTRAVENOUS | Status: AC
  Filled 2021-01-29: qty 250

## 2021-01-29 MED ORDER — PALONOSETRON HCL INJECTION 0.25 MG/5ML
INTRAVENOUS | Status: AC
  Filled 2021-01-29: qty 5

## 2021-01-29 MED ORDER — FAMOTIDINE 20 MG IN NS 100 ML IVPB
INTRAVENOUS | Status: AC
  Filled 2021-01-29: qty 100

## 2021-01-29 MED ORDER — FAMOTIDINE 20 MG IN NS 100 ML IVPB
20.0000 mg | Freq: Once | INTRAVENOUS | Status: AC
  Administered 2021-01-29: 20 mg via INTRAVENOUS

## 2021-01-29 MED ORDER — SODIUM CHLORIDE 0.9 % IV SOLN
Freq: Once | INTRAVENOUS | Status: AC
Start: 2021-01-29 — End: 2021-01-29
  Filled 2021-01-29: qty 250

## 2021-01-29 MED ORDER — SODIUM CHLORIDE 0.9 % IV SOLN
80.0000 mg/m2 | Freq: Once | INTRAVENOUS | Status: AC
  Administered 2021-01-29: 126 mg via INTRAVENOUS
  Filled 2021-01-29: qty 21

## 2021-01-29 MED ORDER — SODIUM CHLORIDE 0.9 % IV SOLN
150.0000 mg | Freq: Once | INTRAVENOUS | Status: AC
  Administered 2021-01-29: 150 mg via INTRAVENOUS
  Filled 2021-01-29: qty 150

## 2021-01-29 MED ORDER — SODIUM CHLORIDE 0.9 % IV SOLN
102.4500 mg | Freq: Once | INTRAVENOUS | Status: AC
  Administered 2021-01-29: 100 mg via INTRAVENOUS
  Filled 2021-01-29: qty 10

## 2021-01-29 MED ORDER — HEPARIN SOD (PORK) LOCK FLUSH 100 UNIT/ML IV SOLN
500.0000 [IU] | Freq: Once | INTRAVENOUS | Status: AC | PRN
  Administered 2021-01-29: 500 [IU]
  Filled 2021-01-29: qty 5

## 2021-01-29 MED ORDER — DIPHENHYDRAMINE HCL 50 MG/ML IJ SOLN
50.0000 mg | Freq: Once | INTRAMUSCULAR | Status: AC
Start: 2021-01-29 — End: 2021-01-29
  Administered 2021-01-29: 50 mg via INTRAVENOUS

## 2021-01-29 MED ORDER — SODIUM CHLORIDE 0.9% FLUSH
10.0000 mL | INTRAVENOUS | Status: DC | PRN
  Administered 2021-01-29: 10 mL via INTRAVENOUS
  Filled 2021-01-29: qty 10

## 2021-01-29 MED ORDER — SODIUM CHLORIDE 0.9 % IV SOLN
700.0000 mg | Freq: Once | INTRAVENOUS | Status: AC
  Administered 2021-01-29: 700 mg via INTRAVENOUS
  Filled 2021-01-29: qty 16

## 2021-01-29 MED ORDER — SODIUM CHLORIDE 0.9 % IV SOLN: 15.0000 mg/kg | Freq: Once | INTRAVENOUS | Status: DC

## 2021-01-29 MED ORDER — DIPHENHYDRAMINE HCL 50 MG/ML IJ SOLN
INTRAMUSCULAR | Status: AC
  Filled 2021-01-29: qty 1

## 2021-01-29 NOTE — Progress Notes (Signed)
Patient identified on the MST secondary to poor appetite and weight loss.  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 Burr Medico.  She is receiving carbo/Taxol/Bevacuzimab  Past medical history includes COPD and hypertension.  Medications include Remeron, Decadron, Zofran, Compazine.  Labs include albumin 3.2.  Height: 5 feet 4 inches. Weight: 100.6 pounds. Usual body weight: 120 pounds per patient.  Patient weighed 123 pounds November 08, 2020. BMI: 17.27. ECOG: 2.  Estimated nutrition needs: 1600-1800 cal, 70-85 g protein, 1.8 L fluid.  Nutrition diagnosis: Severe malnutrition related to % weight loss, depletion of fat and muscle mass in less than or equal to 50% energy intake for greater or equal to 5 days.  Patient meets criteria for severe malnutrition secondary to 18% weight loss in less than 3 months, depletion of both fat and muscle mass, and inadequate calorie intake based on dietary recall.  Patient is underweight. Patient reports her pain is controlled but remains at the same level.  She varies between constipation and diarrhea.  She currently denies nausea.  Reports she can eat frozen Ensure, ice cream, cheese, peanut butter, and eggs.  The only thing she ate yesterday was bacon, lettuce and tomato sandwich.  She has only been drinking 1 Ensure a day.  Intervention: Educated patient to consume high-calorie, high-protein meals and snacks frequently throughout the day. Recommended making high-calorie milkshake using Ensure complete.  Provided coupons. Encouraged bowel regimen. Provided nutrition fact sheets.  Questions answered.  Teach back method used.  Contact information given.  Monitoring, evaluation, goals: Patient will tolerate increased calories and protein to minimize further weight loss.  Next visit: Monday, June 6 during infusion.  **Disclaimer: This note was dictated with voice recognition  software. Similar sounding words can inadvertently be transcribed and this note may contain transcription errors which may not have been corrected upon publication of note.**

## 2021-01-29 NOTE — Progress Notes (Signed)
Per dr Burr Medico ok to treat with HR 108.

## 2021-01-29 NOTE — Progress Notes (Addendum)
Decrease Avastin to 700mg  based on weight loss. Updated weight in treatment plan for future Carbo/Taxol orders. MD removed Avastin orders for next cycle.  Raul Del [place], [place], BCPS, BCOP 01/29/2021 11:09 AM

## 2021-01-29 NOTE — Progress Notes (Signed)
Per Dr. Burr Medico, 500 ml NS over 1 hour due to decreased blood pressure.

## 2021-01-29 NOTE — Patient Instructions (Signed)
[facility]  Discharge Instructions: Thank you for choosing [facility] to provide your oncology and hematology care.   If you have a lab appointment with the [facility], please go directly to the [facility] and check in at the registration area.   Wear comfortable clothing and clothing appropriate for easy access to any Portacath or PICC line.   We strive to give you quality time with your provider. You may need to reschedule your appointment if you arrive late (15 or more minutes).  Arriving late affects you and other patients whose appointments are after yours.  Also, if you miss three or more appointments without notifying the office, you may be dismissed from the clinic at the provider's discretion.      For prescription refill requests, have your pharmacy contact our office and allow 72 hours for refills to be completed.    Today you received the following chemotherapy and/or immunotherapy agents: bevacizumab, paclitaxel, carboplatin    To help prevent nausea and vomiting after your treatment, we encourage you to take your nausea medication as directed.  BELOW ARE SYMPTOMS THAT SHOULD BE REPORTED IMMEDIATELY: . *FEVER GREATER THAN 100.4 F (38 C) OR HIGHER . *CHILLS OR SWEATING . *NAUSEA AND VOMITING THAT IS NOT CONTROLLED WITH YOUR NAUSEA MEDICATION . *UNUSUAL SHORTNESS OF BREATH . *UNUSUAL BRUISING OR BLEEDING . *URINARY PROBLEMS (pain or burning when urinating, or frequent urination) . *BOWEL PROBLEMS (unusual diarrhea, constipation, pain near the anus) . TENDERNESS IN MOUTH AND THROAT WITH OR WITHOUT PRESENCE OF ULCERS (sore throat, sores in mouth, or a toothache) . UNUSUAL RASH, SWELLING OR PAIN  . UNUSUAL VAGINAL DISCHARGE OR ITCHING   Items with * indicate a potential emergency and should be followed up as soon as possible or go to the Emergency Department if any problems should occur.  Please show the CHEMOTHERAPY ALERT  CARD or IMMUNOTHERAPY ALERT CARD at check-in to the Emergency Department and triage nurse.  Should you have questions after your visit or need to cancel or reschedule your appointment, please contact [facility]  Dept: [phone]  and follow the prompts.  Office hours are 8:00 a.m. to 4:30 p.m. Monday - Friday. Please note that voicemails left after 4:00 p.m. may not be returned until the following business day.  We are closed weekends and major holidays. You have access to a nurse at all times for urgent questions. Please call the main number to the clinic Dept: [phone] and follow the prompts.   For any non-urgent questions, you may also contact your provider using MyChart. We now offer e-Visits for anyone 80 and older to request care online for non-urgent symptoms. For details visit [email or web address].   Also download the MyChart app! Go to the app store, search "MyChart", open the app, select [facility], and log in with your MyChart username and password.  Due to Covid, a mask is required upon entering the hospital/clinic. If you do not have a mask, one will be given to you upon arrival. For doctor visits, patients may have 1 support person aged 75 or older with them. For treatment visits, patients cannot have anyone with them due to current Covid guidelines and our immunocompromised population.   Bevacizumab injection What is this medicine? BEVACIZUMAB (be va SIZ yoo mab) is a monoclonal antibody. It is used to treat many types of cancer. This medicine may be used for other purposes; ask your health care provider or  pharmacist if you have questions. COMMON BRAND NAME(S): Avastin, MVASI, Zirabev What should I tell my health care provider before I take this medicine? They need to know if you have any of these conditions:  diabetes  heart disease  high blood pressure  history of coughing up blood  prior anthracycline chemotherapy (e.g.,  doxorubicin, daunorubicin, epirubicin)  recent or ongoing radiation therapy  recent or planning to have surgery  stroke  an unusual or allergic reaction to bevacizumab, hamster proteins, mouse proteins, other medicines, foods, dyes, or preservatives  pregnant or trying to get pregnant  breast-feeding How should I use this medicine? This medicine is for infusion into a vein. It is given by a health care professional in a hospital or clinic setting. Talk to your pediatrician regarding the use of this medicine in children. Special care may be needed. Overdosage: If you think you have taken too much of this medicine contact a poison control center or emergency room at once. NOTE: This medicine is only for you. Do not share this medicine with others. What if I miss a dose? It is important not to miss your dose. Call your doctor or health care professional if you are unable to keep an appointment. What may interact with this medicine? Interactions are not expected. This list may not describe all possible interactions. Give your health care provider a list of all the medicines, herbs, non-prescription drugs, or dietary supplements you use. Also tell them if you smoke, drink alcohol, or use illegal drugs. Some items may interact with your medicine. What should I watch for while using this medicine? Your condition will be monitored carefully while you are receiving this medicine. You will need important blood work and urine testing done while you are taking this medicine. This medicine may increase your risk to bruise or bleed. Call your doctor or health care professional if you notice any unusual bleeding. Before having surgery, talk to your health care provider to make sure it is ok. This drug can increase the risk of poor healing of your surgical site or wound. You will need to stop this drug for 28 days before surgery. After surgery, wait at least 28 days before restarting this drug. Make sure  the surgical site or wound is healed enough before restarting this drug. Talk to your health care provider if questions. Do not become pregnant while taking this medicine or for 6 months after stopping it. Women should inform their doctor if they wish to become pregnant or think they might be pregnant. There is a potential for serious side effects to an unborn child. Talk to your health care professional or pharmacist for more information. Do not breast-feed an infant while taking this medicine and for 6 months after the last dose. This medicine has caused ovarian failure in some women. This medicine may interfere with the ability to have a child. You should talk to your doctor or health care professional if you are concerned about your fertility. What side effects may I notice from receiving this medicine? Side effects that you should report to your doctor or health care professional as soon as possible:  allergic reactions like skin rash, itching or hives, swelling of the face, lips, or tongue  chest pain or chest tightness  chills  coughing up blood  high fever  seizures  severe constipation  signs and symptoms of bleeding such as bloody or black, tarry stools; red or dark-brown urine; spitting up blood or brown material that looks  like coffee grounds; red spots on the skin; unusual bruising or bleeding from the eye, gums, or nose  signs and symptoms of a blood clot such as breathing problems; chest pain; severe, sudden headache; pain, swelling, warmth in the leg  signs and symptoms of a stroke like changes in vision; confusion; trouble speaking or understanding; severe headaches; sudden numbness or weakness of the face, arm or leg; trouble walking; dizziness; loss of balance or coordination  stomach pain  sweating  swelling of legs or ankles  vomiting  weight gain Side effects that usually do not require medical attention (report to your doctor or health care professional if  they continue or are bothersome):  back pain  changes in taste  decreased appetite  dry skin  nausea  tiredness This list may not describe all possible side effects. Call your doctor for medical advice about side effects. You may report side effects to FDA at 1-800-FDA-1088. Where should I keep my medicine? This drug is given in a hospital or clinic and will not be stored at home. NOTE: This sheet is a summary. It may not cover all possible information. If you have questions about this medicine, talk to your doctor, pharmacist, or health care provider.  2021 Elsevier/Gold Standard (2019-06-30 10:50:46)  Paclitaxel injection What is this medicine? PACLITAXEL (PAK li TAX el) is a chemotherapy drug. It targets fast dividing cells, like cancer cells, and causes these cells to die. This medicine is used to treat ovarian cancer, breast cancer, lung cancer, Kaposi's sarcoma, and other cancers. This medicine may be used for other purposes; ask your health care provider or pharmacist if you have questions. COMMON BRAND NAME(S): Onxol, Taxol What should I tell my health care provider before I take this medicine? They need to know if you have any of these conditions:  history of irregular heartbeat  liver disease  low blood counts, like low white cell, platelet, or red cell counts  lung or breathing disease, like asthma  tingling of the fingers or toes, or other nerve disorder  an unusual or allergic reaction to paclitaxel, alcohol, polyoxyethylated castor oil, other chemotherapy, other medicines, foods, dyes, or preservatives  pregnant or trying to get pregnant  breast-feeding How should I use this medicine? This drug is given as an infusion into a vein. It is administered in a hospital or clinic by a specially trained health care professional. Talk to your pediatrician regarding the use of this medicine in children. Special care may be needed. Overdosage: If you think you have  taken too much of this medicine contact a poison control center or emergency room at once. NOTE: This medicine is only for you. Do not share this medicine with others. What if I miss a dose? It is important not to miss your dose. Call your doctor or health care professional if you are unable to keep an appointment. What may interact with this medicine? Do not take this medicine with any of the following medications:  live virus vaccines This medicine may also interact with the following medications:  antiviral medicines for hepatitis, HIV or AIDS  certain antibiotics like erythromycin and clarithromycin  certain medicines for fungal infections like ketoconazole and itraconazole  certain medicines for seizures like carbamazepine, phenobarbital, phenytoin  gemfibrozil  nefazodone  rifampin  St. John's wort This list may not describe all possible interactions. Give your health care provider a list of all the medicines, herbs, non-prescription drugs, or dietary supplements you use. Also tell them if  you smoke, drink alcohol, or use illegal drugs. Some items may interact with your medicine. What should I watch for while using this medicine? Your condition will be monitored carefully while you are receiving this medicine. You will need important blood work done while you are taking this medicine. This medicine can cause serious allergic reactions. To reduce your risk you will need to take other medicine(s) before treatment with this medicine. If you experience allergic reactions like skin rash, itching or hives, swelling of the face, lips, or tongue, tell your doctor or health care professional right away. In some cases, you may be given additional medicines to help with side effects. Follow all directions for their use. This drug may make you feel generally unwell. This is not uncommon, as chemotherapy can affect healthy cells as well as cancer cells. Report any side effects. Continue your  course of treatment even though you feel ill unless your doctor tells you to stop. Call your doctor or health care professional for advice if you get a fever, chills or sore throat, or other symptoms of a cold or flu. Do not treat yourself. This drug decreases your body's ability to fight infections. Try to avoid being around people who are sick. This medicine may increase your risk to bruise or bleed. Call your doctor or health care professional if you notice any unusual bleeding. Be careful brushing and flossing your teeth or using a toothpick because you may get an infection or bleed more easily. If you have any dental work done, tell your dentist you are receiving this medicine. Avoid taking products that contain aspirin, acetaminophen, ibuprofen, naproxen, or ketoprofen unless instructed by your doctor. These medicines may hide a fever. Do not become pregnant while taking this medicine. Women should inform their doctor if they wish to become pregnant or think they might be pregnant. There is a potential for serious side effects to an unborn child. Talk to your health care professional or pharmacist for more information. Do not breast-feed an infant while taking this medicine. Men are advised not to father a child while receiving this medicine. This product may contain alcohol. Ask your pharmacist or healthcare provider if this medicine contains alcohol. Be sure to tell all healthcare providers you are taking this medicine. Certain medicines, like metronidazole and disulfiram, can cause an unpleasant reaction when taken with alcohol. The reaction includes flushing, headache, nausea, vomiting, sweating, and increased thirst. The reaction can last from 30 minutes to several hours. What side effects may I notice from receiving this medicine? Side effects that you should report to your doctor or health care professional as soon as possible:  allergic reactions like skin rash, itching or hives, swelling of  the face, lips, or tongue  breathing problems  changes in vision  fast, irregular heartbeat  high or low blood pressure  mouth sores  pain, tingling, numbness in the hands or feet  signs of decreased platelets or bleeding - bruising, pinpoint red spots on the skin, black, tarry stools, blood in the urine  signs of decreased red blood cells - unusually weak or tired, feeling faint or lightheaded, falls  signs of infection - fever or chills, cough, sore throat, pain or difficulty passing urine  signs and symptoms of liver injury like dark yellow or brown urine; general ill feeling or flu-like symptoms; light-colored stools; loss of appetite; nausea; right upper belly pain; unusually weak or tired; yellowing of the eyes or skin  swelling of the ankles, feet, hands  unusually slow heartbeat Side effects that usually do not require medical attention (report to your doctor or health care professional if they continue or are bothersome):  diarrhea  hair loss  loss of appetite  muscle or joint pain  nausea, vomiting  pain, redness, or irritation at site where injected  tiredness This list may not describe all possible side effects. Call your doctor for medical advice about side effects. You may report side effects to FDA at 1-800-FDA-1088. Where should I keep my medicine? This drug is given in a hospital or clinic and will not be stored at home. NOTE: This sheet is a summary. It may not cover all possible information. If you have questions about this medicine, talk to your doctor, pharmacist, or health care provider.  2021 Elsevier/Gold Standard (2019-08-04 13:37:23)  Carboplatin injection What is this medicine? CARBOPLATIN (KAR boe pla tin) is a chemotherapy drug. It targets fast dividing cells, like cancer cells, and causes these cells to die. This medicine is used to treat ovarian cancer and many other cancers. This medicine may be used for other purposes; ask your health  care provider or pharmacist if you have questions. COMMON BRAND NAME(S): Paraplatin What should I tell my health care provider before I take this medicine? They need to know if you have any of these conditions:  blood disorders  hearing problems  kidney disease  recent or ongoing radiation therapy  an unusual or allergic reaction to carboplatin, cisplatin, other chemotherapy, other medicines, foods, dyes, or preservatives  pregnant or trying to get pregnant  breast-feeding How should I use this medicine? This drug is usually given as an infusion into a vein. It is administered in a hospital or clinic by a specially trained health care professional. Talk to your pediatrician regarding the use of this medicine in children. Special care may be needed. Overdosage: If you think you have taken too much of this medicine contact a poison control center or emergency room at once. NOTE: This medicine is only for you. Do not share this medicine with others. What if I miss a dose? It is important not to miss a dose. Call your doctor or health care professional if you are unable to keep an appointment. What may interact with this medicine?  medicines for seizures  medicines to increase blood counts like filgrastim, pegfilgrastim, sargramostim  some antibiotics like amikacin, gentamicin, neomycin, streptomycin, tobramycin  vaccines Talk to your doctor or health care professional before taking any of these medicines:  acetaminophen  aspirin  ibuprofen  ketoprofen  naproxen This list may not describe all possible interactions. Give your health care provider a list of all the medicines, herbs, non-prescription drugs, or dietary supplements you use. Also tell them if you smoke, drink alcohol, or use illegal drugs. Some items may interact with your medicine. What should I watch for while using this medicine? Your condition will be monitored carefully while you are receiving this medicine.  You will need important blood work done while you are taking this medicine. This drug may make you feel generally unwell. This is not uncommon, as chemotherapy can affect healthy cells as well as cancer cells. Report any side effects. Continue your course of treatment even though you feel ill unless your doctor tells you to stop. In some cases, you may be given additional medicines to help with side effects. Follow all directions for their use. Call your doctor or health care professional for advice if you get a fever, chills or sore  throat, or other symptoms of a cold or flu. Do not treat yourself. This drug decreases your body's ability to fight infections. Try to avoid being around people who are sick. This medicine may increase your risk to bruise or bleed. Call your doctor or health care professional if you notice any unusual bleeding. Be careful brushing and flossing your teeth or using a toothpick because you may get an infection or bleed more easily. If you have any dental work done, tell your dentist you are receiving this medicine. Avoid taking products that contain aspirin, acetaminophen, ibuprofen, naproxen, or ketoprofen unless instructed by your doctor. These medicines may hide a fever. Do not become pregnant while taking this medicine. Women should inform their doctor if they wish to become pregnant or think they might be pregnant. There is a potential for serious side effects to an unborn child. Talk to your health care professional or pharmacist for more information. Do not breast-feed an infant while taking this medicine. What side effects may I notice from receiving this medicine? Side effects that you should report to your doctor or health care professional as soon as possible:  allergic reactions like skin rash, itching or hives, swelling of the face, lips, or tongue  signs of infection - fever or chills, cough, sore throat, pain or difficulty passing urine  signs of decreased  platelets or bleeding - bruising, pinpoint red spots on the skin, black, tarry stools, nosebleeds  signs of decreased red blood cells - unusually weak or tired, fainting spells, lightheadedness  breathing problems  changes in hearing  changes in vision  chest pain  high blood pressure  low blood counts - This drug may decrease the number of white blood cells, red blood cells and platelets. You may be at increased risk for infections and bleeding.  nausea and vomiting  pain, swelling, redness or irritation at the injection site  pain, tingling, numbness in the hands or feet  problems with balance, talking, walking  trouble passing urine or change in the amount of urine Side effects that usually do not require medical attention (report to your doctor or health care professional if they continue or are bothersome):  hair loss  loss of appetite  metallic taste in the mouth or changes in taste This list may not describe all possible side effects. Call your doctor for medical advice about side effects. You may report side effects to FDA at 1-800-FDA-1088. Where should I keep my medicine? This drug is given in a hospital or clinic and will not be stored at home. NOTE: This sheet is a summary. It may not cover all possible information. If you have questions about this medicine, talk to your doctor, pharmacist, or health care provider.  2021 Elsevier/Gold Standard (2007-12-08 14:38:05)

## 2021-01-30 ENCOUNTER — Encounter: Payer: Self-pay | Admitting: Hematology

## 2021-01-30 ENCOUNTER — Ambulatory Visit (HOSPITAL_COMMUNITY)
Admission: RE | Admit: 2021-01-30 | Discharge: 2021-01-30 | Disposition: A | Discharge status: Home / Self Care | Payer: Medicare Other | Source: Ambulatory Visit | Attending: Hematology | Admitting: Hematology

## 2021-01-30 DIAGNOSIS — C3490 Malignant neoplasm of unspecified part of unspecified bronchus or lung: Secondary | ICD-10-CM | POA: Insufficient documentation

## 2021-01-30 DIAGNOSIS — G9389 Other specified disorders of brain: Secondary | ICD-10-CM | POA: Diagnosis not present

## 2021-01-30 MED ORDER — GADOBUTROL 1 MMOL/ML IV SOLN
5.0000 mL | Freq: Once | INTRAVENOUS | Status: AC | PRN
  Administered 2021-01-30: 5 mL via INTRAVENOUS

## 2021-02-02 ENCOUNTER — Telehealth: Payer: Self-pay | Admitting: Hematology

## 2021-02-02 ENCOUNTER — Ambulatory Visit (HOSPITAL_COMMUNITY): Payer: Medicare Other

## 2021-02-02 NOTE — Telephone Encounter (Signed)
Left message with follow-up appointments per 5/16 los. Gave option to call back to reschedule if needed.

## 2021-02-03 ENCOUNTER — Emergency Department (HOSPITAL_COMMUNITY)
Admission: EM | Admit: 2021-02-03 | Discharge: 2021-02-03 | Disposition: A | Discharge status: Home / Self Care | Payer: Medicare Other | Attending: Emergency Medicine | Admitting: Emergency Medicine

## 2021-02-03 ENCOUNTER — Other Ambulatory Visit: Payer: Self-pay

## 2021-02-03 ENCOUNTER — Emergency Department (HOSPITAL_COMMUNITY): Payer: Medicare Other

## 2021-02-03 ENCOUNTER — Encounter (HOSPITAL_COMMUNITY): Payer: Self-pay | Admitting: Emergency Medicine

## 2021-02-03 DIAGNOSIS — C3492 Malignant neoplasm of unspecified part of left bronchus or lung: Secondary | ICD-10-CM | POA: Diagnosis not present

## 2021-02-03 DIAGNOSIS — I499 Cardiac arrhythmia, unspecified: Secondary | ICD-10-CM | POA: Diagnosis not present

## 2021-02-03 DIAGNOSIS — I10 Essential (primary) hypertension: Secondary | ICD-10-CM | POA: Diagnosis not present

## 2021-02-03 DIAGNOSIS — J449 Chronic obstructive pulmonary disease, unspecified: Secondary | ICD-10-CM | POA: Insufficient documentation

## 2021-02-03 DIAGNOSIS — J90 Pleural effusion, not elsewhere classified: Secondary | ICD-10-CM | POA: Diagnosis not present

## 2021-02-03 DIAGNOSIS — Z87891 Personal history of nicotine dependence: Secondary | ICD-10-CM | POA: Insufficient documentation

## 2021-02-03 DIAGNOSIS — Z853 Personal history of malignant neoplasm of breast: Secondary | ICD-10-CM | POA: Insufficient documentation

## 2021-02-03 DIAGNOSIS — Z79899 Other long term (current) drug therapy: Secondary | ICD-10-CM | POA: Diagnosis not present

## 2021-02-03 DIAGNOSIS — I471 Supraventricular tachycardia: Secondary | ICD-10-CM | POA: Diagnosis not present

## 2021-02-03 DIAGNOSIS — C3490 Malignant neoplasm of unspecified part of unspecified bronchus or lung: Secondary | ICD-10-CM

## 2021-02-03 DIAGNOSIS — E860 Dehydration: Secondary | ICD-10-CM | POA: Insufficient documentation

## 2021-02-03 DIAGNOSIS — R0902 Hypoxemia: Secondary | ICD-10-CM | POA: Diagnosis not present

## 2021-02-03 DIAGNOSIS — R0602 Shortness of breath: Secondary | ICD-10-CM | POA: Diagnosis not present

## 2021-02-03 DIAGNOSIS — R404 Transient alteration of awareness: Secondary | ICD-10-CM | POA: Diagnosis not present

## 2021-02-03 DIAGNOSIS — R918 Other nonspecific abnormal finding of lung field: Secondary | ICD-10-CM | POA: Diagnosis not present

## 2021-02-03 DIAGNOSIS — R000 Tachycardia, unspecified: Secondary | ICD-10-CM | POA: Diagnosis not present

## 2021-02-03 DIAGNOSIS — Z743 Need for continuous supervision: Secondary | ICD-10-CM | POA: Diagnosis not present

## 2021-02-03 DIAGNOSIS — R42 Dizziness and giddiness: Secondary | ICD-10-CM | POA: Diagnosis not present

## 2021-02-03 DIAGNOSIS — R531 Weakness: Secondary | ICD-10-CM | POA: Diagnosis not present

## 2021-02-03 LAB — COMPREHENSIVE METABOLIC PANEL
ALT: 13 U/L (ref 0–44)
AST: 17 U/L (ref 15–41)
Albumin: 2.7 g/dL — ABNORMAL LOW (ref 3.5–5.0)
Alkaline Phosphatase: 108 U/L (ref 38–126)
Anion gap: 12 (ref 5–15)
BUN: 13 mg/dL (ref 8–23)
CO2: 23 mmol/L (ref 22–32)
Calcium: 8.7 mg/dL — ABNORMAL LOW (ref 8.9–10.3)
Chloride: 98 mmol/L (ref 98–111)
Creatinine, Ser: 0.51 mg/dL (ref 0.44–1.00)
GFR, Estimated: >60 mL/min (ref >60)
Glucose, Bld: 91 mg/dL (ref 70–99)
Potassium: 3.4 mmol/L — ABNORMAL LOW (ref 3.5–5.1)
Sodium: 133 mmol/L — ABNORMAL LOW (ref 135–145)
Total Bilirubin: 0.5 mg/dL (ref 0.3–1.2)
Total Protein: 5.7 g/dL — ABNORMAL LOW (ref 6.5–8.1)

## 2021-02-03 LAB — URINALYSIS, ROUTINE W REFLEX MICROSCOPIC
Bacteria, UA: NONE SEEN
Bilirubin Urine: NEGATIVE
Glucose, UA: NEGATIVE mg/dL
Hgb urine dipstick: NEGATIVE
Ketones, ur: 5 mg/dL — AB
Nitrite: NEGATIVE
Protein, ur: NEGATIVE mg/dL
Specific Gravity, Urine: 1.013 (ref 1.005–1.030)
pH: 6 (ref 5.0–8.0)

## 2021-02-03 LAB — CBC
HCT: 38.5 % (ref 36.0–46.0)
Hemoglobin: 13 g/dL (ref 12.0–15.0)
MCH: 30 pg (ref 26.0–34.0)
MCHC: 33.8 g/dL (ref 30.0–36.0)
MCV: 88.7 fL (ref 80.0–100.0)
Platelets: 369 10*3/uL (ref 150–400)
RBC: 4.34 MIL/uL (ref 3.87–5.11)
RDW: 14.6 % (ref 11.5–15.5)
WBC: 11 10*3/uL — ABNORMAL HIGH (ref 4.0–10.5)
nRBC: 0 % (ref 0.0–0.2)

## 2021-02-03 LAB — TROPONIN I (HIGH SENSITIVITY)
Troponin I (High Sensitivity): 21 ng/L — ABNORMAL HIGH (ref <18)
Troponin I (High Sensitivity): 37 ng/L — ABNORMAL HIGH (ref <18)

## 2021-02-03 MED ORDER — SODIUM CHLORIDE 0.9 % IV BOLUS
1000.0000 mL | Freq: Once | INTRAVENOUS | Status: AC
  Administered 2021-02-03: 1000 mL via INTRAVENOUS

## 2021-02-03 MED ORDER — FENTANYL CITRATE (PF) 100 MCG/2ML IJ SOLN
50.0000 ug | Freq: Once | INTRAMUSCULAR | Status: AC
  Administered 2021-02-03: 50 ug via INTRAVENOUS
  Filled 2021-02-03: qty 2

## 2021-02-03 NOTE — ED Provider Notes (Signed)
[facility] EMERGENCY DEPARTMENT Provider Note   CSN: [id number] Arrival date & time: 02/03/21  1155     History Chief Complaint  Patient presents with  . Dizziness  . Shortness of Breath  . Weakness    Valerie Mosley is a 70 y.o. female.  Pt presents to the ED today with dizziness and weakness and sob.  She called EMS who found her to be in SVT.  HR 210.  EMS gave pt 6 mg adenosine IV which stopped the SVT and put her into sinus tach.  Pt also given 500 cc NS.  Pt said she feels much better now.  Pt has a hx of metastatic lung adenocarcinoma, stage IV.  She has had very little appetite.  Last chemo was on 5/16.          Past Medical History:  Diagnosis Date  . Breast cancer ([place])   . Cancer ([place])    Stage IA non-small cell lung cancer, left upper lobectomy 12/2011  . COPD (chronic obstructive pulmonary disease) ([place])   . Cough 07/29/11   started coughing up blood this am  . Cough   . Hypertension   . Kyphoscoliosis   . Pneumonia   . PONV (postoperative nausea and vomiting)   . Recurrent upper respiratory infection (URI)     Patient Active Problem List   Diagnosis Date Noted  . Metastatic lung cancer (metastasis from lung to other site), left ([place]) 01/07/2021  . Non-small cell lung cancer (NSCLC) ([place]) 12/28/2020  . Primary cancer of left upper lobe of lung ([place]) 12/14/2020  . Malignant neoplasm of upper-inner quadrant of left breast in female, estrogen receptor negative ([place]) 11/08/2020  . Fever blister 10/30/2017  . Allergic rhinitis 03/04/2016  . Non-small cell lung cancer ([place]) 01/08/2012  . COPD (chronic obstructive pulmonary disease) ([place]) 07/29/2011  . Tobacco abuse 07/29/2011  . Kyphoscoliosis 07/29/2011    Past Surgical History:  Procedure Laterality Date  . BREAST LUMPECTOMY WITH RADIOACTIVE SEED AND SENTINEL LYMPH NODE BIOPSY Left 11/21/2020   Procedure: LEFT BREAST LUMPECTOMY WITH RADIOACTIVE SEED AND LEFT SENTINEL LYMPH NODE  [place];  Surgeon: Erroll Luna, MD;  Location: [facility];  Service: General;  Laterality: Left;  PEC BLOCK; START TIME OF 11:00 AM FOR 90 MINUTES IN ROOM 2  . BRONCHOSCOPY    . CATARACT EXTRACTION, BILATERAL  2016  . EYE SURGERY    . INSERTION OF IBV VALVE Left 04/16/2018   Procedure: INSERTION OF INTERBRONCHIAL VALVE (IBV);  Surgeon: [name], MD;  Location: [facility];  Service: Thoracic;  Laterality: Left;  . INSERTION OF IBV VALVE N/A 06/25/2018   Procedure: REMOVAL OF THREE INTERBRONCHIAL VALVE (IBV);  Surgeon: [name], MD;  Location: [facility];  Service: Thoracic;  Laterality: N/A;  . LOBECTOMY  12/17/2011   Procedure: LOBECTOMY;  Surgeon: [name], MD;  Location: [facility];  Service: Thoracic;  Laterality: Left;  (L)VATS, WEDGE RESECTION, LEFT UPPER LOBECTOMY,  Multiple node biopsies  . PORTACATH PLACEMENT Left 11/21/2020   Procedure: INSERTION PORT-A-CATH;  Surgeon: Erroll Luna, MD;  Location: [facility];  Service: General;  Laterality: Left;  . REMOVAL OF PLEURAL DRAINAGE CATHETER Left 05/06/2018   Procedure: REMOVAL OF CHEST TUBE;  Surgeon: [name], MD;  Location: [facility];  Service: Thoracic;  Laterality: Left;  . THYROID SURGERY  1980   1/2 thyroid removed - benign  . URETHRA SURGERY    . VIDEO ASSISTED THORACOSCOPY Left 04/08/2018  Procedure: REDO LEFT VIDEO ASSISTED THORACOSCOPY with left lower lobe wedge resection;  Surgeon: [name], MD;  Location: [facility];  Service: Thoracic;  Laterality: Left;  [facility] VIDEO BRONCHOSCOPY N/A 04/16/2018   Procedure: VIDEO BRONCHOSCOPY;  Surgeon: [name], MD;  Location: [facility];  Service: Thoracic;  Laterality: N/A;  . VIDEO BRONCHOSCOPY N/A 06/25/2018   Procedure: VIDEO BRONCHOSCOPY;  Surgeon: [name], MD;  Location: [facility];  Service: Thoracic;  Laterality: N/A;     OB History   No obstetric history on file.     Family History  Problem Relation Age of Onset  . Cancer Father 24        metastatic cancer   . Cancer Brother 60       brain tumor   . Cancer Cousin        colon cancer  . Cancer Cousin        lung cancer    Social History   Tobacco Use  . Smoking status: Former Smoker    Packs/day: 0.50    Years: 40.00    Pack years: 20.00    Types: Cigarettes    Quit date: 12/16/2011    Years since quitting: 9.1  . Smokeless tobacco: Never Used  Vaping Use  . Vaping Use: Never used  Substance Use Topics  . Alcohol use: Yes    Alcohol/week: 14.0 standard drinks    Types: 7 Glasses of wine, 7 Cans of beer per week    Comment: 1 per day   . Drug use: No    Home Medications Prior to Admission medications   Medication Sig Start Date End Date Taking? Authorizing Provider  acetaminophen (TYLENOL) 500 MG tablet Take 1,000 mg by mouth every 6 (six) hours as needed for moderate pain or headache.   Yes [provider]  BEVESPI AEROSPHERE 9-4.8 MCG/ACT AERO INHALE 2 PUFFS INTO THE LUNGS 2 TIMES DAILY. Patient taking differently: Inhale 2 puffs into the lungs as needed (wheezing, shortness of breath). 01/03/20  Yes Collene Gobble, MD  dexamethasone (DECADRON) 4 MG tablet Take 2 tablets (8 mg total) by mouth daily. Start the day after carboplatin chemotherapy for 3 days. Patient taking differently: Take 4 mg by mouth See admin instructions. 4 mg 2 days after chemo. 01/08/21  Yes Truitt Merle, MD  lidocaine-prilocaine (EMLA) cream Apply to affected area once Patient taking differently: Apply 1 application topically See admin instructions. Apply to affected area or port before/after treatment. 01/08/21  Yes Truitt Merle, MD  mirtazapine (REMERON) 7.5 MG tablet Take 1 tablet (7.5 mg total) by mouth at bedtime. 01/08/21  Yes Truitt Merle, MD  morphine (MS CONTIN) 15 MG 12 hr tablet Take 15 mg by mouth every 12 (twelve) hours.   Yes [provider]  ondansetron (ZOFRAN) 8 MG tablet Take 1 tablet (8 mg total) by mouth 2 (two) times daily as needed for refractory nausea  / vomiting. Start on day 3 after carboplatin chemo. 01/08/21  Yes Truitt Merle, MD  oxyCODONE (OXY IR/ROXICODONE) 5 MG immediate release tablet Take 1 tablet (5 mg total) by mouth every 4 (four) hours as needed for severe pain. 01/26/21  Yes Truitt Merle, MD  prochlorperazine (COMPAZINE) 10 MG tablet Take 1 tablet (10 mg total) by mouth every 6 (six) hours as needed (Nausea or vomiting). 01/08/21  Yes Truitt Merle, MD  acyclovir ointment (ZOVIRAX) 5 % Apply 1 application topically every 3 (three) hours as needed. Patient not taking: No  sig reported 08/25/20   Collene Gobble, MD  albuterol (VENTOLIN HFA) 108 (90 Base) MCG/ACT inhaler Inhale 2 puffs into the lungs every 4 (four) hours as needed for wheezing or shortness of breath. For shortness of breath Patient not taking: Reported on 02/03/2021 11/20/15   Collene Gobble, MD  montelukast (SINGULAIR) 10 MG tablet Take 1 tablet (10 mg total) by mouth at bedtime. Patient not taking: Reported on 02/03/2021 09/10/17   Collene Gobble, MD  potassium chloride (KLOR-CON) 10 MEQ tablet Take 1 tablet (10 mEq total) by mouth daily. Patient not taking: No sig reported 01/15/21   Truitt Merle, MD  traMADol (ULTRAM) 50 MG tablet Take 1 tablet (50 mg total) by mouth every 6 (six) hours as needed. Patient not taking: No sig reported 12/28/20   Truitt Merle, MD    Allergies    Patient has no known allergies.  Review of Systems   Review of Systems  Respiratory: Positive for shortness of breath.   Neurological: Positive for dizziness and weakness.  All other systems reviewed and are negative.   Physical Exam Updated Vital Signs BP 121/88   Pulse 92   Temp 98 F (36.7 C) (Oral)   Resp (!) 25   Ht 5\' 4"  (1.626 m)   Wt 45.4 kg   SpO2 97%   BMI 17.16 kg/m   Physical Exam Vitals and nursing note reviewed.  Constitutional:      Appearance: She is underweight.  HENT:     Head: Normocephalic and atraumatic.     Mouth/Throat:     Mouth: Mucous membranes are dry.  Eyes:      Extraocular Movements: Extraocular movements intact.     Pupils: Pupils are equal, round, and reactive to light.  Cardiovascular:     Rate and Rhythm: Regular rhythm. Tachycardia present.  Pulmonary:     Effort: Pulmonary effort is normal.     Breath sounds: Normal breath sounds.  Abdominal:     General: Bowel sounds are normal.     Palpations: Abdomen is soft.  Musculoskeletal:        General: Normal range of motion.     Cervical back: Normal range of motion and neck supple.  Skin:    General: Skin is warm.     Capillary Refill: Capillary refill takes less than 2 seconds.  Neurological:     General: No focal deficit present.     Mental Status: She is alert and oriented to person, place, and time.  Psychiatric:        Mood and Affect: Mood normal.        Behavior: Behavior normal.     ED Results / Procedures / Treatments   Labs (all labs ordered are listed, but only abnormal results are displayed) Labs Reviewed  CBC - Abnormal; Notable for the following components:      Result Value   WBC 11.0 (*)    All other components within normal limits  COMPREHENSIVE METABOLIC PANEL - Abnormal; Notable for the following components:   Sodium 133 (*)    Potassium 3.4 (*)    Calcium 8.7 (*)    Total Protein 5.7 (*)    Albumin 2.7 (*)    All other components within normal limits  URINALYSIS, ROUTINE W REFLEX MICROSCOPIC - Abnormal; Notable for the following components:   Ketones, ur 5 (*)    Leukocytes,Ua TRACE (*)    All other components within normal limits  TROPONIN I (HIGH SENSITIVITY) -  Abnormal; Notable for the following components:   Troponin I (High Sensitivity) 21 (*)    All other components within normal limits  TROPONIN I (HIGH SENSITIVITY)    EKG None  EMS EKG:       Radiology DG Chest Port 1 View  Result Date: 02/03/2021 CLINICAL DATA:  Supraventricular tachycardia. Dizziness shortness of breath. Weakness. EXAM: PORTABLE CHEST 1 VIEW COMPARISON:  Chest  x-ray 11/29/2020. FINDINGS: Heart size is stable. Atherosclerotic changes are noted at the aorta. Right IJ Port-A-Cath stable position. Left upper lobe mass lesion has increased in size. Left pleural effusion noted. Left seventh rib fracture again noted. Changes of COPD chronic interstitial changes noted bilaterally. IMPRESSION: 1. Enlarging left upper lobe mass lesion. 2. Left pleural effusion. 3. Stable left seventh rib fracture. Electronically Signed   By: San Morelle M.D.   On: 02/03/2021 12:39    Procedures Procedures   Medications Ordered in ED Medications  sodium chloride 0.9 % bolus 1,000 mL (0 mLs Intravenous Stopped 02/03/21 1459)  fentaNYL (SUBLIMAZE) injection 50 mcg (50 mcg Intravenous Given 02/03/21 1457)    ED Course  I have reviewed the triage vital signs and the nursing notes.  Pertinent labs & imaging results that were available during my care of the patient were reviewed by me and considered in my medical decision making (see chart for details).    MDM Rules/Calculators/A&P                          Pt's SVT was stopped by EMS with 6 mg of adenosine.  She was initially in [place].  However, after fluids, HR is in the 80s.  The pt was d/w Dr. Rayann Heman (cards) who recommends no meds after 1 episode.  He asked that we teach her vagal maneuvers to do if sx start again.  I did teach her some moves.  He will have his office call her for an appt for f/u.    Pt has chronic pain from her mets.  She is given IV fentanyl.  She feels ready to go home.  She is to return if worse.     Final Clinical Impression(s) / ED Diagnoses Final diagnoses:  SVT (supraventricular tachycardia) ([place])  Dehydration  Primary malignant neoplasm of lung metastatic to other site, unspecified laterality [facility])    Rx / DC Orders ED Discharge Orders    None       Isla Pence, MD 02/03/21 1506

## 2021-02-03 NOTE — ED Triage Notes (Signed)
Pt BIB [facility] for SOB, Dizziness, weakness and was found to be in SVT at a rate of 210.  EMS gave 6 mg of Adenosin and a 525ml bolus.  Pt converted to NS at a rate of 110.  Pt has recent hx of breast cancer and is currently receiving chemo for lung cancer.  PT last chemo tx was 01/29/21.   Pt has not had much appetite and has not eaten or drunk much recently.

## 2021-02-05 ENCOUNTER — Inpatient Hospital Stay (HOSPITAL_BASED_OUTPATIENT_CLINIC_OR_DEPARTMENT_OTHER): Payer: Medicare Other | Admitting: Nurse Practitioner

## 2021-02-05 ENCOUNTER — Inpatient Hospital Stay: Payer: Medicare Other

## 2021-02-05 ENCOUNTER — Encounter: Payer: Self-pay | Admitting: Nurse Practitioner

## 2021-02-05 ENCOUNTER — Other Ambulatory Visit: Payer: Self-pay

## 2021-02-05 ENCOUNTER — Other Ambulatory Visit: Payer: Self-pay | Admitting: *Deleted

## 2021-02-05 VITALS — BP 116/81 | HR 100 | Temp 97.5°F | Resp 18 | Ht 64.0 in | Wt 100.9 lb

## 2021-02-05 DIAGNOSIS — C3492 Malignant neoplasm of unspecified part of left bronchus or lung: Secondary | ICD-10-CM

## 2021-02-05 DIAGNOSIS — C3432 Malignant neoplasm of lower lobe, left bronchus or lung: Secondary | ICD-10-CM | POA: Diagnosis not present

## 2021-02-05 DIAGNOSIS — I471 Supraventricular tachycardia: Secondary | ICD-10-CM | POA: Diagnosis not present

## 2021-02-05 DIAGNOSIS — Z5111 Encounter for antineoplastic chemotherapy: Secondary | ICD-10-CM | POA: Diagnosis not present

## 2021-02-05 DIAGNOSIS — C7989 Secondary malignant neoplasm of other specified sites: Secondary | ICD-10-CM | POA: Diagnosis not present

## 2021-02-05 DIAGNOSIS — C770 Secondary and unspecified malignant neoplasm of lymph nodes of head, face and neck: Secondary | ICD-10-CM | POA: Diagnosis not present

## 2021-02-05 DIAGNOSIS — C7951 Secondary malignant neoplasm of bone: Secondary | ICD-10-CM | POA: Diagnosis not present

## 2021-02-05 DIAGNOSIS — Z95828 Presence of other vascular implants and grafts: Secondary | ICD-10-CM

## 2021-02-05 DIAGNOSIS — Z5112 Encounter for antineoplastic immunotherapy: Secondary | ICD-10-CM | POA: Diagnosis not present

## 2021-02-05 LAB — CBC WITH DIFFERENTIAL (CANCER CENTER ONLY)
Abs Immature Granulocytes: 0.05 10*3/uL (ref 0.00–0.07)
Basophils Absolute: 0.1 10*3/uL (ref 0.0–0.1)
Basophils Relative: 1 %
Eosinophils Absolute: 0.2 10*3/uL (ref 0.0–0.5)
Eosinophils Relative: 2 %
HCT: 37.5 % (ref 36.0–46.0)
Hemoglobin: 12.7 g/dL (ref 12.0–15.0)
Immature Granulocytes: 1 %
Lymphocytes Relative: 11 %
Lymphs Abs: 1.1 10*3/uL (ref 0.7–4.0)
MCH: 29.5 pg (ref 26.0–34.0)
MCHC: 33.9 g/dL (ref 30.0–36.0)
MCV: 87 fL (ref 80.0–100.0)
Monocytes Absolute: 0.6 10*3/uL (ref 0.1–1.0)
Monocytes Relative: 6 %
Neutro Abs: 7.9 10*3/uL — ABNORMAL HIGH (ref 1.7–7.7)
Neutrophils Relative %: 79 %
Platelet Count: 340 10*3/uL (ref 150–400)
RBC: 4.31 MIL/uL (ref 3.87–5.11)
RDW: 14.6 % (ref 11.5–15.5)
WBC Count: 9.8 10*3/uL (ref 4.0–10.5)
nRBC: 0 % (ref 0.0–0.2)

## 2021-02-05 LAB — CMP (CANCER CENTER ONLY)
ALT: 12 U/L (ref 0–44)
AST: 24 U/L (ref 15–41)
Albumin: 3 g/dL — ABNORMAL LOW (ref 3.5–5.0)
Alkaline Phosphatase: 124 U/L (ref 38–126)
Anion gap: 14 (ref 5–15)
BUN: 9 mg/dL (ref 8–23)
CO2: 24 mmol/L (ref 22–32)
Calcium: 8.6 mg/dL — ABNORMAL LOW (ref 8.9–10.3)
Chloride: 96 mmol/L — ABNORMAL LOW (ref 98–111)
Creatinine: 0.5 mg/dL (ref 0.44–1.00)
GFR, Estimated: >60 mL/min (ref >60)
Glucose, Bld: 86 mg/dL (ref 70–99)
Potassium: 3.5 mmol/L (ref 3.5–5.1)
Sodium: 134 mmol/L — ABNORMAL LOW (ref 135–145)
Total Bilirubin: 0.6 mg/dL (ref 0.3–1.2)
Total Protein: 6 g/dL — ABNORMAL LOW (ref 6.5–8.1)

## 2021-02-05 MED ORDER — SODIUM CHLORIDE 0.9% FLUSH
10.0000 mL | INTRAVENOUS | Status: DC | PRN
  Administered 2021-02-05: 10 mL via INTRAVENOUS
  Filled 2021-02-05: qty 10

## 2021-02-05 MED ORDER — HEPARIN SOD (PORK) LOCK FLUSH 100 UNIT/ML IV SOLN
500.0000 [IU] | Freq: Once | INTRAVENOUS | Status: AC
  Administered 2021-02-05: 500 [IU] via INTRAVENOUS
  Filled 2021-02-05: qty 5

## 2021-02-05 NOTE — Progress Notes (Signed)
[place]   [phone] [phone]   Clinic Follow up Note   Patient Care Team: [name], MD as PCP - General (Family Medicine) Collene Gobble, MD (Pulmonary Disease) Erroll Luna, MD as Consulting Physician (General Surgery) Truitt Merle, MD as Consulting Physician (Hematology) 02/05/2021  CHIEF COMPLAINT: F/up metastatic lung cancer   SUMMARY OF ONCOLOGIC HISTORY: Oncology History Overview Note  Cancer Staging Malignant neoplasm of upper-inner quadrant of left breast in female, estrogen receptor negative ([place]) Staging form: Breast, AJCC 8th Edition - Clinical stage from 11/01/2020: Stage IB (cT1c, cN0, cM0, G3, ER-, PR-, HER2-) - Signed by Truitt Merle, MD on 11/08/2020 Stage prefix: Initial diagnosis - Pathologic stage from 11/21/2020: Stage IB (pT1c, pN0, cM0, G3, ER-, PR-, HER2-) - Signed by [name], NP on 12/20/2020 Stage prefix: Initial diagnosis Histologic grading system: 3 grade system  Non-small cell lung cancer ([place]) Staging form: Lung, AJCC 7th Edition - Clinical: Stage IA (Free text: Ia) - Signed by [name], MD on 12/28/2013 Laterality: Left Cancer stage: Ia - Pathologic: Ia - Signed by [name], MD on 12/28/2013 Laterality: Left Cancer stage: Ia    Non-small cell lung cancer ([place])  09/02/2011 Imaging   CT Chest  IMPRESSION:   1.  Interval clearing of right upper lobe pneumonia.  2.  Left upper lobe nodule is unchanged in the short interval from  07/29/2011.  Follow-up could be performed in 3 months to ensure  continued stability. This recommendation follows the consensus  statement: Guidelines for Management of Small Pulmonary Nodules  Detected on CT Scans:  A Statement from the [employer] as  published in Radiology 2005; 237:395-400.  Available online at:  [email or web address]/.  3.  Additional scattered pulmonary nodules can be  reevaluated on  future imaging as well.    11/06/2011 Imaging   PET IMPRESSION:   1.  Mild hypermetabolism which projects minimally cephalad to, but  is felt to correspond to the left upper lobe lung nodule.  Although  not within malignant range, given the small size of the nodule,  malignancy cannot be excluded.  Possible mild interval enlargement  of the nodule since 09/02/2011.  Consider repeat standard chest CT  to confirm interval enlargement.  Especially if interval  enlargement has occurred, tissue sampling would be suggested.  Alternatively, 67-monthfollow-up chest CT could be performed.  2.  No evidence of thoracic nodal or extrathoracic disease.  3. Vague hypermetabolism corresponding to a prominent right lobe of  the thyroid. Nonspecific.  Consider ultrasound correlation.IMPRESSION:   1.  Mild hypermetabolism which projects minimally cephalad to, but  is felt to correspond to the left upper lobe lung nodule.  Although  not within malignant range, given the small size of the nodule,  malignancy cannot be excluded.  Possible mild interval enlargement  of the nodule since 09/02/2011.  Consider repeat standard chest CT  to confirm interval enlargement.  Especially if interval  enlargement has occurred, tissue sampling would be suggested.  Alternatively, 339-monthollow-up chest CT could be performed.  2.  No evidence of thoracic nodal or extrathoracic disease.  3. Vague hypermetabolism corresponding to a prominent right lobe of  the thyroid. Nonspecific.  Consider ultrasound correlation.   12/17/2011 Surgery   Thoracoscopy by Dr [name] Diagnosis 1. Lung, wedge biopsy/resection, left upper lobe - INVASIVE WELL-DIFFERENTIATED ADENOCARCINOMA, SPANNING 1.2 CM. - NO LYMPH VASCULAR INVASION IDENTIFIED. - PLEURA IS UNINVOLVED. - MARGINS ARE NEGATIVE. - SEE  ONCOLOGY TEMPLATE. 2. Lymph node, biopsy, level 9 - ONE BENIGN LYMPH NODE WITH NO TUMOR SEEN (0/1). 3. Lymph node,  biopsy, level 9 - ONE BENIGN LYMPH NODE WITH NO TUMOR SEEN (0/1). 4. Lymph node, biopsy, 11 - ONE BENIGN LYMPH NODE WITH NO TUMOR SEEN (0/1). 5. Lymph node, biopsy, 11 #2 - ONE BENIGN LYMPH NODE WITH NO TUMOR SEEN (0/1). 6. Lymph node, biopsy, 5 - ONE BENIGN LYMPH NODE WITH NO TUMOR SEEN (0/1). 7. Lymph node, biopsy, 10 - ONE BENIGN LYMPH NODE WITH NO TUMOR SEEN (0/1). 8. Lymph node, biopsy, 11 #3 - ONE BENIGN LYMPH NODE WITH NO TUMOR SEEN (0/1). 9. Lung, resection (segmental or lobe), Remainder of left upper lobe - BENIGN LUNG PARENCHYMA WITH CHRONIC INFLAMMATION AND INCREASED PULMONARY MACROPHAGES. - THREE BENIGN LYMPH NODES IDENTIFIED (0/3). - NO TUMOR SEEN.    01/08/2012 Initial Diagnosis   Non-small cell lung cancer ([place])   02/10/2018 Imaging   CT Chest IMPRESSION: 1. Enlarging irregular pleural-based solid 1.9 cm pulmonary nodule in the superior segment left lower lobe, suspicious for malignancy, either recurrent disease or a metachronous primary bronchogenic carcinoma. Consider PET-CT for further characterization. 2. Additional scattered bilateral pulmonary nodules are stable and considered benign. 3. No noncontrast CT evidence of thoracic adenopathy. 4. Three-vessel coronary atherosclerosis. 5. Nonobstructing left nephrolithiasis.   Aortic Atherosclerosis (ICD10-I70.0) and Emphysema (ICD10-J43.9).   02/18/2018 PET scan   IMPRESSION: 1. Hypermetabolic apical nodule in the superior segment left lower lobe is highly worrisome for bronchogenic carcinoma. 2. No additional areas of abnormal hypermetabolism in the neck, chest, abdomen or pelvis. 3. Aortic atherosclerosis (ICD10-170.0). Coronary artery calcification. 4.  Emphysema (ICD10-J43.9). 5. Left renal stone.     03/06/2018 Pathology Results   Lung Biopsy  Diagnosis Lung, needle/core biopsy(ies), Left Lower Lobe - ADENOCARCINOMA. Microscopic Comment There is likely sufficient tissue for additional studies if  requested (block 1B). Dr. Lyndon Code has reviewed the case.   04/08/2018 Surgery   Left Video assisted Thoracoscopy by Dr Roxan Hockey  Diagnosis Lung, wedge biopsy/resection, Left Lower Lobe - INVASIVE ADENOCARCINOMA, MODERATELY DIFFERENTIATED, SPANNING 1.3 CM. - ADENOCARCINOMA INVOLVES VISCERAL PLEURA. - ONE BENIGN INTRAPULMONARY LYMPH NODE (0/1). - LYMPHOVASCULAR INVASION IS IDENTIFIED, FOCAL. - THE SURGICAL RESECTION MARGINS ARE NEGATIVE FOR CARCINOMA. - SEE ONCOLOGY TABLE BELOW.    08/01/2020 Imaging   CT Chest  IMPRESSION: Stable postop changes from prior left upper lobectomy. Stable small bilateral pulmonary nodules. No new or progressive disease within the thorax.   Aortic Atherosclerosis (ICD10-I70.0) and Emphysema (ICD10-J43.9).   Malignant neoplasm of upper-inner quadrant of left breast in female, estrogen receptor negative ([place])  10/18/2020 Mammogram   M[facility]ogram 10/18/20 at [facility] FINDINGS:  Cc and MLO views of bilateral breasts are submitted. There is a  spiculated mass in the palpable area upper left breast. The right  breast is negative.   Targeted ultrasound is performed, showing 1.2 x 0.9 x 1.2 cm  spiculated hypoechoic mass at the left breast 11 o'clock 8 cm from  nipple palpable area. This correlates to the mammographic mass.  Ultrasound of the left axilla is negative.   IMPRESSION:  Highly suspicious findings.     10/26/2020 Initial Biopsy   Final Pathologic Diagnosis  10/26/20 at [facility] BREAST, LEFT 1:00 8 CM FROM NIPPLE, NEEDLE BIOPSY:              INVASIVE DUCTAL CARCINOMA, NOTTINGHAM GRADE 3. Comment   The Nottingham grade is 3 (3-tubule formation, 3-nuclear atypia, 3-mitoses).  This case was reviewed  by Dr. Claudia Croitoru and she is in essential agreement with the above diagnosis.  Final Pathologic Diagnosis   Prognostic Markers in Cancer   Block Number:  [id number] Diagnosis:  Invasive ductal carcinoma     Results:   Estrogen Receptor:  Negative  (<1%)   Progesterone Receptor:  Negative (<1%)   Her2:  Negative (score=1+)     Ki-67:  Percentage of tumor cells with nuclear positivity: 60 %      11/01/2020 Cancer Staging   Staging form: Breast, AJCC 8th Edition - Clinical stage from 11/01/2020: Stage IB (cT1c, cN0, cM0, G3, ER-, PR-, HER2-) - Signed by Feng, Yan, MD on 11/08/2020 Stage prefix: Initial diagnosis   11/08/2020 Initial Diagnosis   Malignant neoplasm of upper-inner quadrant of left breast in female, estrogen receptor negative (HCC)   11/21/2020 Surgery   LEFT BREAST LUMPECTOMY WITH RADIOACTIVE SEED AND LEFT SENTINEL LYMPH NODE MAPPIMG and PAC placement by Dr Cornett    11/21/2020 Pathology Results   FINAL MICROSCOPIC DIAGNOSIS:   A. BREAST, LEFT, LUMPECTOMY:  - Invasive ductal carcinoma, grade 3, 1.3 cm  - The anterior resection margin is widely involved by carcinoma  - Carcinoma is 0.2 cm from posterior margin  - Biopsy site changes  - See oncology table   B. BREAST, LEFT ADDITIONAL SUPERIOR MARGIN, EXCISION:  - Unremarkable fibroadipose tissue, negative for carcinoma   C. BREAST, LEFT ADDITIONAL LATERAL MARGIN, EXCISION:  - Unremarkable fibroadipose tissue, negative for carcinoma   D. BREAST, LEFT ADDITIONAL INFEROMEDIAL MARGIN, EXCISION:  - Unremarkable fibroadipose tissue, negative for carcinoma   E. LYMPH NODE, LEFT AXILLARY, SENTINEL, EXCISION:  - Lymph node, negative for carcinoma (0/1)    11/21/2020 Cancer Staging   Staging form: Breast, AJCC 8th Edition - Pathologic stage from 11/21/2020: Stage IB (pT1c, pN0, cM0, G3, ER-, PR-, HER2-) - Signed by Causey, Lindsey Cornetto, NP on 12/20/2020 Stage prefix: Initial diagnosis Histologic grading system: 3 grade system   Metastatic lung cancer (metastasis from lung to other site), left (HCC)  12/11/2020 Imaging   CT Chest  IMPRESSION: 1. Signs of tumor recurrence within the apical portion of the left hemithorax with large rind of increased soft tissue. Tumor  extends into the left upper thoracic spine paravertebral soft tissues. 2. Pleural spread of disease is noted with mild progressive pleural thickening overlying the posterior and lateral left lower lung. 3. Interval development of increased interstitial markings within the left upper lobe adjacent to mass. In the absence of interval external beam radiation (since 08/01/2020) this is suspicious for lymphangitic spread of tumor. 4. Progressive soft tissue mass the within the paravertebral right upper lobe concerning for metastatic disease. 5. New enlarged anterior mediastinal and left supraclavicular lymph nodes compatible with metastatic adenopathy. 6. Indeterminate soft tissue attenuating nodular density between the upper pole of left kidney and left adrenal gland. Cannot exclude nodal metastasis or adrenal gland metastasis. 7. Aortic atherosclerosis.   Aortic Atherosclerosis (ICD10-I70.0).   12/22/2020 Pathology Results   A. LYMPH NODE, LEFT NECK, NEEDLE CORE BIOPSY:  - Metastatic adenocarcinoma.   COMMENT:   CK7 is positive.  CK20, TTF-1, Napsin-A, CDX-2, PAX 8, GATA-3 and ER are  negative.  The limited CK7 positive staining is compatible with origin  from lung; however, it does not exclude origin from other entities. If  applicable, there is likely sufficient tissue for ancillary studies.  Dr. Patrick reviewed the case.    12/27/2020 Imaging   PET scan IMPRESSION: 1. Large hypermetabolic mass occupying the   left lung apex consistent with recurrent neoplasm. 2. Extensive metastatic disease involving the chest, abdomen, pelvis, bony structures, muscles and subcutaneous tissues.   01/07/2021 Initial Diagnosis   Metastatic lung cancer (metastasis from lung to other site), left (HCC)   01/08/2021 -  Chemotherapy   First-line Carboplatin and Taxol with Bevacuzimab q3weeks starting 01/08/21. Due to significant fatigue and weight loss, reduced chemo to 2 weeks on/1 week off treatment  starting with C2 on 01/29/21.      CURRENT THERAPY: First-line Carboplatin and Taxol with Bevacuzimab q3weeks starting 01/08/21. Due to significant fatigue and weight loss, reduced chemo to 2 weeks on/1 week off treatment starting with C2 on 01/29/21.   INTERVAL HISTORY: Ms. Dimmick returns for f/up as scheduled. She was last seen 01/29/21 and received cycle 2 day 1 taxol/carbo and bevacizumab. On 5/21 she felt dizzy, with "flying" heart rate, pre-syncopal and called EMS and found to be in SVT with HR 210, treated with adenosine. At [facility] she was found to be mildly dehydrated, given IVF and pain med. She felt better and was discharged home. No recurrent episodes. She does not have cardiology or new consult scheduled yet.   Today, she presents with her husband. She has low appetite and fatigue. Most days she gets up, spends time outside, and rests. She stopped remeron after a week of no improvement. She has not lost any weight this week. She has constant nausea, she believes started after taking MS contin for several days now stopped. No vomiting. Compazine is partially effective. Bowels moving now. Cough and exertional dyspnea are stable. Denies chest pain. Right scapular pain improved, "almost eased off completely." left mid back pain is stable. Takes 3-4 oxy per day. Headaches are intermittent. Denies neuropathy or other changes.     MEDICAL HISTORY:  Past Medical History:  Diagnosis Date  . Breast cancer (HCC)   . Cancer (HCC)    Stage IA non-small cell lung cancer, left upper lobectomy 12/2011  . COPD (chronic obstructive pulmonary disease) (HCC)   . Cough 07/29/11   started coughing up blood this am  . Cough   . Hypertension   . Kyphoscoliosis   . Pneumonia   . PONV (postoperative nausea and vomiting)   . Recurrent upper respiratory infection (URI)     SURGICAL HISTORY: Past Surgical History:  Procedure Laterality Date  . BREAST LUMPECTOMY WITH RADIOACTIVE SEED AND SENTINEL  LYMPH NODE BIOPSY Left 11/21/2020   Procedure: LEFT BREAST LUMPECTOMY WITH RADIOACTIVE SEED AND LEFT SENTINEL LYMPH NODE MAPPIMG;  Surgeon: Cornett, Thomas, MD;  Location: [facility] OR;  Service: General;  Laterality: Left;  PEC BLOCK; START TIME OF 11:00 AM FOR 90 MINUTES IN ROOM 2  . BRONCHOSCOPY    . CATARACT EXTRACTION, BILATERAL  2016  . EYE SURGERY    . INSERTION OF IBV VALVE Left 04/16/2018   Procedure: INSERTION OF INTERBRONCHIAL VALVE (IBV);  Surgeon: Hendrickson, Steven C, MD;  Location: [facility] OR;  Service: Thoracic;  Laterality: Left;  . INSERTION OF IBV VALVE N/A 06/25/2018   Procedure: REMOVAL OF THREE INTERBRONCHIAL VALVE (IBV);  Surgeon: Hendrickson, Steven C, MD;  Location: [facility] OR;  Service: Thoracic;  Laterality: N/A;  . LOBECTOMY  12/17/2011   Procedure: LOBECTOMY;  Surgeon: Steven C Hendrickson, MD;  Location: [facility] OR;  Service: Thoracic;  Laterality: Left;  (L)VATS, WEDGE RESECTION, LEFT UPPER LOBECTOMY,  Multiple node biopsies  . PORTACATH PLACEMENT Left 11/21/2020   Procedure: INSERTION PORT-A-CATH;  Surgeon: Cornett, Thomas, MD;    Location: [facility] OR;  Service: General;  Laterality: Left;  . REMOVAL OF PLEURAL DRAINAGE CATHETER Left 05/06/2018   Procedure: REMOVAL OF CHEST TUBE;  Surgeon: [name], MD;  Location: [facility];  Service: Thoracic;  Laterality: Left;  . THYROID SURGERY  1980   1/2 thyroid removed - benign  . URETHRA SURGERY    . VIDEO ASSISTED THORACOSCOPY Left 04/08/2018   Procedure: REDO LEFT VIDEO ASSISTED THORACOSCOPY with left lower lobe wedge resection;  Surgeon: [name], MD;  Location: [facility];  Service: Thoracic;  Laterality: Left;  Marland Kitchen VIDEO BRONCHOSCOPY N/A 04/16/2018   Procedure: VIDEO BRONCHOSCOPY;  Surgeon: [name], MD;  Location: [facility];  Service: Thoracic;  Laterality: N/A;  . VIDEO BRONCHOSCOPY N/A 06/25/2018   Procedure: VIDEO BRONCHOSCOPY;  Surgeon: [name], MD;  Location: [facility] OR;  Service: Thoracic;  Laterality: N/A;    I  have reviewed the social history and family history with the patient and they are unchanged from previous note.  ALLERGIES:  has No Known Allergies.  MEDICATIONS:  Current Outpatient Medications  Medication Sig Dispense Refill  . acetaminophen (TYLENOL) 500 MG tablet Take 1,000 mg by mouth every 6 (six) hours as needed for moderate pain or headache.    Marland Kitchen acyclovir ointment (ZOVIRAX) 5 % Apply 1 application topically every 3 (three) hours as needed. 15 g 0  . albuterol (VENTOLIN HFA) 108 (90 Base) MCG/ACT inhaler Inhale 2 puffs into the lungs every 4 (four) hours as needed for wheezing or shortness of breath. For shortness of breath 8.51 g 5  . BEVESPI AEROSPHERE 9-4.8 MCG/ACT AERO INHALE 2 PUFFS INTO THE LUNGS 2 TIMES DAILY. (Patient taking differently: Inhale 2 puffs into the lungs as needed (wheezing, shortness of breath).) 10.7 g 11  . dexamethasone (DECADRON) 4 MG tablet Take 2 tablets (8 mg total) by mouth daily. Start the day after carboplatin chemotherapy for 3 days. (Patient taking differently: Take 4 mg by mouth See admin instructions. 4 mg 2 days after chemo.) 30 tablet 1  . lidocaine-prilocaine (EMLA) cream Apply to affected area once (Patient taking differently: Apply 1 application topically See admin instructions. Apply to affected area or port before/after treatment.) 30 g 3  . mirtazapine (REMERON) 7.5 MG tablet Take 1 tablet (7.5 mg total) by mouth at bedtime. 30 tablet 0  . montelukast (SINGULAIR) 10 MG tablet Take 1 tablet (10 mg total) by mouth at bedtime. 30 tablet 5  . morphine (MS CONTIN) 15 MG 12 hr tablet Take 15 mg by mouth every 12 (twelve) hours.    . ondansetron (ZOFRAN) 8 MG tablet Take 1 tablet (8 mg total) by mouth 2 (two) times daily as needed for refractory nausea / vomiting. Start on day 3 after carboplatin chemo. 30 tablet 1  . oxyCODONE (OXY IR/ROXICODONE) 5 MG immediate release tablet Take 1 tablet (5 mg total) by mouth every 4 (four) hours as needed for severe  pain. 90 tablet 0  . potassium chloride (KLOR-CON) 10 MEQ tablet Take 1 tablet (10 mEq total) by mouth daily. 7 tablet 0  . prochlorperazine (COMPAZINE) 10 MG tablet Take 1 tablet (10 mg total) by mouth every 6 (six) hours as needed (Nausea or vomiting). 30 tablet 1  . traMADol (ULTRAM) 50 MG tablet Take 1 tablet (50 mg total) by mouth every 6 (six) hours as needed. 60 tablet 0   No current facility-administered medications for this visit.    PHYSICAL EXAMINATION: ECOG PERFORMANCE STATUS: 2 -  Symptomatic, <50% confined to bed  Vitals:   02/05/21 0904  BP: 116/81  Pulse: 100  Resp: 18  Temp: (!) 97.5 F (36.4 C)  SpO2: 97%   Filed Weights   02/05/21 0904  Weight: 100 lb 14.4 oz (45.8 kg)    GENERAL:alert, no distress and comfortable SKIN: no rash  EYES:  Palpable cervical nodes L>R and L parotid  LYMPH:  1 cm left supraclavicular LN, superficial, with mild erythema of skin LUNGS: clear but decreased throughout, normal breathing effort HEART: regular rate & rhythm, no lower extremity edema Musculoskeletal: no focal tenderness   NEURO: alert & oriented x 3 with fluent speech PAC without erythema Breast exam deferred   LABORATORY DATA:  I have reviewed the data as listed CBC Latest Ref Rng & Units 02/05/2021 02/03/2021 01/29/2021  WBC 4.0 - 10.5 K/uL 9.8 11.0(H) 16.3(H)  Hemoglobin 12.0 - 15.0 g/dL 12.7 13.0 13.4  Hematocrit 36.0 - 46.0 % 37.5 38.5 39.5  Platelets 150 - 400 K/uL 340 369 493(H)     CMP Latest Ref Rng & Units 02/05/2021 02/03/2021 01/29/2021  Glucose 70 - 99 mg/dL 86 91 89  BUN 8 - 23 mg/dL 9 13 17  Creatinine 0.44 - 1.00 mg/dL 0.50 0.51 0.66  Sodium 135 - 145 mmol/L 134(L) 133(L) 135  Potassium 3.5 - 5.1 mmol/L 3.5 3.4(L) 4.0  Chloride 98 - 111 mmol/L 96(L) 98 98  CO2 22 - 32 mmol/L 24 23 22  Calcium 8.9 - 10.3 mg/dL 8.6(L) 8.7(L) 9.4  Total Protein 6.5 - 8.1 g/dL 6.0(L) 5.7(L) 7.1  Total Bilirubin 0.3 - 1.2 mg/dL 0.6 0.5 0.4  Alkaline Phos 38 - 126 U/L  124 108 142(H)  AST 15 - 41 U/L 24 17 17  ALT 0 - 44 U/L 12 13 8      RADIOGRAPHIC STUDIES: I have personally reviewed the radiological images as listed and agreed with the findings in the report. DG Chest Port 1 View  Result Date: 02/03/2021 CLINICAL DATA:  Supraventricular tachycardia. Dizziness shortness of breath. Weakness. EXAM: PORTABLE CHEST 1 VIEW COMPARISON:  Chest x-ray 11/29/2020. FINDINGS: Heart size is stable. Atherosclerotic changes are noted at the aorta. Right IJ Port-A-Cath stable position. Left upper lobe mass lesion has increased in size. Left pleural effusion noted. Left seventh rib fracture again noted. Changes of COPD chronic interstitial changes noted bilaterally. IMPRESSION: 1. Enlarging left upper lobe mass lesion. 2. Left pleural effusion. 3. Stable left seventh rib fracture. Electronically Signed   By: Christopher  Mattern M.D.   On: 02/03/2021 12:39     ASSESSMENT & PLAN: Kenzlei E Berwanger is a 69 y.o. female with    1.Metastatic Lung Adenocarcinoma, stage IV  -Chest CT 12/11/20 showeda large soft tissue mass in the left apical portion of the lung, with direct invasion of the left upper thoracic spine,probable pleural metastasis  -Left supraclavicular lymph node biopsyon4/8/22 confirmed metastatic adenocarcinoma -PET scan 12/27/20 showed: large hypermetabolic mass occupying left lung apex consistent with recurrent neoplasm; extensive metastatic disease involving chest, abdomen, pelvis, bony structures, muscles, and subcutaneous tissues.  -Her FO andPD-L1 testresult from [place] node biopsyis still pending.  -She began first line chemo with Carboplatin and Taxol with Bevacizumab q3weekson 01/09/21, which will cover both lung and breast cancer if she has two primaries.Goal of therapy is palliative, to control disease and prolong her life. Plan to restage 3 months into treatment -s/p cycle 1 he had moderate fatigue, low appetite, weight loss, and headaches. With  cycle   2 she was changed to 2 weeks on/1 week off starting 5/16. 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 This would be unusual. We plan to discuss her case in H/N tumor board.  -Ms. Aderman appears stable. She is s/p C2D1 carbo/taxol and beva. She had an episode of SVT and required ED visit, she was treated and had no recurrent episodes. She otherwise tolerated treatment moderately well with low appetite, no further weight loss, fatigue, and nausea.  -we reviewed symptom management and her med list extensively  -labs reviewed. FO still pending. Proceed with C2D8 carbo/taxol 02/06/21 as planned -will add on 500 cc NS with treatment tomorrow and 1 L NS next week for supportive care.  -F/up in 2 weeks with cycle 3, or sooner if needed  2. Pain in bilateral scapula secondary to #1 -She has pain to her left scapula, rated 5-6/10on4/14/22, at location of metastatic tumor -Dr. Squirediscussed with patient in 11/3020 to hold on palliative Radiation for now.  -She had to increase her Oxycodone 5mg to q4hours and takes 5-6 tabs daily. For better control, Dr. Feng started long acting MS Contin 15mg BID. She developed nausea and only took for a few days then stopped -S/p cycle 2 day 1, right scapular pain has improved significantly, left scapular pain is stable. She is not on MS contin but has been able to reduce oxycodone to 3-4 tabs per day since starting treatment, suggesting a clinical response to chemo -She is still on Zoloft. She has been advised to not take her sedative medications at the same time.  -headaches are intermittent and stable  3. Weight loss, Fatigue, Secondary to chemo  -She has taste change and low appetite from chemo. She has lost more weight lately.  -Referred to Dietician for management  -chemo reduced to weekly treatment 2 weeks on/1 week  off starting with C2, tolerated better -she had no improvement in her appetite after taking mirtazapine for 1 week, so she stopped. I discussed the nature of this drug and may have to try for longer to see a benefit. She agrees to restart.   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 Her2/2022mammogram showeda1.2cm mass in the 11:00 position of her left breast.2/10/22Biopsy showed invasive ductal carcinoma, triple negative -She underwent left breast lumpectomy and sentinel lymph node biopsyon 11/21/20.Her surgical pathology revealed a 1.3 cm grade 3 invasive ductal carcinoma, with negative lymph nodes. The anterior resection margin was positive. -given her PET scan findingsof multiple subcutaneous and intramuscular metastasis, Dr. 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  5.History ofLULstage IA, T1 a, N0, M0) non-small cell lung cancer, adenocarcinomaDxin 2013 + LLLstage Ib (T2, and 0, M0) non-small cell lung cancer, adenocarcinomaDx 02/2018.  6. COPD and HTN -She quit smoking in 2013 after she was diagnosed with lung cancer.  -She has SOB and uses inhaler daily. Not on oxygen.  7. SVT -following C2D1 carbo/taxol/beva on 5/16 she developed dizziness, SOB, and tachycardia at home on 5/21, called EMS -she was found to be in SVT with HR 210, normalized with adenosine x1   in the field  -she was brought to [facility] ED, labs showed elevated troponins, dehydration. No UTI -she was given IVF, IV fentanyl. Normalized and she was discharged home, no recurrent episodes.  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 Allred (per ED note), order placed  -F/up in 2 weeks, or sooner if needed  -Case reviewed with Dr. Feng    Orders Placed This Encounter  Procedures  . Ambulatory referral to Cardiology    Referral Priority:   Urgent    Referral Type:   Consultation    Referral Reason:   Specialty Services Required    Requested Specialty:   Cardiology    Number of Visits Requested:   1   All questions were answered. The patient knows to call the clinic with any problems, questions or concerns. No barriers to learning were detected. Total encounter time was 35 minutes.       K , NP 02/05/21    

## 2021-02-06 ENCOUNTER — Inpatient Hospital Stay: Payer: Medicare Other

## 2021-02-06 ENCOUNTER — Telehealth: Payer: Self-pay | Admitting: Nurse Practitioner

## 2021-02-06 VITALS — BP 97/75 | HR 100 | Temp 98.1°F | Resp 18

## 2021-02-06 DIAGNOSIS — Z5111 Encounter for antineoplastic chemotherapy: Secondary | ICD-10-CM | POA: Diagnosis not present

## 2021-02-06 DIAGNOSIS — C3492 Malignant neoplasm of unspecified part of left bronchus or lung: Secondary | ICD-10-CM

## 2021-02-06 DIAGNOSIS — C7989 Secondary malignant neoplasm of other specified sites: Secondary | ICD-10-CM | POA: Diagnosis not present

## 2021-02-06 DIAGNOSIS — C7951 Secondary malignant neoplasm of bone: Secondary | ICD-10-CM | POA: Diagnosis not present

## 2021-02-06 DIAGNOSIS — Z5112 Encounter for antineoplastic immunotherapy: Secondary | ICD-10-CM | POA: Diagnosis not present

## 2021-02-06 DIAGNOSIS — C3432 Malignant neoplasm of lower lobe, left bronchus or lung: Secondary | ICD-10-CM | POA: Diagnosis not present

## 2021-02-06 DIAGNOSIS — C770 Secondary and unspecified malignant neoplasm of lymph nodes of head, face and neck: Secondary | ICD-10-CM | POA: Diagnosis not present

## 2021-02-06 MED ORDER — HEPARIN SOD (PORK) LOCK FLUSH 100 UNIT/ML IV SOLN
500.0000 [IU] | Freq: Once | INTRAVENOUS | Status: AC | PRN
  Administered 2021-02-06: 500 [IU]
  Filled 2021-02-06: qty 5

## 2021-02-06 MED ORDER — SODIUM CHLORIDE 0.9 % IV SOLN
10.0000 mg | Freq: Once | INTRAVENOUS | Status: AC
  Administered 2021-02-06: 10 mg via INTRAVENOUS
  Filled 2021-02-06: qty 1

## 2021-02-06 MED ORDER — SODIUM CHLORIDE 0.9 % IV SOLN
Freq: Once | INTRAVENOUS | Status: AC
  Filled 2021-02-06: qty 250

## 2021-02-06 MED ORDER — FAMOTIDINE 20 MG IN NS 100 ML IVPB
20.0000 mg | Freq: Once | INTRAVENOUS | Status: AC
  Administered 2021-02-06: 20 mg via INTRAVENOUS
  Filled 2021-02-06: qty 100

## 2021-02-06 MED ORDER — DIPHENHYDRAMINE HCL 50 MG/ML IJ SOLN
50.0000 mg | Freq: Once | INTRAMUSCULAR | Status: AC
  Administered 2021-02-06: 50 mg via INTRAVENOUS
  Filled 2021-02-06: qty 1

## 2021-02-06 MED ORDER — SODIUM CHLORIDE 0.9 % IV SOLN
80.0000 mg/m2 | Freq: Once | INTRAVENOUS | Status: AC
  Administered 2021-02-06: 114 mg via INTRAVENOUS
  Filled 2021-02-06: qty 19

## 2021-02-06 MED ORDER — SODIUM CHLORIDE 0.9 % IV SOLN
150.0000 mg | Freq: Once | INTRAVENOUS | Status: AC
  Administered 2021-02-06: 150 mg via INTRAVENOUS
  Filled 2021-02-06: qty 5

## 2021-02-06 MED ORDER — PALONOSETRON HCL INJECTION 0.25 MG/5ML
0.2500 mg | Freq: Once | INTRAVENOUS | Status: AC
  Administered 2021-02-06: 0.25 mg via INTRAVENOUS
  Filled 2021-02-06: qty 5

## 2021-02-06 MED ORDER — SODIUM CHLORIDE 0.9% FLUSH
10.0000 mL | INTRAVENOUS | Status: DC | PRN
  Administered 2021-02-06: 10 mL
  Filled 2021-02-06: qty 10

## 2021-02-06 MED ORDER — SODIUM CHLORIDE 0.9 % IV SOLN
93.7500 mg | Freq: Once | INTRAVENOUS | Status: AC
  Administered 2021-02-06: 90 mg via INTRAVENOUS
  Filled 2021-02-06: qty 9

## 2021-02-06 NOTE — Patient Instructions (Signed)
[facility]    Discharge Instructions:  Thank you for choosing [facility] to provide your oncology and hematology care.   If you have a lab appointment with the [facility], please go directly to the [facility] and check in at the registration area.   Wear comfortable clothing and clothing appropriate for easy access to any Portacath or PICC line.   We strive to give you quality time with your provider. You may need to reschedule your appointment if you arrive late (15 or more minutes).  Arriving late affects you and other patients whose appointments are after yours.  Also, if you miss three or more appointments without notifying the office, you may be dismissed from the clinic at the provider's discretion.      For prescription refill requests, have your pharmacy contact our office and allow 72 hours for refills to be completed.    Today you received the following chemotherapy and/or immunotherapy agents Paclitaxel (TAXOL) & Carboplatin (PARAPLATIN).     To help prevent nausea and vomiting after your treatment, we encourage you to take your nausea medication as directed.  BELOW ARE SYMPTOMS THAT SHOULD BE REPORTED IMMEDIATELY: . *FEVER GREATER THAN 100.4 F (38 C) OR HIGHER . *CHILLS OR SWEATING . *NAUSEA AND VOMITING THAT IS NOT CONTROLLED WITH YOUR NAUSEA MEDICATION . *UNUSUAL SHORTNESS OF BREATH . *UNUSUAL BRUISING OR BLEEDING . *URINARY PROBLEMS (pain or burning when urinating, or frequent urination) . *BOWEL PROBLEMS (unusual diarrhea, constipation, pain near the anus) . TENDERNESS IN MOUTH AND THROAT WITH OR WITHOUT PRESENCE OF ULCERS (sore throat, sores in mouth, or a toothache) . UNUSUAL RASH, SWELLING OR PAIN  . UNUSUAL VAGINAL DISCHARGE OR ITCHING   Items with * indicate a potential emergency and should be followed up as soon as possible or go to the Emergency Department if any problems should occur.  Please show the  CHEMOTHERAPY ALERT CARD or IMMUNOTHERAPY ALERT CARD at check-in to the Emergency Department and triage nurse.  Should you have questions after your visit or need to cancel or reschedule your appointment, please contact [facility]  Dept: [phone]  and follow the prompts.  Office hours are 8:00 a.m. to 4:30 p.m. Monday - Friday. Please note that voicemails left after 4:00 p.m. may not be returned until the following business day.  We are closed weekends and major holidays. You have access to a nurse at all times for urgent questions. Please call the main number to the clinic Dept: [phone] and follow the prompts.   For any non-urgent questions, you may also contact your provider using MyChart. We now offer e-Visits for anyone 36 and older to request care online for non-urgent symptoms. For details visit [email or web address].   Also download the MyChart app! Go to the app store, search "MyChart", open the app, select [facility], and log in with your MyChart username and password.  Due to Covid, a mask is required upon entering the hospital/clinic. If you do not have a mask, one will be given to you upon arrival. For doctor visits, patients may have 1 support person aged 75 or older with them. For treatment visits, patients cannot have anyone with them due to current Covid guidelines and our immunocompromised population.   Paclitaxel injection What is this medicine? PACLITAXEL (PAK li TAX el) is a chemotherapy drug. It targets fast dividing cells, like cancer cells, and causes these cells to die. This medicine is used  to treat ovarian cancer, breast cancer, lung cancer, Kaposi's sarcoma, and other cancers. This medicine may be used for other purposes; ask your health care provider or pharmacist if you have questions. COMMON BRAND NAME(S): Onxol, Taxol What should I tell my health care provider before I take this medicine? They need to know if you have any of these  conditions:  history of irregular heartbeat  liver disease  low blood counts, like low white cell, platelet, or red cell counts  lung or breathing disease, like asthma  tingling of the fingers or toes, or other nerve disorder  an unusual or allergic reaction to paclitaxel, alcohol, polyoxyethylated castor oil, other chemotherapy, other medicines, foods, dyes, or preservatives  pregnant or trying to get pregnant  breast-feeding How should I use this medicine? This drug is given as an infusion into a vein. It is administered in a hospital or clinic by a specially trained health care professional. Talk to your pediatrician regarding the use of this medicine in children. Special care may be needed. Overdosage: If you think you have taken too much of this medicine contact a poison control center or emergency room at once. NOTE: This medicine is only for you. Do not share this medicine with others. What if I miss a dose? It is important not to miss your dose. Call your doctor or health care professional if you are unable to keep an appointment. What may interact with this medicine? Do not take this medicine with any of the following medications:  live virus vaccines This medicine may also interact with the following medications:  antiviral medicines for hepatitis, HIV or AIDS  certain antibiotics like erythromycin and clarithromycin  certain medicines for fungal infections like ketoconazole and itraconazole  certain medicines for seizures like carbamazepine, phenobarbital, phenytoin  gemfibrozil  nefazodone  rifampin  St. John's wort This list may not describe all possible interactions. Give your health care provider a list of all the medicines, herbs, non-prescription drugs, or dietary supplements you use. Also tell them if you smoke, drink alcohol, or use illegal drugs. Some items may interact with your medicine. What should I watch for while using this medicine? Your  condition will be monitored carefully while you are receiving this medicine. You will need important blood work done while you are taking this medicine. This medicine can cause serious allergic reactions. To reduce your risk you will need to take other medicine(s) before treatment with this medicine. If you experience allergic reactions like skin rash, itching or hives, swelling of the face, lips, or tongue, tell your doctor or health care professional right away. In some cases, you may be given additional medicines to help with side effects. Follow all directions for their use. This drug may make you feel generally unwell. This is not uncommon, as chemotherapy can affect healthy cells as well as cancer cells. Report any side effects. Continue your course of treatment even though you feel ill unless your doctor tells you to stop. Call your doctor or health care professional for advice if you get a fever, chills or sore throat, or other symptoms of a cold or flu. Do not treat yourself. This drug decreases your body's ability to fight infections. Try to avoid being around people who are sick. This medicine may increase your risk to bruise or bleed. Call your doctor or health care professional if you notice any unusual bleeding. Be careful brushing and flossing your teeth or using a toothpick because you may get an infection  or bleed more easily. If you have any dental work done, tell your dentist you are receiving this medicine. Avoid taking products that contain aspirin, acetaminophen, ibuprofen, naproxen, or ketoprofen unless instructed by your doctor. These medicines may hide a fever. Do not become pregnant while taking this medicine. Women should inform their doctor if they wish to become pregnant or think they might be pregnant. There is a potential for serious side effects to an unborn child. Talk to your health care professional or pharmacist for more information. Do not breast-feed an infant while  taking this medicine. Men are advised not to father a child while receiving this medicine. This product may contain alcohol. Ask your pharmacist or healthcare provider if this medicine contains alcohol. Be sure to tell all healthcare providers you are taking this medicine. Certain medicines, like metronidazole and disulfiram, can cause an unpleasant reaction when taken with alcohol. The reaction includes flushing, headache, nausea, vomiting, sweating, and increased thirst. The reaction can last from 30 minutes to several hours. What side effects may I notice from receiving this medicine? Side effects that you should report to your doctor or health care professional as soon as possible:  allergic reactions like skin rash, itching or hives, swelling of the face, lips, or tongue  breathing problems  changes in vision  fast, irregular heartbeat  high or low blood pressure  mouth sores  pain, tingling, numbness in the hands or feet  signs of decreased platelets or bleeding - bruising, pinpoint red spots on the skin, black, tarry stools, blood in the urine  signs of decreased red blood cells - unusually weak or tired, feeling faint or lightheaded, falls  signs of infection - fever or chills, cough, sore throat, pain or difficulty passing urine  signs and symptoms of liver injury like dark yellow or brown urine; general ill feeling or flu-like symptoms; light-colored stools; loss of appetite; nausea; right upper belly pain; unusually weak or tired; yellowing of the eyes or skin  swelling of the ankles, feet, hands  unusually slow heartbeat Side effects that usually do not require medical attention (report to your doctor or health care professional if they continue or are bothersome):  diarrhea  hair loss  loss of appetite  muscle or joint pain  nausea, vomiting  pain, redness, or irritation at site where injected  tiredness This list may not describe all possible side effects.  Call your doctor for medical advice about side effects. You may report side effects to FDA at 1-800-FDA-1088. Where should I keep my medicine? This drug is given in a hospital or clinic and will not be stored at home. NOTE: This sheet is a summary. It may not cover all possible information. If you have questions about this medicine, talk to your doctor, pharmacist, or health care provider.  2021 Elsevier/Gold Standard (2019-08-04 13:37:23)  Carboplatin injection What is this medicine? CARBOPLATIN (KAR boe pla tin) is a chemotherapy drug. It targets fast dividing cells, like cancer cells, and causes these cells to die. This medicine is used to treat ovarian cancer and many other cancers. This medicine may be used for other purposes; ask your health care provider or pharmacist if you have questions. COMMON BRAND NAME(S): Paraplatin What should I tell my health care provider before I take this medicine? They need to know if you have any of these conditions:  blood disorders  hearing problems  kidney disease  recent or ongoing radiation therapy  an unusual or allergic reaction to carboplatin,  cisplatin, other chemotherapy, other medicines, foods, dyes, or preservatives  pregnant or trying to get pregnant  breast-feeding How should I use this medicine? This drug is usually given as an infusion into a vein. It is administered in a hospital or clinic by a specially trained health care professional. Talk to your pediatrician regarding the use of this medicine in children. Special care may be needed. Overdosage: If you think you have taken too much of this medicine contact a poison control center or emergency room at once. NOTE: This medicine is only for you. Do not share this medicine with others. What if I miss a dose? It is important not to miss a dose. Call your doctor or health care professional if you are unable to keep an appointment. What may interact with this  medicine?  medicines for seizures  medicines to increase blood counts like filgrastim, pegfilgrastim, sargramostim  some antibiotics like amikacin, gentamicin, neomycin, streptomycin, tobramycin  vaccines Talk to your doctor or health care professional before taking any of these medicines:  acetaminophen  aspirin  ibuprofen  ketoprofen  naproxen This list may not describe all possible interactions. Give your health care provider a list of all the medicines, herbs, non-prescription drugs, or dietary supplements you use. Also tell them if you smoke, drink alcohol, or use illegal drugs. Some items may interact with your medicine. What should I watch for while using this medicine? Your condition will be monitored carefully while you are receiving this medicine. You will need important blood work done while you are taking this medicine. This drug may make you feel generally unwell. This is not uncommon, as chemotherapy can affect healthy cells as well as cancer cells. Report any side effects. Continue your course of treatment even though you feel ill unless your doctor tells you to stop. In some cases, you may be given additional medicines to help with side effects. Follow all directions for their use. Call your doctor or health care professional for advice if you get a fever, chills or sore throat, or other symptoms of a cold or flu. Do not treat yourself. This drug decreases your body's ability to fight infections. Try to avoid being around people who are sick. This medicine may increase your risk to bruise or bleed. Call your doctor or health care professional if you notice any unusual bleeding. Be careful brushing and flossing your teeth or using a toothpick because you may get an infection or bleed more easily. If you have any dental work done, tell your dentist you are receiving this medicine. Avoid taking products that contain aspirin, acetaminophen, ibuprofen, naproxen, or ketoprofen  unless instructed by your doctor. These medicines may hide a fever. Do not become pregnant while taking this medicine. Women should inform their doctor if they wish to become pregnant or think they might be pregnant. There is a potential for serious side effects to an unborn child. Talk to your health care professional or pharmacist for more information. Do not breast-feed an infant while taking this medicine. What side effects may I notice from receiving this medicine? Side effects that you should report to your doctor or health care professional as soon as possible:  allergic reactions like skin rash, itching or hives, swelling of the face, lips, or tongue  signs of infection - fever or chills, cough, sore throat, pain or difficulty passing urine  signs of decreased platelets or bleeding - bruising, pinpoint red spots on the skin, black, tarry stools, nosebleeds  signs of  decreased red blood cells - unusually weak or tired, fainting spells, lightheadedness  breathing problems  changes in hearing  changes in vision  chest pain  high blood pressure  low blood counts - This drug may decrease the number of white blood cells, red blood cells and platelets. You may be at increased risk for infections and bleeding.  nausea and vomiting  pain, swelling, redness or irritation at the injection site  pain, tingling, numbness in the hands or feet  problems with balance, talking, walking  trouble passing urine or change in the amount of urine Side effects that usually do not require medical attention (report to your doctor or health care professional if they continue or are bothersome):  hair loss  loss of appetite  metallic taste in the mouth or changes in taste This list may not describe all possible side effects. Call your doctor for medical advice about side effects. You may report side effects to FDA at 1-800-FDA-1088. Where should I keep my medicine? This drug is given in a  hospital or clinic and will not be stored at home. NOTE: This sheet is a summary. It may not cover all possible information. If you have questions about this medicine, talk to your doctor, pharmacist, or health care provider.  2021 Elsevier/Gold Standard (2007-12-08 14:38:05)

## 2021-02-06 NOTE — Telephone Encounter (Signed)
Scheduled per los. Called and left msg. Mailed printout  °

## 2021-02-08 ENCOUNTER — Telehealth: Payer: Self-pay | Admitting: Hematology

## 2021-02-08 NOTE — Telephone Encounter (Signed)
Scheduled appts per los. Called and left msg about added appts. Mailed printout

## 2021-02-16 ENCOUNTER — Other Ambulatory Visit: Payer: Self-pay

## 2021-02-16 DIAGNOSIS — C3492 Malignant neoplasm of unspecified part of left bronchus or lung: Secondary | ICD-10-CM

## 2021-02-17 ENCOUNTER — Inpatient Hospital Stay: Payer: Medicare Other | Attending: Nurse Practitioner

## 2021-02-17 ENCOUNTER — Other Ambulatory Visit: Payer: Self-pay | Admitting: *Deleted

## 2021-02-17 ENCOUNTER — Other Ambulatory Visit: Payer: Self-pay

## 2021-02-17 VITALS — BP 120/78 | HR 98 | Temp 97.6°F | Resp 19

## 2021-02-17 DIAGNOSIS — Z79899 Other long term (current) drug therapy: Secondary | ICD-10-CM | POA: Diagnosis not present

## 2021-02-17 DIAGNOSIS — Z5111 Encounter for antineoplastic chemotherapy: Secondary | ICD-10-CM | POA: Diagnosis not present

## 2021-02-17 DIAGNOSIS — C3432 Malignant neoplasm of lower lobe, left bronchus or lung: Secondary | ICD-10-CM | POA: Insufficient documentation

## 2021-02-17 DIAGNOSIS — C782 Secondary malignant neoplasm of pleura: Secondary | ICD-10-CM | POA: Insufficient documentation

## 2021-02-17 DIAGNOSIS — Z95828 Presence of other vascular implants and grafts: Secondary | ICD-10-CM

## 2021-02-17 DIAGNOSIS — C770 Secondary and unspecified malignant neoplasm of lymph nodes of head, face and neck: Secondary | ICD-10-CM | POA: Insufficient documentation

## 2021-02-17 DIAGNOSIS — C50212 Malignant neoplasm of upper-inner quadrant of left female breast: Secondary | ICD-10-CM | POA: Diagnosis not present

## 2021-02-17 DIAGNOSIS — C3492 Malignant neoplasm of unspecified part of left bronchus or lung: Secondary | ICD-10-CM

## 2021-02-17 DIAGNOSIS — Z5112 Encounter for antineoplastic immunotherapy: Secondary | ICD-10-CM | POA: Insufficient documentation

## 2021-02-17 DIAGNOSIS — Z171 Estrogen receptor negative status [ER-]: Secondary | ICD-10-CM | POA: Diagnosis not present

## 2021-02-17 MED ORDER — SODIUM CHLORIDE 0.9% FLUSH
10.0000 mL | INTRAVENOUS | Status: DC | PRN
  Administered 2021-02-17: 10 mL via INTRAVENOUS
  Filled 2021-02-17: qty 10

## 2021-02-17 MED ORDER — HEPARIN SOD (PORK) LOCK FLUSH 100 UNIT/ML IV SOLN
500.0000 [IU] | Freq: Once | INTRAVENOUS | Status: AC
  Administered 2021-02-17: 500 [IU] via INTRAVENOUS
  Filled 2021-02-17: qty 5

## 2021-02-17 MED ORDER — SODIUM CHLORIDE 0.9 % IV SOLN
Freq: Once | INTRAVENOUS | Status: AC
  Filled 2021-02-17: qty 250

## 2021-02-17 NOTE — Patient Instructions (Signed)

## 2021-02-19 ENCOUNTER — Inpatient Hospital Stay: Payer: Medicare Other

## 2021-02-19 ENCOUNTER — Inpatient Hospital Stay: Payer: Medicare Other | Admitting: Nutrition

## 2021-02-19 ENCOUNTER — Encounter: Payer: Self-pay | Admitting: Nurse Practitioner

## 2021-02-19 ENCOUNTER — Other Ambulatory Visit: Payer: Self-pay

## 2021-02-19 ENCOUNTER — Inpatient Hospital Stay (HOSPITAL_BASED_OUTPATIENT_CLINIC_OR_DEPARTMENT_OTHER): Payer: Medicare Other | Admitting: Nurse Practitioner

## 2021-02-19 VITALS — BP 119/86 | HR 98 | Temp 97.8°F | Resp 16 | Ht 64.0 in | Wt 98.9 lb

## 2021-02-19 DIAGNOSIS — C50212 Malignant neoplasm of upper-inner quadrant of left female breast: Secondary | ICD-10-CM | POA: Diagnosis not present

## 2021-02-19 DIAGNOSIS — Z5111 Encounter for antineoplastic chemotherapy: Secondary | ICD-10-CM | POA: Diagnosis not present

## 2021-02-19 DIAGNOSIS — C3492 Malignant neoplasm of unspecified part of left bronchus or lung: Secondary | ICD-10-CM

## 2021-02-19 DIAGNOSIS — C7989 Secondary malignant neoplasm of other specified sites: Secondary | ICD-10-CM

## 2021-02-19 DIAGNOSIS — C3432 Malignant neoplasm of lower lobe, left bronchus or lung: Secondary | ICD-10-CM | POA: Diagnosis not present

## 2021-02-19 DIAGNOSIS — C770 Secondary and unspecified malignant neoplasm of lymph nodes of head, face and neck: Secondary | ICD-10-CM | POA: Diagnosis not present

## 2021-02-19 DIAGNOSIS — C782 Secondary malignant neoplasm of pleura: Secondary | ICD-10-CM | POA: Diagnosis not present

## 2021-02-19 DIAGNOSIS — Z5112 Encounter for antineoplastic immunotherapy: Secondary | ICD-10-CM | POA: Diagnosis not present

## 2021-02-19 DIAGNOSIS — Z95828 Presence of other vascular implants and grafts: Secondary | ICD-10-CM

## 2021-02-19 LAB — TSH: TSH: 1.751 u[IU]/mL (ref 0.308–3.960)

## 2021-02-19 LAB — CBC WITH DIFFERENTIAL (CANCER CENTER ONLY)
Abs Immature Granulocytes: 0.03 10*3/uL (ref 0.00–0.07)
Basophils Absolute: 0.1 10*3/uL (ref 0.0–0.1)
Basophils Relative: 1 %
Eosinophils Absolute: 0.1 10*3/uL (ref 0.0–0.5)
Eosinophils Relative: 2 %
HCT: 35.8 % — ABNORMAL LOW (ref 36.0–46.0)
Hemoglobin: 12.4 g/dL (ref 12.0–15.0)
Immature Granulocytes: 1 %
Lymphocytes Relative: 17 %
Lymphs Abs: 1 10*3/uL (ref 0.7–4.0)
MCH: 30.2 pg (ref 26.0–34.0)
MCHC: 34.6 g/dL (ref 30.0–36.0)
MCV: 87.3 fL (ref 80.0–100.0)
Monocytes Absolute: 0.6 10*3/uL (ref 0.1–1.0)
Monocytes Relative: 9 %
Neutro Abs: 4.4 10*3/uL (ref 1.7–7.7)
Neutrophils Relative %: 70 %
Platelet Count: 306 10*3/uL (ref 150–400)
RBC: 4.1 MIL/uL (ref 3.87–5.11)
RDW: 16.4 % — ABNORMAL HIGH (ref 11.5–15.5)
WBC Count: 6.1 10*3/uL (ref 4.0–10.5)
nRBC: 0 % (ref 0.0–0.2)

## 2021-02-19 LAB — CMP (CANCER CENTER ONLY)
ALT: 12 U/L (ref 0–44)
AST: 16 U/L (ref 15–41)
Albumin: 3 g/dL — ABNORMAL LOW (ref 3.5–5.0)
Alkaline Phosphatase: 110 U/L (ref 38–126)
Anion gap: 12 (ref 5–15)
BUN: 9 mg/dL (ref 8–23)
CO2: 24 mmol/L (ref 22–32)
Calcium: 8.9 mg/dL (ref 8.9–10.3)
Chloride: 100 mmol/L (ref 98–111)
Creatinine: 0.54 mg/dL (ref 0.44–1.00)
GFR, Estimated: >60 mL/min (ref >60)
Glucose, Bld: 96 mg/dL (ref 70–99)
Potassium: 3.5 mmol/L (ref 3.5–5.1)
Sodium: 136 mmol/L (ref 135–145)
Total Bilirubin: 0.5 mg/dL (ref 0.3–1.2)
Total Protein: 6.3 g/dL — ABNORMAL LOW (ref 6.5–8.1)

## 2021-02-19 LAB — TOTAL PROTEIN, URINE DIPSTICK: Protein, ur: NEGATIVE mg/dL

## 2021-02-19 MED ORDER — ONDANSETRON HCL 8 MG PO TABS: 8.0000 mg | ORAL_TABLET | Freq: Two times a day (BID) | ORAL | 1 refills | Status: DC | PRN

## 2021-02-19 MED ORDER — ACETAMINOPHEN 325 MG PO TABS
ORAL_TABLET | ORAL | Status: AC
  Filled 2021-02-19: qty 2

## 2021-02-19 MED ORDER — PALONOSETRON HCL INJECTION 0.25 MG/5ML
0.2500 mg | Freq: Once | INTRAVENOUS | Status: AC
  Administered 2021-02-19: 0.25 mg via INTRAVENOUS

## 2021-02-19 MED ORDER — DIPHENHYDRAMINE HCL 50 MG/ML IJ SOLN
50.0000 mg | Freq: Once | INTRAMUSCULAR | Status: AC
  Administered 2021-02-19: 50 mg via INTRAVENOUS

## 2021-02-19 MED ORDER — ACETAMINOPHEN 325 MG PO TABS
650.0000 mg | ORAL_TABLET | Freq: Once | ORAL | Status: AC
Start: 2021-02-19 — End: 2021-02-19
  Administered 2021-02-19: 650 mg via ORAL

## 2021-02-19 MED ORDER — SODIUM CHLORIDE 0.9 % IV SOLN
93.7500 mg | Freq: Once | INTRAVENOUS | Status: AC
  Administered 2021-02-19: 90 mg via INTRAVENOUS
  Filled 2021-02-19: qty 9

## 2021-02-19 MED ORDER — PALONOSETRON HCL INJECTION 0.25 MG/5ML
INTRAVENOUS | Status: AC
  Filled 2021-02-19: qty 5

## 2021-02-19 MED ORDER — FAMOTIDINE 20 MG IN NS 100 ML IVPB
20.0000 mg | Freq: Once | INTRAVENOUS | Status: AC
  Administered 2021-02-19: 20 mg via INTRAVENOUS

## 2021-02-19 MED ORDER — PROCHLORPERAZINE MALEATE 10 MG PO TABS: 10.0000 mg | ORAL_TABLET | Freq: Four times a day (QID) | ORAL | 1 refills | Status: DC | PRN

## 2021-02-19 MED ORDER — SODIUM CHLORIDE 0.9% FLUSH
10.0000 mL | INTRAVENOUS | Status: DC | PRN
  Administered 2021-02-19: 10 mL
  Filled 2021-02-19: qty 10

## 2021-02-19 MED ORDER — SODIUM CHLORIDE 0.9 % IV SOLN
1200.0000 mg | Freq: Once | INTRAVENOUS | Status: AC
  Administered 2021-02-19: 1200 mg via INTRAVENOUS
  Filled 2021-02-19: qty 20

## 2021-02-19 MED ORDER — SODIUM CHLORIDE 0.9% FLUSH
10.0000 mL | INTRAVENOUS | Status: AC | PRN
Start: 2021-02-19 — End: 2021-02-19
  Administered 2021-02-19: 10 mL
  Filled 2021-02-19: qty 10

## 2021-02-19 MED ORDER — HEPARIN SOD (PORK) LOCK FLUSH 100 UNIT/ML IV SOLN
500.0000 [IU] | Freq: Once | INTRAVENOUS | Status: AC | PRN
  Administered 2021-02-19: 500 [IU]
  Filled 2021-02-19: qty 5

## 2021-02-19 MED ORDER — SODIUM CHLORIDE 0.9 % IV SOLN
10.0000 mg | Freq: Once | INTRAVENOUS | Status: AC
  Administered 2021-02-19: 10 mg via INTRAVENOUS
  Filled 2021-02-19: qty 10

## 2021-02-19 MED ORDER — SODIUM CHLORIDE 0.9 % IV SOLN
Freq: Once | INTRAVENOUS | Status: AC
  Filled 2021-02-19: qty 250

## 2021-02-19 MED ORDER — DIPHENHYDRAMINE HCL 50 MG/ML IJ SOLN
INTRAMUSCULAR | Status: AC
  Filled 2021-02-19: qty 1

## 2021-02-19 MED ORDER — FOSAPREPITANT DIMEGLUMINE INJECTION 150 MG
150.0000 mg | Freq: Once | INTRAVENOUS | Status: AC
  Administered 2021-02-19: 150 mg via INTRAVENOUS
  Filled 2021-02-19: qty 150

## 2021-02-19 MED ORDER — FAMOTIDINE 20 MG IN NS 100 ML IVPB
INTRAVENOUS | Status: AC
  Filled 2021-02-19: qty 100

## 2021-02-19 MED ORDER — SODIUM CHLORIDE 0.9 % IV SOLN
80.0000 mg/m2 | Freq: Once | INTRAVENOUS | Status: AC
  Administered 2021-02-19: 114 mg via INTRAVENOUS
  Filled 2021-02-19: qty 19

## 2021-02-19 MED ORDER — SODIUM CHLORIDE 0.9 % IV SOLN
15.0000 mg/kg | Freq: Once | INTRAVENOUS | Status: AC
  Administered 2021-02-19: 700 mg via INTRAVENOUS
  Filled 2021-02-19: qty 16

## 2021-02-19 NOTE — Progress Notes (Addendum)
[place]   [phone] [phone]   Clinic Follow up Note   Patient Care Team: [name], MD as PCP - General (Family Medicine) Collene Gobble, MD (Pulmonary Disease) Erroll Luna, MD as Consulting Physician (General Surgery) Truitt Merle, MD as Consulting Physician (Hematology) 02/19/2021  CHIEF COMPLAINT: Follow up metastatic lung cancer   SUMMARY OF ONCOLOGIC HISTORY: Oncology History Overview Note  Cancer Staging Malignant neoplasm of upper-inner quadrant of left breast in female, estrogen receptor negative ([place]) Staging form: Breast, AJCC 8th Edition - Clinical stage from 11/01/2020: Stage IB (cT1c, cN0, cM0, G3, ER-, PR-, HER2-) - Signed by Truitt Merle, MD on 11/08/2020 Stage prefix: Initial diagnosis - Pathologic stage from 11/21/2020: Stage IB (pT1c, pN0, cM0, G3, ER-, PR-, HER2-) - Signed by [name], NP on 12/20/2020 Stage prefix: Initial diagnosis Histologic grading system: 3 grade system  Non-small cell lung cancer ([place]) Staging form: Lung, AJCC 7th Edition - Clinical: Stage IA (Free text: Ia) - Signed by [name], MD on 12/28/2013 Laterality: Left Cancer stage: Ia - Pathologic: Ia - Signed by [name], MD on 12/28/2013 Laterality: Left Cancer stage: Ia    Non-small cell lung cancer ([place])  09/02/2011 Imaging   CT Chest  IMPRESSION:   1.  Interval clearing of right upper lobe pneumonia.  2.  Left upper lobe nodule is unchanged in the short interval from  07/29/2011.  Follow-up could be performed in 3 months to ensure  continued stability. This recommendation follows the consensus  statement: Guidelines for Management of Small Pulmonary Nodules  Detected on CT Scans:  A Statement from the [employer] as  published in Radiology 2005; 237:395-400.  Available online at:  [email or web address]  3.  Additional scattered pulmonary nodules can be  reevaluated on  future imaging as well.    11/06/2011 Imaging   PET IMPRESSION:   1.  Mild hypermetabolism which projects minimally cephalad to, but  is felt to correspond to the left upper lobe lung nodule.  Although  not within malignant range, given the small size of the nodule,  malignancy cannot be excluded.  Possible mild interval enlargement  of the nodule since 09/02/2011.  Consider repeat standard chest CT  to confirm interval enlargement.  Especially if interval  enlargement has occurred, tissue sampling would be suggested.  Alternatively, 65-monthfollow-up chest CT could be performed.  2.  No evidence of thoracic nodal or extrathoracic disease.  3. Vague hypermetabolism corresponding to a prominent right lobe of  the thyroid. Nonspecific.  Consider ultrasound correlation.IMPRESSION:   1.  Mild hypermetabolism which projects minimally cephalad to, but  is felt to correspond to the left upper lobe lung nodule.  Although  not within malignant range, given the small size of the nodule,  malignancy cannot be excluded.  Possible mild interval enlargement  of the nodule since 09/02/2011.  Consider repeat standard chest CT  to confirm interval enlargement.  Especially if interval  enlargement has occurred, tissue sampling would be suggested.  Alternatively, 376-monthollow-up chest CT could be performed.  2.  No evidence of thoracic nodal or extrathoracic disease.  3. Vague hypermetabolism corresponding to a prominent right lobe of  the thyroid. Nonspecific.  Consider ultrasound correlation.   12/17/2011 Surgery   Thoracoscopy by Dr [name] Diagnosis 1. Lung, wedge biopsy/resection, left upper lobe - INVASIVE WELL-DIFFERENTIATED ADENOCARCINOMA, SPANNING 1.2 CM. - NO LYMPH VASCULAR INVASION IDENTIFIED. - PLEURA IS UNINVOLVED. - MARGINS ARE NEGATIVE. -  SEE ONCOLOGY TEMPLATE. 2. Lymph node, biopsy, level 9 - ONE BENIGN LYMPH NODE WITH NO TUMOR SEEN (0/1). 3. Lymph node,  biopsy, level 9 - ONE BENIGN LYMPH NODE WITH NO TUMOR SEEN (0/1). 4. Lymph node, biopsy, 11 - ONE BENIGN LYMPH NODE WITH NO TUMOR SEEN (0/1). 5. Lymph node, biopsy, 11 #2 - ONE BENIGN LYMPH NODE WITH NO TUMOR SEEN (0/1). 6. Lymph node, biopsy, 5 - ONE BENIGN LYMPH NODE WITH NO TUMOR SEEN (0/1). 7. Lymph node, biopsy, 10 - ONE BENIGN LYMPH NODE WITH NO TUMOR SEEN (0/1). 8. Lymph node, biopsy, 11 #3 - ONE BENIGN LYMPH NODE WITH NO TUMOR SEEN (0/1). 9. Lung, resection (segmental or lobe), Remainder of left upper lobe - BENIGN LUNG PARENCHYMA WITH CHRONIC INFLAMMATION AND INCREASED PULMONARY MACROPHAGES. - THREE BENIGN LYMPH NODES IDENTIFIED (0/3). - NO TUMOR SEEN.    01/08/2012 Initial Diagnosis   Non-small cell lung cancer ([place])   02/10/2018 Imaging   CT Chest IMPRESSION: 1. Enlarging irregular pleural-based solid 1.9 cm pulmonary nodule in the superior segment left lower lobe, suspicious for malignancy, either recurrent disease or a metachronous primary bronchogenic carcinoma. Consider PET-CT for further characterization. 2. Additional scattered bilateral pulmonary nodules are stable and considered benign. 3. No noncontrast CT evidence of thoracic adenopathy. 4. Three-vessel coronary atherosclerosis. 5. Nonobstructing left nephrolithiasis.   Aortic Atherosclerosis (ICD10-I70.0) and Emphysema (ICD10-J43.9).   02/18/2018 PET scan   IMPRESSION: 1. Hypermetabolic apical nodule in the superior segment left lower lobe is highly worrisome for bronchogenic carcinoma. 2. No additional areas of abnormal hypermetabolism in the neck, chest, abdomen or pelvis. 3. Aortic atherosclerosis (ICD10-170.0). Coronary artery calcification. 4.  Emphysema (ICD10-J43.9). 5. Left renal stone.     03/06/2018 Pathology Results   Lung Biopsy  Diagnosis Lung, needle/core biopsy(ies), Left Lower Lobe - ADENOCARCINOMA. Microscopic Comment There is likely sufficient tissue for additional studies if  requested (block 1B). Dr. Lyndon Code has reviewed the case.   04/08/2018 Surgery   Left Video assisted Thoracoscopy by Dr Roxan Hockey  Diagnosis Lung, wedge biopsy/resection, Left Lower Lobe - INVASIVE ADENOCARCINOMA, MODERATELY DIFFERENTIATED, SPANNING 1.3 CM. - ADENOCARCINOMA INVOLVES VISCERAL PLEURA. - ONE BENIGN INTRAPULMONARY LYMPH NODE (0/1). - LYMPHOVASCULAR INVASION IS IDENTIFIED, FOCAL. - THE SURGICAL RESECTION MARGINS ARE NEGATIVE FOR CARCINOMA. - SEE ONCOLOGY TABLE BELOW.    08/01/2020 Imaging   CT Chest  IMPRESSION: Stable postop changes from prior left upper lobectomy. Stable small bilateral pulmonary nodules. No new or progressive disease within the thorax.   Aortic Atherosclerosis (ICD10-I70.0) and Emphysema (ICD10-J43.9).   Malignant neoplasm of upper-inner quadrant of left breast in female, estrogen receptor negative ([place])  10/18/2020 Mammogram   Mammogram 10/18/20 at [facility] FINDINGS:  Cc and MLO views of bilateral breasts are submitted. There is a  spiculated mass in the palpable area upper left breast. The right  breast is negative.   Targeted ultrasound is performed, showing 1.2 x 0.9 x 1.2 cm  spiculated hypoechoic mass at the left breast 11 o'clock 8 cm from  nipple palpable area. This correlates to the mammographic mass.  Ultrasound of the left axilla is negative.   IMPRESSION:  Highly suspicious findings.     10/26/2020 Initial Biopsy   Final Pathologic Diagnosis  10/26/20 at [facility] BREAST, LEFT 1:00 8 CM FROM NIPPLE, NEEDLE BIOPSY:              INVASIVE DUCTAL CARCINOMA, NOTTINGHAM GRADE 3. Comment   The Nottingham grade is 3 (3-tubule formation, 3-nuclear atypia, 3-mitoses).  This case was  reviewed by Dr. Vicenta Dunning and she is in essential agreement with the above diagnosis.  Final Pathologic Diagnosis   Prognostic Markers in Cancer   Block Number:  [id number] Diagnosis:  Invasive ductal carcinoma     Results:   Estrogen Receptor:  Negative  (<1%)   Progesterone Receptor:  Negative (<1%)   Her2:  Negative (score=1+)     Ki-67:  Percentage of tumor cells with nuclear positivity: 60 %      11/01/2020 Cancer Staging   Staging form: Breast, AJCC 8th Edition - Clinical stage from 11/01/2020: Stage IB (cT1c, cN0, cM0, G3, ER-, PR-, HER2-) - Signed by Truitt Merle, MD on 11/08/2020 Stage prefix: Initial diagnosis   11/08/2020 Initial Diagnosis   Malignant neoplasm of upper-inner quadrant of left breast in female, estrogen receptor negative ([place])   11/21/2020 Surgery   LEFT BREAST LUMPECTOMY WITH RADIOACTIVE SEED AND LEFT SENTINEL LYMPH NODE [place] and PAC placement by Dr Brantley Stage    11/21/2020 Pathology Results   FINAL MICROSCOPIC DIAGNOSIS:   A. BREAST, LEFT, LUMPECTOMY:  - Invasive ductal carcinoma, grade 3, 1.3 cm  - The anterior resection margin is widely involved by carcinoma  - Carcinoma is 0.2 cm from posterior margin  - Biopsy site changes  - See oncology table   B. BREAST, LEFT ADDITIONAL SUPERIOR MARGIN, EXCISION:  - Unremarkable fibroadipose tissue, negative for carcinoma   C. BREAST, LEFT ADDITIONAL LATERAL MARGIN, EXCISION:  - Unremarkable fibroadipose tissue, negative for carcinoma   D. BREAST, LEFT ADDITIONAL INFEROMEDIAL MARGIN, EXCISION:  - Unremarkable fibroadipose tissue, negative for carcinoma   E. LYMPH NODE, LEFT AXILLARY, SENTINEL, EXCISION:  - Lymph node, negative for carcinoma (0/1)    11/21/2020 Cancer Staging   Staging form: Breast, AJCC 8th Edition - Pathologic stage from 11/21/2020: Stage IB (pT1c, pN0, cM0, G3, ER-, PR-, HER2-) - Signed by [name], NP on 12/20/2020 Stage prefix: Initial diagnosis Histologic grading system: 3 grade system   Metastatic lung cancer (metastasis from lung to other site), left ([place])  12/11/2020 Imaging   CT Chest  IMPRESSION: 1. Signs of tumor recurrence within the apical portion of the left hemithorax with large rind of increased soft tissue. Tumor  extends into the left upper thoracic spine paravertebral soft tissues. 2. Pleural spread of disease is noted with mild progressive pleural thickening overlying the posterior and lateral left lower lung. 3. Interval development of increased interstitial markings within the left upper lobe adjacent to mass. In the absence of interval external beam radiation (since 08/01/2020) this is suspicious for lymphangitic spread of tumor. 4. Progressive soft tissue mass the within the paravertebral right upper lobe concerning for metastatic disease. 5. New enlarged anterior mediastinal and left supraclavicular lymph nodes compatible with metastatic adenopathy. 6. Indeterminate soft tissue attenuating nodular density between the upper pole of left kidney and left adrenal gland. Cannot exclude nodal metastasis or adrenal gland metastasis. 7. Aortic atherosclerosis.   Aortic Atherosclerosis (ICD10-I70.0).   12/22/2020 Pathology Results   A. LYMPH NODE, LEFT NECK, NEEDLE CORE BIOPSY:  - Metastatic adenocarcinoma.   COMMENT:   CK7 is positive.  CK20, TTF-1, Napsin-A, CDX-2, PAX 8, GATA-3 and ER are  negative.  The limited CK7 positive staining is compatible with origin  from lung; however, it does not exclude origin from other entities. If  applicable, there is likely sufficient tissue for ancillary studies.  Dr. Saralyn Pilar reviewed the case.    12/27/2020 Imaging   PET scan IMPRESSION: 1. Large hypermetabolic mass occupying  the left lung apex consistent with recurrent neoplasm. 2. Extensive metastatic disease involving the chest, abdomen, pelvis, bony structures, muscles and subcutaneous tissues.   01/07/2021 Initial Diagnosis   Metastatic lung cancer (metastasis from lung to other site), left ([place])   01/08/2021 -  Chemotherapy   First-line Carboplatin and Taxol with Bevacuzimab q3weeks starting 01/08/21. Due to significant fatigue and weight loss, reduced chemo to 2 weeks on/1 week off treatment  starting with C2 on 01/29/21.    02/19/2021 -  Chemotherapy    Patient is on Treatment Plan: LUNG NSCLC CARBOPLATIN + PACLITAXEL + BEVACIZUMAB Q21D X 1 CYCLE   Patient is on Antibody Plan: LUNG NSCLC ATEZOLIZUMAB Q21D      CURRENT THERAPY: First-line Carboplatin and Taxol with Bevacuzimab q3weeks starting 01/08/21. Due to significant fatigue and weight loss, reduced chemo to 2 weeks on/1 week off treatment starting with C2 on 01/29/21.added atezolizumab q21 days starting with cycle 3   INTERVAL HISTORY: Ms. Jorge returns for follow up and treatment as scheduled. She completed cycle 2 day1/day 8 taxol/carbo on 02/06/21, with beva on day 1. She received scheduled hydration on her week off.  She was able to taste better and eat more last week.  Back pain improved since starting chemo, MS Contin made her drowsy so she only takes oxycodone as needed, 3/day usually.  Taking less since she started treatment.  She has a dull headache today, usually manages with Tylenol.  Breathing is stable.  Bowels fluctuate from constipation to diarrhea on treatment.  She otherwise has no specific complaints.  Denies fever, chills, chest pain, leg edema, neuropathy, skin rash, mucositis, bleeding, or new pain.   MEDICAL HISTORY:  Past Medical History:  Diagnosis Date  . Breast cancer ([place])   . Cancer ([place])    Stage IA non-small cell lung cancer, left upper lobectomy 12/2011  . COPD (chronic obstructive pulmonary disease) ([place])   . Cough 07/29/11   started coughing up blood this am  . Cough   . Hypertension   . Kyphoscoliosis   . Pneumonia   . PONV (postoperative nausea and vomiting)   . Recurrent upper respiratory infection (URI)     SURGICAL HISTORY: Past Surgical History:  Procedure Laterality Date  . BREAST LUMPECTOMY WITH RADIOACTIVE SEED AND SENTINEL LYMPH NODE BIOPSY Left 11/21/2020   Procedure: LEFT BREAST LUMPECTOMY WITH RADIOACTIVE SEED AND LEFT SENTINEL LYMPH NODE [place];  Surgeon: Erroll Luna, MD;  Location: [place];  Service: General;  Laterality: Left;  PEC BLOCK; START TIME OF 11:00 AM FOR 90 MINUTES IN ROOM 2  . BRONCHOSCOPY    . CATARACT EXTRACTION, BILATERAL  2016  . EYE SURGERY    . INSERTION OF IBV VALVE Left 04/16/2018   Procedure: INSERTION OF INTERBRONCHIAL VALVE (IBV);  Surgeon: [name], MD;  Location: [facility];  Service: Thoracic;  Laterality: Left;  . INSERTION OF IBV VALVE N/A 06/25/2018   Procedure: REMOVAL OF THREE INTERBRONCHIAL VALVE (IBV);  Surgeon: [name], MD;  Location: [place];  Service: Thoracic;  Laterality: N/A;  . LOBECTOMY  12/17/2011   Procedure: LOBECTOMY;  Surgeon: [name], MD;  Location: [place];  Service: Thoracic;  Laterality: Left;  (L)VATS, WEDGE RESECTION, LEFT UPPER LOBECTOMY,  Multiple node biopsies  . PORTACATH PLACEMENT Left 11/21/2020   Procedure: INSERTION PORT-A-CATH;  Surgeon: Erroll Luna, MD;  Location: [place];  Service: General;  Laterality: Left;  . REMOVAL OF PLEURAL DRAINAGE CATHETER Left 05/06/2018   Procedure: REMOVAL  OF CHEST TUBE;  Surgeon: [name], MD;  Location: [place];  Service: Thoracic;  Laterality: Left;  . THYROID SURGERY  1980   1/2 thyroid removed - benign  . URETHRA SURGERY    . VIDEO ASSISTED THORACOSCOPY Left 04/08/2018   Procedure: REDO LEFT VIDEO ASSISTED THORACOSCOPY with left lower lobe wedge resection;  Surgeon: [name], MD;  Location: [place];  Service: Thoracic;  Laterality: Left;  Marland Kitchen VIDEO BRONCHOSCOPY N/A 04/16/2018   Procedure: VIDEO BRONCHOSCOPY;  Surgeon: [name], MD;  Location: [place];  Service: Thoracic;  Laterality: N/A;  . VIDEO BRONCHOSCOPY N/A 06/25/2018   Procedure: VIDEO BRONCHOSCOPY;  Surgeon: [name], MD;  Location[facility];  Service: Thoracic;  Laterality: N/A;    I have reviewed the social history and family history with the patient and they are unchanged from previous note.  ALLERGIES:  has No Known  Allergies.  MEDICATIONS:  Current Outpatient Medications  Medication Sig Dispense Refill  . acetaminophen (TYLENOL) 500 MG tablet Take 1,000 mg by mouth every 6 (six) hours as needed for moderate pain or headache.    Marland Kitchen acyclovir ointment (ZOVIRAX) 5 % Apply 1 application topically every 3 (three) hours as needed. 15 g 0  . albuterol (VENTOLIN HFA) 108 (90 Base) MCG/ACT inhaler Inhale 2 puffs into the lungs every 4 (four) hours as needed for wheezing or shortness of breath. For shortness of breath 8.51 g 5  . BEVESPI AEROSPHERE 9-4.8 MCG/ACT AERO INHALE 2 PUFFS INTO THE LUNGS 2 TIMES DAILY. (Patient taking differently: No sig reported) 10.7 g 11  . dexamethasone (DECADRON) 4 MG tablet Take 2 tablets (8 mg total) by mouth daily. Start the day after carboplatin chemotherapy for 3 days. (Patient taking differently: No sig reported) 30 tablet 1  . lidocaine-prilocaine (EMLA) cream Apply to affected area once (Patient taking differently: No sig reported) 30 g 3  . montelukast (SINGULAIR) 10 MG tablet Take 1 tablet (10 mg total) by mouth at bedtime. 30 tablet 5  . ondansetron (ZOFRAN) 8 MG tablet Take 1 tablet (8 mg total) by mouth 2 (two) times daily as needed for refractory nausea / vomiting. Start on day 3 after carboplatin chemo. 30 tablet 1  . oxyCODONE (OXY IR/ROXICODONE) 5 MG immediate release tablet Take 1 tablet (5 mg total) by mouth every 4 (four) hours as needed for severe pain. 90 tablet 0  . potassium chloride (KLOR-CON) 10 MEQ tablet Take 1 tablet (10 mEq total) by mouth daily. 7 tablet 0  . prochlorperazine (COMPAZINE) 10 MG tablet Take 1 tablet (10 mg total) by mouth every 6 (six) hours as needed (Nausea or vomiting). 30 tablet 1  . traMADol (ULTRAM) 50 MG tablet Take 1 tablet (50 mg total) by mouth every 6 (six) hours as needed. 60 tablet 0   No current facility-administered medications for this visit.   Facility-Administered Medications Ordered in Other Visits  Medication Dose Route  Frequency Provider Last Rate Last Admin  . CARBOplatin (PARAPLATIN) 90 mg in sodium chloride 0.9 % 100 mL chemo infusion  90 mg Intravenous Once Truitt Merle, MD      . heparin lock flush 100 unit/mL  500 Units Intracatheter Once PRN Truitt Merle, MD      . PACLitaxel (TAXOL) 114 mg in sodium chloride 0.9 % 250 mL chemo infusion (> 60m/m2)  80 mg/m2 (Order-Specific) Intravenous Once FTruitt Merle MD      . sodium chloride flush (NS) 0.9 % injection 10  mL  10 mL Intracatheter PRN Truitt Merle, MD        PHYSICAL EXAMINATION: ECOG PERFORMANCE STATUS: 1 - Symptomatic but completely ambulatory  Vitals:   02/19/21 1021  BP: 119/86  Pulse: 98  Resp: 16  Temp: 97.8 F (36.6 C)  SpO2: 96%   Filed Weights   02/19/21 1021  Weight: 98 lb 14.4 oz (44.9 kg)    GENERAL:alert, no distress and comfortable SKIN: No rash EYES: sclera clear NECK: Left supraclav/skin nodule 1 cm.  Palpable left parotid nodule LUNGS: normal breathing effort HEART: no lower extremity edema NEURO: alert & oriented x 3 with fluent speech, no focal motor/sensory deficits PAC without erythema  LABORATORY DATA:  I have reviewed the data as listed CBC Latest Ref Rng & Units 02/19/2021 02/05/2021 02/03/2021  WBC 4.0 - 10.5 K/uL 6.1 9.8 11.0(H)  Hemoglobin 12.0 - 15.0 g/dL 12.4 12.7 13.0  Hematocrit 36.0 - 46.0 % 35.8(L) 37.5 38.5  Platelets 150 - 400 K/uL 306 340 369     CMP Latest Ref Rng & Units 02/19/2021 02/05/2021 02/03/2021  Glucose 70 - 99 mg/dL 96 86 91  BUN 8 - 23 mg/dL 9 9 13   Creatinine 0.44 - 1.00 mg/dL 0.54 0.50 0.51  Sodium 135 - 145 mmol/L 136 134(L) 133(L)  Potassium 3.5 - 5.1 mmol/L 3.5 3.5 3.4(L)  Chloride 98 - 111 mmol/L 100 96(L) 98  CO2 22 - 32 mmol/L 24 24 23   Calcium 8.9 - 10.3 mg/dL 8.9 8.6(L) 8.7(L)  Total Protein 6.5 - 8.1 g/dL 6.3(L) 6.0(L) 5.7(L)  Total Bilirubin 0.3 - 1.2 mg/dL 0.5 0.6 0.5  Alkaline Phos 38 - 126 U/L 110 124 108  AST 15 - 41 U/L 16 24 17   ALT 0 - 44 U/L 12 12 13        RADIOGRAPHIC STUDIES: I have personally reviewed the radiological images as listed and agreed with the findings in the report. No results found.   ASSESSMENT & PLAN: Valerie Mosley a 70 y.o.femalewith    1.Metastatic Lung Adenocarcinoma, stage IV -Chest CT 12/11/20 showeda large soft tissue mass in the left apical portion of the lung, with direct invasion of the left upper thoracic spine,probable pleural metastasis  -Left supraclavicular lymph node biopsyon4/8/22 confirmed metastatic adenocarcinoma -PET scan 12/27/20 showed: large hypermetabolic mass occupying left lung apex consistent with recurrent neoplasm; extensive metastatic disease involving chest, abdomen, pelvis, bony structures, muscles, and subcutaneous tissues.  -Her FO andPD-L[facility] node biopsyis still pending.  -She began first line chemo with Carboplatin and Taxol with Bevacizumab q3weekson 01/09/21, which will cover both lung and breast cancer if she has two primaries.Goal of therapy is palliative, to control disease and prolong her life. Plan to restage 3 months into treatment -s/p cycle 1 she had moderate fatigue, low appetite, weight loss, and headaches. With cycle 2 she was changed to 2 weeks on/1 week off starting 5/16. 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  -brain MRI was reviewed in H/N tumor board, this is felt to be c/w metastatic lung cancer  S/p C2D1 carbo/taxol and beva she had an episode of SVT and required ED visit, she was treated and had no recurrent episodes. She otherwise tolerated treatment moderately well with low appetite, no further weight loss, fatigue, and nausea.  -FO still pending. Plan to add atezolizumab with cycle 3  taxol/carbo/beva starting today 02/19/21 -return for C3 D8 taxol/carbo next week, and f/up in 3 weeks with  cycle 4  2. Pain inbilateralscapula secondary to #1 -She has pain to her left scapula, rated 5-6/10on4/14/22, at location of metastatic tumor -Dr. Saul Fordyce with patient in 11/3020 to hold on palliative Radiation for now.  -She had to increase her Oxycodone 32m to q4hours and takes 5-6 tabs daily. For better control, Dr. FBurr Medicostarted long actingMS Contin 19mBID. She developed nausea and only took for a few days then stopped -S/p cycle 2 day 1, right scapular pain has improved significantly, left scapular pain is stable. She is not on MS contin but has been able to reduce oxycodone to 3-4 tabs per day since starting treatment, suggesting a clinical response to chemo -She is still on Zoloft. She has been advised to not take her sedative medications at the same time.  -headaches are intermittent and stable  3. Weight loss, Fatigue, Secondary to chemo  -She has taste change and low appetite from chemo. She has lost more weight lately.  -Referred to Dietician for management  -chemo reduced to weekly treatment 2 weeks on/1 week off starting with C2, tolerated better -she had no improvement in her appetite after taking mirtazapine for 1 week, so she stopped. 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 Her2/202251mogram showeda1.2cm mass in the 11:00 position of her left breast.2/10/22Biopsy showed invasive ductal carcinoma, triple negative -She underwent left breast lumpectomy and sentinel lymph node biopsyon 11/21/20.Her surgical pathology revealed a 1.3 cm grade 3 invasive ductal carcinoma, with negative lymph nodes. The anterior resection margin was positive. -given her PET scan findingsof multiple  subcutaneous and intramuscular metastasis, Dr. FenBurr Medicospects this is lung met  -current chemo for her lung met is also effective regimen for triple negative cancer -Due to her metastatic lung cancer, will hold on adjuvant radiation for now.  5.History ofLULstage IA, T1 a, N0, M0) non-small cell lung cancer, adenocarcinomaDxin 2013 + LLLstage Ib (T2, and 0, M0) non-small cell lung cancer, adenocarcinomaDx 02/2018.  6. COPD and HTN -She quit smoking in 2013 after she was diagnosed with lung cancer.  -She has SOB and uses inhaler daily. Not on oxygen. -stable  7. SVT -following C2D1 carbo/taxol/beva on 5/16 she developed dizziness, SOB, and tachycardia at home on 5/21, called EMS -she was found to be in SVT with HR 210, normalized with adenosine x1 in the field  -she was brought to [facility], labs showed elevated troponins, dehydration. No UTI -she was given IVF, IV fentanyl. Normalized and she was discharged home, no recurrent episodes.  -I placed urgent referral to cardiology, pending -no recurrent episodes thus far   Disposition: Ms. KirLycanpears stable.  She completed 2 cycles of Taxol/carbo on days 1 and 8, and bevacizumab on day 1, q. 21 days.  She tolerates treatment well with fatigue and bowel changes.  Side effects are well managed with supportive care at home and periodic IV hydration.  She is able to recover and function well.  Her PET and brain MRI were reviewed in H/N tumor board, although parotid is unlikely place for metastasis, the left parotid lesion is felt to most likely represent lung met. There is palpable nodule but she is  asymptomatic. Will monitor.  For continued weight loss, she will see dietician today. Mirtazapine was not helpful, I d/c'd from her list.   Labs reviewed, proceed with cycle 3 day 1 taxol/carbo and beva today as planned. We will start atezolizumab as well, continue q3 weeks. Will obtain TSH and f/up PD-L1.   Restage in July, after cycle 4.    F/up in 3 weeks with cycle 4.   The patient was seen with Dr. Burr Medico.    Orders Placed This Encounter  Procedures  . Total Protein, Urine dipstick    Standing Status:   Standing    Number of Occurrences:   50    Standing Expiration Date:   02/19/2022  . TSH    Standing Status:   Standing    Number of Occurrences:   50    Standing Expiration Date:   02/19/2022   All questions were answered. The patient knows to call the clinic with any problems, questions or concerns. No barriers to learning were detected.     Alla Feeling, NP 02/19/21   Addendum  I have seen the patient, examined her. I agree with the assessment and and plan and have edited the notes.   Patient is clinically stable, her pain has improved.  She is tolerating weekly CarboTaxol much better.  Her PD-L1 still pending.  Regardless of the results, I do think she benefit from immunotherapy, plan to add atezolizumab to her current regimen carboplatin, Taxol and bevacizumab.  Potential benefit and side effects discussed with her in detail, she agrees to proceed.  We will start today, and continue every 3 weeks.  We also reviewed her case in ENT tumor board last week, her hypermetabolic left parotid gland lesion felt to be likely metastatic lung cancer, we do not recommend biopsy for now, we will continue follow-up on her restaging scan in future.   Truitt Merle  02/19/2021

## 2021-02-19 NOTE — Progress Notes (Signed)
Nutrition follow-up completed with patient during infusion for metastatic lung/breast cancer.  Weight is decreased at 98 pounds on June 6, down from 100.6 pounds May 16.  Patient reports she is trying to eat the best she can.  She prefers very cold foods over warm or hot foods. She has been drinking Ensure max nutrition supplement providing 150 cal and 30 g of protein.  She would like to know how many supplements she should be drinking every day.  She denies other nutrition impact symptoms.  Estimated nutrition needs: 1600-1800 cal, 70-85 g protein, 1.8 L fluid.  Nutrition diagnosis: Severe malnutrition continues.  Intervention: Change Ensure max to ensure complete and consume 3 times daily between meals.  Ensure complete provides 350 cal and 30 g of protein per carton.  Provided coupons. Reviewed high-calorie, high-protein soft foods for patient to incorporate into meals.  Provided fact sheet. Provided support and encouragement.  Monitoring, evaluation, goals: Patient will tolerate increased calories and protein to promote weight stabilization.  Next visit: Monday, June 27 during infusion.  **Disclaimer: This note was dictated with voice recognition software. Similar sounding words can inadvertently be transcribed and this note may contain transcription errors which may not have been corrected upon publication of note.**

## 2021-02-19 NOTE — Patient Instructions (Signed)
[facility]    Discharge Instructions:  Thank you for choosing [facility] to provide your oncology and hematology care.   If you have a lab appointment with the [facility], please go directly to the [facility] and check in at the registration area.   Wear comfortable clothing and clothing appropriate for easy access to any Portacath or PICC line.   We strive to give you quality time with your provider. You may need to reschedule your appointment if you arrive late (15 or more minutes).  Arriving late affects you and other patients whose appointments are after yours.  Also, if you miss three or more appointments without notifying the office, you may be dismissed from the clinic at the provider's discretion.      For prescription refill requests, have your pharmacy contact our office and allow 72 hours for refills to be completed.    Today you received the following chemotherapy and/or immunotherapy agents Paclitaxel (TAXOL) & Carboplatin/tecentriq  (PARAPLATIN).     To help prevent nausea and vomiting after your treatment, we encourage you to take your nausea medication as directed.  BELOW ARE SYMPTOMS THAT SHOULD BE REPORTED IMMEDIATELY: . *FEVER GREATER THAN 100.4 F (38 C) OR HIGHER . *CHILLS OR SWEATING . *NAUSEA AND VOMITING THAT IS NOT CONTROLLED WITH YOUR NAUSEA MEDICATION . *UNUSUAL SHORTNESS OF BREATH . *UNUSUAL BRUISING OR BLEEDING . *URINARY PROBLEMS (pain or burning when urinating, or frequent urination) . *BOWEL PROBLEMS (unusual diarrhea, constipation, pain near the anus) . TENDERNESS IN MOUTH AND THROAT WITH OR WITHOUT PRESENCE OF ULCERS (sore throat, sores in mouth, or a toothache) . UNUSUAL RASH, SWELLING OR PAIN  . UNUSUAL VAGINAL DISCHARGE OR ITCHING   Items with * indicate a potential emergency and should be followed up as soon as possible or go to the Emergency Department if any problems should occur.  Please show  the CHEMOTHERAPY ALERT CARD or IMMUNOTHERAPY ALERT CARD at check-in to the Emergency Department and triage nurse.  Should you have questions after your visit or need to cancel or reschedule your appointment, please contact [facility]  Dept: [phone]  and follow the prompts.  Office hours are 8:00 a.m. to 4:30 p.m. Monday - Friday. Please note that voicemails left after 4:00 p.m. may not be returned until the following business day.  We are closed weekends and major holidays. You have access to a nurse at all times for urgent questions. Please call the main number to the clinic Dept: [phone] and follow the prompts.   For any non-urgent questions, you may also contact your provider using MyChart. We now offer e-Visits for anyone 4 and older to request care online for non-urgent symptoms. For details visit [email or web address].   Also download the MyChart app! Go to the app store, search "MyChart", open the app, select [facility], and log in with your MyChart username and password.  Due to Covid, a mask is required upon entering the hospital/clinic. If you do not have a mask, one will be given to you upon arrival. For doctor visits, patients may have 1 support person aged 41 or older with them. For treatment visits, patients cannot have anyone with them due to current Covid guidelines and our immunocompromised population.   Atezolizumab injection What is this medicine? ATEZOLIZUMAB (a te zoe LIZ ue mab) is a monoclonal antibody. It is used to treat bladder cancer (urothelial cancer), liver cancer, lung cancer, and melanoma.  This medicine may be used for other purposes; ask your health care provider or pharmacist if you have questions. COMMON BRAND NAME(S): Tecentriq What should I tell my health care provider before I take this medicine? They need to know if you have any of these conditions:  autoimmune diseases like Crohn's disease, ulcerative colitis, or  lupus  have had or planning to have an allogeneic stem cell transplant (uses someone else's stem cells)  history of organ transplant  history of radiation to the chest  nervous system problems like myasthenia gravis or Guillain-Barre syndrome  an unusual or allergic reaction to atezolizumab, other medicines, foods, dyes, or preservatives  pregnant or trying to get pregnant  breast-feeding How should I use this medicine? This medicine is for infusion into a vein. It is given by a health care professional in a hospital or clinic setting. A special MedGuide will be given to you before each treatment. Be sure to read this information carefully each time. Talk to your pediatrician regarding the use of this medicine in children. Special care may be needed. Overdosage: If you think you have taken too much of this medicine contact a poison control center or emergency room at once. NOTE: This medicine is only for you. Do not share this medicine with others. What if I miss a dose? It is important not to miss your dose. Call your doctor or health care professional if you are unable to keep an appointment. What may interact with this medicine? Interactions have not been studied. This list may not describe all possible interactions. Give your health care provider a list of all the medicines, herbs, non-prescription drugs, or dietary supplements you use. Also tell them if you smoke, drink alcohol, or use illegal drugs. Some items may interact with your medicine. What should I watch for while using this medicine? Your condition will be monitored carefully while you are receiving this medicine. You may need blood work done while you are taking this medicine. Do not become pregnant while taking this medicine or for at least 5 months after stopping it. Women should inform their doctor if they wish to become pregnant or think they might be pregnant. There is a potential for serious side effects to an  unborn child. Talk to your health care professional or pharmacist for more information. Do not breast-feed an infant while taking this medicine or for at least 5 months after the last dose. What side effects may I notice from receiving this medicine? Side effects that you should report to your doctor or health care professional as soon as possible:  allergic reactions like skin rash, itching or hives, swelling of the face, lips, or tongue  black, tarry stools  bloody or watery diarrhea  breathing problems  changes in vision  chest pain or chest tightness  chills  facial flushing  fever  headache  signs and symptoms of high blood sugar such as dizziness; dry mouth; dry skin; fruity breath; nausea; stomach pain; increased hunger or thirst; increased urination  signs and symptoms of liver injury like dark yellow or brown urine; general ill feeling or flu-like symptoms; light-colored stools; loss of appetite; nausea; right upper belly pain; unusually weak or tired; yellowing of the eyes or skin  stomach pain  trouble passing urine or change in the amount of urine Side effects that usually do not require medical attention (report to your doctor or health care professional if they continue or are bothersome):  bone pain  cough  diarrhea  joint pain  muscle pain  muscle weakness  swelling of arms or legs  tiredness  weight loss This list may not describe all possible side effects. Call your doctor for medical advice about side effects. You may report side effects to FDA at 1-800-FDA-1088. Where should I keep my medicine? This drug is given in a hospital or clinic and will not be stored at home. NOTE: This sheet is a summary. It may not cover all possible information. If you have questions about this medicine, talk to your doctor, pharmacist, or health care provider.  2021 [employer]vier/Gold Standard (2020-06-01 13:59:34)

## 2021-02-20 ENCOUNTER — Encounter: Payer: Self-pay | Admitting: Hematology

## 2021-02-20 MED ORDER — OXYCODONE HCL 5 MG PO TABS: 5.0000 mg | ORAL_TABLET | ORAL | 0 refills | Status: DC | PRN

## 2021-02-21 NOTE — Progress Notes (Signed)
[name]   [phone] Fax:([phone]) [phone]   Clinic Follow up Note   Patient Care Team: [name], MD as PCP - General (Family Medicine) Collene Gobble, MD (Pulmonary Disease) Erroll Luna, MD as Consulting Physician (General Surgery) Truitt Merle, MD as Consulting Physician (Hematology)  Date of Service:  02/26/2021  CHIEF COMPLAINT: f/u of metastatic lung cancer  SUMMARY OF ONCOLOGIC HISTORY: Oncology History Overview Note  Cancer Staging Malignant neoplasm of upper-inner quadrant of left breast in female, estrogen receptor negative ([name]) Staging form: Breast, AJCC 8th Edition - Clinical stage from 11/01/2020: Stage IB (cT1c, cN0, cM0, G3, ER-, PR-, HER2-) - Signed by Truitt Merle, MD on 11/08/2020 Stage prefix: Initial diagnosis - Pathologic stage from 11/21/2020: Stage IB (pT1c, pN0, cM0, G3, ER-, PR-, HER2-) - Signed by [name], NP on 12/20/2020 Stage prefix: Initial diagnosis Histologic grading system: 3 grade system  Non-small cell lung cancer ([place]) Staging form: Lung, AJCC 7th Edition - Clinical: Stage IA (Free text: Ia) - Signed by [name], MD on 12/28/2013 Laterality: Left Cancer stage: Ia - Pathologic: Ia - Signed by [name], MD on 12/28/2013 Laterality: Left Cancer stage: Ia    Non-small cell lung cancer ([name])  09/02/2011 Imaging   CT Chest  IMPRESSION:   1.  Interval clearing of right upper lobe pneumonia.  2.  Left upper lobe nodule is unchanged in the short interval from  07/29/2011.  Follow-up could be performed in 3 months to ensure  continued stability. This recommendation follows the consensus  statement: Guidelines for Management of Small Pulmonary Nodules  Detected on CT Scans:  A Statement from the [employer] as  published in Radiology 2005; 237:395-400.  Available online at:  [email or web address]  3.  Additional scattered pulmonary  nodules can be reevaluated on  future imaging as well.    11/06/2011 Imaging   PET IMPRESSION:   1.  Mild hypermetabolism which projects minimally cephalad to, but  is felt to correspond to the left upper lobe lung nodule.  Although  not within malignant range, given the small size of the nodule,  malignancy cannot be excluded.  Possible mild interval enlargement  of the nodule since 09/02/2011.  Consider repeat standard chest CT  to confirm interval enlargement.  Especially if interval  enlargement has occurred, tissue sampling would be suggested.  Alternatively, 38-monthfollow-up chest CT could be performed.  2.  No evidence of thoracic nodal or extrathoracic disease.  3. Vague hypermetabolism corresponding to a prominent right lobe of  the thyroid. Nonspecific.  Consider ultrasound correlation.IMPRESSION:   1.  Mild hypermetabolism which projects minimally cephalad to, but  is felt to correspond to the left upper lobe lung nodule.  Although  not within malignant range, given the small size of the nodule,  malignancy cannot be excluded.  Possible mild interval enlargement  of the nodule since 09/02/2011.  Consider repeat standard chest CT  to confirm interval enlargement.  Especially if interval  enlargement has occurred, tissue sampling would be suggested.  Alternatively, 374-monthollow-up chest CT could be performed.  2.  No evidence of thoracic nodal or extrathoracic disease.  3. Vague hypermetabolism corresponding to a prominent right lobe of  the thyroid. Nonspecific.  Consider ultrasound correlation.   12/17/2011 Surgery   Thoracoscopy by Dr [name] Diagnosis 1. Lung, wedge biopsy/resection, left upper lobe - INVASIVE WELL-DIFFERENTIATED ADENOCARCINOMA, SPANNING 1.2 CM. - NO LYMPH VASCULAR INVASION IDENTIFIED. - PLEURA IS UNINVOLVED. -  MARGINS ARE NEGATIVE. - SEE ONCOLOGY TEMPLATE. 2. Lymph node, biopsy, level 9 - ONE BENIGN LYMPH NODE WITH NO TUMOR SEEN  (0/1). 3. Lymph node, biopsy, level 9 - ONE BENIGN LYMPH NODE WITH NO TUMOR SEEN (0/1). 4. Lymph node, biopsy, 11 - ONE BENIGN LYMPH NODE WITH NO TUMOR SEEN (0/1). 5. Lymph node, biopsy, 11 #2 - ONE BENIGN LYMPH NODE WITH NO TUMOR SEEN (0/1). 6. Lymph node, biopsy, 5 - ONE BENIGN LYMPH NODE WITH NO TUMOR SEEN (0/1). 7. Lymph node, biopsy, 10 - ONE BENIGN LYMPH NODE WITH NO TUMOR SEEN (0/1). 8. Lymph node, biopsy, 11 #3 - ONE BENIGN LYMPH NODE WITH NO TUMOR SEEN (0/1). 9. Lung, resection (segmental or lobe), Remainder of left upper lobe - BENIGN LUNG PARENCHYMA WITH CHRONIC INFLAMMATION AND INCREASED PULMONARY MACROPHAGES. - THREE BENIGN LYMPH NODES IDENTIFIED (0/3). - NO TUMOR SEEN.    01/08/2012 Initial Diagnosis   Non-small cell lung cancer ([place])   02/10/2018 Imaging   CT Chest IMPRESSION: 1. Enlarging irregular pleural-based solid 1.9 cm pulmonary nodule in the superior segment left lower lobe, suspicious for malignancy, either recurrent disease or a metachronous primary bronchogenic carcinoma. Consider PET-CT for further characterization. 2. Additional scattered bilateral pulmonary nodules are stable and considered benign. 3. No noncontrast CT evidence of thoracic adenopathy. 4. Three-vessel coronary atherosclerosis. 5. Nonobstructing left nephrolithiasis.   Aortic Atherosclerosis (ICD10-I70.0) and Emphysema (ICD10-J43.9).   02/18/2018 PET scan   IMPRESSION: 1. Hypermetabolic apical nodule in the superior segment left lower lobe is highly worrisome for bronchogenic carcinoma. 2. No additional areas of abnormal hypermetabolism in the neck, chest, abdomen or pelvis. 3. Aortic atherosclerosis (ICD10-170.0). Coronary artery calcification. 4.  Emphysema (ICD10-J43.9). 5. Left renal stone.     03/06/2018 Pathology Results   Lung Biopsy  Diagnosis Lung, needle/core biopsy(ies), Left Lower Lobe - ADENOCARCINOMA. Microscopic Comment There is likely sufficient tissue for  additional studies if requested (block 1B). Dr. Lyndon Code has reviewed the case.   04/08/2018 Surgery   Left Video assisted Thoracoscopy by Dr Roxan Hockey  Diagnosis Lung, wedge biopsy/resection, Left Lower Lobe - INVASIVE ADENOCARCINOMA, MODERATELY DIFFERENTIATED, SPANNING 1.3 CM. - ADENOCARCINOMA INVOLVES VISCERAL PLEURA. - ONE BENIGN INTRAPULMONARY LYMPH NODE (0/1). - LYMPHOVASCULAR INVASION IS IDENTIFIED, FOCAL. - THE SURGICAL RESECTION MARGINS ARE NEGATIVE FOR CARCINOMA. - SEE ONCOLOGY TABLE BELOW.    08/01/2020 Imaging   CT Chest  IMPRESSION: Stable postop changes from prior left upper lobectomy. Stable small bilateral pulmonary nodules. No new or progressive disease within the thorax.   Aortic Atherosclerosis (ICD10-I70.0) and Emphysema (ICD10-J43.9).   Malignant neoplasm of upper-inner quadrant of left breast in female, estrogen receptor negative ([place])  10/18/2020 Mammogram   Mammogram 10/18/20 at [facility] FINDINGS:  Cc and MLO views of bilateral breasts are submitted. There is a  spiculated mass in the palpable area upper left breast. The right  breast is negative.   Targeted ultrasound is performed, showing 1.2 x 0.9 x 1.2 cm  spiculated hypoechoic mass at the left breast 11 o'clock 8 cm from  nipple palpable area. This correlates to the mammographic mass.  Ultrasound of the left axilla is negative.   IMPRESSION:  Highly suspicious findings.     10/26/2020 Initial Biopsy   Final Pathologic Diagnosis  10/26/20 at [facility] BREAST, LEFT 1:00 8 CM FROM NIPPLE, NEEDLE BIOPSY:              INVASIVE DUCTAL CARCINOMA, NOTTINGHAM GRADE 3. Comment   The Nottingham grade is 3 (3-tubule formation, 3-nuclear atypia, 3-mitoses).  This case was reviewed by Dr. Vicenta Dunning and she is in essential agreement with the above diagnosis.  Final Pathologic Diagnosis   Prognostic Markers in Cancer   Block Number:  [id number] Diagnosis:  Invasive ductal carcinoma     Results:   Estrogen  Receptor:  Negative (<1%)   Progesterone Receptor:  Negative (<1%)   Her2:  Negative (score=1+)     Ki-67:  Percentage of tumor cells with nuclear positivity: 60 %      11/01/2020 Cancer Staging   Staging form: Breast, AJCC 8th Edition - Clinical stage from 11/01/2020: Stage IB (cT1c, cN0, cM0, G3, ER-, PR-, HER2-) - Signed by Truitt Merle, MD on 11/08/2020  Stage prefix: Initial diagnosis    11/08/2020 Initial Diagnosis   Malignant neoplasm of upper-inner quadrant of left breast in female, estrogen receptor negative ([place])   11/21/2020 Surgery   LEFT BREAST LUMPECTOMY WITH RADIOACTIVE SEED AND LEFT SENTINEL LYMPH NODE [place] and PAC placement by Dr Brantley Stage    11/21/2020 Pathology Results   FINAL MICROSCOPIC DIAGNOSIS:   A. BREAST, LEFT, LUMPECTOMY:  - Invasive ductal carcinoma, grade 3, 1.3 cm  - The anterior resection margin is widely involved by carcinoma  - Carcinoma is 0.2 cm from posterior margin  - Biopsy site changes  - See oncology table   B. BREAST, LEFT ADDITIONAL SUPERIOR MARGIN, EXCISION:  - Unremarkable fibroadipose tissue, negative for carcinoma   C. BREAST, LEFT ADDITIONAL LATERAL MARGIN, EXCISION:  - Unremarkable fibroadipose tissue, negative for carcinoma   D. BREAST, LEFT ADDITIONAL INFEROMEDIAL MARGIN, EXCISION:  - Unremarkable fibroadipose tissue, negative for carcinoma   E. LYMPH NODE, LEFT AXILLARY, SENTINEL, EXCISION:  - Lymph node, negative for carcinoma (0/1)    11/21/2020 Cancer Staging   Staging form: Breast, AJCC 8th Edition - Pathologic stage from 11/21/2020: Stage IB (pT1c, pN0, cM0, G3, ER-, PR-, HER2-) - Signed by [name], NP on 12/20/2020  Stage prefix: Initial diagnosis  Histologic grading system: 3 grade system    Metastatic lung cancer (metastasis from lung to other site), left ([place])  12/11/2020 Imaging   CT Chest  IMPRESSION: 1. Signs of tumor recurrence within the apical portion of the left hemithorax with large rind of  increased soft tissue. Tumor extends into the left upper thoracic spine paravertebral soft tissues. 2. Pleural spread of disease is noted with mild progressive pleural thickening overlying the posterior and lateral left lower lung. 3. Interval development of increased interstitial markings within the left upper lobe adjacent to mass. In the absence of interval external beam radiation (since 08/01/2020) this is suspicious for lymphangitic spread of tumor. 4. Progressive soft tissue mass the within the paravertebral right upper lobe concerning for metastatic disease. 5. New enlarged anterior mediastinal and left supraclavicular lymph nodes compatible with metastatic adenopathy. 6. Indeterminate soft tissue attenuating nodular density between the upper pole of left kidney and left adrenal gland. Cannot exclude nodal metastasis or adrenal gland metastasis. 7. Aortic atherosclerosis.   Aortic Atherosclerosis (ICD10-I70.0).   12/22/2020 Pathology Results   A. LYMPH NODE, LEFT NECK, NEEDLE CORE BIOPSY:  - Metastatic adenocarcinoma.   COMMENT:   CK7 is positive.  CK20, TTF-1, Napsin-A, CDX-2, PAX 8, GATA-3 and ER are  negative.  The limited CK7 positive staining is compatible with origin  from lung; however, it does not exclude origin from other entities. If  applicable, there is likely sufficient tissue for ancillary studies.  Dr. Saralyn Pilar reviewed the case.    12/27/2020 Imaging  PET scan IMPRESSION: 1. Large hypermetabolic mass occupying the left lung apex consistent with recurrent neoplasm. 2. Extensive metastatic disease involving the chest, abdomen, pelvis, bony structures, muscles and subcutaneous tissues.   01/07/2021 Initial Diagnosis   Metastatic lung cancer (metastasis from lung to other site), left ([place])   01/08/2021 -  Chemotherapy   First-line Carboplatin and Taxol with Bevacuzimab q3weeks starting 01/08/21. Due to significant fatigue and weight loss, reduced chemo to 2  weeks on/1 week off treatment starting with C2 on 01/29/21.  --Added Tecentriq from C3D1 (02/22/21).    01/30/2021 Imaging   MRI Brain  IMPRESSION: 1. No evidence of intracranial metastatic disease. 2. A 7 mm rim enhancing lesion in the right parietal bone may represent bone metastasis. 3. A 2.4 heterogeneously enhancing lesion within the deep lobe of the left parotid gland may represent metastasis versus primary parotid neoplasm. 4. Diffuse decrease of the T1 signal within the visualized spine may represent red marrow reconversion. However, marrow replacement pathologies cannot be entirely excluded.     01/30/2021 Imaging   Brain MRI  IMPRESSION: 1. No evidence of intracranial metastatic disease. 2. A 7 mm rim enhancing lesion in the right parietal bone may represent bone metastasis. 3. A 2.4 heterogeneously enhancing lesion within the deep lobe of the left parotid gland may represent metastasis versus primary parotid neoplasm. 4. Diffuse decrease of the T1 signal within the visualized spine may represent red marrow reconversion. However, marrow replacement pathologies cannot be entirely excluded   02/19/2021 -  Chemotherapy   Added Tecentriq q3weeks on 02/19/21 (In addition to CT from C3)      CURRENT THERAPY:  First-line Carboplatin and Taxol with Bevacuzimab q3weeks starting 01/08/21. Due to significant fatigue and weight loss, reduced chemo to 2 weeks on/1 week off treatment starting with C2 on 01/29/21.  --Added Tecentriq q3weeks on 02/19/21 (C3 CT)  INTERVAL HISTORY:  BRYTTNEY NETZER is here for a follow up. She was last seen by me 01/29/21 and seen by NP [name] in interim. She presents to the clinic alone. She notes she tolerated Tecentriq with headaches for 3 days. She used Tylenol and alternated with her oxycodone which mostly controlled her pain. She also has been more tired after Tecentriq. She notes right upper flank pain that can flare up to 8/10. When she moves this pain  flares. For her ongoing Constipation she had tried Miralax, dulcolax, raisins and prune juice that has not improved this.    REVIEW OF SYSTEMS:   Constitutional: Denies fevers, chills or abnormal weight loss Eyes: Denies blurriness of vision Ears, nose, mouth, throat, and face: Denies mucositis or sore throat Respiratory: Denies cough, dyspnea or wheezes Cardiovascular: Denies palpitation, chest discomfort or lower extremity swelling Gastrointestinal:  Denies nausea, heartburn or change in bowel habits Skin: Denies abnormal skin rashes MSK (+) Right upper flank pain  Lymphatics: Denies new lymphadenopathy or easy bruising Neurological:Denies numbness, tingling or new weaknesses Behavioral/Psych: Mood is stable, no new changes  All other systems were reviewed with the patient and are negative.  MEDICAL HISTORY:  Past Medical History:  Diagnosis Date   Breast cancer ([place])    Cancer ([place])    Stage IA non-small cell lung cancer, left upper lobectomy 12/2011   COPD (chronic obstructive pulmonary disease) ([facility])    Cough 07/29/11   started coughing up blood this am   Cough    Hypertension    Kyphoscoliosis    Pneumonia    PONV (postoperative nausea and vomiting)  Recurrent upper respiratory infection (URI)     SURGICAL HISTORY: Past Surgical History:  Procedure Laterality Date   BREAST LUMPECTOMY WITH RADIOACTIVE SEED AND SENTINEL LYMPH NODE BIOPSY Left 11/21/2020   Procedure: LEFT BREAST LUMPECTOMY WITH RADIOACTIVE SEED AND LEFT SENTINEL LYMPH NODE [place];  Surgeon: Erroll Luna, MD;  Location: [place];  Service: General;  Laterality: Left;  PEC BLOCK; START TIME OF 11:00 AM FOR 90 MINUTES IN ROOM 2   BRONCHOSCOPY     CATARACT EXTRACTION, BILATERAL  2016   EYE SURGERY     INSERTION OF IBV VALVE Left 04/16/2018   Procedure: INSERTION OF INTERBRONCHIAL VALVE (IBV);  Surgeon: [name], MD;  Location: [place];  Service: Thoracic;  Laterality: Left;   INSERTION OF IBV VALVE  N/A 06/25/2018   Procedure: REMOVAL OF THREE INTERBRONCHIAL VALVE (IBV);  Surgeon: [name], MD;  Location: [facility];  Service: Thoracic;  Laterality: N/A;   LOBECTOMY  12/17/2011   Procedure: LOBECTOMY;  Surgeon: [name], MD;  Location: [place];  Service: Thoracic;  Laterality: Left;  (L)VATS, WEDGE RESECTION, LEFT UPPER LOBECTOMY,  Multiple node biopsies   PORTACATH PLACEMENT Left 11/21/2020   Procedure: INSERTION PORT-A-CATH;  Surgeon: Erroll Luna, MD;  Location: [place];  Service: General;  Laterality: Left;   REMOVAL OF PLEURAL DRAINAGE CATHETER Left 05/06/2018   Procedure: REMOVAL OF CHEST TUBE;  Surgeon: [name], MD;  Location: [place];  Service: Thoracic;  Laterality: Left;   THYROID SURGERY  1980   1/2 thyroid removed - benign   URETHRA SURGERY     VIDEO ASSISTED THORACOSCOPY Left 04/08/2018   Procedure: REDO LEFT VIDEO ASSISTED THORACOSCOPY with left lower lobe wedge resection;  Surgeon: [name], MD;  Location: [facility] OR;  Service: Thoracic;  Laterality: Left;   VIDEO BRONCHOSCOPY N/A 04/16/2018   Procedure: VIDEO BRONCHOSCOPY;  Surgeon: [name], MD;  Location: [place];  Service: Thoracic;  Laterality: N/A;   VIDEO BRONCHOSCOPY N/A 06/25/2018   Procedure: VIDEO BRONCHOSCOPY;  Surgeon: [name], MD;  Location: [place];  Service: Thoracic;  Laterality: N/A;    I have reviewed the social history and family history with the patient and they are unchanged from previous note.  ALLERGIES:  has No Known Allergies.  MEDICATIONS:  Current Outpatient Medications  Medication Sig Dispense Refill   acetaminophen (TYLENOL) 500 MG tablet Take 1,000 mg by mouth every 6 (six) hours as needed for moderate pain or headache.     acyclovir ointment (ZOVIRAX) 5 % Apply 1 application topically every 3 (three) hours as needed. 15 g 0   albuterol (VENTOLIN HFA) 108 (90 Base) MCG/ACT inhaler Inhale 2 puffs into the lungs every 4 (four) hours as  needed for wheezing or shortness of breath. For shortness of breath 8.51 g 5   BEVESPI AEROSPHERE 9-4.8 MCG/ACT AERO INHALE 2 PUFFS INTO THE LUNGS 2 TIMES DAILY. (Patient taking differently: No sig reported) 10.7 g 11   dexamethasone (DECADRON) 4 MG tablet Take 2 tablets (8 mg total) by mouth daily. Start the day after carboplatin chemotherapy for 3 days. (Patient taking differently: No sig reported) 30 tablet 1   lidocaine-prilocaine (EMLA) cream Apply to affected area once (Patient taking differently: No sig reported) 30 g 3   montelukast (SINGULAIR) 10 MG tablet Take 1 tablet (10 mg total) by mouth at bedtime. 30 tablet 5   ondansetron (ZOFRAN) 8 MG tablet Take 1 tablet (8 mg total) by mouth 2 (two)  times daily as needed for refractory nausea / vomiting. Start on day 3 after carboplatin chemo. 30 tablet 1   oxyCODONE (OXY IR/ROXICODONE) 5 MG immediate release tablet Take 1 tablet (5 mg total) by mouth every 4 (four) hours as needed for severe pain. 90 tablet 0   potassium chloride (KLOR-CON) 10 MEQ tablet Take 1 tablet (10 mEq total) by mouth daily. 7 tablet 0   prochlorperazine (COMPAZINE) 10 MG tablet Take 1 tablet (10 mg total) by mouth every 6 (six) hours as needed (Nausea or vomiting). 30 tablet 1   traMADol (ULTRAM) 50 MG tablet Take 1 tablet (50 mg total) by mouth every 6 (six) hours as needed. 60 tablet 0   No current facility-administered medications for this visit.   Facility-Administered Medications Ordered in Other Visits  Medication Dose Route Frequency Provider Last Rate Last Admin   CARBOplatin (PARAPLATIN) 90 mg in sodium chloride 0.9 % 100 mL chemo infusion  90 mg Intravenous Once Truitt Merle, MD 218 mL/hr at 02/26/21 1336 90 mg at 02/26/21 1336    PHYSICAL EXAMINATION: ECOG PERFORMANCE STATUS: 2 - Symptomatic, <50% confined to bed  There were no vitals filed for this visit. There were no vitals filed for this visit.  GENERAL:alert, no distress and comfortable SKIN: skin  color, texture, turgor are normal, no rashes or significant lesions EYES: normal, Conjunctiva are pink and non-injected, sclera clear  NECK: supple, thyroid normal size, non-tender, without nodularity LYMPH:  no palpable lymphadenopathy in the cervical, axillary  LUNGS: clear to auscultation and percussion with normal breathing effort HEART: regular rate & rhythm and no murmurs and no lower extremity edema ABDOMEN:abdomen soft, non-tender and normal bowel sounds Musculoskeletal:no cyanosis of digits and no clubbing  NEURO: alert & oriented x 3 with fluent speech, no focal motor/sensory deficits  LABORATORY DATA:  I have reviewed the data as listed CBC Latest Ref Rng & Units 02/26/2021 02/19/2021 02/05/2021  WBC 4.0 - 10.5 K/uL 5.1 6.1 9.8  Hemoglobin 12.0 - 15.0 g/dL 12.4 12.4 12.7  Hematocrit 36.0 - 46.0 % 36.5 35.8(L) 37.5  Platelets 150 - 400 K/uL 284 306 340     CMP Latest Ref Rng & Units 02/26/2021 02/19/2021 02/05/2021  Glucose 70 - 99 mg/dL 82 96 86  BUN 8 - 23 mg/dL 14 9 9   Creatinine 0.44 - 1.00 mg/dL 0.53 0.54 0.50  Sodium 135 - 145 mmol/L 135 136 134(L)  Potassium 3.5 - 5.1 mmol/L 3.5 3.5 3.5  Chloride 98 - 111 mmol/L 96(L) 100 96(L)  CO2 22 - 32 mmol/L 25 24 24   Calcium 8.9 - 10.3 mg/dL 9.6 8.9 8.6(L)  Total Protein 6.5 - 8.1 g/dL 6.6 6.3(L) 6.0(L)  Total Bilirubin 0.3 - 1.2 mg/dL 0.5 0.5 0.6  Alkaline Phos 38 - 126 U/L 119 110 124  AST 15 - 41 U/L 28 16 24   ALT 0 - 44 U/L 15 12 12       RADIOGRAPHIC STUDIES: I have personally reviewed the radiological images as listed and agreed with the findings in the report. No results found.   ASSESSMENT & PLAN:  Valerie Mosley is a 70 y.o. female with   1. Metastatic Lung Adenocarcinoma, stage IV  -Chest CT 12/11/20 showed a large soft tissue mass in the left apical portion of the lung, with direct invasion of the left upper thoracic spine, probable pleural metastasis  -Left supraclavicular lymph node biopsy on 12/22/20  confirmed metastatic adenocarcinoma -PET scan 12/27/20 showed: large hypermetabolic mass occupying left  lung apex consistent with recurrent neoplasm; extensive metastatic disease involving chest, abdomen, pelvis, bony structures, muscles, and subcutaneous tissues.  -Her FO and PD-L1 test result from [facility] node biopsy is still pending. Given her insurance may not cover this testing, I will contact them for appeal. Will order PD-L1 separately. She is agreeable, with insurance coverage, she will not be able to pay for it.  -I started her on first-line chemo with Carboplatin and Taxol with Bevacuzimab q3weeks on 01/09/21, which will cover both lung and breast cancer if she has two primaries. Goal of therapy is to control disease and prolong her life. Will scan her in 3 months to monitor response.  -I added Immunotherapy Tecentriq q3weeks from C3D1 chemo on 02/19/21 -Her 01/30/21 Brain MRI was negative for metastasis.  -With recent increased headaches I will move her brain MRI to today or tomorrow, given lung cancer can lead to brain mets.  -After addition of Tecentriq she had worsened headaches for 3 days that was not completely controlled on pain medication but did resolved spontaneously.  Will monitor recovery until next Tecentriq.  -Labs reviewed and adequate to proceed with C3D8 CT and Beva today.  -F/u in 2 weeks with C4D1 and Tecentriq.  May consider reducing Tecentriq dose on next cycle due to headaches.   2. Pain in bilateral scapula secondary to #1 -She has pain to her left scapula, rated 5-6/10 on 12/28/20, at location of metastatic tumor -Dr. Isidore Moos discussed with patient in 11/3020 to hold on palliative Radiation for now.  -She has had to increase her Oxycodone 28m to q4hours and takes 5-6 tabs daily. For better control, I recommend she start long acting MS Contin 163mBID. She can continue Oxycodone 62m89ms needed for breakthrough pain (01/29/21). She is agreeable.   -With pain medication, she knows to  watch for constipation and use stool softener.  -Since starting chemo this pain has improved, but will fluctuate with back and shoulder pain.  -She is still on Zoloft. She has been advised to not take her sedative medications at the same time.  -She continues to have moderate intermittent headaches. She does not blurred vision. 01/31/21 Brain MRI negative. Headaches did worsen after start of Tecentriq, will monitor closely.    3. Weight loss, Fatigue, Constipation, Secondary to chemo  -She has taste change and low appetite from chemo. She has lost more weight lately.  -I will refer her to Dietician for management  -I will reduce her chemo dose with weekly treatment 2 weeks on/1 week off starting with C2. She is agreeable.  -Her constipation has not improved on Miralax, dulcolax, raisins, Prune juice. I recommend she increase Miralax to 2-3 times daily. If not enough, she can try Milk of Magnesium half bottle at a time to have a complete BM.    4. 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 Her 10/2020 mammogram showed a 1.2cm mass in the 11:00 position of her left breast. 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 The anterior resection margin was positive. -given her PET scan findings of multiple subcutaneous and intramuscular metastasis, I suspect this is part of her metastatic lung cancer  -I discussed  chemo for her lung met will is also effective regimen for triple negative cancer -Due to her metastatic lung cancer, will hold on adjuvant radiation for now.   5. History of LUL stage IA, T1 a, N0, M0) non-small cell lung cancer,  adenocarcinoma Dx in 2013 + LLL stage Ib (T2, and 0, M0) non-small cell lung cancer, adenocarcinoma Dx 02/2018.   6. COPD and HTN  -She quit smoking in 2013 after she was diagnosed with lung cancer.  -She has SOB and uses inhaler daily. Not on oxygen.    7. Right low chest pain  -She notes recent right flow lateral chest pain that flares up to 8/10 when she moves.  -Will obtain Xray to evaluate for fracture. She is agreeable (02/26/21)     Plan -Right ribcage Xray today  -Labs reviewed, adequate to proceed with C3D8 CT and beva today  -Lab, flush, F/u, chemo CT and Beva in 2, 3 weeks -Tecentriq in 2 weeks.    No problem-specific Assessment & Plan notes found for this encounter.   No orders of the defined types were placed in this encounter.  All questions were answered. The patient knows to call the clinic with any problems, questions or concerns. No barriers to learning was detected. The total time spent in the appointment was 30 minutes.     Truitt Merle, MD 02/26/2021   I, Joslyn Devon, am acting as scribe for Truitt Merle, MD.   I have reviewed the above documentation for accuracy and completeness, and I agree with the above.

## 2021-02-26 ENCOUNTER — Other Ambulatory Visit: Payer: Self-pay

## 2021-02-26 ENCOUNTER — Inpatient Hospital Stay: Payer: Medicare Other

## 2021-02-26 ENCOUNTER — Inpatient Hospital Stay (HOSPITAL_BASED_OUTPATIENT_CLINIC_OR_DEPARTMENT_OTHER): Payer: Medicare Other | Admitting: Hematology

## 2021-02-26 ENCOUNTER — Encounter: Payer: Self-pay | Admitting: Hematology

## 2021-02-26 VITALS — BP 118/78 | HR 88 | Temp 98.2°F | Resp 18

## 2021-02-26 DIAGNOSIS — Z5111 Encounter for antineoplastic chemotherapy: Secondary | ICD-10-CM | POA: Diagnosis not present

## 2021-02-26 DIAGNOSIS — C50212 Malignant neoplasm of upper-inner quadrant of left female breast: Secondary | ICD-10-CM | POA: Diagnosis not present

## 2021-02-26 DIAGNOSIS — C3492 Malignant neoplasm of unspecified part of left bronchus or lung: Secondary | ICD-10-CM | POA: Diagnosis not present

## 2021-02-26 DIAGNOSIS — C782 Secondary malignant neoplasm of pleura: Secondary | ICD-10-CM | POA: Diagnosis not present

## 2021-02-26 DIAGNOSIS — Z171 Estrogen receptor negative status [ER-]: Secondary | ICD-10-CM

## 2021-02-26 DIAGNOSIS — C3432 Malignant neoplasm of lower lobe, left bronchus or lung: Secondary | ICD-10-CM | POA: Diagnosis not present

## 2021-02-26 DIAGNOSIS — Z5112 Encounter for antineoplastic immunotherapy: Secondary | ICD-10-CM | POA: Diagnosis not present

## 2021-02-26 DIAGNOSIS — Z95828 Presence of other vascular implants and grafts: Secondary | ICD-10-CM

## 2021-02-26 DIAGNOSIS — C770 Secondary and unspecified malignant neoplasm of lymph nodes of head, face and neck: Secondary | ICD-10-CM | POA: Diagnosis not present

## 2021-02-26 LAB — CMP (CANCER CENTER ONLY)
ALT: 15 U/L (ref 0–44)
AST: 28 U/L (ref 15–41)
Albumin: 3.3 g/dL — ABNORMAL LOW (ref 3.5–5.0)
Alkaline Phosphatase: 119 U/L (ref 38–126)
Anion gap: 14 (ref 5–15)
BUN: 14 mg/dL (ref 8–23)
CO2: 25 mmol/L (ref 22–32)
Calcium: 9.6 mg/dL (ref 8.9–10.3)
Chloride: 96 mmol/L — ABNORMAL LOW (ref 98–111)
Creatinine: 0.53 mg/dL (ref 0.44–1.00)
GFR, Estimated: >60 mL/min (ref >60)
Glucose, Bld: 82 mg/dL (ref 70–99)
Potassium: 3.5 mmol/L (ref 3.5–5.1)
Sodium: 135 mmol/L (ref 135–145)
Total Bilirubin: 0.5 mg/dL (ref 0.3–1.2)
Total Protein: 6.6 g/dL (ref 6.5–8.1)

## 2021-02-26 LAB — CBC WITH DIFFERENTIAL (CANCER CENTER ONLY)
Abs Immature Granulocytes: 0.03 10*3/uL (ref 0.00–0.07)
Basophils Absolute: 0.1 10*3/uL (ref 0.0–0.1)
Basophils Relative: 1 %
Eosinophils Absolute: 0.1 10*3/uL (ref 0.0–0.5)
Eosinophils Relative: 3 %
HCT: 36.5 % (ref 36.0–46.0)
Hemoglobin: 12.4 g/dL (ref 12.0–15.0)
Immature Granulocytes: 1 %
Lymphocytes Relative: 21 %
Lymphs Abs: 1.1 10*3/uL (ref 0.7–4.0)
MCH: 30 pg (ref 26.0–34.0)
MCHC: 34 g/dL (ref 30.0–36.0)
MCV: 88.4 fL (ref 80.0–100.0)
Monocytes Absolute: 0.3 10*3/uL (ref 0.1–1.0)
Monocytes Relative: 6 %
Neutro Abs: 3.5 10*3/uL (ref 1.7–7.7)
Neutrophils Relative %: 68 %
Platelet Count: 284 10*3/uL (ref 150–400)
RBC: 4.13 MIL/uL (ref 3.87–5.11)
RDW: 16.6 % — ABNORMAL HIGH (ref 11.5–15.5)
WBC Count: 5.1 10*3/uL (ref 4.0–10.5)
nRBC: 0 % (ref 0.0–0.2)

## 2021-02-26 MED ORDER — FAMOTIDINE 20 MG IN NS 100 ML IVPB
20.0000 mg | Freq: Once | INTRAVENOUS | Status: AC
  Administered 2021-02-26: 20 mg via INTRAVENOUS

## 2021-02-26 MED ORDER — SODIUM CHLORIDE 0.9 % IV SOLN
150.0000 mg | Freq: Once | INTRAVENOUS | Status: AC
  Administered 2021-02-26: 150 mg via INTRAVENOUS
  Filled 2021-02-26: qty 150

## 2021-02-26 MED ORDER — SODIUM CHLORIDE 0.9 % IV SOLN
93.7500 mg | Freq: Once | INTRAVENOUS | Status: AC
  Administered 2021-02-26: 90 mg via INTRAVENOUS
  Filled 2021-02-26: qty 9

## 2021-02-26 MED ORDER — PALONOSETRON HCL INJECTION 0.25 MG/5ML
INTRAVENOUS | Status: AC
  Filled 2021-02-26: qty 5

## 2021-02-26 MED ORDER — DIPHENHYDRAMINE HCL 50 MG/ML IJ SOLN
50.0000 mg | Freq: Once | INTRAMUSCULAR | Status: AC
  Administered 2021-02-26: 50 mg via INTRAVENOUS

## 2021-02-26 MED ORDER — SODIUM CHLORIDE 0.9 % IV SOLN
80.0000 mg/m2 | Freq: Once | INTRAVENOUS | Status: AC
  Administered 2021-02-26: 114 mg via INTRAVENOUS
  Filled 2021-02-26: qty 19

## 2021-02-26 MED ORDER — PALONOSETRON HCL INJECTION 0.25 MG/5ML
0.2500 mg | Freq: Once | INTRAVENOUS | Status: AC
Start: 2021-02-26 — End: 2021-02-26
  Administered 2021-02-26: 0.25 mg via INTRAVENOUS

## 2021-02-26 MED ORDER — SODIUM CHLORIDE 0.9 % IV SOLN
10.0000 mg | Freq: Once | INTRAVENOUS | Status: AC
  Administered 2021-02-26: 10 mg via INTRAVENOUS
  Filled 2021-02-26: qty 10

## 2021-02-26 MED ORDER — DIPHENHYDRAMINE HCL 50 MG/ML IJ SOLN
INTRAMUSCULAR | Status: AC
  Filled 2021-02-26: qty 1

## 2021-02-26 MED ORDER — SODIUM CHLORIDE 0.9% FLUSH
10.0000 mL | Freq: Once | INTRAVENOUS | Status: AC
  Administered 2021-02-26: 10 mL
  Filled 2021-02-26: qty 10

## 2021-02-26 MED ORDER — SODIUM CHLORIDE 0.9 % IV SOLN
Freq: Once | INTRAVENOUS | Status: AC
Start: 2021-02-26 — End: 2021-02-26
  Filled 2021-02-26: qty 250

## 2021-02-26 MED ORDER — FAMOTIDINE 20 MG IN NS 100 ML IVPB
INTRAVENOUS | Status: AC
  Filled 2021-02-26: qty 100

## 2021-02-26 NOTE — Patient Instructions (Signed)
Implanted Port Home Guide An implanted port is a device that is placed under the skin. It is usually placed in the chest. The device can be used to give IV medicine, to take blood, or for dialysis. You may have an implanted port if: You need IV medicine that would be irritating to the small veins in your hands or arms. You need IV medicines, such as antibiotics, for a long period of time. You need IV nutrition for a long period of time. You need dialysis. When you have a port, your health care provider can choose to use the port instead of veins in your arms for these procedures. You may have fewer limitations when using a port than you would if you used other types of long-term IVs, and you will likely be able to return to normal activities afteryour incision heals. An implanted port has two main parts: Reservoir. The reservoir is the part where a needle is inserted to give medicines or draw blood. The reservoir is round. After it is placed, it appears as a small, raised area under your skin. Catheter. The catheter is a thin, flexible tube that connects the reservoir to a vein. Medicine that is inserted into the reservoir goes into the catheter and then into the vein. How is my port accessed? To access your port: A numbing cream may be placed on the skin over the port site. Your health care provider will put on a mask and sterile gloves. The skin over your port will be cleaned carefully with a germ-killing soap and allowed to dry. Your health care provider will gently pinch the port and insert a needle into it. Your health care provider will check for a blood return to make sure the port is in the vein and is not clogged. If your port needs to remain accessed to get medicine continuously (constant infusion), your health care provider will place a clear bandage (dressing) over the needle site. The dressing and needle will need to be changed every week, or as told by your health care provider. What  is flushing? Flushing helps keep the port from getting clogged. Follow instructions from your health care provider about how and when to flush the port. Ports are usually flushed with saline solution or a medicine called heparin. The need for flushing will depend on how the port is used: If the port is only used from time to time to give medicines or draw blood, the port may need to be flushed: Before and after medicines have been given. Before and after blood has been drawn. As part of routine maintenance. Flushing may be recommended every 4-6 weeks. If a constant infusion is running, the port may not need to be flushed. Throw away any syringes in a disposal container that is meant for sharp items (sharps container). You can buy a sharps container from a pharmacy, or you can make one by using an empty hard plastic bottle with a cover. How long will my port stay implanted? The port can stay in for as long as your health care provider thinks it is needed. When it is time for the port to come out, a surgery will be done to remove it. The surgery will be similar to the procedure that was done to putthe port in. Follow these instructions at home:  Flush your port as told by your health care provider. If you need an infusion over several days, follow instructions from your health care provider about how to take   care of your port site. Make sure you: Wash your hands with soap and water before you change your dressing. If soap and water are not available, use alcohol-based hand sanitizer. Change your dressing as told by your health care provider. Place any used dressings or infusion bags into a plastic bag. Throw that bag in the trash. Keep the dressing that covers the needle clean and dry. Do not get it wet. Do not use scissors or sharp objects near the tube. Keep the tube clamped, unless it is being used. Check your port site every day for signs of infection. Check for: Redness, swelling, or  pain. Fluid or blood. Pus or a bad smell. Protect the skin around the port site. Avoid wearing bra straps that rub or irritate the site. Protect the skin around your port from seat belts. Place a soft pad over your chest if needed. Bathe or shower as told by your health care provider. The site may get wet as long as you are not actively receiving an infusion. Return to your normal activities as told by your health care provider. Ask your health care provider what activities are safe for you. Carry a medical alert card or wear a medical alert bracelet at all times. This will let health care providers know that you have an implanted port in case of an emergency. Get help right away if: You have redness, swelling, or pain at the port site. You have fluid or blood coming from your port site. You have pus or a bad smell coming from the port site. You have a fever. Summary Implanted ports are usually placed in the chest for long-term IV access. Follow instructions from your health care provider about flushing the port and changing bandages (dressings). Take care of the area around your port by avoiding clothing that puts pressure on the area, and by watching for signs of infection. Protect the skin around your port from seat belts. Place a soft pad over your chest if needed. Get help right away if you have a fever or you have redness, swelling, pain, drainage, or a bad smell at the port site. This information is not intended to replace advice given to you by your health care provider. Make sure you discuss any questions you have with your healthcare provider. Document Revised: 01/17/2020 Document Reviewed: 01/17/2020 Elsevier Patient Education  2022 Elsevier Inc.  

## 2021-02-26 NOTE — Patient Instructions (Signed)
[facility]COL[facility]    Discharge Instructions:  Thank you for choosing [facility] to provide your oncology and hematology care.   If you have a lab appointment with the [facility], please go directly to the [facility] and check in at the registration area.   Wear comfortable clothing and clothing appropriate for easy access to any Portacath or PICC line.   We strive to give you quality time with your provider. You may need to reschedule your appointment if you arrive late (15 or more minutes).  Arriving late affects you and other patients whose appointments are after yours.  Also, if you miss three or more appointments without notifying the office, you may be dismissed from the clinic at the provider's discretion.      For prescription refill requests, have your pharmacy contact our office and allow 72 hours for refills to be completed.    Today you received the following chemotherapy and/or immunotherapy agents Paclitaxel (TAXOL) & Carboplatin/tecentriq  (PARAPLATIN).     To help prevent nausea and vomiting after your treatment, we encourage you to take your nausea medication as directed.  BELOW ARE SYMPTOMS THAT SHOULD BE REPORTED IMMEDIATELY: *FEVER GREATER THAN 100.4 F (38 C) OR HIGHER *CHILLS OR SWEATING *NAUSEA AND VOMITING THAT IS NOT CONTROLLED WITH YOUR NAUSEA MEDICATION *UNUSUAL SHORTNESS OF BREATH *UNUSUAL BRUISING OR BLEEDING *URINARY PROBLEMS (pain or burning when urinating, or frequent urination) *BOWEL PROBLEMS (unusual diarrhea, constipation, pain near the anus) TENDERNESS IN MOUTH AND THROAT WITH OR WITHOUT PRESENCE OF ULCERS (sore throat, sores in mouth, or a toothache) UNUSUAL RASH, SWELLING OR PAIN  UNUSUAL VAGINAL DISCHARGE OR ITCHING   Items with * indicate a potential emergency and should be followed up as soon as possible or go to the Emergency Department if any problems should occur.  Please show the CHEMOTHERAPY  ALERT CARD or IMMUNOTHERAPY ALERT CARD at check-in to the Emergency Department and triage nurse.  Should you have questions after your visit or need to cancel or reschedule your appointment, please contact [facility]  Dept: [phone]  and follow the prompts.  Office hours are 8:00 a.m. to 4:30 p.m. Monday - Friday. Please note that voicemails left after 4:00 p.m. may not be returned until the following business day.  We are closed weekends and major holidays. You have access to a nurse at all times for urgent questions. Please call the main number to the clinic Dept: [phone] and follow the prompts.   For any non-urgent questions, you may also contact your provider using MyChart. We now offer e-Visits for anyone 3 and older to request care online for non-urgent symptoms. For details visit [email or web address].   Also download the MyChart app! Go to the app store, search "MyChart", open the app, select [facility], and log in with your MyChart username and password.  Due to Covid, a mask is required upon entering the hospital/clinic. If you do not have a mask, one will be given to you upon arrival. For doctor visits, patients may have 1 support person aged 43 or older with them. For treatment visits, patients cannot have anyone with them due to current Covid guidelines and our immunocompromised population.   Atezolizumab injection What is this medicine? ATEZOLIZUMAB (a te zoe LIZ ue mab) is a monoclonal antibody. It is used to treat bladder cancer (urothelial cancer), liver cancer, lung cancer, and melanoma. This medicine may be used for other purposes; ask your  health care provider or pharmacist if you have questions. COMMON BRAND NAME(S): Tecentriq What should I tell my health care provider before I take this medicine? They need to know if you have any of these conditions: autoimmune diseases like Crohn's disease, ulcerative colitis, or lupus have had or  planning to have an allogeneic stem cell transplant (uses someone else's stem cells) history of organ transplant history of radiation to the chest nervous system problems like myasthenia gravis or Guillain-Barre syndrome an unusual or allergic reaction to atezolizumab, other medicines, foods, dyes, or preservatives pregnant or trying to get pregnant breast-feeding How should I use this medicine? This medicine is for infusion into a vein. It is given by a health care professional in a hospital or clinic setting. A special MedGuide will be given to you before each treatment. Be sure to read this information carefully each time. Talk to your pediatrician regarding the use of this medicine in children. Special care may be needed. Overdosage: If you think you have taken too much of this medicine contact a poison control center or emergency room at once. NOTE: This medicine is only for you. Do not share this medicine with others. What if I miss a dose? It is important not to miss your dose. Call your doctor or health care professional if you are unable to keep an appointment. What may interact with this medicine? Interactions have not been studied. This list may not describe all possible interactions. Give your health care provider a list of all the medicines, herbs, non-prescription drugs, or dietary supplements you use. Also tell them if you smoke, drink alcohol, or use illegal drugs. Some items may interact with your medicine. What should I watch for while using this medicine? Your condition will be monitored carefully while you are receiving this medicine. You may need blood work done while you are taking this medicine. Do not become pregnant while taking this medicine or for at least 5 months after stopping it. Women should inform their doctor if they wish to become pregnant or think they might be pregnant. There is a potential for serious side effects to an unborn child. Talk to your health care  professional or pharmacist for more information. Do not breast-feed an infant while taking this medicine or for at least 5 months after the last dose. What side effects may I notice from receiving this medicine? Side effects that you should report to your doctor or health care professional as soon as possible: allergic reactions like skin rash, itching or hives, swelling of the face, lips, or tongue black, tarry stools bloody or watery diarrhea breathing problems changes in vision chest pain or chest tightness chills facial flushing fever headache signs and symptoms of high blood sugar such as dizziness; dry mouth; dry skin; fruity breath; nausea; stomach pain; increased hunger or thirst; increased urination signs and symptoms of liver injury like dark yellow or brown urine; general ill feeling or flu-like symptoms; light-colored stools; loss of appetite; nausea; right upper belly pain; unusually weak or tired; yellowing of the eyes or skin stomach pain trouble passing urine or change in the amount of urine Side effects that usually do not require medical attention (report to your doctor or health care professional if they continue or are bothersome): bone pain cough diarrhea joint pain muscle pain muscle weakness swelling of arms or legs tiredness weight loss This list may not describe all possible side effects. Call your doctor for medical advice about side effects. You may report  side effects to FDA at 1-800-FDA-1088. Where should I keep my medicine? This drug is given in a hospital or clinic and will not be stored at home. NOTE: This sheet is a summary. It may not cover all possible information. If you have questions about this medicine, talk to your doctor, pharmacist, or health care provider.  2021 Elsevier/Gold Standard (2020-06-01 13:59:34)  TAKE MAGNESIUM CITRATE FOR SEVERE CONSTIPATION

## 2021-03-05 ENCOUNTER — Other Ambulatory Visit: Payer: Self-pay | Admitting: Hematology

## 2021-03-05 ENCOUNTER — Other Ambulatory Visit: Payer: Self-pay

## 2021-03-05 ENCOUNTER — Ambulatory Visit (HOSPITAL_COMMUNITY)
Admission: RE | Admit: 2021-03-05 | Discharge: 2021-03-05 | Disposition: A | Discharge status: Home / Self Care | Payer: Medicare Other | Source: Ambulatory Visit | Attending: Hematology | Admitting: Hematology

## 2021-03-05 DIAGNOSIS — S2231XA Fracture of one rib, right side, initial encounter for closed fracture: Secondary | ICD-10-CM | POA: Diagnosis not present

## 2021-03-05 DIAGNOSIS — Z85118 Personal history of other malignant neoplasm of bronchus and lung: Secondary | ICD-10-CM | POA: Diagnosis not present

## 2021-03-05 DIAGNOSIS — R918 Other nonspecific abnormal finding of lung field: Secondary | ICD-10-CM | POA: Diagnosis not present

## 2021-03-05 DIAGNOSIS — C3492 Malignant neoplasm of unspecified part of left bronchus or lung: Secondary | ICD-10-CM

## 2021-03-07 ENCOUNTER — Other Ambulatory Visit: Payer: Self-pay

## 2021-03-07 ENCOUNTER — Ambulatory Visit (INDEPENDENT_AMBULATORY_CARE_PROVIDER_SITE_OTHER): Payer: Medicare Other | Admitting: Internal Medicine

## 2021-03-07 DIAGNOSIS — I471 Supraventricular tachycardia, unspecified: Secondary | ICD-10-CM | POA: Insufficient documentation

## 2021-03-07 NOTE — Patient Instructions (Addendum)
Medication Instructions:  Your physician recommends that you continue on your current medications as directed. Please refer to the Current Medication list given to you today.  Labwork: None ordered.  Testing/Procedures: None ordered.  Follow-Up: Your physician wants you to follow-up in: as needed with Gregg Taylor, MD    Any Other Special Instructions Will Be Listed Below (If Applicable).  If you need a refill on your cardiac medications before your next appointment, please call your pharmacy.       

## 2021-03-07 NOTE — Progress Notes (Signed)
HPI [name] is referred for evaluation of SVT. She is a pleasant 70 yo woman with lung CA on chemotherapy. She has 2 more treatments scheduled. She notes that her HR has been increased since her treatments. She has lost weight. She was in her usual state of health until 5/21 when she developed rapid palpitations and was found to be in a narrow QRS tachy at around 200/min. The paramedics gave her IV adenosine with immediate termination of her SVT. She has not had any additional episodes. She has not had syncope.  No Known Allergies   Current Outpatient Medications  Medication Sig Dispense Refill   acetaminophen (TYLENOL) 500 MG tablet Take 1,000 mg by mouth every 6 (six) hours as needed for moderate pain or headache.     acyclovir ointment (ZOVIRAX) 5 % Apply 1 application topically every 3 (three) hours as needed. 15 g 0   albuterol (VENTOLIN HFA) 108 (90 Base) MCG/ACT inhaler Inhale 2 puffs into the lungs every 4 (four) hours as needed for wheezing or shortness of breath. For shortness of breath 8.51 g 5   BEVESPI AEROSPHERE 9-4.8 MCG/ACT AERO INHALE 2 PUFFS INTO THE LUNGS 2 TIMES DAILY. (Patient taking differently: Inhale 2 puffs into the lungs as needed (wheezing, shortness of breath).) 10.7 g 11   dexamethasone (DECADRON) 4 MG tablet Take 2 tablets (8 mg total) by mouth daily. Start the day after carboplatin chemotherapy for 3 days. (Patient taking differently: Take 4 mg by mouth See admin instructions. 4 mg 2 days after chemo.) 30 tablet 1   lidocaine-prilocaine (EMLA) cream Apply to affected area once (Patient taking differently: Apply 1 application topically See admin instructions. Apply to affected area or port before/after treatment.) 30 g 3   montelukast (SINGULAIR) 10 MG tablet Take 1 tablet (10 mg total) by mouth at bedtime. 30 tablet 5   ondansetron (ZOFRAN) 8 MG tablet Take 1 tablet (8 mg total) by mouth 2 (two) times daily as needed for refractory nausea / vomiting. Start  on day 3 after carboplatin chemo. 30 tablet 1   oxyCODONE (OXY IR/ROXICODONE) 5 MG immediate release tablet Take 1 tablet (5 mg total) by mouth every 4 (four) hours as needed for severe pain. 90 tablet 0   prochlorperazine (COMPAZINE) 10 MG tablet Take 1 tablet (10 mg total) by mouth every 6 (six) hours as needed (Nausea or vomiting). 30 tablet 1   No current facility-administered medications for this visit.     Past Medical History:  Diagnosis Date   Breast cancer ([facility])    Cancer ([facility])    Stage IA non-small cell lung cancer, left upper lobectomy 12/2011   COPD (chronic obstructive pulmonary disease) (HCC)    Cough 07/29/11   started coughing up blood this am   Cough    Hypertension    Kyphoscoliosis    Pneumonia    PONV (postoperative nausea and vomiting)    Recurrent upper respiratory infection (URI)     ROS:   All systems reviewed and negative except as noted in the HPI.   Past Surgical History:  Procedure Laterality Date   BREAST LUMPECTOMY WITH RADIOACTIVE SEED AND SENTINEL LYMPH NODE BIOPSY Left 11/21/2020   Procedure: LEFT BREAST LUMPECTOMY WITH RADIOACTIVE SEED AND LEFT SENTINEL LYMPH NODE Terminous;  Surgeon: Erroll Luna, MD;  Location: [facility];  Service: General;  Laterality: Left;  PEC BLOCK; START TIME OF 11:00 AM FOR 90 MINUTES IN ROOM 2   BRONCHOSCOPY  CATARACT EXTRACTION, BILATERAL  2016   EYE SURGERY     INSERTION OF IBV VALVE Left 04/16/2018   Procedure: INSERTION OF INTERBRONCHIAL VALVE (IBV);  Surgeon: [name], MD;  Location: [facility];  Service: Thoracic;  Laterality: Left;   INSERTION OF IBV VALVE N/A 06/25/2018   Procedure: REMOVAL OF THREE INTERBRONCHIAL VALVE (IBV);  Surgeon: [name], MD;  Location: [facility];  Service: Thoracic;  Laterality: N/A;   LOBECTOMY  12/17/2011   Procedure: LOBECTOMY;  Surgeon: [name], MD;  Location: [facility];  Service: Thoracic;  Laterality: Left;  (L)VATS, WEDGE RESECTION, LEFT UPPER LOBECTOMY,   Multiple node biopsies   PORTACATH PLACEMENT Left 11/21/2020   Procedure: INSERTION PORT-A-CATH;  Surgeon: Erroll Luna, MD;  Location: [facility];  Service: General;  Laterality: Left;   REMOVAL OF PLEURAL DRAINAGE CATHETER Left 05/06/2018   Procedure: REMOVAL OF CHEST TUBE;  Surgeon: [name], MD;  Location: [facility];  Service: Thoracic;  Laterality: Left;   THYROID SURGERY  1980   1/2 thyroid removed - benign   URETHRA SURGERY     VIDEO ASSISTED THORACOSCOPY Left 04/08/2018   Procedure: REDO LEFT VIDEO ASSISTED THORACOSCOPY with left lower lobe wedge resection;  Surgeon: [name], MD;  Location: [facility];  Service: Thoracic;  Laterality: Left;   VIDEO BRONCHOSCOPY N/A 04/16/2018   Procedure: VIDEO BRONCHOSCOPY;  Surgeon: [name], MD;  Location: [facility];  Service: Thoracic;  Laterality: N/A;   VIDEO BRONCHOSCOPY N/A 06/25/2018   Procedure: VIDEO BRONCHOSCOPY;  Surgeon: [name], MD;  Location: [facility];  Service: Thoracic;  Laterality: N/A;     Family History  Problem Relation Age of Onset   Cancer Father 61       metastatic cancer    Cancer Brother 2       brain tumor    Cancer Cousin        colon cancer   Cancer Cousin        lung cancer     Social History   Socioeconomic History   Marital status: Married    Spouse name: Not on file   Number of children: 1   Years of education: Not on file   Highest education level: Not on file  Occupational History   Occupation: computer data entry   Tobacco Use   Smoking status: Former    Packs/day: 0.50    Years: 40.00    Pack years: 20.00    Types: Cigarettes    Quit date: 12/16/2011    Years since quitting: 9.2   Smokeless tobacco: Never  Vaping Use   Vaping Use: Never used  Substance and Sexual Activity   Alcohol use: Yes    Alcohol/week: 14.0 standard drinks    Types: 7 Glasses of wine, 7 Cans of beer per week    Comment: 1 per day    Drug use: No   Sexual activity: Not Currently     Birth control/protection: Post-menopausal  Other Topics Concern   Not on file  Social History Narrative   Not on file   Social Determinants of Health   Financial Resource Strain: Not on file  Food Insecurity: No Food Insecurity   Worried About Running Out of Food in the Last Year: Never true   Ran Out of Food in the Last Year: Never true  Transportation Needs: No Transportation Needs   Lack of Transportation (Medical): No   Lack of Transportation (Non-Medical): No  Physical Activity: Not on file  Stress: Not on file  Social Connections: Not on file  Intimate Partner Violence: Not At Risk   Fear of Current or Ex-Partner: No   Emotionally Abused: No   Physically Abused: No   Sexually Abused: No     BP 110/70   Pulse 94   Ht 5\' 4"  (1.626 m)   Wt 93 lb 12.8 oz (42.5 kg)   SpO2 96%   BMI 16.10 kg/m   Physical Exam:  Well appearing NAD HEENT: Unremarkable Neck:  No JVD, no thyromegally Lymphatics:  No adenopathy Back:  No CVA tenderness Lungs:  Clear with no wheezes HEART:  Regular rate rhythm, no murmurs, no rubs, no clicks Abd:  soft, positive bowel sounds, no organomegally, no rebound, no guarding Ext:  2 plus pulses, no edema, no cyanosis, no clubbing Skin:  No rashes no nodules Neuro:  CN II through XII intact, motor grossly intact  Assess/Plan:  SVT - she has only had a single episode. I discussed the mechanism of the SVT. I have recommended she avoid caffeine and ETOH. She will undergo watchful waiting. If she had more SVT we could consider flecainide. Her advanced CA makes cathter ablation contra-indicated. HTN - her bp is well controlled now that she has lost so much weight.   Carleene Overlie Rondi Ivy,MD

## 2021-03-09 ENCOUNTER — Telehealth: Payer: Self-pay | Admitting: *Deleted

## 2021-03-09 ENCOUNTER — Inpatient Hospital Stay: Payer: Medicare Other

## 2021-03-09 ENCOUNTER — Other Ambulatory Visit: Payer: Self-pay

## 2021-03-09 ENCOUNTER — Other Ambulatory Visit: Payer: Self-pay | Admitting: Hematology

## 2021-03-09 VITALS — BP 129/81 | HR 83 | Temp 98.0°F | Resp 19

## 2021-03-09 DIAGNOSIS — C50212 Malignant neoplasm of upper-inner quadrant of left female breast: Secondary | ICD-10-CM | POA: Diagnosis not present

## 2021-03-09 DIAGNOSIS — Z5112 Encounter for antineoplastic immunotherapy: Secondary | ICD-10-CM | POA: Diagnosis not present

## 2021-03-09 DIAGNOSIS — Z95828 Presence of other vascular implants and grafts: Secondary | ICD-10-CM

## 2021-03-09 DIAGNOSIS — C770 Secondary and unspecified malignant neoplasm of lymph nodes of head, face and neck: Secondary | ICD-10-CM | POA: Diagnosis not present

## 2021-03-09 DIAGNOSIS — C782 Secondary malignant neoplasm of pleura: Secondary | ICD-10-CM | POA: Diagnosis not present

## 2021-03-09 DIAGNOSIS — C3432 Malignant neoplasm of lower lobe, left bronchus or lung: Secondary | ICD-10-CM | POA: Diagnosis not present

## 2021-03-09 DIAGNOSIS — Z5111 Encounter for antineoplastic chemotherapy: Secondary | ICD-10-CM | POA: Diagnosis not present

## 2021-03-09 MED ORDER — HEPARIN SOD (PORK) LOCK FLUSH 100 UNIT/ML IV SOLN
500.0000 [IU] | Freq: Once | INTRAVENOUS | Status: DC | PRN
  Filled 2021-03-09: qty 5

## 2021-03-09 MED ORDER — SODIUM CHLORIDE 0.9% FLUSH
10.0000 mL | Freq: Once | INTRAVENOUS | Status: DC | PRN
  Filled 2021-03-09: qty 10

## 2021-03-09 MED ORDER — SODIUM CHLORIDE 0.9 % IV SOLN
Freq: Once | INTRAVENOUS | Status: AC
  Filled 2021-03-09: qty 250

## 2021-03-09 NOTE — Patient Instructions (Signed)

## 2021-03-09 NOTE — Telephone Encounter (Signed)
-----   Message from Truitt Merle, MD sent at 03/06/2021  3:32 PM EDT ----- Please let pt know her x-ray result, no surgery needed, take pain meds if needed, thanks   Truitt Merle  03/06/2021

## 2021-03-09 NOTE — Telephone Encounter (Signed)
Left message for pt to return call on Monday for message from Dr Burr Medico.

## 2021-03-11 NOTE — Progress Notes (Signed)
[place]   [phone] Fax:([phone]) [phone]   Clinic Follow up Note   Patient Care Team: [name], MD as PCP - General (Family Medicine) Collene Gobble, MD (Pulmonary Disease) Erroll Luna, MD as Consulting Physician (General Surgery) Truitt Merle, MD as Consulting Physician (Hematology) 03/12/2021  CHIEF COMPLAINT: Follow up metastatic lung cancer   SUMMARY OF ONCOLOGIC HISTORY: Oncology History Overview Note  Cancer Staging Malignant neoplasm of upper-inner quadrant of left breast in female, estrogen receptor negative ([place]) Staging form: Breast, AJCC 8th Edition - Clinical stage from 11/01/2020: Stage IB (cT1c, cN0, cM0, G3, ER-, PR-, HER2-) - Signed by Truitt Merle, MD on 11/08/2020 Stage prefix: Initial diagnosis - Pathologic stage from 11/21/2020: Stage IB (pT1c, pN0, cM0, G3, ER-, PR-, HER2-) - Signed by [name], NP on 12/20/2020 Stage prefix: Initial diagnosis Histologic grading system: 3 grade system  Non-small cell lung cancer ([place]) Staging form: Lung, AJCC 7th Edition - Clinical: Stage IA (Free text: Ia) - Signed by [name], MD on 12/28/2013 Laterality: Left Cancer stage: Ia - Pathologic: Ia - Signed by [name], MD on 12/28/2013 Laterality: Left Cancer stage: Ia    Non-small cell lung cancer ([place])  09/02/2011 Imaging   CT Chest  IMPRESSION:   1.  Interval clearing of right upper lobe pneumonia.  2.  Left upper lobe nodule is unchanged in the short interval from  07/29/2011.  Follow-up could be performed in 3 months to ensure  continued stability. This recommendation follows the consensus  statement: Guidelines for Management of Small Pulmonary Nodules  Detected on CT Scans:  A Statement from the [employer] as  published in Radiology 2005; 237:395-400.  Available online at:  [email or web address]/.  3.  Additional scattered pulmonary nodules can be  reevaluated on  future imaging as well.    11/06/2011 Imaging   PET IMPRESSION:   1.  Mild hypermetabolism which projects minimally cephalad to, but  is felt to correspond to the left upper lobe lung nodule.  Although  not within malignant range, given the small size of the nodule,  malignancy cannot be excluded.  Possible mild interval enlargement  of the nodule since 09/02/2011.  Consider repeat standard chest CT  to confirm interval enlargement.  Especially if interval  enlargement has occurred, tissue sampling would be suggested.  Alternatively, 85-monthfollow-up chest CT could be performed.  2.  No evidence of thoracic nodal or extrathoracic disease.  3. Vague hypermetabolism corresponding to a prominent right lobe of  the thyroid. Nonspecific.  Consider ultrasound correlation.IMPRESSION:   1.  Mild hypermetabolism which projects minimally cephalad to, but  is felt to correspond to the left upper lobe lung nodule.  Although  not within malignant range, given the small size of the nodule,  malignancy cannot be excluded.  Possible mild interval enlargement  of the nodule since 09/02/2011.  Consider repeat standard chest CT  to confirm interval enlargement.  Especially if interval  enlargement has occurred, tissue sampling would be suggested.  Alternatively, 317-monthollow-up chest CT could be performed.  2.  No evidence of thoracic nodal or extrathoracic disease.  3. Vague hypermetabolism corresponding to a prominent right lobe of  the thyroid. Nonspecific.  Consider ultrasound correlation.   12/17/2011 Surgery   Thoracoscopy by Dr [name] Diagnosis 1. Lung, wedge biopsy/resection, left upper lobe - INVASIVE WELL-DIFFERENTIATED ADENOCARCINOMA, SPANNING 1.2 CM. - NO LYMPH VASCULAR INVASION IDENTIFIED. - PLEURA IS UNINVOLVED. - MARGINS ARE NEGATIVE. -  SEE ONCOLOGY TEMPLATE. 2. Lymph node, biopsy, level 9 - ONE BENIGN LYMPH NODE WITH NO TUMOR SEEN (0/1). 3. Lymph node,  biopsy, level 9 - ONE BENIGN LYMPH NODE WITH NO TUMOR SEEN (0/1). 4. Lymph node, biopsy, 11 - ONE BENIGN LYMPH NODE WITH NO TUMOR SEEN (0/1). 5. Lymph node, biopsy, 11 #2 - ONE BENIGN LYMPH NODE WITH NO TUMOR SEEN (0/1). 6. Lymph node, biopsy, 5 - ONE BENIGN LYMPH NODE WITH NO TUMOR SEEN (0/1). 7. Lymph node, biopsy, 10 - ONE BENIGN LYMPH NODE WITH NO TUMOR SEEN (0/1). 8. Lymph node, biopsy, 11 #3 - ONE BENIGN LYMPH NODE WITH NO TUMOR SEEN (0/1). 9. Lung, resection (segmental or lobe), Remainder of left upper lobe - BENIGN LUNG PARENCHYMA WITH CHRONIC INFLAMMATION AND INCREASED PULMONARY MACROPHAGES. - THREE BENIGN LYMPH NODES IDENTIFIED (0/3). - NO TUMOR SEEN.    01/08/2012 Initial Diagnosis   Non-small cell lung cancer ([place])   02/10/2018 Imaging   CT Chest IMPRESSION: 1. Enlarging irregular pleural-based solid 1.9 cm pulmonary nodule in the superior segment left lower lobe, suspicious for malignancy, either recurrent disease or a metachronous primary bronchogenic carcinoma. Consider PET-CT for further characterization. 2. Additional scattered bilateral pulmonary nodules are stable and considered benign. 3. No noncontrast CT evidence of thoracic adenopathy. 4. Three-vessel coronary atherosclerosis. 5. Nonobstructing left nephrolithiasis.   Aortic Atherosclerosis (ICD10-I70.0) and Emphysema (ICD10-J43.9).   02/18/2018 PET scan   IMPRESSION: 1. Hypermetabolic apical nodule in the superior segment left lower lobe is highly worrisome for bronchogenic carcinoma. 2. No additional areas of abnormal hypermetabolism in the neck, chest, abdomen or pelvis. 3. Aortic atherosclerosis (ICD10-170.0). Coronary artery calcification. 4.  Emphysema (ICD10-J43.9). 5. Left renal stone.     03/06/2018 Pathology Results   Lung Biopsy  Diagnosis Lung, needle/core biopsy(ies), Left Lower Lobe - ADENOCARCINOMA. Microscopic Comment There is likely sufficient tissue for additional studies if  requested (block 1B). Dr. Lyndon Code has reviewed the case.   04/08/2018 Surgery   Left Video assisted Thoracoscopy by Dr Roxan Hockey  Diagnosis Lung, wedge biopsy/resection, Left Lower Lobe - INVASIVE ADENOCARCINOMA, MODERATELY DIFFERENTIATED, SPANNING 1.3 CM. - ADENOCARCINOMA INVOLVES VISCERAL PLEURA. - ONE BENIGN INTRAPULMONARY LYMPH NODE (0/1). - LYMPHOVASCULAR INVASION IS IDENTIFIED, FOCAL. - THE SURGICAL RESECTION MARGINS ARE NEGATIVE FOR CARCINOMA. - SEE ONCOLOGY TABLE BELOW.    08/01/2020 Imaging   CT Chest  IMPRESSION: Stable postop changes from prior left upper lobectomy. Stable small bilateral pulmonary nodules. No new or progressive disease within the thorax.   Aortic Atherosclerosis (ICD10-I70.0) and Emphysema (ICD10-J43.9).   Malignant neoplasm of upper-inner quadrant of left breast in female, estrogen receptor negative ([place])  10/18/2020 Mammogram   M[facility] 10/18/20 at [facility] FINDINGS:  Cc and MLO views of bilateral breasts are submitted. There is a  spiculated mass in the palpable area upper left breast. The right  breast is negative.   Targeted ultrasound is performed, showing 1.2 x 0.9 x 1.2 cm  spiculated hypoechoic mass at the left breast 11 o'clock 8 cm from  nipple palpable area. This correlates to the mammographic mass.  Ultrasound of the left axilla is negative.   IMPRESSION:  Highly suspicious findings.     10/26/2020 Initial Biopsy   Final Pathologic Diagnosis  10/26/20 at [facility] BREAST, LEFT 1:00 8 CM FROM NIPPLE, NEEDLE BIOPSY:              INVASIVE DUCTAL CARCINOMA, NOTTINGHAM GRADE 3. Comment   The Nottingham grade is 3 (3-tubule formation, 3-nuclear atypia, 3-mitoses).  This case was  reviewed by Dr. Vicenta Dunning and she is in essential agreement with the above diagnosis.  Final Pathologic Diagnosis   Prognostic Markers in Cancer   Block Number:  [id number] Diagnosis:  Invasive ductal carcinoma     Results:   Estrogen Receptor:  Negative  (<1%)   Progesterone Receptor:  Negative (<1%)   Her2:  Negative (score=1+)     Ki-67:  Percentage of tumor cells with nuclear positivity: 60 %      11/01/2020 Cancer Staging   Staging form: Breast, AJCC 8th Edition - Clinical stage from 11/01/2020: Stage IB (cT1c, cN0, cM0, G3, ER-, PR-, HER2-) - Signed by Truitt Merle, MD on 11/08/2020  Stage prefix: Initial diagnosis    11/08/2020 Initial Diagnosis   Malignant neoplasm of upper-inner quadrant of left breast in female, estrogen receptor negative ([place])   11/21/2020 Surgery   LEFT BREAST LUMPECTOMY WITH RADIOACTIVE SEED AND LEFT SENTINEL LYMPH NODE [place] and PAC placement by Dr Brantley Stage    11/21/2020 Pathology Results   FINAL MICROSCOPIC DIAGNOSIS:   A. BREAST, LEFT, LUMPECTOMY:  - Invasive ductal carcinoma, grade 3, 1.3 cm  - The anterior resection margin is widely involved by carcinoma  - Carcinoma is 0.2 cm from posterior margin  - Biopsy site changes  - See oncology table   B. BREAST, LEFT ADDITIONAL SUPERIOR MARGIN, EXCISION:  - Unremarkable fibroadipose tissue, negative for carcinoma   C. BREAST, LEFT ADDITIONAL LATERAL MARGIN, EXCISION:  - Unremarkable fibroadipose tissue, negative for carcinoma   D. BREAST, LEFT ADDITIONAL INFEROMEDIAL MARGIN, EXCISION:  - Unremarkable fibroadipose tissue, negative for carcinoma   E. LYMPH NODE, LEFT AXILLARY, SENTINEL, EXCISION:  - Lymph node, negative for carcinoma (0/1)    11/21/2020 Cancer Staging   Staging form: Breast, AJCC 8th Edition - Pathologic stage from 11/21/2020: Stage IB (pT1c, pN0, cM0, G3, ER-, PR-, HER2-) - Signed by [name], NP on 12/20/2020  Stage prefix: Initial diagnosis  Histologic grading system: 3 grade system    Metastatic lung cancer (metastasis from lung to other site), left ([place])  12/11/2020 Imaging   CT Chest  IMPRESSION: 1. Signs of tumor recurrence within the apical portion of the left hemithorax with large rind of increased soft tissue.  Tumor extends into the left upper thoracic spine paravertebral soft tissues. 2. Pleural spread of disease is noted with mild progressive pleural thickening overlying the posterior and lateral left lower lung. 3. Interval development of increased interstitial markings within the left upper lobe adjacent to mass. In the absence of interval external beam radiation (since 08/01/2020) this is suspicious for lymphangitic spread of tumor. 4. Progressive soft tissue mass the within the paravertebral right upper lobe concerning for metastatic disease. 5. New enlarged anterior mediastinal and left supraclavicular lymph nodes compatible with metastatic adenopathy. 6. Indeterminate soft tissue attenuating nodular density between the upper pole of left kidney and left adrenal gland. Cannot exclude nodal metastasis or adrenal gland metastasis. 7. Aortic atherosclerosis.   Aortic Atherosclerosis (ICD10-I70.0).   12/22/2020 Pathology Results   A. LYMPH NODE, LEFT NECK, NEEDLE CORE BIOPSY:  - Metastatic adenocarcinoma.   COMMENT:   CK7 is positive.  CK20, TTF-1, Napsin-A, CDX-2, PAX 8, GATA-3 and ER are  negative.  The limited CK7 positive staining is compatible with origin  from lung; however, it does not exclude origin from other entities. If  applicable, there is likely sufficient tissue for ancillary studies.  Dr. Saralyn Pilar reviewed the case.    12/27/2020 Imaging   PET scan IMPRESSION:  1. Large hypermetabolic mass occupying the left lung apex consistent with recurrent neoplasm. 2. Extensive metastatic disease involving the chest, abdomen, pelvis, bony structures, muscles and subcutaneous tissues.   01/07/2021 Initial Diagnosis   Metastatic lung cancer (metastasis from lung to other site), left ([place])   01/08/2021 -  Chemotherapy   First-line Carboplatin and Taxol with Bevacuzimab q3weeks starting 01/08/21. Due to significant fatigue and weight loss, reduced chemo to 2 weeks on/1 week off  treatment starting with C2 on 01/29/21.  --Added Tecentriq from C3D1 (02/22/21).    01/30/2021 Imaging   MRI Brain  IMPRESSION: 1. No evidence of intracranial metastatic disease. 2. A 7 mm rim enhancing lesion in the right parietal bone may represent bone metastasis. 3. A 2.4 heterogeneously enhancing lesion within the deep lobe of the left parotid gland may represent metastasis versus primary parotid neoplasm. 4. Diffuse decrease of the T1 signal within the visualized spine may represent red marrow reconversion. However, marrow replacement pathologies cannot be entirely excluded.     01/30/2021 Imaging   Brain MRI  IMPRESSION: 1. No evidence of intracranial metastatic disease. 2. A 7 mm rim enhancing lesion in the right parietal bone may represent bone metastasis. 3. A 2.4 heterogeneously enhancing lesion within the deep lobe of the left parotid gland may represent metastasis versus primary parotid neoplasm. 4. Diffuse decrease of the T1 signal within the visualized spine may represent red marrow reconversion. However, marrow replacement pathologies cannot be entirely excluded   02/19/2021 -  Chemotherapy   Added Tecentriq q3weeks on 02/19/21 (In addition to CT from C3)     CURRENT THERAPY:  --First-line Carboplatin and Taxol with Bevacuzimab q3weeks starting 01/08/21. Due to significant fatigue and weight loss, reduced chemo to 2 weeks on/1 week off treatment starting with C2 on 01/29/21.  --Added Tecentriq q3weeks on 02/19/21 (C3 CT)  INTERVAL HISTORY: Ms. Fodera returns for follow up and treatment as scheduled. She was last seen 02/26/21 with cycle 3. She is doing ok.  Able to stand in the kitchen to prepare some food but not doing other housework.  Spent some time sitting outside.  Eating a little better, takes prophylactic antiemetics. Her dentist adjusted her bite recently, no extractions or invasive procedures. Bowels are "slow," last BM 6/24.  Mag citrate helps the best out of all  other agents.  Her scapular pain is 5/10, stable on chemo but finds oxycodone does not last as long anymore, takes 4/day.  She is taking more Tylenol for breakthrough.  She has a sore on the left elbow with a whitehead, and noticed 2 new knots under her left and right upper arms. Lump on her left supraclavicular  is a little flatter.  Denies new pain, neuropathy, fever, chills, cough, chest pain, dyspnea.    MEDICAL HISTORY:  Past Medical History:  Diagnosis Date   Breast cancer ([place])    Cancer ([place])    Stage IA non-small cell lung cancer, left upper lobectomy 12/2011   COPD (chronic obstructive pulmonary disease) ([facility])    Cough 07/29/11   started coughing up blood this am   Cough    Hypertension    Kyphoscoliosis    Pneumonia    PONV (postoperative nausea and vomiting)    Recurrent upper respiratory infection ([facility])     SURGICAL HISTORY: Past Surgical History:  Procedure Laterality Date   BREAST LUMPECTOMY WITH RADIOACTIVE SEED AND SENTINEL LYMPH NODE BIOPSY Left 11/21/2020   Procedure: LEFT BREAST LUMPECTOMY WITH RADIOACTIVE SEED AND LEFT SENTINEL LYMPH  NODE [place];  Surgeon: Erroll Luna, MD;  Location: [place];  Service: General;  Laterality: Left;  PEC BLOCK; START TIME OF 11:00 AM FOR 90 MINUTES IN ROOM 2   BRONCHOSCOPY     CATARACT EXTRACTION, BILATERAL  2016   EYE SURGERY     INSERTION OF IBV VALVE Left 04/16/2018   Procedure: INSERTION OF INTERBRONCHIAL VALVE (IBV);  Surgeon: [name], MD;  Location: [place];  Service: Thoracic;  Laterality: Left;   INSERTION OF IBV VALVE N/A 06/25/2018   Procedure: REMOVAL OF THREE INTERBRONCHIAL VALVE (IBV);  Surgeon: [name], MD;  Location: [facility] OR;  Service: Thoracic;  Laterality: N/A;   LOBECTOMY  12/17/2011   Procedure: LOBECTOMY;  Surgeon: [name], MD;  Location: [place];  Service: Thoracic;  Laterality: Left;  (L)VATS, WEDGE RESECTION, LEFT UPPER LOBECTOMY,  Multiple node biopsies   PORTACATH PLACEMENT  Left 11/21/2020   Procedure: INSERTION PORT-A-CATH;  Surgeon: Erroll Luna, MD;  Location: [place];  Service: General;  Laterality: Left;   REMOVAL OF PLEURAL DRAINAGE CATHETER Left 05/06/2018   Procedure: REMOVAL OF CHEST TUBE;  Surgeon: [name], MD;  Location: [place];  Service: Thoracic;  Laterality: Left;   THYROID SURGERY  1980   1/2 thyroid removed - benign   URETHRA SURGERY     VIDEO ASSISTED THORACOSCOPY Left 04/08/2018   Procedure: REDO LEFT VIDEO ASSISTED THORACOSCOPY with left lower lobe wedge resection;  Surgeon: [name], MD;  Location: [facility] OR;  Service: Thoracic;  Laterality: Left;   VIDEO BRONCHOSCOPY N/A 04/16/2018   Procedure: VIDEO BRONCHOSCOPY;  Surgeon: [name], MD;  Location: [place];  Service: Thoracic;  Laterality: N/A;   VIDEO BRONCHOSCOPY N/A 06/25/2018   Procedure: VIDEO BRONCHOSCOPY;  Surgeon: [name], MD;  Location: [place];  Service: Thoracic;  Laterality: N/A;    I have reviewed the social history and family history with the patient and they are unchanged from previous note.  ALLERGIES:  has No Known Allergies.  MEDICATIONS:  Current Outpatient Medications  Medication Sig Dispense Refill   acetaminophen (TYLENOL) 500 MG tablet Take 1,000 mg by mouth every 6 (six) hours as needed for moderate pain or headache.     acyclovir ointment (ZOVIRAX) 5 % Apply 1 application topically every 3 (three) hours as needed. 15 g 0   albuterol (VENTOLIN HFA) 108 (90 Base) MCG/ACT inhaler Inhale 2 puffs into the lungs every 4 (four) hours as needed for wheezing or shortness of breath. For shortness of breath 8.51 g 5   BEVESPI AEROSPHERE 9-4.8 MCG/ACT AERO INHALE 2 PUFFS INTO THE LUNGS 2 TIMES DAILY. (Patient taking differently: Inhale 2 puffs into the lungs as needed (wheezing, shortness of breath).) 10.7 g 11   dexamethasone (DECADRON) 4 MG tablet Take 2 tablets (8 mg total) by mouth daily. Start the day after carboplatin chemotherapy  for 3 days. (Patient taking differently: Take 4 mg by mouth See admin instructions. 4 mg 2 days after chemo.) 30 tablet 1   lidocaine-prilocaine (EMLA) cream Apply to affected area once (Patient taking differently: Apply 1 application topically See admin instructions. Apply to affected area or port before/after treatment.) 30 g 3   montelukast (SINGULAIR) 10 MG tablet Take 1 tablet (10 mg total) by mouth at bedtime. 30 tablet 5   ondansetron (ZOFRAN) 8 MG tablet Take 1 tablet (8 mg total) by mouth 2 (two) times daily as needed for refractory nausea / vomiting. Start on day 3  after carboplatin chemo. 30 tablet 1   oxyCODONE (OXY IR/ROXICODONE) 5 MG immediate release tablet Take 1 tablet (5 mg total) by mouth every 4 (four) hours as needed for severe pain. 90 tablet 0   prochlorperazine (COMPAZINE) 10 MG tablet Take 1 tablet (10 mg total) by mouth every 6 (six) hours as needed (Nausea or vomiting). 30 tablet 1   No current facility-administered medications for this visit.   Facility-Administered Medications Ordered in Other Visits  Medication Dose Route Frequency Provider Last Rate Last Admin   0.9 %  sodium chloride infusion   Intravenous Once Truitt Merle, MD       CARBOplatin (PARAPLATIN) 90 mg in sodium chloride 0.9 % 100 mL chemo infusion  90 mg Intravenous Once Truitt Merle, MD 218 mL/hr at 03/12/21 1335 90 mg at 03/12/21 1335   heparin lock flush 100 unit/mL  500 Units Intracatheter Once PRN Truitt Merle, MD       heparin lock flush 100 unit/mL  500 Units Intracatheter Once PRN Truitt Merle, MD       sodium chloride flush (NS) 0.9 % injection 10 mL  10 mL Intracatheter PRN Truitt Merle, MD       sodium chloride flush (NS) 0.9 % injection 10 mL  10 mL Intracatheter PRN Truitt Merle, MD        PHYSICAL EXAMINATION: ECOG PERFORMANCE STATUS: 2 - Symptomatic, <50% confined to bed See infusion flow sheet for vitals There were no vitals filed for this visit. There were no vitals filed for this  visit.  GENERAL:alert, no distress and comfortable SKIN: Pustule to left elbow. 1 cm soft tissue nodule to b/l upper arm, close to axillas  EYES: sclera clear LYMPH:  1-1.5 cm left supraclavicular LN with erythema, not invading skin but close to surface  LUNGS: normal breathing effort HEART: no lower extremity edema ABDOMEN:abdomen soft, non-tender and normal bowel sounds NEURO: alert & oriented x 3 with fluent speech, exam performed in recliner  PAC without erythema   LABORATORY DATA:  I have reviewed the data as listed CBC Latest Ref Rng & Units 03/12/2021 02/26/2021 02/19/2021  WBC 4.0 - 10.5 K/uL 6.8 5.1 6.1  Hemoglobin 12.0 - 15.0 g/dL 12.1 12.4 12.4  Hematocrit 36.0 - 46.0 % 35.6(L) 36.5 35.8(L)  Platelets 150 - 400 K/uL 416(H) 284 306     CMP Latest Ref Rng & Units 03/12/2021 02/26/2021 02/19/2021  Glucose 70 - 99 mg/dL 83 82 96  BUN 8 - 23 mg/dL 11 14 9   Creatinine 0.44 - 1.00 mg/dL 0.52 0.53 0.54  Sodium 135 - 145 mmol/L 136 135 136  Potassium 3.5 - 5.1 mmol/L 4.2 3.5 3.5  Chloride 98 - 111 mmol/L 101 96(L) 100  CO2 22 - 32 mmol/L 24 25 24   Calcium 8.9 - 10.3 mg/dL 8.8(L) 9.6 8.9  Total Protein 6.5 - 8.1 g/dL 6.7 6.6 6.3(L)  Total Bilirubin 0.3 - 1.2 mg/dL 0.5 0.5 0.5  Alkaline Phos 38 - 126 U/L 377(H) 119 110  AST 15 - 41 U/L 48(H) 28 16  ALT 0 - 44 U/L 49(H) 15 12      RADIOGRAPHIC STUDIES: I have personally reviewed the radiological images as listed and agreed with the findings in the report. No results found.   ASSESSMENT & PLAN: AUDRINNA SHERMAN is a 70 y.o. female with     1. Metastatic Lung Adenocarcinoma, stage IV  -Chest CT 12/11/20 showed a large soft tissue mass in the left apical portion of  the lung, with direct invasion of the left upper thoracic spine, probable pleural metastasis -Left supraclavicular lymph node biopsy on 12/22/20 confirmed metastatic adenocarcinoma -PET scan 12/27/20 showed: large hypermetabolic mass occupying left lung apex consistent  with recurrent neoplasm; extensive metastatic disease involving chest, abdomen, pelvis, bony structures, muscles, and subcutaneous tissues. -Her FO and PD-L1 test result from [facility] node biopsy is still pending.  -She began first line chemo with Carboplatin and Taxol with Bevacizumab q3weeks on 01/09/21, which will cover both lung and breast cancer if she has two primaries. Goal of therapy is palliative, to control disease and prolong her life. Plan to restage 3 months into treatment -s/p cycle 1 she had moderate fatigue, low appetite, weight loss, and headaches. With cycle 2 she was changed to 2 weeks on/1 week off starting 5/16. Tolerated better -I reviewed brain MRI which is negative for intracranial mets, but shows a 7 mm right parietal skull met and possible left parotid met vs possible primary neoplasm. -brain MRI was reviewed in H/N tumor board, this is felt to be c/w metastatic lung cancer S/p C2D1 carbo/taxol and beva she had an episode of SVT and required ED visit, she was treated and had no recurrent episodes. She otherwise tolerated treatment moderately well with low appetite, no further weight loss, fatigue, and nausea. -FO still pending. Atezolizumab added with cycle 3 taxol/carbo/beva starting today 02/19/21 -s/p 3 cycles carbo/taxol and beva   2. Pain in bilateral scapula secondary to #1 -She has pain to her left scapula, rated 5-6/10 on 12/28/20, at location of metastatic tumor -Dr. Isidore Moos discussed with patient in 11/3020 to hold on palliative Radiation for now. -She had to increase her Oxycodone 81m to q4hours and takes 5-6 tabs daily. For better control, Dr. FBurr Medicostarted long acting MS Contin 159mBID. She developed nausea and only took for a few days then stopped -S/p cycle 2 day 1, right scapular pain has improved significantly, left scapular pain is stable. She is not on MS contin but has been able to reduce oxycodone to 3-4 tabs per day since starting treatment, suggesting a clinical  response to chemo -She is still on Zoloft. She has been advised to not take her sedative medications at the same time. -headaches are intermittent and stable, not mentioned today    3. Weight loss, Fatigue, Secondary to chemo -She has taste change and low appetite from chemo. She has lost more weight lately. -Referred to Dietician for management -chemo reduced to weekly treatment 2 weeks on/1 week off starting with C2, tolerated better -she had no improvement in her appetite after taking mirtazapine for 1 week, so she stopped. She restarted briefly but has no improvement and stopped -dietician visits with infusion, weight is low but stable lately    4. 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 Her 10/2020 mammogram showed a 1.2cm mass in the 11:00 position of her left breast. 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 The anterior resection margin was positive. -given her PET scan findings of multiple subcutaneous and intramuscular metastasis, Dr. FeBurr Medicouspects this is lung met   -current chemo for her lung met is also effective regimen for triple  negative cancer -Due to her metastatic lung cancer, will hold on adjuvant radiation for now.   5. History of LUL stage IA, T1 a, N0, M0) non-small cell lung cancer, adenocarcinoma Dx in 2013 + LLL stage Ib (T2, and 0, M0) non-small cell lung cancer, adenocarcinoma Dx 02/2018.   6. COPD and HTN  -She quit smoking in 2013 after she was diagnosed with lung cancer. -She has SOB and uses inhaler daily. Not on oxygen.   -stable   7. SVT -following C2D1 carbo/taxol/beva on 5/16 she  developed dizziness, SOB, and tachycardia at home on 5/21, called EMS -she was found to be in SVT with HR 210, normalized with adenosine x1 in the field -she was brought to [facility] ED, labs showed elevated troponins, dehydration. No UTI -she was given IVF, IV fentanyl. Normalized and she was discharged home, no recurrent episodes. -seen by cardiology Dr. Cristopher Peru   Disposition:  Ms. Pomerleau appears stable. She has completed 3 cycles of carbo/taxol and bevacizumab. Tecentriq added with C3. She tolerates treatment mostly well with mild nausea, constipation, weight fluctuation, and low activity. Side effects are well managed with supportive care at home. She is able to recover and function well.   She has 2 new subcutaneous nodules on her bilateral upper arms, near axilla. Scapular pain is stable. Oxycodone not lasting as long and she started taking tylenol for breakthrough. LFTs slightly elevated. We discussed possible etiologies including tylenol overuse vs chemo (taxol) vs disease progression. She will limit tylenol. I am referring her for restaging PET scan after this cycle 4.   Labs reviewed, otherwise stable and adequate for C4 D1 carbo/taxol, mvasi, and tecentriq today as planned. She will return for f/up and C4 D8 carbo/taxol on 7/6, then off for a week.    Orders Placed This Encounter  Procedures   NM PET Image Restag (PS) Skull Base To Thigh    Standing Status:   Future    Standing Expiration Date:   03/12/2022    Order Specific Question:   If indicated for the ordered procedure, I authorize the administration of a radiopharmaceutical per Radiology protocol    Answer:   Yes    Order Specific Question:   Preferred imaging location?    Answer:   [facility]    All questions were answered. The patient knows to call the clinic with any problems, questions or concerns. No barriers to learning were detected.     Alla Feeling, NP 03/12/21

## 2021-03-12 ENCOUNTER — Other Ambulatory Visit: Payer: Self-pay

## 2021-03-12 ENCOUNTER — Encounter: Payer: Self-pay | Admitting: Nurse Practitioner

## 2021-03-12 ENCOUNTER — Inpatient Hospital Stay: Payer: Medicare Other

## 2021-03-12 ENCOUNTER — Ambulatory Visit: Payer: Medicare Other | Admitting: Nutrition

## 2021-03-12 ENCOUNTER — Inpatient Hospital Stay (HOSPITAL_BASED_OUTPATIENT_CLINIC_OR_DEPARTMENT_OTHER): Payer: Medicare Other | Admitting: Nurse Practitioner

## 2021-03-12 VITALS — BP 131/80 | HR 99 | Temp 98.2°F | Resp 18 | Wt 95.5 lb

## 2021-03-12 DIAGNOSIS — Z5112 Encounter for antineoplastic immunotherapy: Secondary | ICD-10-CM | POA: Diagnosis not present

## 2021-03-12 DIAGNOSIS — C3490 Malignant neoplasm of unspecified part of unspecified bronchus or lung: Secondary | ICD-10-CM

## 2021-03-12 DIAGNOSIS — C3492 Malignant neoplasm of unspecified part of left bronchus or lung: Secondary | ICD-10-CM | POA: Diagnosis not present

## 2021-03-12 DIAGNOSIS — C3432 Malignant neoplasm of lower lobe, left bronchus or lung: Secondary | ICD-10-CM | POA: Diagnosis not present

## 2021-03-12 DIAGNOSIS — C782 Secondary malignant neoplasm of pleura: Secondary | ICD-10-CM | POA: Diagnosis not present

## 2021-03-12 DIAGNOSIS — Z95828 Presence of other vascular implants and grafts: Secondary | ICD-10-CM

## 2021-03-12 DIAGNOSIS — Z5111 Encounter for antineoplastic chemotherapy: Secondary | ICD-10-CM | POA: Diagnosis not present

## 2021-03-12 DIAGNOSIS — C7989 Secondary malignant neoplasm of other specified sites: Secondary | ICD-10-CM

## 2021-03-12 DIAGNOSIS — C50212 Malignant neoplasm of upper-inner quadrant of left female breast: Secondary | ICD-10-CM | POA: Diagnosis not present

## 2021-03-12 DIAGNOSIS — C770 Secondary and unspecified malignant neoplasm of lymph nodes of head, face and neck: Secondary | ICD-10-CM | POA: Diagnosis not present

## 2021-03-12 LAB — CBC WITH DIFFERENTIAL (CANCER CENTER ONLY)
Abs Immature Granulocytes: 0.02 10*3/uL (ref 0.00–0.07)
Basophils Absolute: 0.1 10*3/uL (ref 0.0–0.1)
Basophils Relative: 1 %
Eosinophils Absolute: 0.1 10*3/uL (ref 0.0–0.5)
Eosinophils Relative: 1 %
HCT: 35.6 % — ABNORMAL LOW (ref 36.0–46.0)
Hemoglobin: 12.1 g/dL (ref 12.0–15.0)
Immature Granulocytes: 0 %
Lymphocytes Relative: 16 %
Lymphs Abs: 1.1 10*3/uL (ref 0.7–4.0)
MCH: 30.4 pg (ref 26.0–34.0)
MCHC: 34 g/dL (ref 30.0–36.0)
MCV: 89.4 fL (ref 80.0–100.0)
Monocytes Absolute: 0.6 10*3/uL (ref 0.1–1.0)
Monocytes Relative: 9 %
Neutro Abs: 4.9 10*3/uL (ref 1.7–7.7)
Neutrophils Relative %: 73 %
Platelet Count: 416 10*3/uL — ABNORMAL HIGH (ref 150–400)
RBC: 3.98 MIL/uL (ref 3.87–5.11)
RDW: 19.8 % — ABNORMAL HIGH (ref 11.5–15.5)
WBC Count: 6.8 10*3/uL (ref 4.0–10.5)
nRBC: 0 % (ref 0.0–0.2)

## 2021-03-12 LAB — TSH: TSH: 1.656 u[IU]/mL (ref 0.308–3.960)

## 2021-03-12 LAB — CMP (CANCER CENTER ONLY)
ALT: 49 U/L — ABNORMAL HIGH (ref 0–44)
AST: 48 U/L — ABNORMAL HIGH (ref 15–41)
Albumin: 2.9 g/dL — ABNORMAL LOW (ref 3.5–5.0)
Alkaline Phosphatase: 377 U/L — ABNORMAL HIGH (ref 38–126)
Anion gap: 11 (ref 5–15)
BUN: 11 mg/dL (ref 8–23)
CO2: 24 mmol/L (ref 22–32)
Calcium: 8.8 mg/dL — ABNORMAL LOW (ref 8.9–10.3)
Chloride: 101 mmol/L (ref 98–111)
Creatinine: 0.52 mg/dL (ref 0.44–1.00)
GFR, Estimated: >60 mL/min (ref >60)
Glucose, Bld: 83 mg/dL (ref 70–99)
Potassium: 4.2 mmol/L (ref 3.5–5.1)
Sodium: 136 mmol/L (ref 135–145)
Total Bilirubin: 0.5 mg/dL (ref 0.3–1.2)
Total Protein: 6.7 g/dL (ref 6.5–8.1)

## 2021-03-12 LAB — TOTAL PROTEIN, URINE DIPSTICK: Protein, ur: NEGATIVE mg/dL

## 2021-03-12 MED ORDER — PROCHLORPERAZINE MALEATE 10 MG PO TABS: 10.0000 mg | ORAL_TABLET | Freq: Four times a day (QID) | ORAL | 1 refills | Status: AC | PRN

## 2021-03-12 MED ORDER — PALONOSETRON HCL INJECTION 0.25 MG/5ML
INTRAVENOUS | Status: AC
  Filled 2021-03-12: qty 5

## 2021-03-12 MED ORDER — SODIUM CHLORIDE 0.9 % IV SOLN
150.0000 mg | Freq: Once | INTRAVENOUS | Status: AC
  Administered 2021-03-12: 150 mg via INTRAVENOUS
  Filled 2021-03-12: qty 150

## 2021-03-12 MED ORDER — SODIUM CHLORIDE 0.9 % IV SOLN: 80.0000 mg/m2 | Freq: Once | INTRAVENOUS | Status: DC

## 2021-03-12 MED ORDER — FAMOTIDINE 20 MG IN NS 100 ML IVPB
INTRAVENOUS | Status: AC
  Filled 2021-03-12: qty 100

## 2021-03-12 MED ORDER — DIPHENHYDRAMINE HCL 50 MG/ML IJ SOLN
50.0000 mg | Freq: Once | INTRAMUSCULAR | Status: AC
  Administered 2021-03-12: 50 mg via INTRAVENOUS

## 2021-03-12 MED ORDER — SODIUM CHLORIDE 0.9 % IV SOLN
15.0000 mg/kg | Freq: Once | INTRAVENOUS | Status: AC
  Administered 2021-03-12: 700 mg via INTRAVENOUS
  Filled 2021-03-12: qty 16

## 2021-03-12 MED ORDER — HEPARIN SOD (PORK) LOCK FLUSH 100 UNIT/ML IV SOLN
500.0000 [IU] | Freq: Once | INTRAVENOUS | Status: DC | PRN
  Filled 2021-03-12: qty 5

## 2021-03-12 MED ORDER — HEPARIN SOD (PORK) LOCK FLUSH 100 UNIT/ML IV SOLN
500.0000 [IU] | Freq: Once | INTRAVENOUS | Status: AC | PRN
Start: 2021-03-12 — End: 2021-03-12
  Administered 2021-03-12: 500 [IU]
  Filled 2021-03-12: qty 5

## 2021-03-12 MED ORDER — SODIUM CHLORIDE 0.9 % IV SOLN
80.0000 mg/m2 | Freq: Once | INTRAVENOUS | Status: AC
  Administered 2021-03-12: 114 mg via INTRAVENOUS
  Filled 2021-03-12: qty 19

## 2021-03-12 MED ORDER — SODIUM CHLORIDE 0.9 % IV SOLN
1200.0000 mg | Freq: Once | INTRAVENOUS | Status: AC
  Administered 2021-03-12: 1200 mg via INTRAVENOUS
  Filled 2021-03-12: qty 20

## 2021-03-12 MED ORDER — PALONOSETRON HCL INJECTION 0.25 MG/5ML
0.2500 mg | Freq: Once | INTRAVENOUS | Status: AC
  Administered 2021-03-12: 0.25 mg via INTRAVENOUS

## 2021-03-12 MED ORDER — SODIUM CHLORIDE 0.9 % IV SOLN
93.7500 mg | Freq: Once | INTRAVENOUS | Status: AC
  Administered 2021-03-12: 90 mg via INTRAVENOUS
  Filled 2021-03-12: qty 9

## 2021-03-12 MED ORDER — SODIUM CHLORIDE 0.9% FLUSH
10.0000 mL | Freq: Once | INTRAVENOUS | Status: AC
  Administered 2021-03-12: 10 mL
  Filled 2021-03-12: qty 10

## 2021-03-12 MED ORDER — SODIUM CHLORIDE 0.9 % IV SOLN
Freq: Once | INTRAVENOUS | Status: DC
  Filled 2021-03-12: qty 250

## 2021-03-12 MED ORDER — FAMOTIDINE 20 MG IN NS 100 ML IVPB
20.0000 mg | Freq: Once | INTRAVENOUS | Status: AC
  Administered 2021-03-12: 20 mg via INTRAVENOUS

## 2021-03-12 MED ORDER — SODIUM CHLORIDE 0.9% FLUSH
10.0000 mL | INTRAVENOUS | Status: DC | PRN
  Administered 2021-03-12: 10 mL
  Filled 2021-03-12: qty 10

## 2021-03-12 MED ORDER — DIPHENHYDRAMINE HCL 50 MG/ML IJ SOLN
INTRAMUSCULAR | Status: AC
  Filled 2021-03-12: qty 1

## 2021-03-12 MED ORDER — SODIUM CHLORIDE 0.9 % IV SOLN
10.0000 mg | Freq: Once | INTRAVENOUS | Status: AC
  Administered 2021-03-12: 10 mg via INTRAVENOUS
  Filled 2021-03-12: qty 10

## 2021-03-12 MED ORDER — SODIUM CHLORIDE 0.9 % IV SOLN
Freq: Once | INTRAVENOUS | Status: AC
  Filled 2021-03-12: qty 250

## 2021-03-12 MED ORDER — OXYCODONE HCL 5 MG PO TABS: 5.0000 mg | ORAL_TABLET | ORAL | 0 refills | Status: DC | PRN

## 2021-03-12 MED ORDER — ONDANSETRON HCL 8 MG PO TABS: 8.0000 mg | ORAL_TABLET | Freq: Two times a day (BID) | ORAL | 1 refills | Status: AC | PRN

## 2021-03-12 MED ORDER — SODIUM CHLORIDE 0.9% FLUSH
10.0000 mL | INTRAVENOUS | Status: DC | PRN
  Filled 2021-03-12: qty 10

## 2021-03-12 NOTE — Progress Notes (Signed)
Nutrition follow-up completed with patient during infusion for metastatic lung/breast cancer. Patient pleased because she has gained approximately 2 pounds since June 22. Weight today was documented as 95.5 pounds, decreased overall from 100.6 pounds May 16. Patient states she is getting tired of oral nutrition supplements. She usually drinks Ensure.  She has not tried boost but is willing.  Reports she eats ice cream 3 times a day.  States she and her husband went to a restaurant where she ordered a child-size plate of spaghetti.  She was able to eat all of it.  She also ordered 4 desserts to take home with her and she has been consuming these daily.  Reports variable bowel movements.  States she has been having constipation and then developed very loose stools after taking a full bottle of magnesium citrate.  Nutrition diagnosis: Severe malnutrition continues.  Intervention: Educated patient on strategies for making milkshakes out of oral nutrition supplements.  Also encouraged her to try a different variety of supplements to see if she like these better.  I made her a milkshake using ice cream and boost plus today which she said she enjoyed. Educated on strategies for bowel regiment. Support and encouragement provided.  Monitoring, evaluation, goals: Patient will tolerate adequate calories and protein to minimize weight loss.  Next visit: Monday, July 25 during infusion.  **Disclaimer: This note was dictated with voice recognition software. Similar sounding words can inadvertently be transcribed and this note may contain transcription errors which may not have been corrected upon publication of note.**

## 2021-03-12 NOTE — Patient Instructions (Addendum)
[facility]  Discharge Instructions: Thank you for choosing [facility] to provide your oncology and hematology care.   If you have a lab appointment with the [facility], please go directly to the [facility] and check in at the registration area.   Wear comfortable clothing and clothing appropriate for easy access to any Portacath or PICC line.   We strive to give you quality time with your provider. You may need to reschedule your appointment if you arrive late (15 or more minutes).  Arriving late affects you and other patients whose appointments are after yours.  Also, if you miss three or more appointments without notifying the office, you may be dismissed from the clinic at the provider's discretion.      For prescription refill requests, have your pharmacy contact our office and allow 72 hours for refills to be completed.    Today you received the following chemotherapy and/or immunotherapy agents: Mvasi; Tecentriq; Taxol; Carboplatin   To help prevent nausea and vomiting after your treatment, we encourage you to take your nausea medication as directed.  BELOW ARE SYMPTOMS THAT SHOULD BE REPORTED IMMEDIATELY: *FEVER GREATER THAN 100.4 F (38 C) OR HIGHER *CHILLS OR SWEATING *NAUSEA AND VOMITING THAT IS NOT CONTROLLED WITH YOUR NAUSEA MEDICATION *UNUSUAL SHORTNESS OF BREATH *UNUSUAL BRUISING OR BLEEDING *URINARY PROBLEMS (pain or burning when urinating, or frequent urination) *BOWEL PROBLEMS (unusual diarrhea, constipation, pain near the anus) TENDERNESS IN MOUTH AND THROAT WITH OR WITHOUT PRESENCE OF ULCERS (sore throat, sores in mouth, or a toothache) UNUSUAL RASH, SWELLING OR PAIN  UNUSUAL VAGINAL DISCHARGE OR ITCHING   Items with * indicate a potential emergency and should be followed up as soon as possible or go to the Emergency Department if any problems should occur.  Please show the CHEMOTHERAPY ALERT CARD or IMMUNOTHERAPY  ALERT CARD at check-in to the Emergency Department and triage nurse.  Should you have questions after your visit or need to cancel or reschedule your appointment, please contact [facility]  Dept: [phone]  and follow the prompts.  Office hours are 8:00 a.m. to 4:30 p.m. Monday - Friday. Please note that voicemails left after 4:00 p.m. may not be returned until the following business day.  We are closed weekends and major holidays. You have access to a nurse at all times for urgent questions. Please call the main number to the clinic Dept: [phone] and follow the prompts.   For any non-urgent questions, you may also contact your provider using MyChart. We now offer e-Visits for anyone 46 and older to request care online for non-urgent symptoms. For details visit [email or web address].   Also download the MyChart app! Go to the app store, search "MyChart", open the app, select [facility], and log in with your MyChart username and password.  Due to Covid, a mask is required upon entering the hospital/clinic. If you do not have a mask, one will be given to you upon arrival. For doctor visits, patients may have 1 support person aged 28 or older with them. For treatment visits, patients cannot have anyone with them due to current Covid guidelines and our immunocompromised population.

## 2021-03-16 ENCOUNTER — Other Ambulatory Visit: Payer: Self-pay | Admitting: Hematology

## 2021-03-16 DIAGNOSIS — Z171 Estrogen receptor negative status [ER-]: Secondary | ICD-10-CM

## 2021-03-16 DIAGNOSIS — C3490 Malignant neoplasm of unspecified part of unspecified bronchus or lung: Secondary | ICD-10-CM

## 2021-03-20 ENCOUNTER — Telehealth: Payer: Self-pay

## 2021-03-20 NOTE — Telephone Encounter (Signed)
-----   Message from Truitt Merle, MD sent at 03/06/2021  3:32 PM EDT ----- Please let pt know her x-ray result, no surgery needed, take pain meds if needed, thanks   Truitt Merle  03/06/2021

## 2021-03-20 NOTE — Progress Notes (Signed)
[facility] OFFICE PROGRESS NOTE  [name], [place] [address]  DIAGNOSIS: Follow up metastatic lung cancer   Oncology History Overview Note  Cancer Staging Malignant neoplasm of upper-inner quadrant of left breast in female, estrogen receptor negative ([place]) Staging form: Breast, AJCC 8th Edition - Clinical stage from 11/01/2020: Stage IB (cT1c, cN0, cM0, G3, ER-, PR-, HER2-) - Signed by Truitt Merle, MD on 11/08/2020 Stage prefix: Initial diagnosis - Pathologic stage from 11/21/2020: Stage IB (pT1c, pN0, cM0, G3, ER-, PR-, HER2-) - Signed by [name], NP on 12/20/2020 Stage prefix: Initial diagnosis Histologic grading system: 3 grade system  Non-small cell lung cancer ([place]) Staging form: Lung, AJCC 7th Edition - Clinical: Stage IA (Free text: Ia) - Signed by [name], MD on 12/28/2013 Laterality: Left Cancer stage: Ia - Pathologic: Ia - Signed by [name], MD on 12/28/2013 Laterality: Left Cancer stage: Ia    Non-small cell lung cancer ([place])  09/02/2011 Imaging   CT Chest  IMPRESSION:   1.  Interval clearing of right upper lobe pneumonia.  2.  Left upper lobe nodule is unchanged in the short interval from  07/29/2011.  Follow-up could be performed in 3 months to ensure  continued stability. This recommendation follows the consensus  statement: Guidelines for Management of Small Pulmonary Nodules  Detected on CT Scans:  A Statement from the [place] as  published in Radiology 2005; 237:395-400.  Available online at:  [email or web address]  3.  Additional scattered pulmonary nodules can be reevaluated on  future imaging as well.    11/06/2011 Imaging   PET IMPRESSION:   1.  Mild hypermetabolism which projects minimally cephalad to, but  is felt to correspond to the left upper lobe lung nodule.  Although  not within malignant range, given the  small size of the nodule,  malignancy cannot be excluded.  Possible mild interval enlargement  of the nodule since 09/02/2011.  Consider repeat standard chest CT  to confirm interval enlargement.  Especially if interval  enlargement has occurred, tissue sampling would be suggested.  Alternatively, 10-monthfollow-up chest CT could be performed.  2.  No evidence of thoracic nodal or extrathoracic disease.  3. Vague hypermetabolism corresponding to a prominent right lobe of  the thyroid. Nonspecific.  Consider ultrasound correlation.IMPRESSION:   1.  Mild hypermetabolism which projects minimally cephalad to, but  is felt to correspond to the left upper lobe lung nodule.  Although  not within malignant range, given the small size of the nodule,  malignancy cannot be excluded.  Possible mild interval enlargement  of the nodule since 09/02/2011.  Consider repeat standard chest CT  to confirm interval enlargement.  Especially if interval  enlargement has occurred, tissue sampling would be suggested.  Alternatively, 37-monthollow-up chest CT could be performed.  2.  No evidence of thoracic nodal or extrathoracic disease.  3. Vague hypermetabolism corresponding to a prominent right lobe of  the thyroid. Nonspecific.  Consider ultrasound correlation.   12/17/2011 Surgery   Thoracoscopy by Dr [name] Diagnosis 1. Lung, wedge biopsy/resection, left upper lobe - INVASIVE WELL-DIFFERENTIATED ADENOCARCINOMA, SPANNING 1.2 CM. - NO LYMPH VASCULAR INVASION IDENTIFIED. - PLEURA IS UNINVOLVED. - MARGINS ARE NEGATIVE. - SEE ONCOLOGY TEMPLATE. 2. Lymph node, biopsy, level 9 - ONE BENIGN LYMPH NODE WITH NO TUMOR SEEN (0/1). 3. Lymph node, biopsy, level 9 - ONE BENIGN LYMPH NODE WITH NO TUMOR SEEN (0/1). 4. Lymph  node, biopsy, 11 - ONE BENIGN LYMPH NODE WITH NO TUMOR SEEN (0/1). 5. Lymph node, biopsy, 11 #2 - ONE BENIGN LYMPH NODE WITH NO TUMOR SEEN (0/1). 6. Lymph node, biopsy, 5 - ONE BENIGN  LYMPH NODE WITH NO TUMOR SEEN (0/1). 7. Lymph node, biopsy, 10 - ONE BENIGN LYMPH NODE WITH NO TUMOR SEEN (0/1). 8. Lymph node, biopsy, 11 #3 - ONE BENIGN LYMPH NODE WITH NO TUMOR SEEN (0/1). 9. Lung, resection (segmental or lobe), Remainder of left upper lobe - BENIGN LUNG PARENCHYMA WITH CHRONIC INFLAMMATION AND INCREASED PULMONARY MACROPHAGES. - THREE BENIGN LYMPH NODES IDENTIFIED (0/3). - NO TUMOR SEEN.    01/08/2012 Initial Diagnosis   Non-small cell lung cancer ([place])   02/10/2018 Imaging   CT Chest IMPRESSION: 1. Enlarging irregular pleural-based solid 1.9 cm pulmonary nodule in the superior segment left lower lobe, suspicious for malignancy, either recurrent disease or a metachronous primary bronchogenic carcinoma. Consider PET-CT for further characterization. 2. Additional scattered bilateral pulmonary nodules are stable and considered benign. 3. No noncontrast CT evidence of thoracic adenopathy. 4. Three-vessel coronary atherosclerosis. 5. Nonobstructing left nephrolithiasis.   Aortic Atherosclerosis (ICD10-I70.0) and Emphysema (ICD10-J43.9).   02/18/2018 PET scan   IMPRESSION: 1. Hypermetabolic apical nodule in the superior segment left lower lobe is highly worrisome for bronchogenic carcinoma. 2. No additional areas of abnormal hypermetabolism in the neck, chest, abdomen or pelvis. 3. Aortic atherosclerosis (ICD10-170.0). Coronary artery calcification. 4.  Emphysema (ICD10-J43.9). 5. Left renal stone.     03/06/2018 Pathology Results   Lung Biopsy  Diagnosis Lung, needle/core biopsy(ies), Left Lower Lobe - ADENOCARCINOMA. Microscopic Comment There is likely sufficient tissue for additional studies if requested (block 1B). Dr. Lyndon Code has reviewed the case.   04/08/2018 Surgery   Left Video assisted Thoracoscopy by Dr Roxan Hockey  Diagnosis Lung, wedge biopsy/resection, Left Lower Lobe - INVASIVE ADENOCARCINOMA, MODERATELY DIFFERENTIATED, SPANNING 1.3 CM. -  ADENOCARCINOMA INVOLVES VISCERAL PLEURA. - ONE BENIGN INTRAPULMONARY LYMPH NODE (0/1). - LYMPHOVASCULAR INVASION IS IDENTIFIED, FOCAL. - THE SURGICAL RESECTION MARGINS ARE NEGATIVE FOR CARCINOMA. - SEE ONCOLOGY TABLE BELOW.    08/01/2020 Imaging   CT Chest  IMPRESSION: Stable postop changes from prior left upper lobectomy. Stable small bilateral pulmonary nodules. No new or progressive disease within the thorax.   Aortic Atherosclerosis (ICD10-I70.0) and Emphysema (ICD10-J43.9).   Malignant neoplasm of upper-inner quadrant of left breast in female, estrogen receptor negative ([place])  10/18/2020 Mammogram   Mammogram 10/18/20 at [facility] FINDINGS:  Cc and MLO views of bilateral breasts are submitted. There is a  spiculated mass in the palpable area upper left breast. The right  breast is negative.   Targeted ultrasound is performed, showing 1.2 x 0.9 x 1.2 cm  spiculated hypoechoic mass at the left breast 11 o'clock 8 cm from  nipple palpable area. This correlates to the mammographic mass.  Ultrasound of the left axilla is negative.   IMPRESSION:  Highly suspicious findings.     10/26/2020 Initial Biopsy   Final Pathologic Diagnosis  10/26/20 at [facility] BREAST, LEFT 1:00 8 CM FROM NIPPLE, NEEDLE BIOPSY:              INVASIVE DUCTAL CARCINOMA, NOTTINGHAM GRADE 3. Comment   The Nottingham grade is 3 (3-tubule formation, 3-nuclear atypia, 3-mitoses).  This case was reviewed by Dr. Vicenta Dunning and she is in essential agreement with the above diagnosis.  Final Pathologic Diagnosis   Prognostic Markers in Cancer   Block Number:  [id number] Diagnosis:  Invasive ductal carcinoma  Results:   Estrogen Receptor:  Negative (<1%)   Progesterone Receptor:  Negative (<1%)   Her2:  Negative (score=1+)     Ki-67:  Percentage of tumor cells with nuclear positivity: 60 %      11/01/2020 Cancer Staging   Staging form: Breast, AJCC 8th Edition - Clinical stage from 11/01/2020: Stage IB  (cT1c, cN0, cM0, G3, ER-, PR-, HER2-) - Signed by Truitt Merle, MD on 11/08/2020  Stage prefix: Initial diagnosis    11/08/2020 Initial Diagnosis   Malignant neoplasm of upper-inner quadrant of left breast in female, estrogen receptor negative ([place])   11/21/2020 Surgery   LEFT BREAST LUMPECTOMY WITH RADIOACTIVE SEED AND LEFT SENTINEL LYMPH NODE [place] and PAC placement by Dr Brantley Stage    11/21/2020 Pathology Results   FINAL MICROSCOPIC DIAGNOSIS:   A. BREAST, LEFT, LUMPECTOMY:  - Invasive ductal carcinoma, grade 3, 1.3 cm  - The anterior resection margin is widely involved by carcinoma  - Carcinoma is 0.2 cm from posterior margin  - Biopsy site changes  - See oncology table   B. BREAST, LEFT ADDITIONAL SUPERIOR MARGIN, EXCISION:  - Unremarkable fibroadipose tissue, negative for carcinoma   C. BREAST, LEFT ADDITIONAL LATERAL MARGIN, EXCISION:  - Unremarkable fibroadipose tissue, negative for carcinoma   D. BREAST, LEFT ADDITIONAL INFEROMEDIAL MARGIN, EXCISION:  - Unremarkable fibroadipose tissue, negative for carcinoma   E. LYMPH NODE, LEFT AXILLARY, SENTINEL, EXCISION:  - Lymph node, negative for carcinoma (0/1)    11/21/2020 Cancer Staging   Staging form: Breast, AJCC 8th Edition - Pathologic stage from 11/21/2020: Stage IB (pT1c, pN0, cM0, G3, ER-, PR-, HER2-) - Signed by [name], NP on 12/20/2020  Stage prefix: Initial diagnosis  Histologic grading system: 3 grade system    Metastatic lung cancer (metastasis from lung to other site), left ([place])  12/11/2020 Imaging   CT Chest  IMPRESSION: 1. Signs of tumor recurrence within the apical portion of the left hemithorax with large rind of increased soft tissue. Tumor extends into the left upper thoracic spine paravertebral soft tissues. 2. Pleural spread of disease is noted with mild progressive pleural thickening overlying the posterior and lateral left lower lung. 3. Interval development of increased interstitial  markings within the left upper lobe adjacent to mass. In the absence of interval external beam radiation (since 08/01/2020) this is suspicious for lymphangitic spread of tumor. 4. Progressive soft tissue mass the within the paravertebral right upper lobe concerning for metastatic disease. 5. New enlarged anterior mediastinal and left supraclavicular lymph nodes compatible with metastatic adenopathy. 6. Indeterminate soft tissue attenuating nodular density between the upper pole of left kidney and left adrenal gland. Cannot exclude nodal metastasis or adrenal gland metastasis. 7. Aortic atherosclerosis.   Aortic Atherosclerosis (ICD10-I70.0).   12/22/2020 Pathology Results   A. LYMPH NODE, LEFT NECK, NEEDLE CORE BIOPSY:  - Metastatic adenocarcinoma.   COMMENT:   CK7 is positive.  CK20, TTF-1, Napsin-A, CDX-2, PAX 8, GATA-3 and ER are  negative.  The limited CK7 positive staining is compatible with origin  from lung; however, it does not exclude origin from other entities. If  applicable, there is likely sufficient tissue for ancillary studies.  Dr. Saralyn Pilar reviewed the case.    12/27/2020 Imaging   PET scan IMPRESSION: 1. Large hypermetabolic mass occupying the left lung apex consistent with recurrent neoplasm. 2. Extensive metastatic disease involving the chest, abdomen, pelvis, bony structures, muscles and subcutaneous tissues.   01/07/2021 Initial Diagnosis   Metastatic lung cancer (metastasis from  lung to other site), left ([place])   01/08/2021 -  Chemotherapy   First-line Carboplatin and Taxol with Bevacuzimab q3weeks starting 01/08/21. Due to significant fatigue and weight loss, reduced chemo to 2 weeks on/1 week off treatment starting with C2 on 01/29/21.  --Added Tecentriq from C3D1 (02/22/21).    01/30/2021 Imaging   MRI Brain  IMPRESSION: 1. No evidence of intracranial metastatic disease. 2. A 7 mm rim enhancing lesion in the right parietal bone may represent bone  metastasis. 3. A 2.4 heterogeneously enhancing lesion within the deep lobe of the left parotid gland may represent metastasis versus primary parotid neoplasm. 4. Diffuse decrease of the T1 signal within the visualized spine may represent red marrow reconversion. However, marrow replacement pathologies cannot be entirely excluded.     01/30/2021 Imaging   Brain MRI  IMPRESSION: 1. No evidence of intracranial metastatic disease. 2. A 7 mm rim enhancing lesion in the right parietal bone may represent bone metastasis. 3. A 2.4 heterogeneously enhancing lesion within the deep lobe of the left parotid gland may represent metastasis versus primary parotid neoplasm. 4. Diffuse decrease of the T1 signal within the visualized spine may represent red marrow reconversion. However, marrow replacement pathologies cannot be entirely excluded   02/19/2021 -  Chemotherapy   Added Tecentriq q3weeks on 02/19/21 (In addition to CT from C3)     CURRENT THERAPY: carbo/taxol and bevacizumab day 1 and 8 IV every 3 weeks. Tecentriq added with C3.  INTERVAL HISTORY: Valerie Mosley 70 y.o. female returns to clinic today for follow-up visit prior to Cycle 4 and Day 8 with Carbo/Taxol.  The patient was last seen in the clinic on 03/12/2021.   Ms. Strzelecki continues to tolerate her treatment but reports chronic fatigue that has increased since the last visit. She rests frequently but can complete her basic ADLs on her own including self care. She tries to sit outside for a period of time. Her appetite is fair but stable. She tries to eat small, frequent meals throughout the day and supplement with one Ensure per day. She is followed by a member of the nutritionist team. She has lost an additional three pounds from her last visit. She denies any nausea, vomiting or abdominal pain. She has bilateral rib pain that is managed with oxycodone 5 mg q 3 hours. She reports constipation with a bowel movement every 3 days. She  currently takes miralax once daily. She denies signs of bleeding including hematochezia or melena. Patient has chronic productive cough and stable shortness of breath with exertion in the setting of COPD. Patient has intermittent episodes of headaches. Patient denies any fevers, chills, night sweats, chest pain, rash or peripheral edema. She has no other complaints.    Marland Kitchen  MEDICAL HISTORY: Past Medical History:  Diagnosis Date   Breast cancer ([place])    Cancer ([place])    Stage IA non-small cell lung cancer, left upper lobectomy 12/2011   COPD (chronic obstructive pulmonary disease) ([facility])    Cough 07/29/11   started coughing up blood this am   Cough    Hypertension    Kyphoscoliosis    Pneumonia    PONV (postoperative nausea and vomiting)    Recurrent upper respiratory infection (URI)     ALLERGIES:  has No Known Allergies.  MEDICATIONS:  Current Outpatient Medications  Medication Sig Dispense Refill   acetaminophen (TYLENOL) 500 MG tablet Take 1,000 mg by mouth every 6 (six) hours as needed for moderate pain or headache.  acyclovir ointment (ZOVIRAX) 5 % Apply 1 application topically every 3 (three) hours as needed. 15 g 0   albuterol (VENTOLIN HFA) 108 (90 Base) MCG/ACT inhaler Inhale 2 puffs into the lungs every 4 (four) hours as needed for wheezing or shortness of breath. For shortness of breath 8.51 g 5   BEVESPI AEROSPHERE 9-4.8 MCG/ACT AERO INHALE 2 PUFFS INTO THE LUNGS 2 TIMES DAILY. (Patient taking differently: Inhale 2 puffs into the lungs as needed (wheezing, shortness of breath).) 10.7 g 11   dexamethasone (DECADRON) 4 MG tablet Take 2 tablets (8 mg total) by mouth daily. Start the day after carboplatin chemotherapy for 3 days. (Patient taking differently: Take 4 mg by mouth See admin instructions. 4 mg 2 days after chemo.) 30 tablet 1   lidocaine-prilocaine (EMLA) cream Apply to affected area once (Patient taking differently: Apply 1 application topically See admin  instructions. Apply to affected area or port before/after treatment.) 30 g 3   montelukast (SINGULAIR) 10 MG tablet Take 1 tablet (10 mg total) by mouth at bedtime. 30 tablet 5   ondansetron (ZOFRAN) 8 MG tablet Take 1 tablet (8 mg total) by mouth 2 (two) times daily as needed for refractory nausea / vomiting. Start on day 3 after carboplatin chemo. 30 tablet 1   oxyCODONE (OXY IR/ROXICODONE) 5 MG immediate release tablet Take 1 tablet (5 mg total) by mouth every 4 (four) hours as needed for severe pain. 90 tablet 0   prochlorperazine (COMPAZINE) 10 MG tablet Take 1 tablet (10 mg total) by mouth every 6 (six) hours as needed (Nausea or vomiting). 30 tablet 1   No current facility-administered medications for this visit.   Facility-Administered Medications Ordered in Other Visits  Medication Dose Route Frequency Provider Last Rate Last Admin   CARBOplatin (PARAPLATIN) 90 mg in sodium chloride 0.9 % 100 mL chemo infusion  90 mg Intravenous Once Truitt Merle, MD       heparin lock flush 100 unit/mL  500 Units Intracatheter Once PRN Truitt Merle, MD       PACLitaxel (TAXOL) 114 mg in sodium chloride 0.9 % 250 mL chemo infusion (> 68m/m2)  80 mg/m2 (Order-Specific) Intravenous Once FTruitt Merle MD 269 mL/hr at 03/21/21 1121 114 mg at 03/21/21 1121   sodium chloride flush (NS) 0.9 % injection 10 mL  10 mL Intracatheter PRN FTruitt Merle MD        SURGICAL HISTORY:  Past Surgical History:  Procedure Laterality Date   BREAST LUMPECTOMY WITH RADIOACTIVE SEED AND SENTINEL LYMPH NODE BIOPSY Left 11/21/2020   Procedure: LEFT BREAST LUMPECTOMY WITH RADIOACTIVE SEED AND LEFT SENTINEL LYMPH NODE [place]  Surgeon: CErroll Luna MD;  [facility]  Service: General;  Laterality: Left;  PEC BLOCK; START TIME OF 11:00 AM FOR 90 MINUTES IN ROOM 2   BRONCHOSCOPY     CATARACT EXTRACTION, BILATERAL  2016   EYE SURGERY     INSERTION OF IBV VALVE Left 04/16/2018   Procedure: INSERTION OF INTERBRONCHIAL VALVE (IBV);   Surgeon: HMelrose Nakayama MD;  [facility]  Service: Thoracic;  Laterality: Left;   INSERTION OF IBV VALVE N/A 06/25/2018   Procedure: REMOVAL OF THREE INTERBRONCHIAL VALVE (IBV);  Surgeon: HMelrose Nakayama MD;  [facility]  Service: Thoracic;  Laterality: N/A;   LOBECTOMY  12/17/2011   Procedure: LOBECTOMY;  Surgeon: SMelrose Nakayama MD;  [facility]  Service: Thoracic;  Laterality: Left;  (L)VATS, WEDGE RESECTION, LEFT UPPER LOBECTOMY,  Multiple node  biopsies   PORTACATH PLACEMENT Left 11/21/2020   Procedure: INSERTION PORT-A-CATH;  Surgeon: Erroll Luna, MD;  Location: [facility];  Service: General;  Laterality: Left;   REMOVAL OF PLEURAL DRAINAGE CATHETER Left 05/06/2018   Procedure: REMOVAL OF CHEST TUBE;  Surgeon: [name], MD;  Location: [facility];  Service: Thoracic;  Laterality: Left;   THYROID SURGERY  1980   1/2 thyroid removed - benign   URETHRA SURGERY     VIDEO ASSISTED THORACOSCOPY Left 04/08/2018   Procedure: REDO LEFT VIDEO ASSISTED THORACOSCOPY with left lower lobe wedge resection;  Surgeon: [name], MD;  Location: [facility] OR;  Service: Thoracic;  Laterality: Left;   VIDEO BRONCHOSCOPY N/A 04/16/2018   Procedure: VIDEO BRONCHOSCOPY;  Surgeon: [name], MD;  Location: [facility];  Service: Thoracic;  Laterality: N/A;   VIDEO BRONCHOSCOPY N/A 06/25/2018   Procedure: VIDEO BRONCHOSCOPY;  Surgeon: [name], MD;  [facility];  Service: Thoracic;  Laterality: N/A;    REVIEW OF SYSTEMS:   Constitutional: Negative for appetite change, chills, fever and unexpected weight change. Positive for fatigue and weight loss.   HENT:   Negative for mouth sores, nosebleeds, sore throat and trouble swallowing.   Eyes: Negative for eye problems and icterus.  Respiratory: Negative for hemoptysis and wheezing. Positive for chronic SOB with exertion and productive cough.  Cardiovascular: Negative for chest pain and leg swelling.   Gastrointestinal: Negative for abdominal pain, diarrhea, nausea and vomiting. Positive for constipation.  Genitourinary: Negative for bladder incontinence, difficulty urinating, dysuria, frequency and hematuria.   Musculoskeletal: Negative for back pain, gait problem, neck pain and neck stiffness. Positive for bilateral rib pain.  Skin: Negative for itching and rash.  Neurological: Negative for dizziness, extremity weakness, gait problem, light-headedness and seizures. Positive for intermittent episodes of headaches.   Hematological: Negative for adenopathy. Does not bruise/bleed easily.  Psychiatric/Behavioral: Negative for confusion, depression and sleep disturbance. The patient is not nervous/anxious.     PHYSICAL EXAMINATION:  Blood pressure 118/80, pulse (!) 107, temperature 97.7 F (36.5 C), temperature source Oral, resp. rate 18, weight 90 lb 12.8 oz (41.2 kg), SpO2 95 %.  ECOG PERFORMANCE STATUS: 2 - Symptomatic, <50% confined to bed  Physical Exam  GENERAL:alert, no distress and comfortable.Thin and cachetic SKIN: 1 cm soft tissue nodule to b/l upper arm, close to axillas. No rash.  EYES: sclera clear LYMPH:  1-1.5 cm left supraclavicular LN with erythema, not invading skin but close to surface  LUNGS: normal breathing effort HEART: Regular rhythm and rate. No lower extremity edema ABDOMEN:abdomen soft, non-tender and normal bowel sounds NEURO: alert & oriented x 3 with fluent speech. PAC without erythema   LABORATORY DATA: Lab Results  Component Value Date   WBC 8.7 03/21/2021   HGB 12.7 03/21/2021   HCT 37.3 03/21/2021   MCV 92.6 03/21/2021   PLT 411 (H) 03/21/2021      Chemistry      Component Value Date/Time   NA 138 03/21/2021 0846   K 4.2 03/21/2021 0846   CL 99 03/21/2021 0846   CO2 26 03/21/2021 0846   BUN 12 03/21/2021 0846   CREATININE 0.57 03/21/2021 0846      Component Value Date/Time   CALCIUM 9.4 03/21/2021 0846   ALKPHOS 240 (H) 03/21/2021  0846   AST 27 03/21/2021 0846   ALT 21 03/21/2021 0846   BILITOT 0.5 03/21/2021 0846       RADIOGRAPHIC STUDIES:  DG Ribs Unilateral W/Chest  Right  Result Date: 03/05/2021 CLINICAL DATA:  RIGHT chest and rib pain. History of metastatic lung cancer. EXAM: RIGHT RIBS AND CHEST - 3+ VIEW COMPARISON:  02/03/2021 radiograph and prior studies FINDINGS: A minimally displaced fracture of the RIGHT 5th rib is noted. Cardiomediastinal silhouette is unchanged. LEFT lung surgical changes and LEFT apical opacity are again identified. There is no evidence of pneumothorax or pleural effusion. A RIGHT Port-A-Cath is again identified. IMPRESSION: Minimally displaced RIGHT 5th rib fracture. No evidence of pneumothorax or pleural effusion. Electronically Signed   By: Margarette Canada M.D.   On: 03/05/2021 11:27     ASSESSMENT/PLAN:  Valerie Mosley is a 70 y.o. female with     1. Metastatic Lung Adenocarcinoma, stage IV  -Chest CT 12/11/20 showed a large soft tissue mass in the left apical portion of the lung, with direct invasion of the left upper thoracic spine, probable pleural metastasis -Left supraclavicular lymph node biopsy on 12/22/20 confirmed metastatic adenocarcinoma -PET scan 12/27/20 showed: large hypermetabolic mass occupying left lung apex consistent with recurrent neoplasm; extensive metastatic disease involving chest, abdomen, pelvis, bony structures, muscles, and subcutaneous tissues. -Her FO and PD-L1 test result from [facility] node biopsy is still pending.  -She began first line chemo with Carboplatin and Taxol with Bevacizumab q3weeks on 01/09/21, which will cover both lung and breast cancer if she has two primaries. Goal of therapy is palliative, to control disease and prolong her life. Plan to restage 3 months into treatment.  -She is scheduled for a PET scan on 04/06/21.  -s/p cycle 1 she had moderate fatigue, low appetite, weight loss, and headaches. With cycle 2 she was changed to 2 weeks on/1 week  off starting 5/16. Tolerated better -Dr. Lennie Odor previously reviewed brain MRI which is negative for intracranial mets, but shows a 7 mm right parietal skull met and possible left parotid met vs possible primary neoplasm. -brain MRI was reviewed in H/N tumor board, this is felt to be c/w metastatic lung cancer S/p C2D1 carbo/taxol and beva she had an episode of SVT and required ED visit, she was treated and had no recurrent episodes. She otherwise tolerated treatment moderately well with low appetite, no further weight loss, fatigue, and nausea. -FO still pending. Atezolizumab added with cycle 3 taxol/carbo/beva starting 02/19/21 -s/p 4 cycles carbo/taxol and beva   2. Pain in bilateral scapula secondary to #1, Pain in bilateral ribs.  -She has pain to her left scapula, rated 5-6/10 on 12/28/20, at location of metastatic tumor -Dr. Isidore Moos discussed with patient in 11/3020 to hold on palliative Radiation for now. -Dr. Burr Medico started long acting MS Contin 39m BID. She developed nausea and only took for a few days then stopped -Xray from 03/05/2021 revealed minimally displaced right 5th rib fracture. No evidence of pneumothorax or pleural effusion.  --Patient is currently taking oxycodone 5 mg q 3 hours. Recommend to take medication every 4 hours as prescribed but okay to take 1-2 tablets. -She is still on Zoloft. She has been advised to not take her sedative medications at the same time. -headaches are intermittent and stable,  continue to monitor.   3.Weight loss, Fatigue, Secondary to chemo -She has taste change and low appetite from chemo. She has lost more weight lately. -Referred to Dietician for management -chemo reduced to weekly treatment 2 weeks on/1 week off starting with C2, tolerated better -she had no improvement in her appetite after taking mirtazapine for 1 week, so she stopped. She restarted briefly  but has no improvement and stopped -dietician visits with infusion, lost  another 3 lbs in the past two weeks. Recommend to increase Ensure protein shakes to twice a day. Continue to eat small, frequent meals.    4. 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 Her 10/2020 mammogram showed a 1.2cm mass in the 11:00 position of her left breast. 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 The anterior resection margin was positive. -given her PET scan findings of multiple subcutaneous and intramuscular metastasis, Dr. Burr Medico suspects this is lung met   -current chemo for her lung met is also effective regimen for triple negative cancer -Due to her metastatic lung cancer, will hold on adjuvant radiation for now.   5. History of LUL stage IA, T1 a, N0, M0) non-small cell lung cancer, adenocarcinoma Dx in 2013 + LLL stage Ib (T2, and 0, M0) non-small cell lung cancer, adenocarcinoma Dx 02/2018.   6. COPD and HTN  -She quit smoking in 2013 after she was diagnosed with lung cancer. -She has SOB and uses inhaler daily. Not on oxygen.   -stable   7. SVT -following C2D1 carbo/taxol/beva on 5/16 she developed dizziness, SOB, and tachycardia at home on 5/21, called EMS -she was found to be in SVT with HR 210, normalized with adenosine x1 in the field -she was brought to [facility] ED, labs showed elevated troponins, dehydration. No UTI -she was given IVF, IV fentanyl. Normalized and she was discharged home, no recurrent episodes. -seen by cardiology Dr. Cristopher Peru   8. Constipation: -Currently on miralax once daily with a bowel movement ever 3 days. -Advised to increase miralax to twice daily.   Plan: Valerie Mosley  returns today for cycle 4, day 8 with carbo/taxol. Patient reports increased fatigue but continues to complete her basic ADLs. She has lost another 3 lbs although her appetite is stable. Labs from today were reviewed without any intervention needed. CBC was unremarkable except for mild thrombocytosis. CMP revealed LFTs are back to normal limits. Overall, she continues to tolerate the treatment so plan to proceed as planned. She is scheduled for restaging PET scan on 04/06/2021. Patient will return for a follow up to see Dr. Burr Medico prior to cycle 5, day 1 with  carbo/taxol, mvasi, and tecentriq.    No orders of the defined types were placed in this encounter.  Patient expressed understanding and satisfaction with the plan provided.   I have spent a total of 30 minutes minutes of face-to-face and non-face-to-face time, preparing to see the patient, obtaining and/or reviewing separately obtained history, performing a medically appropriate examination, counseling and educating the patient, documenting clinical information in the electronic health record, and care coordination.   Lincoln Brigham PA-C [facility] [place]

## 2021-03-20 NOTE — Telephone Encounter (Signed)
Patient is aware of x-ray result and verbalized understanding. Patient is aware to take pain meds if needed and call with any questions/concerns.

## 2021-03-21 ENCOUNTER — Ambulatory Visit: Payer: Medicare Other | Admitting: Hematology

## 2021-03-21 ENCOUNTER — Telehealth: Payer: Self-pay

## 2021-03-21 ENCOUNTER — Inpatient Hospital Stay: Payer: Medicare Other

## 2021-03-21 ENCOUNTER — Other Ambulatory Visit: Payer: Self-pay

## 2021-03-21 ENCOUNTER — Inpatient Hospital Stay (HOSPITAL_BASED_OUTPATIENT_CLINIC_OR_DEPARTMENT_OTHER): Payer: Medicare Other | Admitting: Physician Assistant

## 2021-03-21 ENCOUNTER — Inpatient Hospital Stay: Payer: Medicare Other | Attending: Physician Assistant

## 2021-03-21 VITALS — BP 118/80 | HR 107 | Temp 97.7°F | Resp 18 | Wt 90.8 lb

## 2021-03-21 VITALS — HR 103

## 2021-03-21 DIAGNOSIS — C3492 Malignant neoplasm of unspecified part of left bronchus or lung: Secondary | ICD-10-CM

## 2021-03-21 DIAGNOSIS — K5909 Other constipation: Secondary | ICD-10-CM

## 2021-03-21 DIAGNOSIS — C7989 Secondary malignant neoplasm of other specified sites: Secondary | ICD-10-CM | POA: Diagnosis not present

## 2021-03-21 DIAGNOSIS — C50212 Malignant neoplasm of upper-inner quadrant of left female breast: Secondary | ICD-10-CM | POA: Insufficient documentation

## 2021-03-21 DIAGNOSIS — R634 Abnormal weight loss: Secondary | ICD-10-CM | POA: Diagnosis not present

## 2021-03-21 DIAGNOSIS — Z5112 Encounter for antineoplastic immunotherapy: Secondary | ICD-10-CM | POA: Insufficient documentation

## 2021-03-21 DIAGNOSIS — K5900 Constipation, unspecified: Secondary | ICD-10-CM | POA: Insufficient documentation

## 2021-03-21 DIAGNOSIS — Z79899 Other long term (current) drug therapy: Secondary | ICD-10-CM | POA: Diagnosis not present

## 2021-03-21 DIAGNOSIS — Z171 Estrogen receptor negative status [ER-]: Secondary | ICD-10-CM | POA: Insufficient documentation

## 2021-03-21 DIAGNOSIS — R200 Anesthesia of skin: Secondary | ICD-10-CM | POA: Diagnosis not present

## 2021-03-21 DIAGNOSIS — C3432 Malignant neoplasm of lower lobe, left bronchus or lung: Secondary | ICD-10-CM | POA: Insufficient documentation

## 2021-03-21 DIAGNOSIS — C7951 Secondary malignant neoplasm of bone: Secondary | ICD-10-CM | POA: Diagnosis not present

## 2021-03-21 DIAGNOSIS — Z87891 Personal history of nicotine dependence: Secondary | ICD-10-CM | POA: Diagnosis not present

## 2021-03-21 DIAGNOSIS — Z5111 Encounter for antineoplastic chemotherapy: Secondary | ICD-10-CM | POA: Diagnosis not present

## 2021-03-21 DIAGNOSIS — C770 Secondary and unspecified malignant neoplasm of lymph nodes of head, face and neck: Secondary | ICD-10-CM | POA: Insufficient documentation

## 2021-03-21 DIAGNOSIS — Z95828 Presence of other vascular implants and grafts: Secondary | ICD-10-CM

## 2021-03-21 LAB — CBC WITH DIFFERENTIAL (CANCER CENTER ONLY)
Abs Immature Granulocytes: 0.03 K/uL (ref 0.00–0.07)
Basophils Absolute: 0.1 K/uL (ref 0.0–0.1)
Basophils Relative: 1 %
Eosinophils Absolute: 0.1 K/uL (ref 0.0–0.5)
Eosinophils Relative: 2 %
HCT: 37.3 % (ref 36.0–46.0)
Hemoglobin: 12.7 g/dL (ref 12.0–15.0)
Immature Granulocytes: 0 %
Lymphocytes Relative: 16 %
Lymphs Abs: 1.4 K/uL (ref 0.7–4.0)
MCH: 31.5 pg (ref 26.0–34.0)
MCHC: 34 g/dL (ref 30.0–36.0)
MCV: 92.6 fL (ref 80.0–100.0)
Monocytes Absolute: 0.8 K/uL (ref 0.1–1.0)
Monocytes Relative: 9 %
Neutro Abs: 6.4 K/uL (ref 1.7–7.7)
Neutrophils Relative %: 72 %
Platelet Count: 411 K/uL — ABNORMAL HIGH (ref 150–400)
RBC: 4.03 MIL/uL (ref 3.87–5.11)
RDW: 19.5 % — ABNORMAL HIGH (ref 11.5–15.5)
WBC Count: 8.7 K/uL (ref 4.0–10.5)
nRBC: 0 % (ref 0.0–0.2)

## 2021-03-21 LAB — CMP (CANCER CENTER ONLY)
ALT: 21 U/L (ref 0–44)
AST: 27 U/L (ref 15–41)
Albumin: 2.8 g/dL — ABNORMAL LOW (ref 3.5–5.0)
Alkaline Phosphatase: 240 U/L — ABNORMAL HIGH (ref 38–126)
Anion gap: 13 (ref 5–15)
BUN: 12 mg/dL (ref 8–23)
CO2: 26 mmol/L (ref 22–32)
Calcium: 9.4 mg/dL (ref 8.9–10.3)
Chloride: 99 mmol/L (ref 98–111)
Creatinine: 0.57 mg/dL (ref 0.44–1.00)
GFR, Estimated: >60 mL/min
Glucose, Bld: 76 mg/dL (ref 70–99)
Potassium: 4.2 mmol/L (ref 3.5–5.1)
Sodium: 138 mmol/L (ref 135–145)
Total Bilirubin: 0.5 mg/dL (ref 0.3–1.2)
Total Protein: 6.9 g/dL (ref 6.5–8.1)

## 2021-03-21 MED ORDER — FAMOTIDINE 20 MG IN NS 100 ML IVPB
INTRAVENOUS | Status: AC
  Filled 2021-03-21: qty 100

## 2021-03-21 MED ORDER — DIPHENHYDRAMINE HCL 50 MG/ML IJ SOLN
50.0000 mg | Freq: Once | INTRAMUSCULAR | Status: AC
  Administered 2021-03-21: 50 mg via INTRAVENOUS

## 2021-03-21 MED ORDER — PALONOSETRON HCL INJECTION 0.25 MG/5ML
0.2500 mg | Freq: Once | INTRAVENOUS | Status: AC
  Administered 2021-03-21: 0.25 mg via INTRAVENOUS

## 2021-03-21 MED ORDER — DIPHENHYDRAMINE HCL 50 MG/ML IJ SOLN
INTRAMUSCULAR | Status: AC
  Filled 2021-03-21: qty 1

## 2021-03-21 MED ORDER — SODIUM CHLORIDE 0.9 % IV SOLN
10.0000 mg | Freq: Once | INTRAVENOUS | Status: AC
  Administered 2021-03-21: 10 mg via INTRAVENOUS
  Filled 2021-03-21: qty 10

## 2021-03-21 MED ORDER — FAMOTIDINE 20 MG IN NS 100 ML IVPB
20.0000 mg | Freq: Once | INTRAVENOUS | Status: AC
Start: 2021-03-21 — End: 2021-03-21
  Administered 2021-03-21: 20 mg via INTRAVENOUS

## 2021-03-21 MED ORDER — SODIUM CHLORIDE 0.9% FLUSH
10.0000 mL | INTRAVENOUS | Status: DC | PRN
  Administered 2021-03-21: 10 mL
  Filled 2021-03-21: qty 10

## 2021-03-21 MED ORDER — SODIUM CHLORIDE 0.9 % IV SOLN
Freq: Once | INTRAVENOUS | Status: AC
Start: 2021-03-21 — End: 2021-03-21
  Filled 2021-03-21: qty 250

## 2021-03-21 MED ORDER — PALONOSETRON HCL INJECTION 0.25 MG/5ML
INTRAVENOUS | Status: AC
  Filled 2021-03-21: qty 5

## 2021-03-21 MED ORDER — HEPARIN SOD (PORK) LOCK FLUSH 100 UNIT/ML IV SOLN
500.0000 [IU] | Freq: Once | INTRAVENOUS | Status: AC | PRN
  Administered 2021-03-21: 500 [IU]
  Filled 2021-03-21: qty 5

## 2021-03-21 MED ORDER — SODIUM CHLORIDE 0.9% FLUSH
10.0000 mL | Freq: Once | INTRAVENOUS | Status: AC
  Administered 2021-03-21: 10 mL
  Filled 2021-03-21: qty 10

## 2021-03-21 MED ORDER — SODIUM CHLORIDE 0.9 % IV SOLN
150.0000 mg | Freq: Once | INTRAVENOUS | Status: AC
  Administered 2021-03-21: 150 mg via INTRAVENOUS
  Filled 2021-03-21: qty 150

## 2021-03-21 MED ORDER — SODIUM CHLORIDE 0.9 % IV SOLN
80.0000 mg/m2 | Freq: Once | INTRAVENOUS | Status: AC
  Administered 2021-03-21: 114 mg via INTRAVENOUS
  Filled 2021-03-21: qty 19

## 2021-03-21 MED ORDER — SODIUM CHLORIDE 0.9 % IV SOLN
90.0000 mg | Freq: Once | INTRAVENOUS | Status: AC
  Administered 2021-03-21: 90 mg via INTRAVENOUS
  Filled 2021-03-21: qty 9

## 2021-03-21 NOTE — Progress Notes (Signed)
Per Dede Query, PA-C - okay to proceed with elevated heart rate.

## 2021-03-21 NOTE — Patient Instructions (Signed)
[facility]  Discharge Instructions: Thank you for choosing [facility] to provide your oncology and hematology care.   If you have a lab appointment with the Cancer Center, please go directly to the Cancer Center and check in at the registration area.   Wear comfortable clothing and clothing appropriate for easy access to any Portacath or PICC line.   We strive to give you quality time with your provider. You may need to reschedule your appointment if you arrive late (15 or more minutes).  Arriving late affects you and other patients whose appointments are after yours.  Also, if you miss three or more appointments without notifying the office, you may be dismissed from the clinic at the provider's discretion.      For prescription refill requests, have your pharmacy contact our office and allow 72 hours for refills to be completed.    Today you received the following chemotherapy and/or immunotherapy agents: Paclitaxel (Taxol) and Carboplatin.   To help prevent nausea and vomiting after your treatment, we encourage you to take your nausea medication as directed.  BELOW ARE SYMPTOMS THAT SHOULD BE REPORTED IMMEDIATELY: *FEVER GREATER THAN 100.4 F (38 C) OR HIGHER *CHILLS OR SWEATING *NAUSEA AND VOMITING THAT IS NOT CONTROLLED WITH YOUR NAUSEA MEDICATION *UNUSUAL SHORTNESS OF BREATH *UNUSUAL BRUISING OR BLEEDING *URINARY PROBLEMS (pain or burning when urinating, or frequent urination) *BOWEL PROBLEMS (unusual diarrhea, constipation, pain near the anus) TENDERNESS IN MOUTH AND THROAT WITH OR WITHOUT PRESENCE OF ULCERS (sore throat, sores in mouth, or a toothache) UNUSUAL RASH, SWELLING OR PAIN  UNUSUAL VAGINAL DISCHARGE OR ITCHING   Items with * indicate a potential emergency and should be followed up as soon as possible or go to the Emergency Department if any problems should occur.  Please show the CHEMOTHERAPY ALERT CARD or IMMUNOTHERAPY  ALERT CARD at check-in to the Emergency Department and triage nurse.  Should you have questions after your visit or need to cancel or reschedule your appointment, please contact [facility]  Dept: [phone]  and follow the prompts.  Office hours are 8:00 a.m. to 4:30 p.m. Monday - Friday. Please note that voicemails left after 4:00 p.m. may not be returned until the following business day.  We are closed weekends and major holidays. You have access to a nurse at all times for urgent questions. Please call the main number to the clinic Dept: [phone] and follow the prompts.   For any non-urgent questions, you may also contact your provider using MyChart. We now offer e-Visits for anyone 18 and older to request care online for non-urgent symptoms. For details visit [email or web address].   Also download the MyChart app! Go to the app store, search "MyChart", open the app, select [facility], and log in with your MyChart username and password.  Due to Covid, a mask is required upon entering the hospital/clinic. If you do not have a mask, one will be given to you upon arrival. For doctor visits, patients may have 1 support person aged 18 or older with them. For treatment visits, patients cannot have anyone with them due to current Covid guidelines and our immunocompromised population.   

## 2021-03-21 NOTE — Telephone Encounter (Signed)
This LPN called [facility] to f/u on results. Client services states test was cancelled as Medicare does not cover and advanced beneficiary notice was not received signed by pt. Client Services states they tried reaching out several times to obtain this. Message sent to Dr Burr Medico to inquire about other testing.

## 2021-03-23 ENCOUNTER — Telehealth: Payer: Self-pay

## 2021-03-23 NOTE — Telephone Encounter (Signed)
Per Dede Query, PA I tried contacting centralized scheduling today to see if we could move the patient's NM PET scan to a date prior to her appointments on 7/18. I was advised that 7/22 was the soonest available time that [place] Roni Friberg had for the NM PET scan. Dede Query, PA notified.

## 2021-03-27 NOTE — Progress Notes (Signed)
Per Dr Ernestina Penna request.  I have send email requesting [facility] testing on tissue specimen taken on 12/22/2020.  CASE: [id number]

## 2021-03-28 DIAGNOSIS — C3490 Malignant neoplasm of unspecified part of unspecified bronchus or lung: Secondary | ICD-10-CM | POA: Diagnosis not present

## 2021-03-30 MED FILL — Dexamethasone Sodium Phosphate Inj 100 MG/10ML: INTRAMUSCULAR | Qty: 1 | Status: AC

## 2021-04-01 ENCOUNTER — Other Ambulatory Visit: Payer: Self-pay | Admitting: Hematology

## 2021-04-01 DIAGNOSIS — C3492 Malignant neoplasm of unspecified part of left bronchus or lung: Secondary | ICD-10-CM

## 2021-04-01 NOTE — Progress Notes (Signed)
[place]   [phone] [phone]   Clinic Follow up Note   Patient Care Team: [name], MD as PCP - General (Family Medicine) Collene Gobble, MD (Pulmonary Disease) Erroll Luna, MD as Consulting Physician (General Surgery) Truitt Merle, MD as Consulting Physician (Hematology)  Date of Service:  04/02/2021  CHIEF COMPLAINT: f/u of metastatic lung cancer  SUMMARY OF ONCOLOGIC HISTORY: Oncology History Overview Note  Cancer Staging Malignant neoplasm of upper-inner quadrant of left breast in female, estrogen receptor negative ([place]) Staging form: Breast, AJCC 8th Edition - Clinical stage from 11/01/2020: Stage IB (cT1c, cN0, cM0, G3, ER-, PR-, HER2-) - Signed by Truitt Merle, MD on 11/08/2020 Stage prefix: Initial diagnosis - Pathologic stage from 11/21/2020: Stage IB (pT1c, pN0, cM0, G3, ER-, PR-, HER2-) - Signed by [name], NP on 12/20/2020 Stage prefix: Initial diagnosis Histologic grading system: 3 grade system  Non-small cell lung cancer ([place]) Staging form: Lung, AJCC 7th Edition - Clinical: Stage IA (Free text: Ia) - Signed by [name], MD on 12/28/2013 Laterality: Left Cancer stage: Ia - Pathologic: Ia - Signed by [name], MD on 12/28/2013 Laterality: Left Cancer stage: Ia    Non-small cell lung cancer ([place])  09/02/2011 Imaging   CT Chest  IMPRESSION:   1.  Interval clearing of right upper lobe pneumonia.  2.  Left upper lobe nodule is unchanged in the short interval from  07/29/2011.  Follow-up could be performed in 3 months to ensure  continued stability. This recommendation follows the consensus  statement: Guidelines for Management of Small Pulmonary Nodules Detected on CT Scans:  A Statement from the [employer] as published in Radiology 2005; 237:395-400.  Available online at: https://www.arnold.com/.  3.  Additional scattered pulmonary nodules  can be reevaluated on  future imaging as well.    11/06/2011 Imaging   PET IMPRESSION:   1.  Mild hypermetabolism which projects minimally cephalad to, but is felt to correspond to the left upper lobe lung nodule.  Although not within malignant range, given the small size of the nodule, malignancy cannot be excluded.  Possible mild interval enlargement of the nodule since 09/02/2011.  Consider repeat standard chest CT to confirm interval enlargement.  Especially if interval enlargement has occurred, tissue sampling would be suggested. Alternatively, 35-month follow-up chest CT could be performed.  2.  No evidence of thoracic nodal or extrathoracic disease.  3. Vague hypermetabolism corresponding to a prominent right lobe of the thyroid. Nonspecific.  Consider ultrasound correlation.   12/17/2011 Surgery   Thoracoscopy by Dr Roxan Hockey   Diagnosis 1. Lung, wedge biopsy/resection, left upper lobe - INVASIVE WELL-DIFFERENTIATED ADENOCARCINOMA, SPANNING 1.2 CM. - NO LYMPH VASCULAR INVASION IDENTIFIED. - PLEURA IS UNINVOLVED. - MARGINS ARE NEGATIVE. - SEE ONCOLOGY TEMPLATE. 2. Lymph node, biopsy, level 9 (2) - BENIGN LYMPH NODES WITH NO TUMOR SEEN (0/2). 3. Lymph node, biopsy, 11 (3) - BENIGN LYMPH NODES WITH NO TUMOR SEEN (0/3). 4. Lymph node, biopsy, 5 - ONE BENIGN LYMPH NODE WITH NO TUMOR SEEN (0/1). 5. Lymph node, biopsy, 10 - ONE BENIGN LYMPH NODE WITH NO TUMOR SEEN (0/1). 6. Lung, resection (segmental or lobe), Remainder of left upper lobe - BENIGN LUNG PARENCHYMA WITH CHRONIC INFLAMMATION AND INCREASED PULMONARY MACROPHAGES. - THREE BENIGN LYMPH NODES IDENTIFIED (0/3). - NO TUMOR SEEN.    01/08/2012 Initial Diagnosis   Non-small cell lung cancer ([place])   02/10/2018 Imaging   CT Chest IMPRESSION: 1. Enlarging irregular pleural-based solid  1.9 cm pulmonary nodule in the superior segment left lower lobe, suspicious for malignancy, either recurrent disease or a metachronous primary  bronchogenic carcinoma. Consider PET-CT for further characterization. 2. Additional scattered bilateral pulmonary nodules are stable and considered benign. 3. No noncontrast CT evidence of thoracic adenopathy. 4. Three-vessel coronary atherosclerosis. 5. Nonobstructing left nephrolithiasis.   Aortic Atherosclerosis (ICD10-I70.0) and Emphysema (ICD10-J43.9).   02/18/2018 PET scan   IMPRESSION: 1. Hypermetabolic apical nodule in the superior segment left lower lobe is highly worrisome for bronchogenic carcinoma. 2. No additional areas of abnormal hypermetabolism in the neck, chest, abdomen or pelvis. 3. Aortic atherosclerosis (ICD10-170.0). Coronary artery calcification. 4.  Emphysema (ICD10-J43.9). 5. Left renal stone.     03/06/2018 Pathology Results   Lung Biopsy  Diagnosis Lung, needle/core biopsy(ies), Left Lower Lobe - ADENOCARCINOMA. Microscopic Comment There is likely sufficient tissue for additional studies if requested (block 1B). Dr. Lyndon Code has reviewed the case.   04/08/2018 Surgery   Left Video assisted Thoracoscopy by Dr Roxan Hockey  Diagnosis Lung, wedge biopsy/resection, Left Lower Lobe - INVASIVE ADENOCARCINOMA, MODERATELY DIFFERENTIATED, SPANNING 1.3 CM. - ADENOCARCINOMA INVOLVES VISCERAL PLEURA. - ONE BENIGN INTRAPULMONARY LYMPH NODE (0/1). - LYMPHOVASCULAR INVASION IS IDENTIFIED, FOCAL. - THE SURGICAL RESECTION MARGINS ARE NEGATIVE FOR CARCINOMA. - SEE ONCOLOGY TABLE BELOW.    08/01/2020 Imaging   CT Chest  IMPRESSION: Stable postop changes from prior left upper lobectomy. Stable small bilateral pulmonary nodules. No new or progressive disease within the thorax.   12/11/2020 Imaging   CT Chest  IMPRESSION: 1. Signs of tumor recurrence within the apical portion of the left hemithorax with large rind of increased soft tissue. Tumor extends into the left upper thoracic spine paravertebral soft tissues. 2. Pleural spread of disease is noted with mild  progressive pleural thickening overlying the posterior and lateral left lower lung. 3. Interval development of increased interstitial markings within the left upper lobe adjacent to mass. In the absence of interval external beam radiation (since 08/01/2020) this is suspicious for lymphangitic spread of tumor. 4. Progressive soft tissue mass the within the paravertebral right upper lobe concerning for metastatic disease. 5. New enlarged anterior mediastinal and left supraclavicular lymph nodes compatible with metastatic adenopathy. 6. Indeterminate soft tissue attenuating nodular density between the upper pole of left kidney and left adrenal gland. Cannot exclude nodal metastasis or adrenal gland metastasis. 7. Aortic atherosclerosis. (ICD10-I70.0).   12/11/2020 Relapse/Recurrence      12/27/2020 Imaging   PET scan IMPRESSION: 1. Large hypermetabolic mass occupying the left lung apex consistent with recurrent neoplasm. 2. Extensive metastatic disease involving the chest, abdomen, pelvis, bony structures, muscles and subcutaneous tissues.   Malignant neoplasm of upper-inner quadrant of left breast in female, estrogen receptor negative ([place])  10/18/2020 Mammogram   Mammogram 10/18/20 at [name] FINDINGS:  Cc and MLO views of bilateral breasts are submitted. There is a  spiculated mass in the palpable area upper left breast. The right  breast is negative.   Targeted ultrasound is performed, showing 1.2 x 0.9 x 1.2 cm  spiculated hypoechoic mass at the left breast 11 o'clock 8 cm from  nipple palpable area. This correlates to the mammographic mass.  Ultrasound of the left axilla is negative.   IMPRESSION:  Highly suspicious findings.     10/26/2020 Initial Biopsy   Final Pathologic Diagnosis  10/26/20 at [facility] BREAST, LEFT 1:00 8 CM FROM NIPPLE, NEEDLE BIOPSY:      -INVASIVE DUCTAL CARCINOMA, NOTTINGHAM GRADE 3.  Prognostic Markers in Cancer  -Estrogen  Receptor:  Negative (<1%)   -Progesterone Receptor:  Negative (<1%)  -Her2:  Negative (score=1+)     Ki-67:  Percentage of tumor cells with nuclear positivity: 60 %      11/01/2020 Cancer Staging   Staging form: Breast, AJCC 8th Edition - Clinical stage from 11/01/2020: Stage IB (cT1c, cN0, cM0, G3, ER-, PR-, HER2-) - Signed by Truitt Merle, MD on 11/08/2020  Stage prefix: Initial diagnosis    11/08/2020 Initial Diagnosis   Malignant neoplasm of upper-inner quadrant of left breast in female, estrogen receptor negative ([place])   11/21/2020 Surgery   LEFT BREAST LUMPECTOMY WITH RADIOACTIVE SEED AND LEFT SENTINEL LYMPH NODE [place] and PAC placement by Dr Brantley Stage    11/21/2020 Pathology Results   FINAL MICROSCOPIC DIAGNOSIS:   A. BREAST, LEFT, LUMPECTOMY:  - Invasive ductal carcinoma, grade 3, 1.3 cm  - The anterior resection margin is widely involved by carcinoma  - Carcinoma is 0.2 cm from posterior margin  - Biopsy site changes  - See oncology table   B. BREAST, LEFT ADDITIONAL SUPERIOR MARGIN, EXCISION:  - Unremarkable fibroadipose tissue, negative for carcinoma   C. BREAST, LEFT ADDITIONAL LATERAL MARGIN, EXCISION:  - Unremarkable fibroadipose tissue, negative for carcinoma   D. BREAST, LEFT ADDITIONAL INFEROMEDIAL MARGIN, EXCISION:  - Unremarkable fibroadipose tissue, negative for carcinoma   E. LYMPH NODE, LEFT AXILLARY, SENTINEL, EXCISION:  - Lymph node, negative for carcinoma (0/1)    11/21/2020 Cancer Staging   Staging form: Breast, AJCC 8th Edition - Pathologic stage from 11/21/2020: Stage IB (pT1c, pN0, cM0, G3, ER-, PR-, HER2-) - Signed by [name], NP on 12/20/2020  Stage prefix: Initial diagnosis  Histologic grading system: 3 grade system    Metastatic lung cancer (metastasis from lung to other site), left ([place])  12/11/2020 Imaging   CT Chest  IMPRESSION: 1. Signs of tumor recurrence within the apical portion of the left hemithorax with large rind of increased soft tissue. Tumor  extends into the left upper thoracic spine paravertebral soft tissues. 2. Pleural spread of disease is noted with mild progressive pleural thickening overlying the posterior and lateral left lower lung. 3. Interval development of increased interstitial markings within the left upper lobe adjacent to mass. In the absence of interval external beam radiation (since 08/01/2020) this is suspicious for lymphangitic spread of tumor. 4. Progressive soft tissue mass the within the paravertebral right upper lobe concerning for metastatic disease. 5. New enlarged anterior mediastinal and left supraclavicular lymph nodes compatible with metastatic adenopathy. 6. Indeterminate soft tissue attenuating nodular density between the upper pole of left kidney and left adrenal gland. Cannot exclude nodal metastasis or adrenal gland metastasis. 7. Aortic atherosclerosis. (ICD10-I70.0).   12/22/2020 Pathology Results   A. LYMPH NODE, LEFT NECK, NEEDLE CORE BIOPSY:  - Metastatic adenocarcinoma.   COMMENT:  CK7 is positive.  CK20, TTF-1, Napsin-A, CDX-2, PAX 8, GATA-3 and ER are negative.  The limited CK7 positive staining is compatible with origin from lung; however, it does not exclude origin from other entities. If applicable, there is likely sufficient tissue for ancillary studies.    12/27/2020 Imaging   PET scan IMPRESSION: 1. Large hypermetabolic mass occupying the left lung apex consistent with recurrent neoplasm. 2. Extensive metastatic disease involving the chest, abdomen, pelvis, bony structures, muscles and subcutaneous tissues.   01/07/2021 Initial Diagnosis   Metastatic lung cancer (metastasis from lung to other site), left ([place])   01/08/2021 -  Chemotherapy   First-line Carboplatin and Taxol with Bevacuzimab  q3weeks starting 01/08/21. Due to significant fatigue and weight loss, reduced chemo to 2 weeks on/1 week off treatment starting with C2 on 01/29/21.  --Added Tecentriq from C3D1 (02/22/21).     01/30/2021 Imaging   MRI Brain  IMPRESSION: 1. No evidence of intracranial metastatic disease. 2. A 7 mm rim enhancing lesion in the right parietal bone may represent bone metastasis. 3. A 2.4 heterogeneously enhancing lesion within the deep lobe of the left parotid gland may represent metastasis versus primary parotid neoplasm. 4. Diffuse decrease of the T1 signal within the visualized spine may represent red marrow reconversion. However, marrow replacement pathologies cannot be entirely excluded.     02/19/2021 -  Chemotherapy   Added Tecentriq q3weeks on 02/19/21 (In addition to CT from C3)      CURRENT THERAPY:  First-line Carboplatin and Taxol with Bevacuzimab q3weeks starting 01/08/21. Due to significant fatigue and weight loss, reduced chemo to 2 weeks on/1 week off treatment starting with C2 on 01/29/21.  --Added Tecentriq q3weeks on 02/19/21 (C3 CT)  INTERVAL HISTORY:  Valerie Mosley is here for a follow up of metastatic lung cancer. She was last seen by me on 02/26/21 and by NP [name] and PA Murray Hodgkins in the interim. She presents to the clinic by her self.   She is not doing well overall.  She reports severe left-sided rib cage pain in the past week, she has restarted smoking extensor release 15 mg twice a day, and takes oxycodone as needed, average twice a day, but her pain is still not controlled.  Right side rib pain has improved.  She wears a chest vest which helps her pain.  She has very little appetite and energy level, came in a wheelchair.  No fever, chills.  She has not been able to do much at home.  She feels like she is not able to do more chemo after today.  She has a PET scan scheduled later this week for restaging.  She has lost about 10 pounds in the past months, she weighed 88 pounds today.   All other systems were reviewed with the patient and are negative.  MEDICAL HISTORY:  Past Medical History:  Diagnosis Date   Breast cancer ([place])    Cancer ([place])    Stage IA  non-small cell lung cancer, left upper lobectomy 12/2011   COPD (chronic obstructive pulmonary disease) ([facility])    Cough 07/29/11   started coughing up blood this am   Cough    Hypertension    Kyphoscoliosis    Pneumonia    PONV (postoperative nausea and vomiting)    Recurrent upper respiratory infection ([facility])     SURGICAL HISTORY: Past Surgical History:  Procedure Laterality Date   BREAST LUMPECTOMY WITH RADIOACTIVE SEED AND SENTINEL LYMPH NODE BIOPSY Left 11/21/2020   Procedure: LEFT BREAST LUMPECTOMY WITH RADIOACTIVE SEED AND LEFT SENTINEL LYMPH NODE [place];  Surgeon: Erroll Luna, MD;  Location: [facility];  Service: General;  Laterality: Left;  PEC BLOCK; START TIME OF 11:00 AM FOR 90 MINUTES IN ROOM 2   BRONCHOSCOPY     CATARACT EXTRACTION, BILATERAL  2016   EYE SURGERY     INSERTION OF IBV VALVE Left 04/16/2018   Procedure: INSERTION OF INTERBRONCHIAL VALVE (IBV);  Surgeon: [name], MD;  Location: [place];  Service: Thoracic;  Laterality: Left;   INSERTION OF IBV VALVE N/A 06/25/2018   Procedure: REMOVAL OF THREE INTERBRONCHIAL VALVE (IBV);  Surgeon: [name], MD;  Location:  [facility];  Service: Thoracic;  Laterality: N/A;   LOBECTOMY  12/17/2011   Procedure: LOBECTOMY;  Surgeon: [name], MD;  Location: [place];  Service: Thoracic;  Laterality: Left;  (L)VATS, WEDGE RESECTION, LEFT UPPER LOBECTOMY,  Multiple node biopsies   PORTACATH PLACEMENT Left 11/21/2020   Procedure: INSERTION PORT-A-CATH;  Surgeon: Erroll Luna, MD;  Location: [place];  Service: General;  Laterality: Left;   REMOVAL OF PLEURAL DRAINAGE CATHETER Left 05/06/2018   Procedure: REMOVAL OF CHEST TUBE;  Surgeon: [name], MD;  Location: [place];  Service: Thoracic;  Laterality: Left;   THYROID SURGERY  1980   1/2 thyroid removed - benign   URETHRA SURGERY     VIDEO ASSISTED THORACOSCOPY Left 04/08/2018   Procedure: REDO LEFT VIDEO ASSISTED THORACOSCOPY with left lower lobe wedge  resection;  Surgeon: [name], MD;  [facility];  Service: Thoracic;  Laterality: Left;   VIDEO BRONCHOSCOPY N/A 04/16/2018   Procedure: VIDEO BRONCHOSCOPY;  Surgeon: [name], MD;  Location: [place];  Service: Thoracic;  Laterality: N/A;   VIDEO BRONCHOSCOPY N/A 06/25/2018   Procedure: VIDEO BRONCHOSCOPY;  Surgeon: [name], MD;  Location: [place];  Service: Thoracic;  Laterality: N/A;    I have reviewed the social history and family history with the patient and they are unchanged from previous note.  ALLERGIES:  has No Known Allergies.  MEDICATIONS:  Current Outpatient Medications  Medication Sig Dispense Refill   OLANZapine (ZYPREXA) 5 MG tablet Take 1 tablet (5 mg total) by mouth at bedtime. 30 tablet 0   promethazine (PHENERGAN) 25 MG tablet Take 1 tablet (25 mg total) by mouth every 6 (six) hours as needed for nausea or vomiting. 30 tablet 0   acetaminophen (TYLENOL) 500 MG tablet Take 1,000 mg by mouth every 6 (six) hours as needed for moderate pain or headache.     acyclovir ointment (ZOVIRAX) 5 % Apply 1 application topically every 3 (three) hours as needed. 15 g 0   albuterol (VENTOLIN HFA) 108 (90 Base) MCG/ACT inhaler Inhale 2 puffs into the lungs every 4 (four) hours as needed for wheezing or shortness of breath. For shortness of breath 8.51 g 5   BEVESPI AEROSPHERE 9-4.8 MCG/ACT AERO INHALE 2 PUFFS INTO THE LUNGS 2 TIMES DAILY. (Patient taking differently: Inhale 2 puffs into the lungs as needed (wheezing, shortness of breath).) 10.7 g 11   dexamethasone (DECADRON) 4 MG tablet Take 2 tablets (8 mg total) by mouth daily. Start the day after carboplatin chemotherapy for 3 days. (Patient taking differently: Take 4 mg by mouth See admin instructions. 4 mg 2 days after chemo.) 30 tablet 1   lidocaine-prilocaine (EMLA) cream Apply to affected area once (Patient taking differently: Apply 1 application topically See admin instructions. Apply to affected  area or port before/after treatment.) 30 g 3   montelukast (SINGULAIR) 10 MG tablet Take 1 tablet (10 mg total) by mouth at bedtime. 30 tablet 5   ondansetron (ZOFRAN) 8 MG tablet Take 1 tablet (8 mg total) by mouth 2 (two) times daily as needed for refractory nausea / vomiting. Start on day 3 after carboplatin chemo. 30 tablet 1   oxyCODONE (OXY IR/ROXICODONE) 5 MG immediate release tablet Take 1 tablet (5 mg total) by mouth every 4 (four) hours as needed for severe pain. 90 tablet 0   prochlorperazine (COMPAZINE) 10 MG tablet Take 1 tablet (10 mg total) by mouth every 6 (six) hours as needed (Nausea  or vomiting). 30 tablet 1   No current facility-administered medications for this visit.   Facility-Administered Medications Ordered in Other Visits  Medication Dose Route Frequency Provider Last Rate Last Admin   0.9 %  sodium chloride infusion   Intravenous Once PRN Truitt Merle, MD       albuterol (PROVENTIL) (2.5 MG/3ML) 0.083% nebulizer solution 2.5 mg  2.5 mg Nebulization Once PRN Truitt Merle, MD       alteplase (CATHFLO ACTIVASE) injection 2 mg  2 mg Intracatheter Once PRN Truitt Merle, MD       Cold Pack 1 packet  1 packet Topical Once PRN Truitt Merle, MD       EPINEPHrine (EPI-PEN) injection 0.3 mg  0.3 mg Intramuscular Once PRN Truitt Merle, MD       famotidine (PEPCID) IVPB 20 mg in NS 100 mL IVPB  20 mg Intravenous Once PRN Truitt Merle, MD       heparin lock flush 100 unit/mL  250 Units Intracatheter Once PRN Truitt Merle, MD       methylPREDNISolone sodium succinate (SOLU-MEDROL) 125 mg/2 mL injection 125 mg  125 mg Intravenous Once PRN Truitt Merle, MD       sodium chloride flush (NS) 0.9 % injection 10 mL  10 mL Intracatheter PRN Truitt Merle, MD   10 mL at 04/02/21 1302   sodium chloride flush (NS) 0.9 % injection 3 mL  3 mL Intracatheter PRN Truitt Merle, MD        PHYSICAL EXAMINATION: ECOG PERFORMANCE STATUS: 3 - Symptomatic, >50% confined to bed Weight 88 pounds, blood pressure 108/81, heart rate 108,  respirate 18, pulse ox 94% on room air GENERAL:alert, no distress and comfortable, cachectic SKIN: skin color, texture, turgor are normal, no rashes or significant lesions EYES: normal, Conjunctiva are pink and non-injected, sclera clear LUNGS: clear to auscultation and percussion with normal breathing effort HEART: regular rate & rhythm and no murmurs and no lower extremity edema ABDOMEN:abdomen soft, non-tender and normal bowel sounds Musculoskeletal:no cyanosis of digits and no clubbing  NEURO: alert & oriented x 3 with fluent speech, no focal motor/sensory deficits  LABORATORY DATA:  I have reviewed the data as listed CBC Latest Ref Rng & Units 04/02/2021 03/21/2021 03/12/2021  WBC 4.0 - 10.5 K/uL 10.0 8.7 6.8  Hemoglobin 12.0 - 15.0 g/dL 12.0 12.7 12.1  Hematocrit 36.0 - 46.0 % 36.2 37.3 35.6(L)  Platelets 150 - 400 K/uL 422(H) 411(H) 416(H)     CMP Latest Ref Rng & Units 04/02/2021 03/21/2021 03/12/2021  Glucose 70 - 99 mg/dL 77 76 83  BUN 8 - 23 mg/dL $Remove'9 12 11  'xStGeHI$ Creatinine 0.44 - 1.00 mg/dL 0.56 0.57 0.52  Sodium 135 - 145 mmol/L 136 138 136  Potassium 3.5 - 5.1 mmol/L 3.9 4.2 4.2  Chloride 98 - 111 mmol/L 96(L) 99 101  CO2 22 - 32 mmol/L $RemoveB'26 26 24  'qOiwbWAs$ Calcium 8.9 - 10.3 mg/dL 9.1 9.4 8.8(L)  Total Protein 6.5 - 8.1 g/dL 6.8 6.9 6.7  Total Bilirubin 0.3 - 1.2 mg/dL 1.0 0.5 0.5  Alkaline Phos 38 - 126 U/L 2,095(H) 240(H) 377(H)  AST 15 - 41 U/L 199(HH) 27 48(H)  ALT 0 - 44 U/L 262(H) 21 49(H)      RADIOGRAPHIC STUDIES: I have personally reviewed the radiological images as listed and agreed with the findings in the report. No results found.   ASSESSMENT & PLAN:  KALLEY NICHOLL is a 70 y.o. female with   1.  Metastatic Lung Adenocarcinoma, stage IV  -Chest CT 12/11/20 showed a large soft tissue mass in the left apical portion of the lung, with direct invasion of the left upper thoracic spine, probable pleural metastasis -Left supraclavicular lymph node biopsy on 12/22/20 confirmed  metastatic adenocarcinoma -PET scan 12/27/20 showed: large hypermetabolic mass occupying left lung apex consistent with recurrent neoplasm; extensive metastatic disease involving chest, abdomen, pelvis, bony structures, muscles, and subcutaneous tissues. -I started her on first-line chemo with Carboplatin and Taxol with Bevacuzimab q3weeks on 01/09/21, which will cover both lung and breast cancer if she has two primaries. Goal of therapy is to control disease and prolong her life. I added Immunotherapy Tecentriq q3weeks from C3D1 chemo on 02/19/21.  -Brain MRI 01/30/21 negative for intracranial mets, but shows a 7 mm right parietal skull met and possible left parotid met vs possible primary neoplasm..  -FO ordered but has not been done due to lack of her insurance approval. I ordered and get Guardant[employer] done today  -She is clinically getting worse, with more pain, weight loss, and decreased performance status, concerning for cancer progression. -She is scheduled for a PET scan on 04/06/21.  -Lab reviewed, she has significant transaminitis, will hold on Taxol today, and proceed with carboplatin, bevacizumab and Tecentriq today.  Patient would like to proceed chemo treatment today, but likely will agree to stop chemo and proceed with supportive care alone if she has cancer progression on PET scan. -will see her back in a week to review PET scan findings.  Her son will come with her next week.  2. Pain in bilateral scapula and ribs secondary to #1 -She has pain to her left scapula, rated 5-6/10 on 12/28/20, at location of metastatic tumor -Dr. Isidore Moos discussed with patient in 11/3020 to hold on palliative Radiation for now. -She tried MS Contin but developed nausea and stopped -Xray from 03/05/2021 revealed minimally displaced right 5th rib fracture. No evidence of pneumothorax or pleural effusion.  -She has restarted her MS Contin 15 mg twice daily, and use oxycodone 5 mg as needed, about twice a day.  I  encouraged her to increase oxycodone to every 6hrs, will refill today    3. Weight loss, Fatigue, Constipation, Secondary to chemo -She has taste change and low appetite from chemo. She has lost 30-35 lbs from 10/2020 to 03/2021 -She has been followed by nutrition, most recently on 03/12/21 -chemo reduced to weekly treatment 2 weeks on/1 week off starting with C2, tolerated better -currently on miralax for constipation -she has lost more weight lately    4. 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 Her 10/2020 mammogram showed a 1.2cm mass in the 11:00 position of her left breast. 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 The anterior resection margin was positive. -given her PET scan findings of multiple subcutaneous and intramuscular metastasis, I suspect this is part of her metastatic lung cancer  -I discussed chemo for her lung met will is also effective regimen for triple negative cancer -Due to her metastatic lung cancer, will hold on adjuvant radiation for now.   5. History of LUL stage IA, T1 a, N0, M0) non-small cell lung cancer,  adenocarcinoma Dx in 2013 + LLL stage Ib (T2, and 0, M0) non-small cell lung cancer, adenocarcinoma Dx 02/2018.   6. COPD and HTN  -She quit smoking in 2013 after she was diagnosed with lung cancer. -She has SOB and uses inhaler daily. Not on oxygen.     7. SVT -following C2D1 carbo/taxol/beva on 5/16 she developed dizziness, SOB, and tachycardia at home on 5/21, called EMS -she was found to be in SVT with HR 210, normalized with adenosine x1 in the field -she was brought to [facility] ED, labs showed  elevated troponins, dehydration. No UTI -she was given IVF, IV fentanyl. Normalized and she was discharged home, no recurrent episodes. -seen by cardiology Dr. Cristopher Peru      Plan -Hold on Taxol, proceeded with [place], bevacizumab and Tecentriq today -She is scheduled for PET scan later this week -refill oxycodone and MS contin  -Follow-up in a week to review PET scan findings, will likely start chemo and discuss hospice on next visit  No problem-specific Assessment & Plan notes found for this encounter.   No orders of the defined types were placed in this encounter.  All questions were answered. The patient knows to call the clinic with any problems, questions or concerns. No barriers to learning was detected. The total time spent in the appointment was 30 minutes.     Truitt Merle, MD 04/02/2021   I, Wilburn Mylar, am acting as scribe for Truitt Merle, MD.   I have reviewed the above documentation for accuracy and completeness, and I agree with the above.

## 2021-04-02 ENCOUNTER — Inpatient Hospital Stay (HOSPITAL_BASED_OUTPATIENT_CLINIC_OR_DEPARTMENT_OTHER): Payer: Medicare Other | Admitting: Hematology

## 2021-04-02 ENCOUNTER — Inpatient Hospital Stay: Payer: Medicare Other

## 2021-04-02 ENCOUNTER — Telehealth: Payer: Self-pay

## 2021-04-02 ENCOUNTER — Encounter: Payer: Self-pay | Admitting: Hematology

## 2021-04-02 ENCOUNTER — Other Ambulatory Visit: Payer: Self-pay

## 2021-04-02 VITALS — BP 108/81 | HR 104 | Temp 98.3°F | Resp 18 | Wt 88.8 lb

## 2021-04-02 DIAGNOSIS — C50212 Malignant neoplasm of upper-inner quadrant of left female breast: Secondary | ICD-10-CM | POA: Diagnosis not present

## 2021-04-02 DIAGNOSIS — C3492 Malignant neoplasm of unspecified part of left bronchus or lung: Secondary | ICD-10-CM

## 2021-04-02 DIAGNOSIS — C3490 Malignant neoplasm of unspecified part of unspecified bronchus or lung: Secondary | ICD-10-CM | POA: Diagnosis not present

## 2021-04-02 DIAGNOSIS — Z171 Estrogen receptor negative status [ER-]: Secondary | ICD-10-CM | POA: Diagnosis not present

## 2021-04-02 DIAGNOSIS — Z95828 Presence of other vascular implants and grafts: Secondary | ICD-10-CM

## 2021-04-02 DIAGNOSIS — C770 Secondary and unspecified malignant neoplasm of lymph nodes of head, face and neck: Secondary | ICD-10-CM | POA: Diagnosis not present

## 2021-04-02 DIAGNOSIS — Z5111 Encounter for antineoplastic chemotherapy: Secondary | ICD-10-CM | POA: Diagnosis not present

## 2021-04-02 DIAGNOSIS — Z5112 Encounter for antineoplastic immunotherapy: Secondary | ICD-10-CM | POA: Diagnosis not present

## 2021-04-02 DIAGNOSIS — C3432 Malignant neoplasm of lower lobe, left bronchus or lung: Secondary | ICD-10-CM | POA: Diagnosis not present

## 2021-04-02 DIAGNOSIS — C7989 Secondary malignant neoplasm of other specified sites: Secondary | ICD-10-CM

## 2021-04-02 DIAGNOSIS — C7951 Secondary malignant neoplasm of bone: Secondary | ICD-10-CM | POA: Diagnosis not present

## 2021-04-02 LAB — CBC WITH DIFFERENTIAL (CANCER CENTER ONLY)
Abs Immature Granulocytes: 0.08 10*3/uL — ABNORMAL HIGH (ref 0.00–0.07)
Basophils Absolute: 0.1 10*3/uL (ref 0.0–0.1)
Basophils Relative: 1 %
Eosinophils Absolute: 0.1 10*3/uL (ref 0.0–0.5)
Eosinophils Relative: 1 %
HCT: 36.2 % (ref 36.0–46.0)
Hemoglobin: 12 g/dL (ref 12.0–15.0)
Immature Granulocytes: 1 %
Lymphocytes Relative: 13 %
Lymphs Abs: 1.3 10*3/uL (ref 0.7–4.0)
MCH: 30.4 pg (ref 26.0–34.0)
MCHC: 33.1 g/dL (ref 30.0–36.0)
MCV: 91.6 fL (ref 80.0–100.0)
Monocytes Absolute: 0.7 10*3/uL (ref 0.1–1.0)
Monocytes Relative: 7 %
Neutro Abs: 7.7 10*3/uL (ref 1.7–7.7)
Neutrophils Relative %: 77 %
Platelet Count: 422 10*3/uL — ABNORMAL HIGH (ref 150–400)
RBC: 3.95 MIL/uL (ref 3.87–5.11)
RDW: 19.5 % — ABNORMAL HIGH (ref 11.5–15.5)
WBC Count: 10 10*3/uL (ref 4.0–10.5)
nRBC: 0 % (ref 0.0–0.2)

## 2021-04-02 LAB — CMP (CANCER CENTER ONLY)
ALT: 262 U/L — ABNORMAL HIGH (ref 0–44)
AST: 199 U/L (ref 15–41)
Albumin: 2.7 g/dL — ABNORMAL LOW (ref 3.5–5.0)
Alkaline Phosphatase: 2095 U/L — ABNORMAL HIGH (ref 38–126)
Anion gap: 14 (ref 5–15)
BUN: 9 mg/dL (ref 8–23)
CO2: 26 mmol/L (ref 22–32)
Calcium: 9.1 mg/dL (ref 8.9–10.3)
Chloride: 96 mmol/L — ABNORMAL LOW (ref 98–111)
Creatinine: 0.56 mg/dL (ref 0.44–1.00)
GFR, Estimated: >60 mL/min (ref >60)
Glucose, Bld: 77 mg/dL (ref 70–99)
Potassium: 3.9 mmol/L (ref 3.5–5.1)
Sodium: 136 mmol/L (ref 135–145)
Total Bilirubin: 1 mg/dL (ref 0.3–1.2)
Total Protein: 6.8 g/dL (ref 6.5–8.1)

## 2021-04-02 LAB — TOTAL PROTEIN, URINE DIPSTICK: Protein, ur: NEGATIVE mg/dL

## 2021-04-02 LAB — TSH: TSH: 1.96 u[IU]/mL (ref 0.308–3.960)

## 2021-04-02 MED ORDER — PROMETHAZINE HCL 25 MG PO TABS: 25.0000 mg | ORAL_TABLET | Freq: Four times a day (QID) | ORAL | 0 refills | Status: AC | PRN

## 2021-04-02 MED ORDER — PALONOSETRON HCL INJECTION 0.25 MG/5ML
0.2500 mg | Freq: Once | INTRAVENOUS | Status: AC
  Administered 2021-04-02: 0.25 mg via INTRAVENOUS

## 2021-04-02 MED ORDER — FAMOTIDINE 20 MG IN NS 100 ML IVPB: 20.0000 mg | Freq: Once | INTRAVENOUS | Status: DC | PRN

## 2021-04-02 MED ORDER — COLD PACK MISC ONCOLOGY
1.0000 | Freq: Once | Status: DC | PRN
  Filled 2021-04-02: qty 1

## 2021-04-02 MED ORDER — SODIUM CHLORIDE 0.9 % IV SOLN
600.0000 mg | Freq: Once | INTRAVENOUS | Status: AC
  Administered 2021-04-02: 600 mg via INTRAVENOUS
  Filled 2021-04-02: qty 16

## 2021-04-02 MED ORDER — SODIUM CHLORIDE 0.9 % IV SOLN
1200.0000 mg | Freq: Once | INTRAVENOUS | Status: AC
  Administered 2021-04-02: 1200 mg via INTRAVENOUS
  Filled 2021-04-02: qty 20

## 2021-04-02 MED ORDER — EPINEPHRINE 0.3 MG/0.3ML IJ SOAJ: 0.3000 mg | Freq: Once | INTRAMUSCULAR | Status: DC | PRN

## 2021-04-02 MED ORDER — MORPHINE SULFATE ER 15 MG PO TBCR: 15.0000 mg | EXTENDED_RELEASE_TABLET | Freq: Two times a day (BID) | ORAL | 0 refills | Status: AC

## 2021-04-02 MED ORDER — SODIUM CHLORIDE 0.9 % IV SOLN
150.0000 mg | Freq: Once | INTRAVENOUS | Status: AC
  Administered 2021-04-02: 150 mg via INTRAVENOUS
  Filled 2021-04-02: qty 150

## 2021-04-02 MED ORDER — HEPARIN SOD (PORK) LOCK FLUSH 100 UNIT/ML IV SOLN
250.0000 [IU] | Freq: Once | INTRAVENOUS | Status: DC | PRN
  Filled 2021-04-02: qty 5

## 2021-04-02 MED ORDER — ALTEPLASE 2 MG IJ SOLR
2.0000 mg | Freq: Once | INTRAMUSCULAR | Status: DC | PRN
  Filled 2021-04-02: qty 2

## 2021-04-02 MED ORDER — FAMOTIDINE 20 MG IN NS 100 ML IVPB
INTRAVENOUS | Status: AC
  Filled 2021-04-02: qty 100

## 2021-04-02 MED ORDER — DIPHENHYDRAMINE HCL 50 MG/ML IJ SOLN
50.0000 mg | Freq: Once | INTRAMUSCULAR | Status: AC | PRN
  Administered 2021-04-02: 50 mg via INTRAVENOUS

## 2021-04-02 MED ORDER — OXYCODONE HCL 5 MG PO TABS: 5.0000 mg | ORAL_TABLET | ORAL | 0 refills | Status: AC | PRN

## 2021-04-02 MED ORDER — SODIUM CHLORIDE 0.9 % IV SOLN
93.7500 mg | Freq: Once | INTRAVENOUS | Status: AC
  Administered 2021-04-02: 90 mg via INTRAVENOUS
  Filled 2021-04-02: qty 9

## 2021-04-02 MED ORDER — SODIUM CHLORIDE 0.9% FLUSH
3.0000 mL | INTRAVENOUS | Status: DC | PRN
  Filled 2021-04-02: qty 10

## 2021-04-02 MED ORDER — SODIUM CHLORIDE 0.9% FLUSH
10.0000 mL | Freq: Once | INTRAVENOUS | Status: AC
  Administered 2021-04-02: 10 mL
  Filled 2021-04-02: qty 10

## 2021-04-02 MED ORDER — PALONOSETRON HCL INJECTION 0.25 MG/5ML
INTRAVENOUS | Status: AC
  Filled 2021-04-02: qty 5

## 2021-04-02 MED ORDER — DIPHENHYDRAMINE HCL 50 MG/ML IJ SOLN
INTRAMUSCULAR | Status: AC
  Filled 2021-04-02: qty 1

## 2021-04-02 MED ORDER — METHYLPREDNISOLONE SODIUM SUCC 125 MG IJ SOLR: 125.0000 mg | Freq: Once | INTRAMUSCULAR | Status: DC | PRN

## 2021-04-02 MED ORDER — SODIUM CHLORIDE 0.9% FLUSH
10.0000 mL | INTRAVENOUS | Status: DC | PRN
  Administered 2021-04-02: 10 mL
  Filled 2021-04-02: qty 10

## 2021-04-02 MED ORDER — HEPARIN SOD (PORK) LOCK FLUSH 100 UNIT/ML IV SOLN
500.0000 [IU] | Freq: Once | INTRAVENOUS | Status: AC | PRN
  Administered 2021-04-02: 500 [IU]
  Filled 2021-04-02: qty 5

## 2021-04-02 MED ORDER — ALBUTEROL SULFATE (2.5 MG/3ML) 0.083% IN NEBU
2.5000 mg | INHALATION_SOLUTION | Freq: Once | RESPIRATORY_TRACT | Status: DC | PRN
  Filled 2021-04-02: qty 3

## 2021-04-02 MED ORDER — OLANZAPINE 5 MG PO TABS: 5.0000 mg | ORAL_TABLET | Freq: Every day | ORAL | 0 refills | Status: AC

## 2021-04-02 MED ORDER — SODIUM CHLORIDE 0.9 % IV SOLN
Freq: Once | INTRAVENOUS | Status: DC | PRN
  Filled 2021-04-02: qty 250

## 2021-04-02 MED ORDER — SODIUM CHLORIDE 0.9 % IV SOLN
Freq: Once | INTRAVENOUS | Status: AC
  Filled 2021-04-02: qty 250

## 2021-04-02 NOTE — Progress Notes (Signed)
Provider aware of labs (elevated liver enzymes) and elevated HR-104. Okay to proceed with Avastin, Tecentriq, and Carboplatin. Taxol to be held today.

## 2021-04-02 NOTE — Patient Instructions (Signed)
[facility]  Discharge Instructions: Thank you for choosing [facility] to provide your oncology and hematology care.   If you have a lab appointment with the [facility], please go directly to the [facility] and check in at the registration area.   Wear comfortable clothing and clothing appropriate for easy access to any Portacath or PICC line.   We strive to give you quality time with your provider. You may need to reschedule your appointment if you arrive late (15 or more minutes).  Arriving late affects you and other patients whose appointments are after yours.  Also, if you miss three or more appointments without notifying the office, you may be dismissed from the clinic at the provider's discretion.      For prescription refill requests, have your pharmacy contact our office and allow 72 hours for refills to be completed.    Today you received the following chemotherapy and/or immunotherapy agents : Avastin, Tecentriq, Carboplatin   To help prevent nausea and vomiting after your treatment, we encourage you to take your nausea medication as directed.  BELOW ARE SYMPTOMS THAT SHOULD BE REPORTED IMMEDIATELY: *FEVER GREATER THAN 100.4 F (38 C) OR HIGHER *CHILLS OR SWEATING *NAUSEA AND VOMITING THAT IS NOT CONTROLLED WITH YOUR NAUSEA MEDICATION *UNUSUAL SHORTNESS OF BREATH *UNUSUAL BRUISING OR BLEEDING *URINARY PROBLEMS (pain or burning when urinating, or frequent urination) *BOWEL PROBLEMS (unusual diarrhea, constipation, pain near the anus) TENDERNESS IN MOUTH AND THROAT WITH OR WITHOUT PRESENCE OF ULCERS (sore throat, sores in mouth, or a toothache) UNUSUAL RASH, SWELLING OR PAIN  UNUSUAL VAGINAL DISCHARGE OR ITCHING   Items with * indicate a potential emergency and should be followed up as soon as possible or go to the Emergency Department if any problems should occur.  Please show the CHEMOTHERAPY ALERT CARD or IMMUNOTHERAPY ALERT  CARD at check-in to the Emergency Department and triage nurse.  Should you have questions after your visit or need to cancel or reschedule your appointment, please contact [facility]  Dept: [phone]  and follow the prompts.  Office hours are 8:00 a.m. to 4:30 p.m. Monday - Friday. Please note that voicemails left after 4:00 p.m. may not be returned until the following business day.  We are closed weekends and major holidays. You have access to a nurse at all times for urgent questions. Please call the main number to the clinic Dept: [phone] and follow the prompts.   For any non-urgent questions, you may also contact your provider using MyChart. We now offer e-Visits for anyone 72 and older to request care online for non-urgent symptoms. For details visit [email or web address].   Also download the MyChart app! Go to the app store, search "MyChart", open the app, select [facility], and log in with your MyChart username and password.  Due to Covid, a mask is required upon entering the hospital/clinic. If you do not have a mask, one will be given to you upon arrival. For doctor visits, patients may have 1 support person aged 8 or older with them. For treatment visits, patients cannot have anyone with them due to current Covid guidelines and our immunocompromised population.

## 2021-04-02 NOTE — Telephone Encounter (Signed)
CRITICAL VALUE STICKER  CRITICAL VALUE: AST [id number]  RECEIVER (on-site recipient of call): Cohen Boettner P. LPN  DATE & TIME NOTIFIED: 04/02/21 9:33  MESSENGER (representative from lab): Ulice Dash  MD NOTIFIED: Dr. Burr Medico  TIME OF NOTIFICATION: 9:35 am

## 2021-04-05 ENCOUNTER — Telehealth: Payer: Self-pay

## 2021-04-05 ENCOUNTER — Other Ambulatory Visit: Payer: Self-pay

## 2021-04-05 NOTE — Telephone Encounter (Signed)
This nurse attempted to reach patient to inform her about PET scan.  No answer, left a message for patient to return call to clinic.

## 2021-04-06 ENCOUNTER — Telehealth: Payer: Self-pay

## 2021-04-06 ENCOUNTER — Ambulatory Visit (HOSPITAL_COMMUNITY): Payer: Medicare Other

## 2021-04-06 ENCOUNTER — Other Ambulatory Visit: Payer: Self-pay

## 2021-04-06 DIAGNOSIS — C3490 Malignant neoplasm of unspecified part of unspecified bronchus or lung: Secondary | ICD-10-CM

## 2021-04-06 MED FILL — Fosaprepitant Dimeglumine For IV Infusion 150 MG (Base Eq): INTRAVENOUS | Qty: 5 | Status: AC

## 2021-04-06 NOTE — Progress Notes (Unsigned)
ct 

## 2021-04-09 ENCOUNTER — Encounter: Payer: Self-pay | Admitting: Nutrition

## 2021-04-09 ENCOUNTER — Ambulatory Visit: Payer: Medicare Other

## 2021-04-09 ENCOUNTER — Encounter (HOSPITAL_COMMUNITY): Payer: Self-pay | Admitting: Hematology

## 2021-04-09 ENCOUNTER — Inpatient Hospital Stay: Payer: Medicare Other | Admitting: Nutrition

## 2021-04-09 ENCOUNTER — Ambulatory Visit: Payer: Medicare Other | Admitting: Hematology

## 2021-04-09 ENCOUNTER — Other Ambulatory Visit: Payer: Medicare Other

## 2021-04-09 NOTE — Progress Notes (Signed)
Nutrition follow up cancelled because oncology treatment was cancelled.

## 2021-04-11 ENCOUNTER — Ambulatory Visit (HOSPITAL_COMMUNITY)
Admission: RE | Admit: 2021-04-11 | Discharge: 2021-04-11 | Disposition: A | Discharge status: Home / Self Care | Payer: Medicare Other | Source: Ambulatory Visit | Attending: Hematology | Admitting: Hematology

## 2021-04-11 ENCOUNTER — Other Ambulatory Visit: Payer: Self-pay

## 2021-04-11 DIAGNOSIS — C3490 Malignant neoplasm of unspecified part of unspecified bronchus or lung: Secondary | ICD-10-CM

## 2021-04-11 DIAGNOSIS — N200 Calculus of kidney: Secondary | ICD-10-CM | POA: Diagnosis not present

## 2021-04-11 DIAGNOSIS — K8689 Other specified diseases of pancreas: Secondary | ICD-10-CM | POA: Diagnosis not present

## 2021-04-11 DIAGNOSIS — Z5111 Encounter for antineoplastic chemotherapy: Secondary | ICD-10-CM | POA: Diagnosis not present

## 2021-04-11 DIAGNOSIS — I2510 Atherosclerotic heart disease of native coronary artery without angina pectoris: Secondary | ICD-10-CM | POA: Diagnosis not present

## 2021-04-11 DIAGNOSIS — J439 Emphysema, unspecified: Secondary | ICD-10-CM | POA: Diagnosis not present

## 2021-04-11 DIAGNOSIS — K7689 Other specified diseases of liver: Secondary | ICD-10-CM | POA: Diagnosis not present

## 2021-04-11 MED ORDER — IOHEXOL 350 MG/ML SOLN
100.0000 mL | Freq: Once | INTRAVENOUS | Status: AC | PRN
  Administered 2021-04-11: 75 mL via INTRAVENOUS

## 2021-04-11 MED ORDER — SODIUM CHLORIDE (PF) 0.9 % IJ SOLN
INTRAMUSCULAR | Status: AC
  Filled 2021-04-11: qty 50

## 2021-04-12 ENCOUNTER — Inpatient Hospital Stay (HOSPITAL_BASED_OUTPATIENT_CLINIC_OR_DEPARTMENT_OTHER): Payer: Medicare Other | Admitting: Hematology

## 2021-04-12 ENCOUNTER — Encounter: Payer: Self-pay | Admitting: Hematology

## 2021-04-12 ENCOUNTER — Other Ambulatory Visit: Payer: Self-pay

## 2021-04-12 VITALS — BP 98/75 | HR 120 | Temp 97.9°F | Resp 18 | Ht 64.0 in | Wt 88.4 lb

## 2021-04-12 DIAGNOSIS — C3432 Malignant neoplasm of lower lobe, left bronchus or lung: Secondary | ICD-10-CM | POA: Diagnosis not present

## 2021-04-12 DIAGNOSIS — C50212 Malignant neoplasm of upper-inner quadrant of left female breast: Secondary | ICD-10-CM | POA: Diagnosis not present

## 2021-04-12 DIAGNOSIS — Z171 Estrogen receptor negative status [ER-]: Secondary | ICD-10-CM | POA: Diagnosis not present

## 2021-04-12 DIAGNOSIS — C7951 Secondary malignant neoplasm of bone: Secondary | ICD-10-CM | POA: Diagnosis not present

## 2021-04-12 DIAGNOSIS — C3492 Malignant neoplasm of unspecified part of left bronchus or lung: Secondary | ICD-10-CM | POA: Diagnosis not present

## 2021-04-12 DIAGNOSIS — Z5112 Encounter for antineoplastic immunotherapy: Secondary | ICD-10-CM | POA: Diagnosis not present

## 2021-04-12 DIAGNOSIS — C770 Secondary and unspecified malignant neoplasm of lymph nodes of head, face and neck: Secondary | ICD-10-CM | POA: Diagnosis not present

## 2021-04-12 DIAGNOSIS — Z5111 Encounter for antineoplastic chemotherapy: Secondary | ICD-10-CM | POA: Diagnosis not present

## 2021-04-12 MED ORDER — DRONABINOL 2.5 MG PO CAPS: 2.5000 mg | ORAL_CAPSULE | Freq: Two times a day (BID) | ORAL | 0 refills | Status: AC

## 2021-04-12 NOTE — Progress Notes (Addendum)
[place]   [phone] [phone]   Clinic Follow up Note   Patient Care Team: [name], MD as PCP - General (Family Medicine) Collene Gobble, MD (Pulmonary Disease) Erroll Luna, MD as Consulting Physician (General Surgery) Truitt Merle, MD as Consulting Physician (Hematology)  Date of Service:  04/12/2021  CHIEF COMPLAINT: f/u of metastatic lung cancer  SUMMARY OF ONCOLOGIC HISTORY: Oncology History Overview Note  Cancer Staging Malignant neoplasm of upper-inner quadrant of left breast in female, estrogen receptor negative ([place]) Staging form: Breast, AJCC 8th Edition - Clinical stage from 11/01/2020: Stage IB (cT1c, cN0, cM0, G3, ER-, PR-, HER2-) - Signed by Truitt Merle, MD on 11/08/2020 Stage prefix: Initial diagnosis - Pathologic stage from 11/21/2020: Stage IB (pT1c, pN0, cM0, G3, ER-, PR-, HER2-) - Signed by [name], NP on 12/20/2020 Stage prefix: Initial diagnosis Histologic grading system: 3 grade system  Non-small cell lung cancer ([place]) Staging form: Lung, AJCC 7th Edition - Clinical: Stage IA (Free text: Ia) - Signed by [name], MD on 12/28/2013 Laterality: Left Cancer stage: Ia - Pathologic: Ia - Signed by [name], MD on 12/28/2013 Laterality: Left Cancer stage: Ia    Non-small cell lung cancer ([place])  09/02/2011 Imaging   CT Chest  IMPRESSION:   1.  Interval clearing of right upper lobe pneumonia.  2.  Left upper lobe nodule is unchanged in the short interval from  07/29/2011.  Follow-up could be performed in 3 months to ensure  continued stability. This recommendation follows the consensus  statement: Guidelines for Management of Small Pulmonary Nodules Detected on CT Scans:  A Statement from the [employer] as published in Radiology 2005; 237:395-400.  Available online at: https://www.arnold.com/.  3.  Additional scattered pulmonary nodules  can be reevaluated on  future imaging as well.    11/06/2011 Imaging   PET IMPRESSION:   1.  Mild hypermetabolism which projects minimally cephalad to, but is felt to correspond to the left upper lobe lung nodule.  Although not within malignant range, given the small size of the nodule, malignancy cannot be excluded.  Possible mild interval enlargement of the nodule since 09/02/2011.  Consider repeat standard chest CT to confirm interval enlargement.  Especially if interval enlargement has occurred, tissue sampling would be suggested. Alternatively, 52-monthfollow-up chest CT could be performed.  2.  No evidence of thoracic nodal or extrathoracic disease.  3. Vague hypermetabolism corresponding to a prominent right lobe of the thyroid. Nonspecific.  Consider ultrasound correlation.   12/17/2011 Surgery   Thoracoscopy by Dr HRoxan Hockey  Diagnosis 1. Lung, wedge biopsy/resection, left upper lobe - INVASIVE WELL-DIFFERENTIATED ADENOCARCINOMA, SPANNING 1.2 CM. - NO LYMPH VASCULAR INVASION IDENTIFIED. - PLEURA IS UNINVOLVED. - MARGINS ARE NEGATIVE. - SEE ONCOLOGY TEMPLATE. 2. Lymph node, biopsy, level 9 (2) - BENIGN LYMPH NODES WITH NO TUMOR SEEN (0/2). 3. Lymph node, biopsy, 11 (3) - BENIGN LYMPH NODES WITH NO TUMOR SEEN (0/3). 4. Lymph node, biopsy, 5 - ONE BENIGN LYMPH NODE WITH NO TUMOR SEEN (0/1). 5. Lymph node, biopsy, 10 - ONE BENIGN LYMPH NODE WITH NO TUMOR SEEN (0/1). 6. Lung, resection (segmental or lobe), Remainder of left upper lobe - BENIGN LUNG PARENCHYMA WITH CHRONIC INFLAMMATION AND INCREASED PULMONARY MACROPHAGES. - THREE BENIGN LYMPH NODES IDENTIFIED (0/3). - NO TUMOR SEEN.    01/08/2012 Initial Diagnosis   Non-small cell lung cancer ([place]   02/10/2018 Imaging   CT Chest IMPRESSION: 1. Enlarging irregular pleural-based solid  1.9 cm pulmonary nodule in the superior segment left lower lobe, suspicious for malignancy, either recurrent disease or a metachronous primary  bronchogenic carcinoma. Consider PET-CT for further characterization. 2. Additional scattered bilateral pulmonary nodules are stable and considered benign. 3. No noncontrast CT evidence of thoracic adenopathy. 4. Three-vessel coronary atherosclerosis. 5. Nonobstructing left nephrolithiasis.   Aortic Atherosclerosis (ICD10-I70.0) and Emphysema (ICD10-J43.9).   02/18/2018 PET scan   IMPRESSION: 1. Hypermetabolic apical nodule in the superior segment left lower lobe is highly worrisome for bronchogenic carcinoma. 2. No additional areas of abnormal hypermetabolism in the neck, chest, abdomen or pelvis. 3. Aortic atherosclerosis (ICD10-170.0). Coronary artery calcification. 4.  Emphysema (ICD10-J43.9). 5. Left renal stone.     03/06/2018 Pathology Results   Lung Biopsy  Diagnosis Lung, needle/core biopsy(ies), Left Lower Lobe - ADENOCARCINOMA. Microscopic Comment There is likely sufficient tissue for additional studies if requested (block 1B). Dr. Lyndon Code has reviewed the case.   04/08/2018 Surgery   Left Video assisted Thoracoscopy by Dr Roxan Hockey  Diagnosis Lung, wedge biopsy/resection, Left Lower Lobe - INVASIVE ADENOCARCINOMA, MODERATELY DIFFERENTIATED, SPANNING 1.3 CM. - ADENOCARCINOMA INVOLVES VISCERAL PLEURA. - ONE BENIGN INTRAPULMONARY LYMPH NODE (0/1). - LYMPHOVASCULAR INVASION IS IDENTIFIED, FOCAL. - THE SURGICAL RESECTION MARGINS ARE NEGATIVE FOR CARCINOMA. - SEE ONCOLOGY TABLE BELOW.    08/01/2020 Imaging   CT Chest  IMPRESSION: Stable postop changes from prior left upper lobectomy. Stable small bilateral pulmonary nodules. No new or progressive disease within the thorax.   12/11/2020 Imaging   CT Chest  IMPRESSION: 1. Signs of tumor recurrence within the apical portion of the left hemithorax with large rind of increased soft tissue. Tumor extends into the left upper thoracic spine paravertebral soft tissues. 2. Pleural spread of disease is noted with mild  progressive pleural thickening overlying the posterior and lateral left lower lung. 3. Interval development of increased interstitial markings within the left upper lobe adjacent to mass. In the absence of interval external beam radiation (since 08/01/2020) this is suspicious for lymphangitic spread of tumor. 4. Progressive soft tissue mass the within the paravertebral right upper lobe concerning for metastatic disease. 5. New enlarged anterior mediastinal and left supraclavicular lymph nodes compatible with metastatic adenopathy. 6. Indeterminate soft tissue attenuating nodular density between the upper pole of left kidney and left adrenal gland. Cannot exclude nodal metastasis or adrenal gland metastasis. 7. Aortic atherosclerosis. (ICD10-I70.0).   12/11/2020 Relapse/Recurrence      12/27/2020 Imaging   PET scan IMPRESSION: 1. Large hypermetabolic mass occupying the left lung apex consistent with recurrent neoplasm. 2. Extensive metastatic disease involving the chest, abdomen, pelvis, bony structures, muscles and subcutaneous tissues.   Malignant neoplasm of upper-inner quadrant of left breast in female, estrogen receptor negative ([place])  10/18/2020 Mammogram   Mammogram 10/18/20 at [name] FINDINGS:  Cc and MLO views of bilateral breasts are submitted. There is a  spiculated mass in the palpable area upper left breast. The right  breast is negative.   Targeted ultrasound is performed, showing 1.2 x 0.9 x 1.2 cm  spiculated hypoechoic mass at the left breast 11 o'clock 8 cm from  nipple palpable area. This correlates to the mammographic mass.  Ultrasound of the left axilla is negative.   IMPRESSION:  Highly suspicious findings.     10/26/2020 Initial Biopsy   Final Pathologic Diagnosis  10/26/20 at [facility] BREAST, LEFT 1:00 8 CM FROM NIPPLE, NEEDLE BIOPSY:      -INVASIVE DUCTAL CARCINOMA, NOTTINGHAM GRADE 3.  Prognostic Markers in Cancer  -Estrogen  Receptor:  Negative (<1%)   -Progesterone Receptor:  Negative (<1%)  -Her2:  Negative (score=1+)     Ki-67:  Percentage of tumor cells with nuclear positivity: 60 %      11/01/2020 Cancer Staging   Staging form: Breast, AJCC 8th Edition - Clinical stage from 11/01/2020: Stage IB (cT1c, cN0, cM0, G3, ER-, PR-, HER2-) - Signed by Truitt Merle, MD on 11/08/2020  Stage prefix: Initial diagnosis    11/08/2020 Initial Diagnosis   Malignant neoplasm of upper-inner quadrant of left breast in female, estrogen receptor negative ([place])   11/21/2020 Surgery   LEFT BREAST LUMPECTOMY WITH RADIOACTIVE SEED AND LEFT SENTINEL LYMPH NODE [place] and PAC placement by Dr Brantley Stage    11/21/2020 Pathology Results   FINAL MICROSCOPIC DIAGNOSIS:   A. BREAST, LEFT, LUMPECTOMY:  - Invasive ductal carcinoma, grade 3, 1.3 cm  - The anterior resection margin is widely involved by carcinoma  - Carcinoma is 0.2 cm from posterior margin  - Biopsy site changes  - See oncology table   B. BREAST, LEFT ADDITIONAL SUPERIOR MARGIN, EXCISION:  - Unremarkable fibroadipose tissue, negative for carcinoma   C. BREAST, LEFT ADDITIONAL LATERAL MARGIN, EXCISION:  - Unremarkable fibroadipose tissue, negative for carcinoma   D. BREAST, LEFT ADDITIONAL INFEROMEDIAL MARGIN, EXCISION:  - Unremarkable fibroadipose tissue, negative for carcinoma   E. LYMPH NODE, LEFT AXILLARY, SENTINEL, EXCISION:  - Lymph node, negative for carcinoma (0/1)    11/21/2020 Cancer Staging   Staging form: Breast, AJCC 8th Edition - Pathologic stage from 11/21/2020: Stage IB (pT1c, pN0, cM0, G3, ER-, PR-, HER2-) - Signed by [name], NP on 12/20/2020  Stage prefix: Initial diagnosis  Histologic grading system: 3 grade system    Metastatic lung cancer (metastasis from lung to other site), left ([place])  12/11/2020 Imaging   CT Chest  IMPRESSION: 1. Signs of tumor recurrence within the apical portion of the left hemithorax with large rind of increased soft tissue. Tumor  extends into the left upper thoracic spine paravertebral soft tissues. 2. Pleural spread of disease is noted with mild progressive pleural thickening overlying the posterior and lateral left lower lung. 3. Interval development of increased interstitial markings within the left upper lobe adjacent to mass. In the absence of interval external beam radiation (since 08/01/2020) this is suspicious for lymphangitic spread of tumor. 4. Progressive soft tissue mass the within the paravertebral right upper lobe concerning for metastatic disease. 5. New enlarged anterior mediastinal and left supraclavicular lymph nodes compatible with metastatic adenopathy. 6. Indeterminate soft tissue attenuating nodular density between the upper pole of left kidney and left adrenal gland. Cannot exclude nodal metastasis or adrenal gland metastasis. 7. Aortic atherosclerosis. (ICD10-I70.0).   12/22/2020 Pathology Results   A. LYMPH NODE, LEFT NECK, NEEDLE CORE BIOPSY:  - Metastatic adenocarcinoma.   COMMENT:  CK7 is positive.  CK20, TTF-1, Napsin-A, CDX-2, PAX 8, GATA-3 and ER are negative.  The limited CK7 positive staining is compatible with origin from lung; however, it does not exclude origin from other entities. If applicable, there is likely sufficient tissue for ancillary studies.    12/27/2020 Imaging   PET scan IMPRESSION: 1. Large hypermetabolic mass occupying the left lung apex consistent with recurrent neoplasm. 2. Extensive metastatic disease involving the chest, abdomen, pelvis, bony structures, muscles and subcutaneous tissues.   01/07/2021 Initial Diagnosis   Metastatic lung cancer (metastasis from lung to other site), left ([place])   01/08/2021 -  Chemotherapy   First-line Carboplatin and Taxol with Bevacuzimab  q3weeks starting 01/08/21. Due to significant fatigue and weight loss, reduced chemo to 2 weeks on/1 week off treatment starting with C2 on 01/29/21.  --Added Tecentriq from C3D1 (02/22/21).     01/30/2021 Imaging   MRI Brain  IMPRESSION: 1. No evidence of intracranial metastatic disease. 2. A 7 mm rim enhancing lesion in the right parietal bone may represent bone metastasis. 3. A 2.4 heterogeneously enhancing lesion within the deep lobe of the left parotid gland may represent metastasis versus primary parotid neoplasm. 4. Diffuse decrease of the T1 signal within the visualized spine may represent red marrow reconversion. However, marrow replacement pathologies cannot be entirely excluded.     02/19/2021 -  Chemotherapy   Added Tecentriq q3weeks on 02/19/21 (In addition to CT from C3)   04/11/2021 Imaging   CT CAP  IMPRESSION: 1. Mixed response to therapy within the chest, with decrease in size of left apical infiltrative recurrence . Increase in thoracic and decrease in left supraclavicular adenopathy. 2. Progressive osseous metastasis. 3. Improvement in abdominal metastasis including bilateral adrenal lesions and abdominal adenopathy. 4. Development of biliary and pancreatic duct dilatation, both continuing to the level of the pancreatic head. Cannot exclude an occult pancreatic lesion, either metastasis or adenocarcinoma. Consider referral for biliary drainage. 5. Trace nonspecific pelvic fluid.      CURRENT THERAPY:  Supportive care  INTERVAL HISTORY:  Valerie Mosley was contacted for a follow up of metastatic lung cancer. She was last seen by me on 04/02/21. She presented to the clinic accompanied by her husband. She notes her son is involved but could not come back with her due to our current one-visitor limit. She reports increased numbness to her toes. She reports her back ribcage pain, which is right above her waist line and is also present around her sides, is about an 8/10. She reports she takes morphine and oxycodone first thing in the morning. She also notes pain to her tailbone when she sits. She notes an occasional cough that produces white phlegm. This is  not bothersome; she does not feel she needs a medication.   All other systems were reviewed with the patient and are negative.  MEDICAL HISTORY:  Past Medical History:  Diagnosis Date   Breast cancer ([place])    Cancer ([place])    Stage IA non-small cell lung cancer, left upper lobectomy 12/2011   COPD (chronic obstructive pulmonary disease) ([facility])    Cough 07/29/11   started coughing up blood this am   Cough    Hypertension    Kyphoscoliosis    Pneumonia    PONV (postoperative nausea and vomiting)    Recurrent upper respiratory infection (URI)     SURGICAL HISTORY: Past Surgical History:  Procedure Laterality Date   BREAST LUMPECTOMY WITH RADIOACTIVE SEED AND SENTINEL LYMPH NODE BIOPSY Left 11/21/2020   Procedure: LEFT BREAST LUMPECTOMY WITH RADIOACTIVE SEED AND LEFT SENTINEL LYMPH NODE [place];  Surgeon: Erroll Luna, MD;  Location: [facility];  Service: General;  Laterality: Left;  PEC BLOCK; START TIME OF 11:00 AM FOR 90 MINUTES IN ROOM 2   BRONCHOSCOPY     CATARACT EXTRACTION, BILATERAL  2016   EYE SURGERY     INSERTION OF IBV VALVE Left 04/16/2018   Procedure: INSERTION OF INTERBRONCHIAL VALVE (IBV);  Surgeon: [name], MD;  Location: [facility];  Service: Thoracic;  Laterality: Left;   INSERTION OF IBV VALVE N/A 06/25/2018   Procedure: REMOVAL OF THREE INTERBRONCHIAL VALVE (IBV);  Surgeon: Mod[place] Charon  C, MD;  Location: [place];  Service: Thoracic;  Laterality: N/A;   LOBECTOMY  12/17/2011   Procedure: LOBECTOMY;  Surgeon: [name], MD;  Location: [place];  Service: Thoracic;  Laterality: Left;  (L)VATS, WEDGE RESECTION, LEFT UPPER LOBECTOMY,  Multiple node biopsies   PORTACATH PLACEMENT Left 11/21/2020   Procedure: INSERTION PORT-A-CATH;  Surgeon: Erroll Luna, MD;  Location: [place];  Service: General;  Laterality: Left;   REMOVAL OF PLEURAL DRAINAGE CATHETER Left 05/06/2018   Procedure: REMOVAL OF CHEST TUBE;  Surgeon: [name], MD;  Location: [place];   Service: Thoracic;  Laterality: Left;   THYROID SURGERY  1980   1/2 thyroid removed - benign   URETHRA SURGERY     VIDEO ASSISTED THORACOSCOPY Left 04/08/2018   Procedure: REDO LEFT VIDEO ASSISTED THORACOSCOPY with left lower lobe wedge resection;  Surgeon: [name], MD;  [facility];  Service: Thoracic;  Laterality: Left;   VIDEO BRONCHOSCOPY N/A 04/16/2018   Procedure: VIDEO BRONCHOSCOPY;  Surgeon: [name], MD;  Location: [place];  Service: Thoracic;  Laterality: N/A;   VIDEO BRONCHOSCOPY N/A 06/25/2018   Procedure: VIDEO BRONCHOSCOPY;  Surgeon: [name], MD;  Location: [place];  Service: Thoracic;  Laterality: N/A;    I have reviewed the social history and family history with the patient and they are unchanged from previous note.  ALLERGIES:  has No Known Allergies.  MEDICATIONS:  Current Outpatient Medications  Medication Sig Dispense Refill   dronabinol (MARINOL) 2.5 MG capsule Take 1 capsule (2.5 mg total) by mouth 2 (two) times daily before lunch and supper. 60 capsule 0   acetaminophen (TYLENOL) 500 MG tablet Take 1,000 mg by mouth every 6 (six) hours as needed for moderate pain or headache.     acyclovir ointment (ZOVIRAX) 5 % Apply 1 application topically every 3 (three) hours as needed. 15 g 0   albuterol (VENTOLIN HFA) 108 (90 Base) MCG/ACT inhaler Inhale 2 puffs into the lungs every 4 (four) hours as needed for wheezing or shortness of breath. For shortness of breath 8.51 g 5   BEVESPI AEROSPHERE 9-4.8 MCG/ACT AERO INHALE 2 PUFFS INTO THE LUNGS 2 TIMES DAILY. (Patient taking differently: Inhale 2 puffs into the lungs as needed (wheezing, shortness of breath).) 10.7 g 11   dexamethasone (DECADRON) 4 MG tablet Take 2 tablets (8 mg total) by mouth daily. Start the day after carboplatin chemotherapy for 3 days. (Patient taking differently: Take 4 mg by mouth See admin instructions. 4 mg 2 days after chemo.) 30 tablet 1   lidocaine-prilocaine  (EMLA) cream Apply to affected area once (Patient taking differently: Apply 1 application topically See admin instructions. Apply to affected area or port before/after treatment.) 30 g 3   montelukast (SINGULAIR) 10 MG tablet Take 1 tablet (10 mg total) by mouth at bedtime. 30 tablet 5   morphine (MS CONTIN) 15 MG 12 hr tablet Take 1 tablet (15 mg total) by mouth every 12 (twelve) hours. 60 tablet 0   OLANZapine (ZYPREXA) 5 MG tablet Take 1 tablet (5 mg total) by mouth at bedtime. 30 tablet 0   ondansetron (ZOFRAN) 8 MG tablet Take 1 tablet (8 mg total) by mouth 2 (two) times daily as needed for refractory nausea / vomiting. Start on day 3 after carboplatin chemo. 30 tablet 1   oxyCODONE (OXY IR/ROXICODONE) 5 MG immediate release tablet Take 1 tablet (5 mg total) by mouth every 4 (four) hours as needed for  severe pain. 90 tablet 0   prochlorperazine (COMPAZINE) 10 MG tablet Take 1 tablet (10 mg total) by mouth every 6 (six) hours as needed (Nausea or vomiting). 30 tablet 1   promethazine (PHENERGAN) 25 MG tablet Take 1 tablet (25 mg total) by mouth every 6 (six) hours as needed for nausea or vomiting. 30 tablet 0   No current facility-administered medications for this visit.    PHYSICAL EXAMINATION: ECOG PERFORMANCE STATUS: 3 - Symptomatic, >50% confined to bed Vitals:   04/12/21 1623  BP: 98/75  Pulse: (!) 120  Resp: 18  Temp: 97.9 F (36.6 C)  SpO2: 92%    Wt Readings from Last 3 Encounters:  04/12/21 88 lb 6.4 oz (40.1 kg)  04/02/21 88 lb 12.8 oz (40.3 kg)  03/21/21 90 lb 12.8 oz (41.2 kg)    GENERAL:alert, no distress and comfortable SKIN: skin color normal, no rashes or significant lesions EYES: normal, Conjunctiva are pink and non-injected, sclera clear  NEURO: alert & oriented x 3 with fluent speech  LABORATORY DATA:  I have reviewed the data as listed CBC Latest Ref Rng & Units 04/02/2021 03/21/2021 03/12/2021  WBC 4.0 - 10.5 K/uL 10.0 8.7 6.8  Hemoglobin 12.0 - 15.0 g/dL  12.0 12.7 12.1  Hematocrit 36.0 - 46.0 % 36.2 37.3 35.6(L)  Platelets 150 - 400 K/uL 422(H) 411(H) 416(H)     CMP Latest Ref Rng & Units 04/02/2021 03/21/2021 03/12/2021  Glucose 70 - 99 mg/dL 77 76 83  BUN 8 - 23 mg/dL _0 Creatinine 0.44 - 1.00 mg/dL 0.56 0.57 0.52  Sodium 135 - 145 mmol/L 136 138 136  Potassium 3.5 - 5.1 mmol/L 3.9 4.2 4.2  Chloride 98 - 111 mmol/L 96(L) 99 101  CO2 22 - 32 mmol/L _1 Calcium 8.9 - 10.3 mg/dL 9.1 9.4 8.8(L)  Total Protein 6.5 - 8.1 g/dL 6.8 6.9 6.7  Total Bilirubin 0.3 - 1.2 mg/dL 1.0 0.5 0.5  Alkaline Phos 38 - 126 U/L 2,095(H) 240(H) 377(H)  AST 15 - 41 U/L 199(HH) 27 48(H)  ALT 0 - 44 U/L 262(H) 21 49(H)      RADIOGRAPHIC STUDIES: I have personally reviewed the radiological images as listed and agreed with the findings in the report. CT CHEST ABDOMEN PELVIS W CONTRAST  Result Date: 04/12/2021 CLINICAL DATA:  Non-small-cell lung cancer diagnosed multiple times, most recent 4/22. Chemotherapy completed last Monday. History of left upper lobectomy in 2019. EXAM: CT CHEST, ABDOMEN, AND PELVIS WITH CONTRAST TECHNIQUE: Multidetector CT imaging of the chest, abdomen and pelvis was performed following the standard protocol during bolus administration of intravenous contrast. CONTRAST:  73m OMNIPAQUE IOHEXOL 350 MG/ML SOLN COMPARISON:  PET 12/27/2020. Chest CT 12/11/2020. No recent diagnostic abdominopelvic CTs. FINDINGS: CT CHEST FINDINGS Cardiovascular: Aortic atherosclerosis. Normal heart size, without pericardial effusion. Multivessel coronary artery atherosclerosis. Right Port-A-Cath tip low SVC. No central pulmonary embolism, on this non-dedicated study. Mediastinum/Nodes: Left supraclavicular adenopathy at 1.5 cm on 14/2 (Previously 2.4 cm.) A left mediastinal node of 9 mm on 22/2 may have enlarged since the prior. No hilar adenopathy. Prevascular necrotic node measures 1.5 cm on 30/2 (Previously 1.3 cm.) Lungs/Pleura: Trace left pleural fluid  is unchanged. Amorphous soft tissue at the apex of the left hemithorax is felt to be decreased. For example, there is increased aerated lung adjacent to apical soft tissue thickening of 2.7 cm on 12/02. When measured in a similar fashion on 12/11/2020, 3.6 cm. Status post left upper  lobectomy.  Advanced bullous emphysema. Right upper lobe pulmonary nodule of 5 mm on 73/4 is similar to 6 mm on 57/3 of 12/11/2020 CT. Presumed lymphangitic tumor spread in the left apex is felt to be improved, including on 40/4. Right paravertebral/medial pleural mass measures 4.1 x 1.5 cm on 20/2 versus similar on 12/11/2020 (when remeasured). Musculoskeletal: Lateral left eighth rib osseous metastasis with soft tissue thickening on 34/2 is new since the prior CT. Suspect underlying pathologic fracture. Fifth posterolateral right rib expansile metastasis is new since the prior CT. Lytic lesion within the inferior portion of T10 on sagittal image 87 is new since the prior diagnostic CT. CT ABDOMEN PELVIS FINDINGS Hepatobiliary: Multiple hepatic cysts. Development of marked gallbladder and biliary duct dilatation since the prior PET. Example right intrahepatic duct at 1.2 cm on 66/2. The common duct measures 2.2 cm in the porta hepatis on coronal image 45 tapers minimally in the pancreatic head to 9 mm on 51/5. Pancreas: Development of pancreatic duct dilatation within the tail and body. Followed to the level of the pancreatic head, including on 72/2. Spleen: Normal in size, without focal abnormality. Adrenals/Urinary Tract: Right adrenal thickening and nodularity are felt to be minimally improved. Improved left adrenal nodularity, including at 1.4 cm on 55/2 (Previously 1.7 cm.) Left renal cortical thinning with a lower pole left renal collecting system 7 mm stone. Too small to characterize lesions in both kidneys. Normal urinary bladder. Stomach/Bowel: Normal stomach, without wall thickening. Normal caliber of abdominopelvic bowel  loops. Vascular/Lymphatic: Aortic atherosclerosis. No residual abdominal retroperitoneal adenopathy. A right perinephric nodule measures 9 mm on 81/2 and is similar to on the prior. No pelvic sidewall adenopathy. Reproductive: Uterine atrophy or partial hysterectomy. No adnexal mass. Other: Trace pelvic fluid.  No free intraperitoneal air. Musculoskeletal: Metastasis involving the left L2 transverse process including at 2.7 x 2.1 cm on 61/2 is new on CT since the 12/27/2020 PET. Lytic lesion involving the right pedicle of L4 is new since the prior PET. No vertebral body height loss. IMPRESSION: 1. Mixed response to therapy within the chest, with decrease in size of left apical infiltrative recurrence . Increase in thoracic and decrease in left supraclavicular adenopathy. 2. Progressive osseous metastasis. 3. Improvement in abdominal metastasis including bilateral adrenal lesions and abdominal adenopathy. 4. Development of biliary and pancreatic duct dilatation, both continuing to the level of the pancreatic head. Cannot exclude an occult pancreatic lesion, either metastasis or adenocarcinoma. Consider referral for biliary drainage. 5. Trace nonspecific pelvic fluid. Electronically Signed   By: Abigail Miyamoto M.D.   On: 04/12/2021 15:57     ASSESSMENT & PLAN:  Valerie Mosley is a 70 y.o. female with   1. Metastatic Lung Adenocarcinoma, stage IV  -Chest CT 12/11/20 showed a large soft tissue mass in the left apical portion of the lung, with direct invasion of the left upper thoracic spine, probable pleural metastasis -Left supraclavicular lymph node biopsy on 12/22/20 confirmed metastatic adenocarcinoma. PET scan showed diffuse metastatic disease.  --I started her on first-line chemo with Carboplatin and Taxol with Bevacuzimab q3weeks on 01/09/21, which will cover both lung and breast cancer if she has two primaries. Goal of therapy is to control disease and prolong her life. I  -She is clinically getting worse,  with more pain, weight loss, and decreased performance status, concerning for cancer progression. -I personally reviewed her CT CAP from 04/11/21 with pt and her husband, which showed mixed response-- improvement in left apical infiltrative recurrence, left  supraclavicular adenopathy, and abdominal metastasis, but progression in thoracic adenopathy and osseous metastasis. Development of biliary and pancreatic duct dilatation also noted. -Given her significant decline and no clinical benefit from chemo, cancer progression on scan, her prognosis is guarded, her life expectancy is likely a few months.  We discussed discontinuing chemo and moving to supportive care with palliative care and hospice. She is in agreement.  I discussed the home care in a transition to hospice service with him in detail.  I reviewed the options with them. I will refer them to [facility] urgently.  2. Pain in bilateral scapula and ribs secondary to #1 -She has pain to her left scapula, rated 5-6/10 on 12/28/20, at location of metastatic tumor -Dr. Isidore Moos discussed with patient in 11/3020 to hold on palliative Radiation for now. We reviewed again 04/12/21. We will refer her back when the pain is no longer controlled with medicine. -She tried MS Contin but developed nausea and stopped -Xray from 03/05/2021 revealed minimally displaced right 5th rib fracture. No evidence of pneumothorax or pleural effusion.  -She has restarted her MS Contin 15 mg twice daily, and use oxycodone 5 mg as needed, about twice a day.  I encouraged her to increase oxycodone to every 4hrs -I would increase her MS Contin to 30 mg twice daily   3. Weight loss, Fatigue, Constipation, Secondary to chemo -She has taste change and low appetite from chemo. She has lost 30-35 lbs from 10/2020 to 03/2021. She requests a medication to stimulate her appetite today. I prescribed marinol for her today. -She has been followed by nutrition, most recently on 03/12/21 -chemo  reduced to weekly treatment 2 weeks on/1 week off starting with C2, tolerated better -currently on miralax for constipation -weight has been stable the last 10 days since stopping treatment.   4. 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 Her 10/2020 mammogram showed a 1.2cm mass in the 11:00 position of her left breast. 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 The anterior resection margin was positive. -given her PET scan findings of multiple subcutaneous and intramuscular metastasis, I suspect this is part of her metastatic lung cancer    5. History of LUL stage IA, T1 a, N0, M0) non-small cell lung cancer, adenocarcinoma Dx in 2013 + LLL stage Ib (T2, and 0, M0) non-small cell lung cancer, adenocarcinoma Dx 02/2018.   6. COPD and HTN  -She quit smoking in 2013 after she was diagnosed with lung cancer. -She has SOB and uses inhaler daily. Not on oxygen.     7.  Goal of care discussion, DNR  -We again discussed the incurable nature of her cancer, and the overall poor prognosis, especially if she does not have good response to chemotherapy or progress on chemo -The patient understands the goal of care is palliative. -I recommend DNR/DNI, she agrees     Plan -I prescribed marinol for appetite stimulation -I will increase her MS Contin from 15 mg to 30 mg twice daily, she will continue oxycodone 5 mg every 4 hours as needed -urgent referral to [facility] for home hospice care, she is also interested in residential hospice when she needs -f/u as  needed, I will remain to be her attending when she is under hospice  care.    No problem-specific Assessment & Plan notes found for this encounter.   Orders Placed This Encounter  Procedures   Ambulatory referral to Hospice    Referral Priority:   Urgent    Referral Type:   Consultation    Referral Reason:   Specialty Services Required    Requested Specialty:   Hospice Services    Number of Visits Requested:   1    All questions were answered. The patient knows to call the clinic with any problems, questions or concerns. No barriers to learning was detected. The total time spent in the appointment was 40 minutes.     Truitt Merle, MD 04/12/2021   I, Wilburn Mylar, am acting as scribe for Truitt Merle, MD.   I have reviewed the above documentation for accuracy and completeness, and I agree with the above.

## 2021-04-13 ENCOUNTER — Encounter (HOSPITAL_COMMUNITY): Payer: Self-pay | Admitting: Hematology

## 2021-04-13 ENCOUNTER — Telehealth: Payer: Self-pay

## 2021-04-13 DIAGNOSIS — C50212 Malignant neoplasm of upper-inner quadrant of left female breast: Secondary | ICD-10-CM | POA: Diagnosis not present

## 2021-04-13 DIAGNOSIS — C7951 Secondary malignant neoplasm of bone: Secondary | ICD-10-CM | POA: Diagnosis not present

## 2021-04-13 DIAGNOSIS — I10 Essential (primary) hypertension: Secondary | ICD-10-CM | POA: Diagnosis not present

## 2021-04-13 DIAGNOSIS — C7970 Secondary malignant neoplasm of unspecified adrenal gland: Secondary | ICD-10-CM | POA: Diagnosis not present

## 2021-04-13 DIAGNOSIS — Z87891 Personal history of nicotine dependence: Secondary | ICD-10-CM | POA: Diagnosis not present

## 2021-04-13 DIAGNOSIS — C3490 Malignant neoplasm of unspecified part of unspecified bronchus or lung: Secondary | ICD-10-CM | POA: Diagnosis not present

## 2021-04-13 DIAGNOSIS — J449 Chronic obstructive pulmonary disease, unspecified: Secondary | ICD-10-CM | POA: Diagnosis not present

## 2021-04-13 DIAGNOSIS — Z171 Estrogen receptor negative status [ER-]: Secondary | ICD-10-CM | POA: Diagnosis not present

## 2021-04-13 DIAGNOSIS — C7889 Secondary malignant neoplasm of other digestive organs: Secondary | ICD-10-CM | POA: Diagnosis not present

## 2021-04-13 DIAGNOSIS — C3412 Malignant neoplasm of upper lobe, left bronchus or lung: Secondary | ICD-10-CM | POA: Diagnosis not present

## 2021-04-13 NOTE — Telephone Encounter (Signed)
Spoke with Alwyn Ren at [facility] [phone]) regarding expedited referral to hospice for pt. Alwyn Ren states she has escalated this to her director to hopefully see pt today or sometime this weekend. MD aware.

## 2021-04-14 DIAGNOSIS — C7970 Secondary malignant neoplasm of unspecified adrenal gland: Secondary | ICD-10-CM | POA: Diagnosis not present

## 2021-04-14 DIAGNOSIS — C7889 Secondary malignant neoplasm of other digestive organs: Secondary | ICD-10-CM | POA: Diagnosis not present

## 2021-04-14 DIAGNOSIS — C7951 Secondary malignant neoplasm of bone: Secondary | ICD-10-CM | POA: Diagnosis not present

## 2021-04-14 DIAGNOSIS — Z171 Estrogen receptor negative status [ER-]: Secondary | ICD-10-CM | POA: Diagnosis not present

## 2021-04-14 DIAGNOSIS — C3412 Malignant neoplasm of upper lobe, left bronchus or lung: Secondary | ICD-10-CM | POA: Diagnosis not present

## 2021-04-14 DIAGNOSIS — C50212 Malignant neoplasm of upper-inner quadrant of left female breast: Secondary | ICD-10-CM | POA: Diagnosis not present

## 2021-04-15 DIAGNOSIS — C3412 Malignant neoplasm of upper lobe, left bronchus or lung: Secondary | ICD-10-CM | POA: Diagnosis not present

## 2021-04-15 DIAGNOSIS — C7970 Secondary malignant neoplasm of unspecified adrenal gland: Secondary | ICD-10-CM | POA: Diagnosis not present

## 2021-04-15 DIAGNOSIS — C7951 Secondary malignant neoplasm of bone: Secondary | ICD-10-CM | POA: Diagnosis not present

## 2021-04-15 DIAGNOSIS — C50212 Malignant neoplasm of upper-inner quadrant of left female breast: Secondary | ICD-10-CM | POA: Diagnosis not present

## 2021-04-15 DIAGNOSIS — C7889 Secondary malignant neoplasm of other digestive organs: Secondary | ICD-10-CM | POA: Diagnosis not present

## 2021-04-15 DIAGNOSIS — Z171 Estrogen receptor negative status [ER-]: Secondary | ICD-10-CM | POA: Diagnosis not present

## 2021-04-16 DIAGNOSIS — C7951 Secondary malignant neoplasm of bone: Secondary | ICD-10-CM | POA: Diagnosis not present

## 2021-04-16 DIAGNOSIS — I10 Essential (primary) hypertension: Secondary | ICD-10-CM | POA: Diagnosis not present

## 2021-04-16 DIAGNOSIS — C50212 Malignant neoplasm of upper-inner quadrant of left female breast: Secondary | ICD-10-CM | POA: Diagnosis not present

## 2021-04-16 DIAGNOSIS — J449 Chronic obstructive pulmonary disease, unspecified: Secondary | ICD-10-CM | POA: Diagnosis not present

## 2021-04-16 DIAGNOSIS — C3412 Malignant neoplasm of upper lobe, left bronchus or lung: Secondary | ICD-10-CM | POA: Diagnosis not present

## 2021-04-16 DIAGNOSIS — Z171 Estrogen receptor negative status [ER-]: Secondary | ICD-10-CM | POA: Diagnosis not present

## 2021-04-16 DIAGNOSIS — C7970 Secondary malignant neoplasm of unspecified adrenal gland: Secondary | ICD-10-CM | POA: Diagnosis not present

## 2021-04-16 DIAGNOSIS — C7889 Secondary malignant neoplasm of other digestive organs: Secondary | ICD-10-CM | POA: Diagnosis not present

## 2021-04-16 DIAGNOSIS — Z87891 Personal history of nicotine dependence: Secondary | ICD-10-CM | POA: Diagnosis not present

## 2021-04-17 ENCOUNTER — Encounter (HOSPITAL_COMMUNITY): Payer: Self-pay

## 2021-04-17 DIAGNOSIS — C7951 Secondary malignant neoplasm of bone: Secondary | ICD-10-CM | POA: Diagnosis not present

## 2021-04-17 DIAGNOSIS — Z171 Estrogen receptor negative status [ER-]: Secondary | ICD-10-CM | POA: Diagnosis not present

## 2021-04-17 DIAGNOSIS — C50212 Malignant neoplasm of upper-inner quadrant of left female breast: Secondary | ICD-10-CM | POA: Diagnosis not present

## 2021-04-17 DIAGNOSIS — C7889 Secondary malignant neoplasm of other digestive organs: Secondary | ICD-10-CM | POA: Diagnosis not present

## 2021-04-17 DIAGNOSIS — C3412 Malignant neoplasm of upper lobe, left bronchus or lung: Secondary | ICD-10-CM | POA: Diagnosis not present

## 2021-04-17 DIAGNOSIS — C7970 Secondary malignant neoplasm of unspecified adrenal gland: Secondary | ICD-10-CM | POA: Diagnosis not present

## 2021-04-17 LAB — GUARDANT 360

## 2021-04-18 DIAGNOSIS — C50212 Malignant neoplasm of upper-inner quadrant of left female breast: Secondary | ICD-10-CM | POA: Diagnosis not present

## 2021-04-18 DIAGNOSIS — C3412 Malignant neoplasm of upper lobe, left bronchus or lung: Secondary | ICD-10-CM | POA: Diagnosis not present

## 2021-04-18 DIAGNOSIS — C7970 Secondary malignant neoplasm of unspecified adrenal gland: Secondary | ICD-10-CM | POA: Diagnosis not present

## 2021-04-18 DIAGNOSIS — C7951 Secondary malignant neoplasm of bone: Secondary | ICD-10-CM | POA: Diagnosis not present

## 2021-04-18 DIAGNOSIS — C7889 Secondary malignant neoplasm of other digestive organs: Secondary | ICD-10-CM | POA: Diagnosis not present

## 2021-04-18 DIAGNOSIS — Z171 Estrogen receptor negative status [ER-]: Secondary | ICD-10-CM | POA: Diagnosis not present

## 2021-04-19 DIAGNOSIS — Z171 Estrogen receptor negative status [ER-]: Secondary | ICD-10-CM | POA: Diagnosis not present

## 2021-04-19 DIAGNOSIS — C7951 Secondary malignant neoplasm of bone: Secondary | ICD-10-CM | POA: Diagnosis not present

## 2021-04-19 DIAGNOSIS — C7970 Secondary malignant neoplasm of unspecified adrenal gland: Secondary | ICD-10-CM | POA: Diagnosis not present

## 2021-04-19 DIAGNOSIS — C50212 Malignant neoplasm of upper-inner quadrant of left female breast: Secondary | ICD-10-CM | POA: Diagnosis not present

## 2021-04-19 DIAGNOSIS — C3412 Malignant neoplasm of upper lobe, left bronchus or lung: Secondary | ICD-10-CM | POA: Diagnosis not present

## 2021-04-19 DIAGNOSIS — C7889 Secondary malignant neoplasm of other digestive organs: Secondary | ICD-10-CM | POA: Diagnosis not present

## 2021-04-20 DIAGNOSIS — C7889 Secondary malignant neoplasm of other digestive organs: Secondary | ICD-10-CM | POA: Diagnosis not present

## 2021-04-20 DIAGNOSIS — C3412 Malignant neoplasm of upper lobe, left bronchus or lung: Secondary | ICD-10-CM | POA: Diagnosis not present

## 2021-04-20 DIAGNOSIS — C7970 Secondary malignant neoplasm of unspecified adrenal gland: Secondary | ICD-10-CM | POA: Diagnosis not present

## 2021-04-20 DIAGNOSIS — C50212 Malignant neoplasm of upper-inner quadrant of left female breast: Secondary | ICD-10-CM | POA: Diagnosis not present

## 2021-04-20 DIAGNOSIS — Z171 Estrogen receptor negative status [ER-]: Secondary | ICD-10-CM | POA: Diagnosis not present

## 2021-04-20 DIAGNOSIS — C7951 Secondary malignant neoplasm of bone: Secondary | ICD-10-CM | POA: Diagnosis not present

## 2021-04-21 DIAGNOSIS — Z171 Estrogen receptor negative status [ER-]: Secondary | ICD-10-CM | POA: Diagnosis not present

## 2021-04-21 DIAGNOSIS — C7951 Secondary malignant neoplasm of bone: Secondary | ICD-10-CM | POA: Diagnosis not present

## 2021-04-21 DIAGNOSIS — C7970 Secondary malignant neoplasm of unspecified adrenal gland: Secondary | ICD-10-CM | POA: Diagnosis not present

## 2021-04-21 DIAGNOSIS — C7889 Secondary malignant neoplasm of other digestive organs: Secondary | ICD-10-CM | POA: Diagnosis not present

## 2021-04-21 DIAGNOSIS — C50212 Malignant neoplasm of upper-inner quadrant of left female breast: Secondary | ICD-10-CM | POA: Diagnosis not present

## 2021-04-21 DIAGNOSIS — C3412 Malignant neoplasm of upper lobe, left bronchus or lung: Secondary | ICD-10-CM | POA: Diagnosis not present

## 2021-04-22 DIAGNOSIS — C7970 Secondary malignant neoplasm of unspecified adrenal gland: Secondary | ICD-10-CM | POA: Diagnosis not present

## 2021-04-22 DIAGNOSIS — C3412 Malignant neoplasm of upper lobe, left bronchus or lung: Secondary | ICD-10-CM | POA: Diagnosis not present

## 2021-04-22 DIAGNOSIS — C50212 Malignant neoplasm of upper-inner quadrant of left female breast: Secondary | ICD-10-CM | POA: Diagnosis not present

## 2021-04-22 DIAGNOSIS — Z171 Estrogen receptor negative status [ER-]: Secondary | ICD-10-CM | POA: Diagnosis not present

## 2021-04-22 DIAGNOSIS — C7889 Secondary malignant neoplasm of other digestive organs: Secondary | ICD-10-CM | POA: Diagnosis not present

## 2021-04-22 DIAGNOSIS — C7951 Secondary malignant neoplasm of bone: Secondary | ICD-10-CM | POA: Diagnosis not present

## 2021-04-23 DIAGNOSIS — C7889 Secondary malignant neoplasm of other digestive organs: Secondary | ICD-10-CM | POA: Diagnosis not present

## 2021-04-23 DIAGNOSIS — C3412 Malignant neoplasm of upper lobe, left bronchus or lung: Secondary | ICD-10-CM | POA: Diagnosis not present

## 2021-04-23 DIAGNOSIS — Z171 Estrogen receptor negative status [ER-]: Secondary | ICD-10-CM | POA: Diagnosis not present

## 2021-04-23 DIAGNOSIS — C7951 Secondary malignant neoplasm of bone: Secondary | ICD-10-CM | POA: Diagnosis not present

## 2021-04-23 DIAGNOSIS — C7970 Secondary malignant neoplasm of unspecified adrenal gland: Secondary | ICD-10-CM | POA: Diagnosis not present

## 2021-04-23 DIAGNOSIS — C50212 Malignant neoplasm of upper-inner quadrant of left female breast: Secondary | ICD-10-CM | POA: Diagnosis not present

## 2021-04-24 ENCOUNTER — Ambulatory Visit (HOSPITAL_COMMUNITY): Payer: Medicare Other

## 2021-04-24 DIAGNOSIS — C7889 Secondary malignant neoplasm of other digestive organs: Secondary | ICD-10-CM | POA: Diagnosis not present

## 2021-04-24 DIAGNOSIS — C50212 Malignant neoplasm of upper-inner quadrant of left female breast: Secondary | ICD-10-CM | POA: Diagnosis not present

## 2021-04-24 DIAGNOSIS — Z171 Estrogen receptor negative status [ER-]: Secondary | ICD-10-CM | POA: Diagnosis not present

## 2021-04-24 DIAGNOSIS — C7951 Secondary malignant neoplasm of bone: Secondary | ICD-10-CM | POA: Diagnosis not present

## 2021-04-24 DIAGNOSIS — C7970 Secondary malignant neoplasm of unspecified adrenal gland: Secondary | ICD-10-CM | POA: Diagnosis not present

## 2021-04-24 DIAGNOSIS — C3412 Malignant neoplasm of upper lobe, left bronchus or lung: Secondary | ICD-10-CM | POA: Diagnosis not present

## 2021-04-25 DIAGNOSIS — C7951 Secondary malignant neoplasm of bone: Secondary | ICD-10-CM | POA: Diagnosis not present

## 2021-04-25 DIAGNOSIS — C3412 Malignant neoplasm of upper lobe, left bronchus or lung: Secondary | ICD-10-CM | POA: Diagnosis not present

## 2021-04-25 DIAGNOSIS — C7970 Secondary malignant neoplasm of unspecified adrenal gland: Secondary | ICD-10-CM | POA: Diagnosis not present

## 2021-04-25 DIAGNOSIS — Z171 Estrogen receptor negative status [ER-]: Secondary | ICD-10-CM | POA: Diagnosis not present

## 2021-04-25 DIAGNOSIS — C50212 Malignant neoplasm of upper-inner quadrant of left female breast: Secondary | ICD-10-CM | POA: Diagnosis not present

## 2021-04-25 DIAGNOSIS — C7889 Secondary malignant neoplasm of other digestive organs: Secondary | ICD-10-CM | POA: Diagnosis not present

## 2021-05-04 ENCOUNTER — Encounter: Payer: Self-pay | Admitting: Hematology

## 2021-05-04 NOTE — Telephone Encounter (Signed)
Chart Review only

## 2021-05-17 DEATH — deceased

## 2022-01-24 ENCOUNTER — Encounter (HOSPITAL_COMMUNITY): Payer: Self-pay

## 2023-02-16 IMAGING — US US BIOPSY LYMPH NODE
1 series · 13 of 18 positions shown · non-contrast
Comparison: none

INDICATION: 69-year-old female with a history of likely lung cancer recurrence
referred for lymph node biopsy

[Series 1: us core biopsy (lymph nodes) · 13 of 18 slices shown]
[im 1/18]
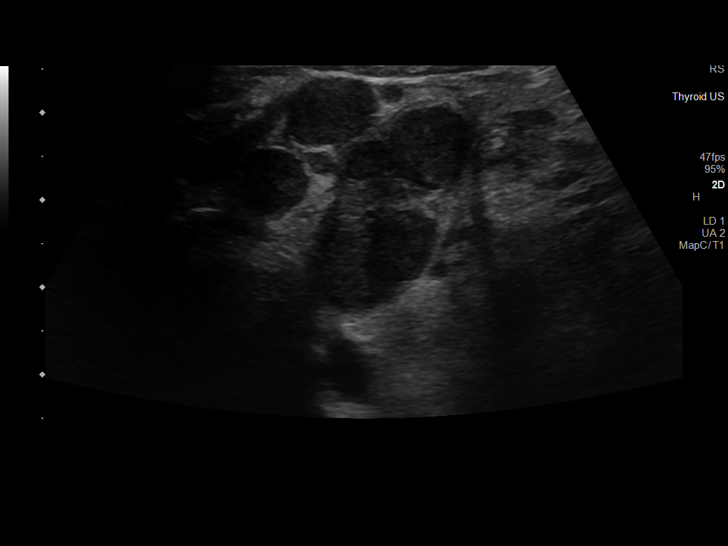
[im 3/18]
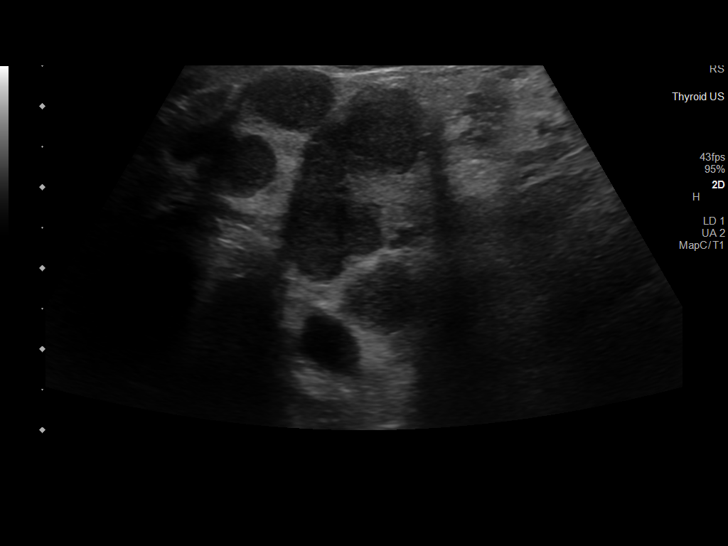
[im 4/18]
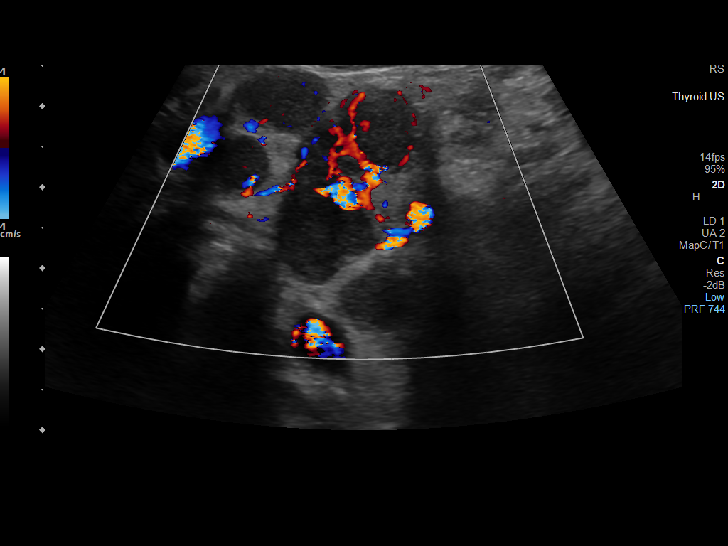
[im 5/18]
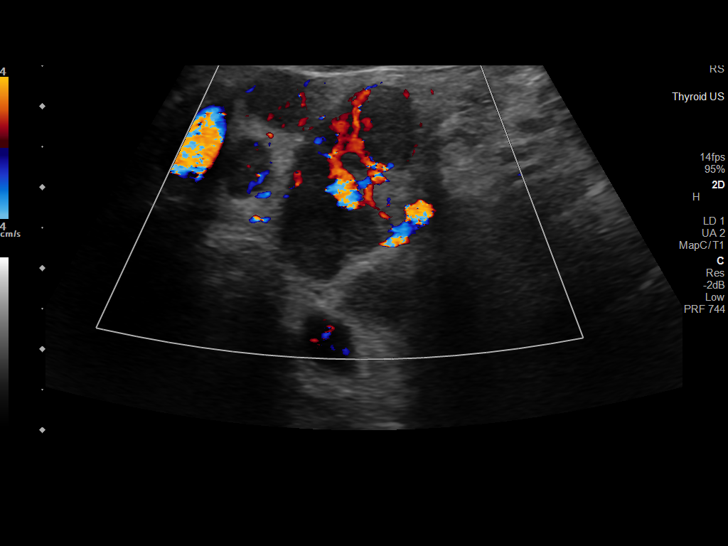
[im 7/18]
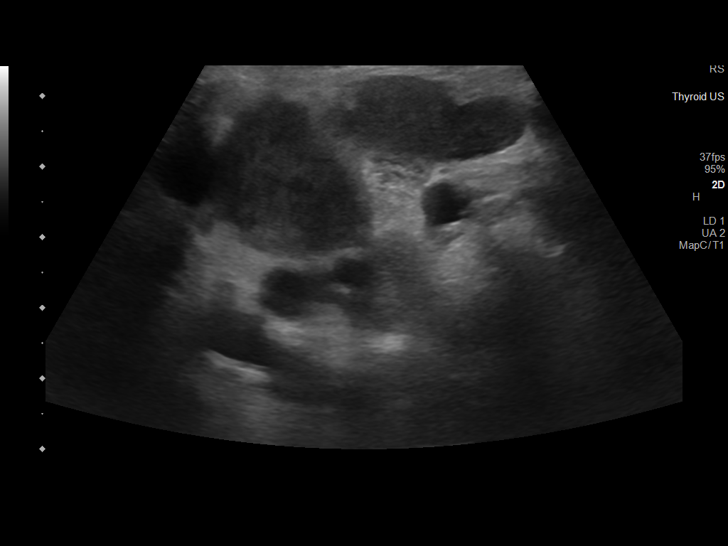
[im 8/18]
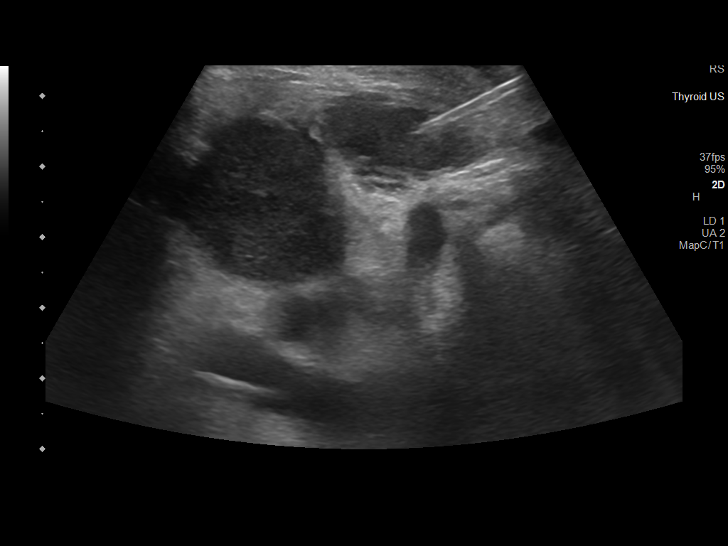
[im 10/18]
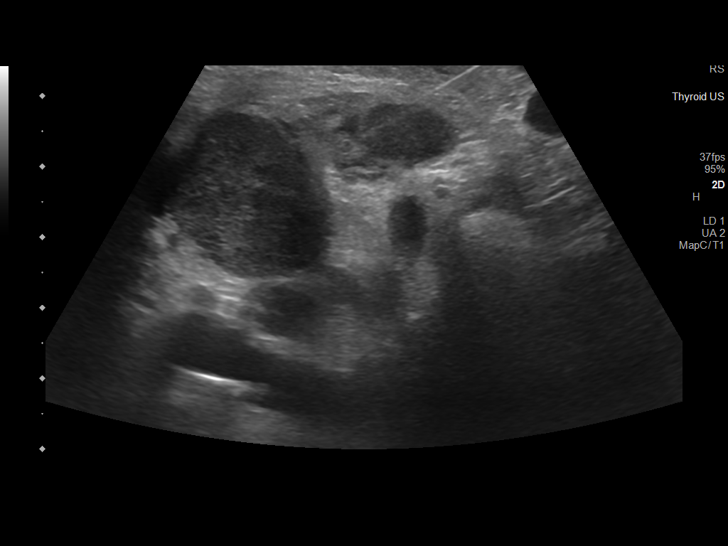
[im 11/18]
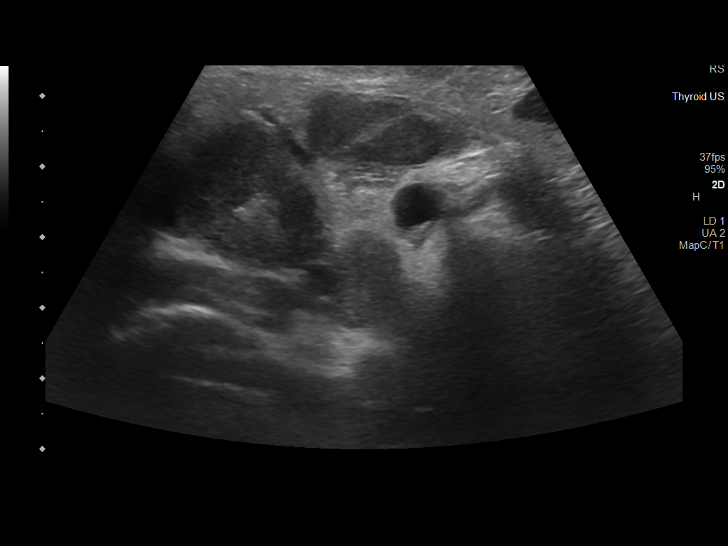
[im 12/18]
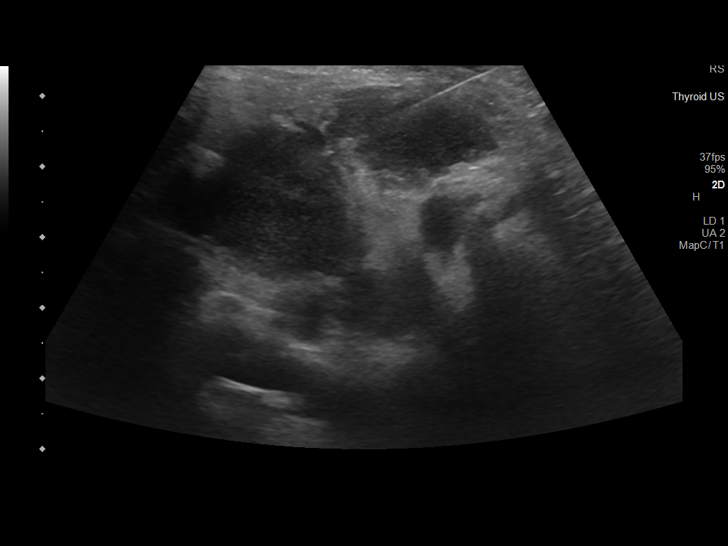
[im 14/18]
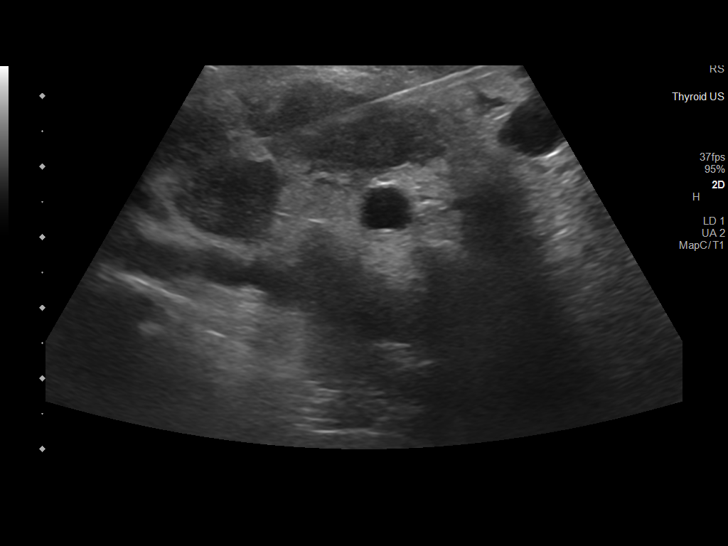
[im 15/18]
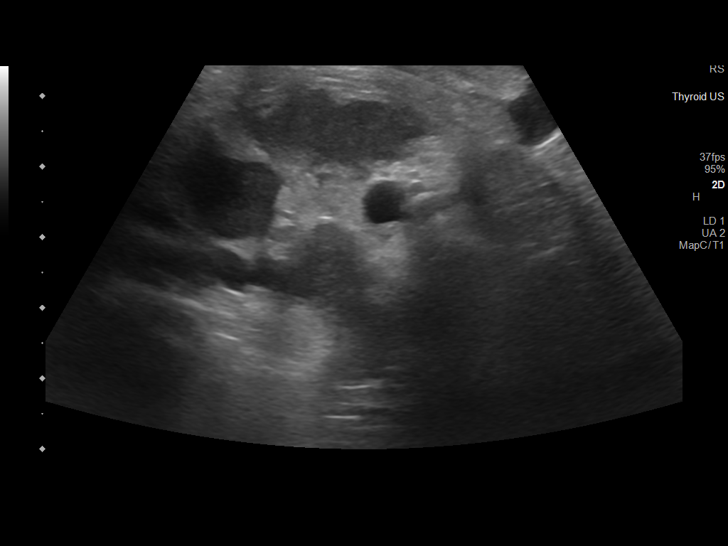
[im 16/18]
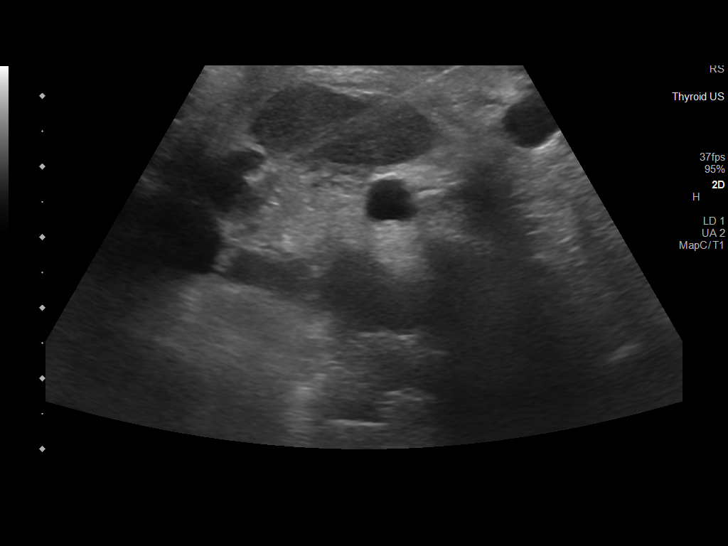
[im 18/18]
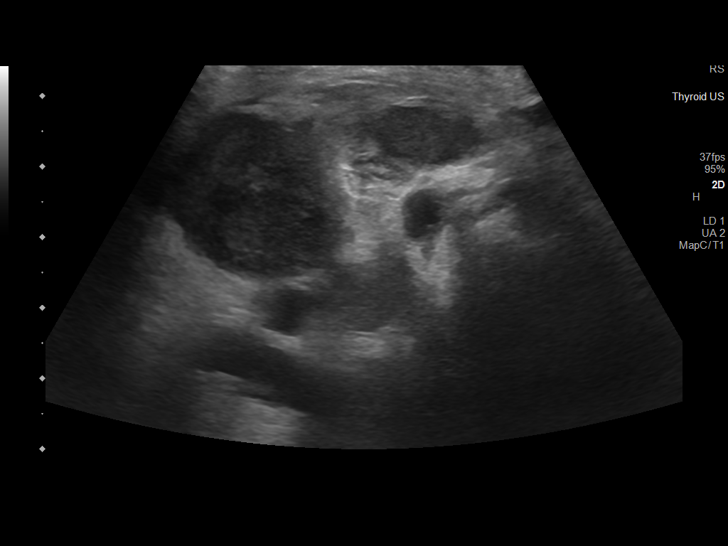

[13 of 18 positions shown; findings below may reference images not displayed]

EXAM:
ULTRASOUND-GUIDED BIOPSY LEFT SUPRACLAVICULAR NODE

MEDICATIONS:
None.

ANESTHESIA/SEDATION:
Moderate (conscious) sedation was employed during this procedure. A
total of Versed 1.0 mg and Fentanyl 50 mcg was administered
intravenously.

Moderate Sedation Time: 11 minutes. The patient's level of
consciousness and vital signs were monitored continuously by
radiology nursing throughout the procedure under my direct
supervision.

FLUOROSCOPY TIME:  Ultrasound

COMPLICATIONS:
None

PROCEDURE:
Informed written consent was obtained from the patient after a
thorough discussion of the procedural risks, benefits and
alternatives. All questions were addressed. Maximal Sterile Barrier
Technique was utilized including caps, mask, sterile gowns, sterile
gloves, sterile drape, hand hygiene and skin antiseptic. A timeout
was performed prior to the initiation of the procedure

Ultrasound survey was performed with images stored and sent to PACs.

The left neck was prepped with chlorhexidine in a sterile fashion,
and a sterile drape was applied covering the operative field. A
sterile gown and sterile gloves were used for the procedure. Local
anesthesia was provided with 1% Lidocaine.

Ultrasound guidance was used to infiltrate the region with 1%
lidocaine for local anesthesia. Ultrasound guidance was used to take
multiple 18 gauge core biopsy of the supraclavicular nodal mass.
Images were stored.

Tissue placed into saline.

Final image was stored after biopsy.

Patient tolerated the procedure well and remained hemodynamically
stable throughout.

No complications were encountered and no significant blood loss was
encounter
IMPRESSION: Status post ultrasound-guided biopsy left supraclavicular lymph
nodal mass.

## 2023-03-27 IMAGING — MR MR HEAD WO/W CM
14 series · 48 of 48 positions shown · IV contrast (gadavist)
Comparison: None.

CLINICAL DATA: Non-small cell lung cancer.  Staging.

EXAM:
MRI HEAD WITHOUT AND WITH CONTRAST
TECHNIQUE: Multiplanar, multiecho pulse sequences of the brain and surrounding
structures were obtained without and with intravenous contrast.
CONTRAST:  5mL GADAVIST GADOBUTROL 1 MMOL/ML IV SOLN

[Series 5: DWI · axial · 3.0mm · 1.36mm/px · z∈[+21,+159]mm · 6 of 102 slices shown (1 of 2)]
[im 1/102]
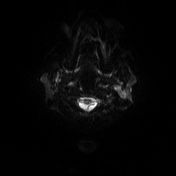
[im 21/102]
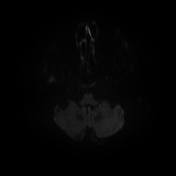
[im 41/102]
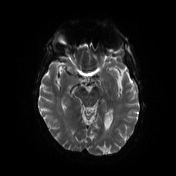
[im 61/102]
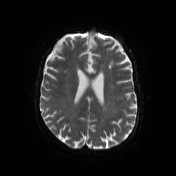
[im 81/102]
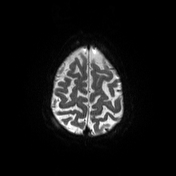
[im 102/102]
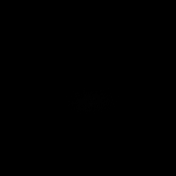

[Series 6: DWI · axial · 3.0mm · 1.36mm/px · z∈[+21,+154]mm · 3 of 50 slices shown (2 of 2)]
[im 1/50]
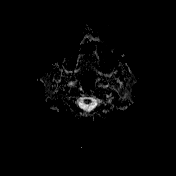
[im 25/50]
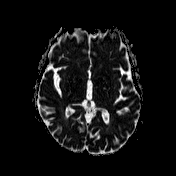
[im 50/50]
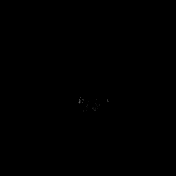

[Series 7: T1 · sagittal · 5.0mm · 0.75mm/px · 1 of 24 slices shown (1 of 2)]
[im 1/24]
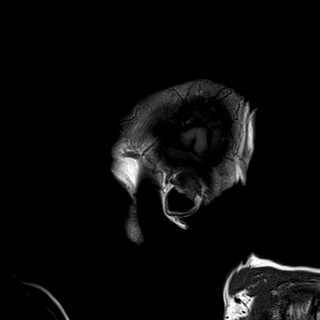

[Series 8: T2 · axial · 5.0mm · 0.62mm/px · 1 of 24 slices shown]
[im 1/24]
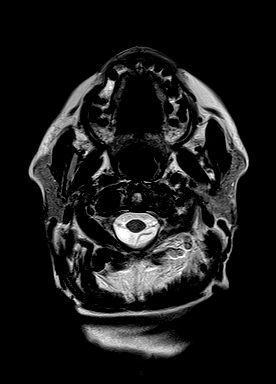

[Series 9: mip_images(sw) · axial · 24.0mm · 0.75mm/px · z∈[+22,+152]mm · 3 of 49 slices shown]
[im 1/49]
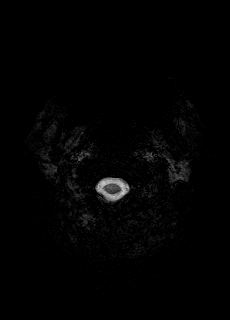
[im 25/49]
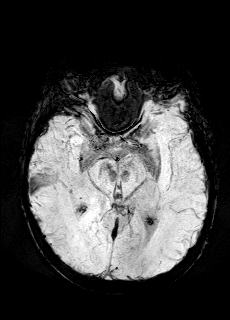
[im 49/49]
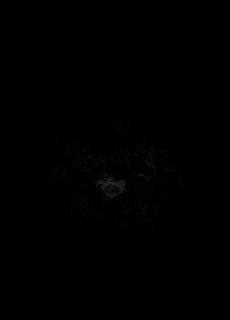

[Series 10: swi_images · axial · 3.0mm · 0.75mm/px · z∈[+12,+162]mm · 3 of 56 slices shown]
[im 1/56]
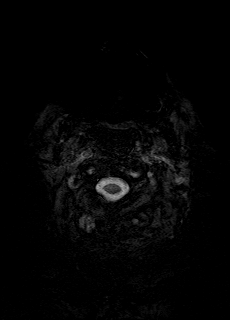
[im 28/56]
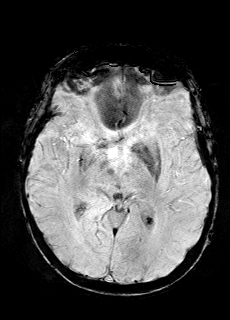
[im 56/56]
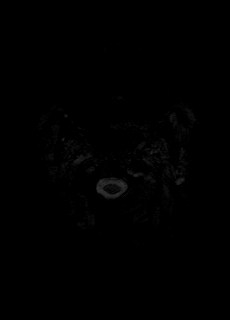

[Series 11: FLAIR · axial · 3.0mm · 0.75mm/px · z∈[+18,+156]mm · 3 of 52 slices shown]
[im 1/52]
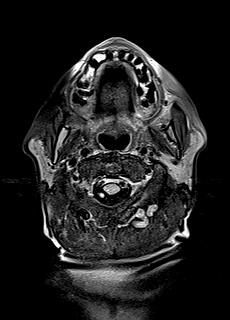
[im 26/52]
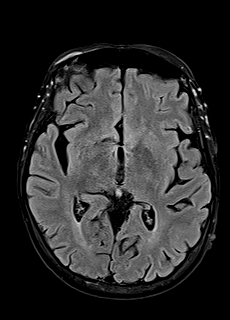
[im 52/52]
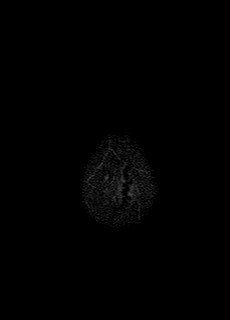

[Series 12: T1 · axial · 1.0mm · 0.94mm/px · z∈[+15,+159]mm · 9 of 160 slices shown (2 of 2)]
[im 1/160]
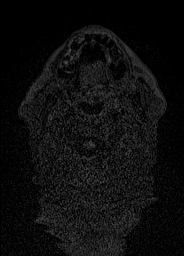
[im 20/160]
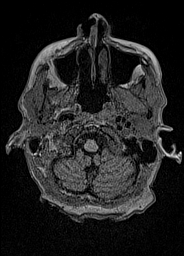
[im 40/160]
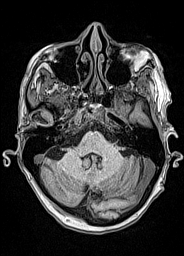
[im 60/160]
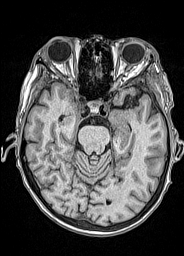
[im 80/160]
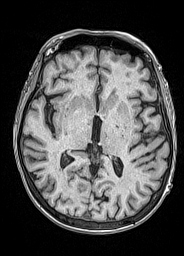
[im 100/160]
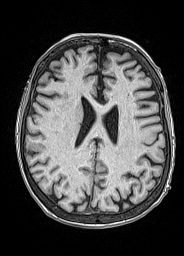
[im 120/160]
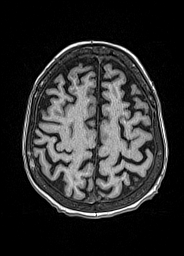
[im 140/160]
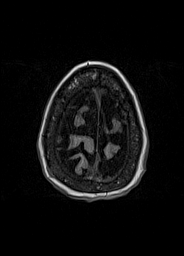
[im 160/160]
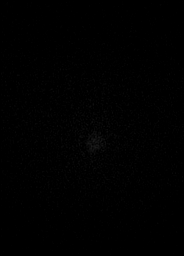

[Series 13: cor dwi_tracew · coronal · 5.0mm · 1.53mm/px · 3 of 54 slices shown]
[im 1/54]
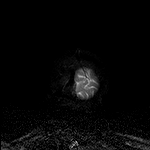
[im 27/54]
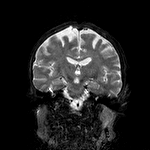
[im 54/54]
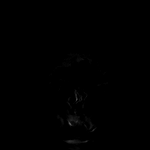

[Series 14: cor dwi_adc · coronal · 5.0mm · 1.53mm/px · 2 of 27 slices shown]
[im 1/27]
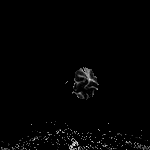
[im 27/27]
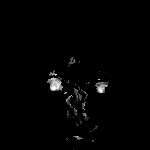

[Series 15: T2 post-contrast · coronal · 5.0mm · 0.57mm/px · 2 of 28 slices shown]
[im 1/28]
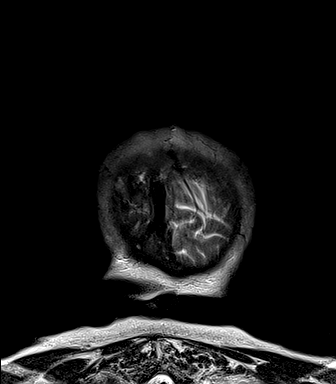
[im 28/28]
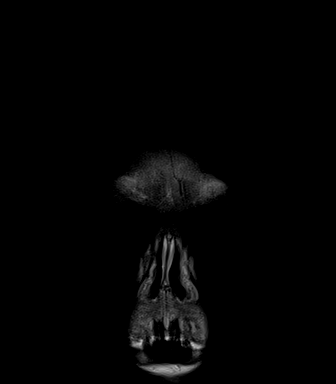

[Series 16: T1 post-contrast · axial · 1.0mm · 0.94mm/px · z∈[+15,+159]mm · 9 of 160 slices shown (1 of 3)]
[im 1/160]
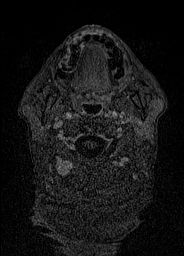
[im 20/160]
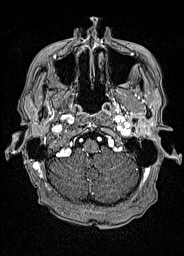
[im 40/160]
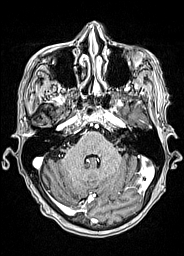
[im 60/160]
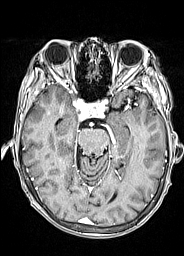
[im 80/160]
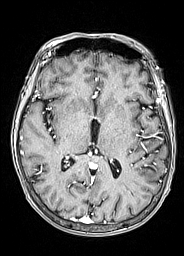
[im 100/160]
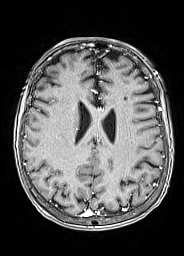
[im 120/160]
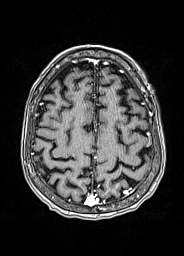
[im 140/160]
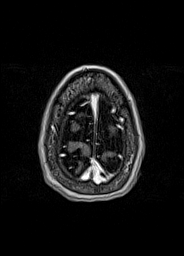
[im 160/160]
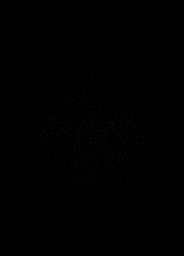

[Series 17: T1 post-contrast · coronal · 5.0mm · 0.43mm/px · 2 of 28 slices shown (2 of 3)]
[im 1/28]
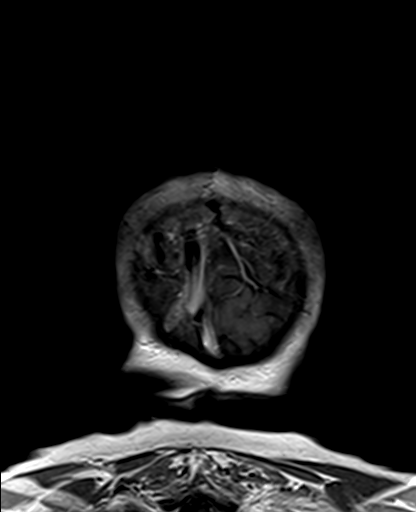
[im 28/28]
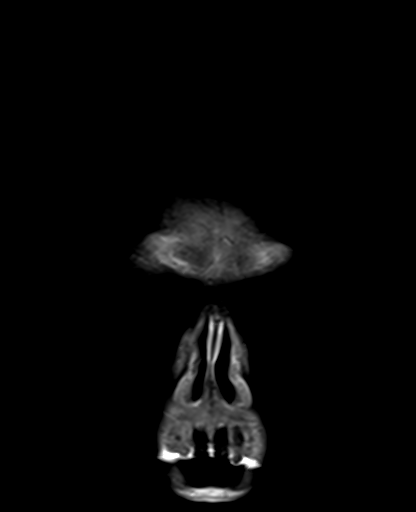

[Series 18: T1 post-contrast · sagittal · 5.0mm · 0.75mm/px · 1 of 24 slices shown (3 of 3)]
[im 1/24]
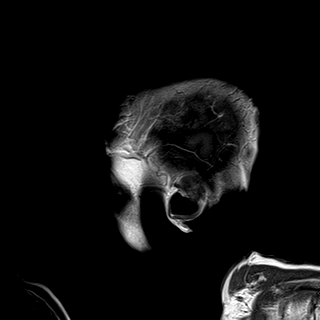

[48 of 48 positions shown; findings below may reference images not displayed]

FINDINGS: Brain: No acute infarction, hemorrhage, hydrocephalus, extra-axial
collection or mass lesion. A few scattered foci of T2 hyperintensity
are seen within the white matter of the cerebral hemispheres,
nonspecific, most likely related to chronic small vessel ischemia.
No focus of abnormal contrast enhancement.

Vascular: Normal flow voids.

Skull and upper cervical spine: Exaggerated cervical lordosis.
Diffuse decrease of the T1 signal of the bone marrow of the
visualized spine. A 7 mm rim enhancing lesion in the right parietal
bone.

Sinuses/Orbits: Bilateral lens surgery. Paranasal sinuses are clear.

Other: A 2.4 x 1.5 cm heterogeneously enhancing lesion within the
deep lobe of the left parotid gland.
IMPRESSION: 1. No evidence of intracranial metastatic disease.
2. A 7 mm rim enhancing lesion in the right parietal bone may
represent bone metastasis.
3. A 2.4 heterogeneously enhancing lesion within the deep lobe of
the left parotid gland may represent metastasis versus primary
parotid neoplasm.
4. Diffuse decrease of the T1 signal within the visualized spine may
represent red marrow reconversion. However, marrow replacement
pathologies cannot be entirely excluded.
